# Patient Record
Sex: Male | Born: 1950 | Race: White | Hispanic: No | Marital: Married | Smoking: Never smoker
Health system: Southern US, Community
[De-identification: ages and names within clinical notes are randomized; demographics above are authoritative.]

## PROBLEM LIST (undated history)

## (undated) DIAGNOSIS — Z87442 Personal history of urinary calculi: Secondary | ICD-10-CM

## (undated) DIAGNOSIS — K219 Gastro-esophageal reflux disease without esophagitis: Secondary | ICD-10-CM

## (undated) DIAGNOSIS — B958 Unspecified staphylococcus as the cause of diseases classified elsewhere: Secondary | ICD-10-CM

## (undated) DIAGNOSIS — F32A Depression, unspecified: Secondary | ICD-10-CM

## (undated) DIAGNOSIS — C189 Malignant neoplasm of colon, unspecified: Secondary | ICD-10-CM

## (undated) DIAGNOSIS — T4145XA Adverse effect of unspecified anesthetic, initial encounter: Secondary | ICD-10-CM

## (undated) DIAGNOSIS — F329 Major depressive disorder, single episode, unspecified: Secondary | ICD-10-CM

## (undated) DIAGNOSIS — D649 Anemia, unspecified: Secondary | ICD-10-CM

## (undated) DIAGNOSIS — T8859XA Other complications of anesthesia, initial encounter: Secondary | ICD-10-CM

## (undated) DIAGNOSIS — N400 Enlarged prostate without lower urinary tract symptoms: Secondary | ICD-10-CM

## (undated) DIAGNOSIS — G4730 Sleep apnea, unspecified: Secondary | ICD-10-CM

## (undated) DIAGNOSIS — M1990 Unspecified osteoarthritis, unspecified site: Secondary | ICD-10-CM

## (undated) HISTORY — PX: CARDIAC CATHETERIZATION: SHX172

## (undated) HISTORY — DX: Unspecified staphylococcus as the cause of diseases classified elsewhere: B95.8

## (undated) HISTORY — PX: HERNIA REPAIR: SHX51

## (undated) HISTORY — PX: APPENDECTOMY: SHX54

## (undated) HISTORY — DX: Major depressive disorder, single episode, unspecified: F32.9

## (undated) HISTORY — PX: SKIN LESION EXCISION: SHX2412

## (undated) HISTORY — DX: Benign prostatic hyperplasia without lower urinary tract symptoms: N40.0

## (undated) HISTORY — DX: Depression, unspecified: F32.A

## (undated) HISTORY — PX: KIDNEY STONE SURGERY: SHX686

## (undated) HISTORY — DX: Sleep apnea, unspecified: G47.30

## (undated) HISTORY — DX: Malignant neoplasm of colon, unspecified: C18.9

---

## 1974-12-20 HISTORY — PX: PILONIDAL CYST / SINUS EXCISION: SUR543

## 2004-02-13 ENCOUNTER — Inpatient Hospital Stay (HOSPITAL_BASED_OUTPATIENT_CLINIC_OR_DEPARTMENT_OTHER): Admission: RE | Admit: 2004-02-13 | Discharge: 2004-02-13 | Payer: Self-pay | Admitting: Cardiology

## 2005-09-15 ENCOUNTER — Ambulatory Visit (HOSPITAL_COMMUNITY): Admission: RE | Admit: 2005-09-15 | Discharge: 2005-09-15 | Payer: Self-pay | Admitting: Unknown Physician Specialty

## 2005-12-20 HISTORY — PX: COLECTOMY: SHX59

## 2006-04-18 ENCOUNTER — Encounter (INDEPENDENT_AMBULATORY_CARE_PROVIDER_SITE_OTHER): Payer: Self-pay | Admitting: *Deleted

## 2006-04-18 ENCOUNTER — Ambulatory Visit: Payer: Self-pay | Admitting: Internal Medicine

## 2006-04-18 ENCOUNTER — Ambulatory Visit (HOSPITAL_COMMUNITY): Admission: RE | Admit: 2006-04-18 | Discharge: 2006-04-18 | Payer: Self-pay | Admitting: Internal Medicine

## 2006-04-19 ENCOUNTER — Encounter (INDEPENDENT_AMBULATORY_CARE_PROVIDER_SITE_OTHER): Payer: Self-pay | Admitting: *Deleted

## 2006-04-19 ENCOUNTER — Inpatient Hospital Stay (HOSPITAL_COMMUNITY): Admission: RE | Admit: 2006-04-19 | Discharge: 2006-04-24 | Payer: Self-pay | Admitting: General Surgery

## 2006-05-13 ENCOUNTER — Encounter (HOSPITAL_COMMUNITY): Admission: RE | Admit: 2006-05-13 | Discharge: 2006-06-12 | Payer: Self-pay | Admitting: Oncology

## 2006-05-13 ENCOUNTER — Ambulatory Visit (HOSPITAL_COMMUNITY): Payer: Self-pay | Admitting: Oncology

## 2006-05-13 ENCOUNTER — Encounter: Admission: RE | Admit: 2006-05-13 | Discharge: 2006-05-13 | Payer: Self-pay | Admitting: Oncology

## 2006-08-09 ENCOUNTER — Ambulatory Visit (HOSPITAL_COMMUNITY): Payer: Self-pay | Admitting: Oncology

## 2006-08-09 ENCOUNTER — Encounter: Admission: RE | Admit: 2006-08-09 | Discharge: 2006-08-09 | Payer: Self-pay | Admitting: Oncology

## 2006-08-09 ENCOUNTER — Encounter (HOSPITAL_COMMUNITY): Admission: RE | Admit: 2006-08-09 | Discharge: 2006-09-08 | Payer: Self-pay | Admitting: Oncology

## 2006-10-03 ENCOUNTER — Encounter: Admission: RE | Admit: 2006-10-03 | Discharge: 2006-10-03 | Payer: Self-pay | Admitting: Oncology

## 2006-10-03 ENCOUNTER — Encounter (HOSPITAL_COMMUNITY): Admission: RE | Admit: 2006-10-03 | Discharge: 2006-11-02 | Payer: Self-pay | Admitting: Oncology

## 2006-10-03 ENCOUNTER — Ambulatory Visit (HOSPITAL_COMMUNITY): Payer: Self-pay | Admitting: Oncology

## 2006-11-08 ENCOUNTER — Encounter (HOSPITAL_COMMUNITY): Admission: RE | Admit: 2006-11-08 | Discharge: 2006-12-08 | Payer: Self-pay | Admitting: Oncology

## 2006-11-25 ENCOUNTER — Ambulatory Visit (HOSPITAL_COMMUNITY): Payer: Self-pay | Admitting: Oncology

## 2006-12-20 DIAGNOSIS — B958 Unspecified staphylococcus as the cause of diseases classified elsewhere: Secondary | ICD-10-CM

## 2006-12-20 HISTORY — DX: Unspecified staphylococcus as the cause of diseases classified elsewhere: B95.8

## 2007-01-25 ENCOUNTER — Ambulatory Visit (HOSPITAL_COMMUNITY): Payer: Self-pay | Admitting: Oncology

## 2007-01-25 ENCOUNTER — Encounter (HOSPITAL_COMMUNITY): Admission: RE | Admit: 2007-01-25 | Discharge: 2007-02-24 | Payer: Self-pay | Admitting: Oncology

## 2007-05-03 ENCOUNTER — Encounter (INDEPENDENT_AMBULATORY_CARE_PROVIDER_SITE_OTHER): Payer: Self-pay | Admitting: Specialist

## 2007-05-03 ENCOUNTER — Ambulatory Visit: Payer: Self-pay | Admitting: Internal Medicine

## 2007-05-03 ENCOUNTER — Ambulatory Visit (HOSPITAL_COMMUNITY): Admission: RE | Admit: 2007-05-03 | Discharge: 2007-05-03 | Payer: Self-pay | Admitting: Internal Medicine

## 2007-05-18 ENCOUNTER — Encounter (HOSPITAL_COMMUNITY): Admission: RE | Admit: 2007-05-18 | Discharge: 2007-06-17 | Payer: Self-pay | Admitting: Oncology

## 2007-05-18 ENCOUNTER — Ambulatory Visit (HOSPITAL_COMMUNITY): Payer: Self-pay | Admitting: Oncology

## 2007-05-30 ENCOUNTER — Ambulatory Visit: Payer: Self-pay | Admitting: Internal Medicine

## 2007-08-23 ENCOUNTER — Ambulatory Visit (HOSPITAL_COMMUNITY): Payer: Self-pay | Admitting: Oncology

## 2007-08-23 ENCOUNTER — Encounter (HOSPITAL_COMMUNITY): Admission: RE | Admit: 2007-08-23 | Discharge: 2007-09-19 | Payer: Self-pay | Admitting: Oncology

## 2007-09-22 ENCOUNTER — Ambulatory Visit: Payer: Self-pay | Admitting: Gastroenterology

## 2007-10-04 ENCOUNTER — Ambulatory Visit (HOSPITAL_COMMUNITY): Admission: RE | Admit: 2007-10-04 | Discharge: 2007-10-04 | Payer: Self-pay | Admitting: Internal Medicine

## 2007-10-05 ENCOUNTER — Ambulatory Visit (HOSPITAL_COMMUNITY): Admission: RE | Admit: 2007-10-05 | Discharge: 2007-10-05 | Payer: Self-pay | Admitting: Internal Medicine

## 2007-10-10 ENCOUNTER — Ambulatory Visit (HOSPITAL_COMMUNITY): Admission: RE | Admit: 2007-10-10 | Discharge: 2007-10-10 | Payer: Self-pay | Admitting: Internal Medicine

## 2007-11-20 ENCOUNTER — Encounter (HOSPITAL_COMMUNITY): Admission: RE | Admit: 2007-11-20 | Discharge: 2007-12-20 | Payer: Self-pay | Admitting: Oncology

## 2007-11-20 ENCOUNTER — Ambulatory Visit (HOSPITAL_COMMUNITY): Payer: Self-pay | Admitting: Oncology

## 2007-12-07 ENCOUNTER — Ambulatory Visit: Payer: Self-pay | Admitting: Internal Medicine

## 2008-02-12 ENCOUNTER — Ambulatory Visit (HOSPITAL_COMMUNITY): Payer: Self-pay | Admitting: Oncology

## 2008-02-12 ENCOUNTER — Encounter (HOSPITAL_COMMUNITY): Admission: RE | Admit: 2008-02-12 | Discharge: 2008-03-13 | Payer: Self-pay | Admitting: Oncology

## 2008-04-29 ENCOUNTER — Ambulatory Visit (HOSPITAL_COMMUNITY): Payer: Self-pay | Admitting: Oncology

## 2008-04-29 ENCOUNTER — Encounter (HOSPITAL_COMMUNITY): Admission: RE | Admit: 2008-04-29 | Discharge: 2008-05-29 | Payer: Self-pay | Admitting: Oncology

## 2008-06-04 ENCOUNTER — Ambulatory Visit: Payer: Self-pay | Admitting: Internal Medicine

## 2008-07-24 ENCOUNTER — Ambulatory Visit: Payer: Self-pay | Admitting: Internal Medicine

## 2008-07-29 ENCOUNTER — Ambulatory Visit (HOSPITAL_COMMUNITY): Payer: Self-pay | Admitting: Oncology

## 2008-07-29 ENCOUNTER — Encounter (HOSPITAL_COMMUNITY): Admission: RE | Admit: 2008-07-29 | Discharge: 2008-08-28 | Payer: Self-pay | Admitting: Oncology

## 2008-11-20 ENCOUNTER — Encounter (HOSPITAL_COMMUNITY): Admission: RE | Admit: 2008-11-20 | Discharge: 2008-12-18 | Payer: Self-pay | Admitting: Oncology

## 2008-11-20 ENCOUNTER — Ambulatory Visit (HOSPITAL_COMMUNITY): Payer: Self-pay | Admitting: Oncology

## 2009-01-06 ENCOUNTER — Ambulatory Visit: Payer: Self-pay | Admitting: Internal Medicine

## 2009-03-27 ENCOUNTER — Encounter (HOSPITAL_COMMUNITY): Admission: RE | Admit: 2009-03-27 | Discharge: 2009-04-27 | Payer: Self-pay | Admitting: Oncology

## 2009-03-27 ENCOUNTER — Ambulatory Visit (HOSPITAL_COMMUNITY): Payer: Self-pay | Admitting: Oncology

## 2009-06-26 ENCOUNTER — Ambulatory Visit (HOSPITAL_COMMUNITY): Payer: Self-pay | Admitting: Oncology

## 2009-06-26 ENCOUNTER — Encounter (HOSPITAL_COMMUNITY): Admission: RE | Admit: 2009-06-26 | Discharge: 2009-07-26 | Payer: Self-pay | Admitting: Oncology

## 2009-08-14 DIAGNOSIS — E538 Deficiency of other specified B group vitamins: Secondary | ICD-10-CM

## 2009-08-14 DIAGNOSIS — E669 Obesity, unspecified: Secondary | ICD-10-CM

## 2009-08-14 DIAGNOSIS — C189 Malignant neoplasm of colon, unspecified: Secondary | ICD-10-CM

## 2009-08-15 ENCOUNTER — Ambulatory Visit: Payer: Self-pay | Admitting: Internal Medicine

## 2009-09-26 ENCOUNTER — Encounter (HOSPITAL_COMMUNITY): Admission: RE | Admit: 2009-09-26 | Discharge: 2009-10-26 | Payer: Self-pay | Admitting: Oncology

## 2009-09-26 ENCOUNTER — Ambulatory Visit (HOSPITAL_COMMUNITY): Payer: Self-pay | Admitting: Oncology

## 2009-11-19 ENCOUNTER — Ambulatory Visit (HOSPITAL_COMMUNITY): Payer: Self-pay | Admitting: Oncology

## 2009-11-19 ENCOUNTER — Encounter (HOSPITAL_COMMUNITY): Admission: RE | Admit: 2009-11-19 | Discharge: 2009-12-17 | Payer: Self-pay | Admitting: Oncology

## 2009-12-05 ENCOUNTER — Encounter: Payer: Self-pay | Admitting: Internal Medicine

## 2009-12-29 ENCOUNTER — Encounter (HOSPITAL_COMMUNITY): Admission: RE | Admit: 2009-12-29 | Discharge: 2010-01-28 | Payer: Self-pay | Admitting: Oncology

## 2010-01-01 ENCOUNTER — Encounter: Payer: Self-pay | Admitting: Internal Medicine

## 2010-01-07 ENCOUNTER — Ambulatory Visit (HOSPITAL_COMMUNITY): Payer: Self-pay | Admitting: Psychology

## 2010-04-17 ENCOUNTER — Ambulatory Visit: Payer: Self-pay | Admitting: Internal Medicine

## 2010-04-20 DIAGNOSIS — Z8719 Personal history of other diseases of the digestive system: Secondary | ICD-10-CM

## 2010-04-21 ENCOUNTER — Encounter: Payer: Self-pay | Admitting: Internal Medicine

## 2010-04-23 ENCOUNTER — Encounter: Payer: Self-pay | Admitting: Internal Medicine

## 2010-05-11 ENCOUNTER — Ambulatory Visit: Payer: Self-pay | Admitting: Internal Medicine

## 2010-05-11 ENCOUNTER — Ambulatory Visit (HOSPITAL_COMMUNITY): Admission: RE | Admit: 2010-05-11 | Discharge: 2010-05-11 | Payer: Self-pay | Admitting: Internal Medicine

## 2010-05-11 HISTORY — PX: COLONOSCOPY: SHX174

## 2010-05-13 ENCOUNTER — Encounter: Payer: Self-pay | Admitting: Internal Medicine

## 2010-06-16 ENCOUNTER — Telehealth (INDEPENDENT_AMBULATORY_CARE_PROVIDER_SITE_OTHER): Payer: Self-pay

## 2010-08-18 ENCOUNTER — Ambulatory Visit (HOSPITAL_COMMUNITY): Payer: Self-pay | Admitting: Oncology

## 2010-08-18 ENCOUNTER — Encounter (HOSPITAL_COMMUNITY): Admission: RE | Admit: 2010-08-18 | Discharge: 2010-09-17 | Payer: Self-pay | Admitting: Oncology

## 2010-11-18 ENCOUNTER — Ambulatory Visit (HOSPITAL_COMMUNITY): Payer: Self-pay | Admitting: Oncology

## 2010-11-18 ENCOUNTER — Encounter (HOSPITAL_COMMUNITY)
Admission: RE | Admit: 2010-11-18 | Discharge: 2010-12-18 | Payer: Self-pay | Source: Home / Self Care | Attending: Oncology | Admitting: Oncology

## 2010-12-03 ENCOUNTER — Encounter: Payer: Self-pay | Admitting: Internal Medicine

## 2011-01-19 NOTE — Letter (Signed)
Summary: Internal Other /TCS order/instructions  Internal Other /TCS order/instructions   Imported By: Cloria Spring LPN 16/09/9603 54:09:81  _____________________________________________________________________  External Attachment:    Type:   Image     Comment:   External Document

## 2011-01-19 NOTE — Letter (Signed)
Summary: [facility]   Imported By: Diana Eves 01/01/2010 10:15:55  _____________________________________________________________________  External Attachment:    Type:   Image     Comment:   External Document

## 2011-01-19 NOTE — Progress Notes (Signed)
----   Converted from flag ---- ---- 06/16/2010 1:55 PM, [name] wrote: Pt called stating that he had a CEA done at the time of his TCS in May and he never recieved the results- He would like to be called with those- he can be reached at [phone] ------------------------------  Appended Document:  see cea labs addendum

## 2011-01-19 NOTE — Miscellaneous (Signed)
Summary: Orders Update  Clinical Lists Changes  Orders: Added new Test order of T-CEA [phone]) - Signed

## 2011-01-19 NOTE — Assessment & Plan Note (Signed)
Summary: ov to set up colonoscopy in apr per RMR/ams   Visit Type:  Follow-up Visit Primary Care Provider:  [name]  Chief Complaint:  Time for TCS.  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 Last TCS 2008.  He is due for surveillance exam this time. He has done well; he's had a little bllod per re tum on occasion from time to time attributable to hemorrhoids. He is having worsening of his reflux symptoms lately having some nocturnal regurgitation; no dysphagia or dysphagia no nausea or vomiting. There's been no significant tobacco or alcohol exposure along the way. He's never had  his upper GI tract evaluated. He is not on any acid suppression therapy at this time; he has gained a good 10 pounds in the past year or so and he is obese at 224 pounds.  I have discussed the importance for three-year followup colonoscopy at this time.   Current Medications (verified): 1)  Xanax 0.5 Mg Tabs (Alprazolam) .... Once Daily 2)  Ambien 10 Mg Tabs (Zolpidem Tartrate) .... As Needed 3)  Cymbalta 30 Mg Cpep (Duloxetine Hcl) .... Take 1 Tablet By Mouth Once A Day  Allergies (verified): 1)  ! * Dilaudid  Past History:  Family History: Last updated: 09/10/2009 Father:deceased- lung cancer ,dm Mother: living-dm Siblings: half brothers  Social History: Last updated: September 10, 2009 Marital Status: Married Children: 1 Occupation: school system Patient has never smoked.  Alcohol Use - no  Past Medical History: Hypertension Sleep Apnea Anxiety Depression  Past Surgical History: UMBILICAL HERNIORRHAPHY Pylenylo cyst    1970's  Vital Signs:  Patient profile:   60 year old male Height:      69 inches Weight:      224.50 pounds BMI:     33.27 Temp:     97.9 degrees F oral Pulse rate:   72 / minute BP sitting:   130 / 80  (left arm) Cuff size:   large  Vitals Entered By: Cloria Spring LPN (April 17, 2010 3:46  PM)  Physical Exam  General:  pleasant gentleman in no acute distress Eyes:  no scleral icterus. Conjunctiva are pink Lungs:  clear to auscultation Heart:  regular rate rhythm without murmur gallop rub Abdomen:  obese positive bowel sounds soft nontender without appreciable mass or organomegaly Rectal:  deferred until time of colonoscopy  Impression & Recommendations: Impression: Pleasant [age]-year-old gentleman with a history stage II colon cancer status post right hemicolectomy 2007. Negative followup exam in 2008. Now with some intermittent low volume hematochezia which is likely from a benign anorectal source. At any rate, he is due for a colonoscopy at this time.  As a separate issue, he some reflux symptoms recently in the setting of obesity. No alarm features.  Recommendations: Surveillance colonoscopy in the near future risks, benefits, limitations, alternatives and imponderables have been reviewed prescriptions were answered he is agreeable.  GERD we'll give him a three-week supply of Dexalant 60 mg orally daily to be taken before breakfast. I provided literature on gastroesophageal reflux disease. We'll see how he responds to this regimen prior to making further recommendations.  At the patient's request, he requests his next CEA be drawn at the hospital the day of colonoscopy. I will see that this gets done.  Appended Document: Orders Update    Clinical Lists Changes  Problems: Added new problem of GASTROESOPHAGEAL REFLUX DISEASE, HX OF (ICD-V12.79) Orders: Added new Service order of Est.  Patient Level IV (96295) - Signed

## 2011-01-19 NOTE — Letter (Signed)
Summary: Patient Notice, Colon Biopsy Results  [facility]  [address]   [place], [place] [address]   Phone: [phone]  Fax: [phone]       May 13, 2010   [name] [address] [place], [place]  [address] Oct 23, 1951    Dear [name],  I am pleased to inform you that the biopsies taken during your recent colonoscopy did not show any evidence of cancer upon pathologic examination.  Additional information/recommendations:  No further action is needed at this time.  Please follow-up with your primary care physician for your other healthcare needs.  You should have a repeat colonoscopy examination  in 5 years.  Please call us if you are having persistent problems or have questions about your condition that have not been fully answered at this time.  Sincerely,    [name] MD, FACP [facility] Ph: [phone]    Fax: [phone]   Appended Document: Patient Notice, Colon Biopsy Results mailed letter to pt  Appended Document: Patient Notice, Colon Biopsy Results reminder in computer

## 2011-01-21 NOTE — Letter (Signed)
Summary: [facility]  [facility]   Imported By: Rexene Alberts 12/03/2010 08:50:07  _____________________________________________________________________  External Attachment:    Type:   Image     Comment:   External Document

## 2011-02-16 ENCOUNTER — Other Ambulatory Visit (HOSPITAL_COMMUNITY): Payer: BC Managed Care – PPO

## 2011-02-16 ENCOUNTER — Encounter (HOSPITAL_COMMUNITY): Payer: BC Managed Care – PPO | Attending: Oncology

## 2011-02-16 DIAGNOSIS — C189 Malignant neoplasm of colon, unspecified: Secondary | ICD-10-CM

## 2011-02-16 DIAGNOSIS — Z85038 Personal history of other malignant neoplasm of large intestine: Secondary | ICD-10-CM | POA: Insufficient documentation

## 2011-03-02 LAB — COMPREHENSIVE METABOLIC PANEL
ALT: 55 U/L — ABNORMAL HIGH (ref 0–53)
AST: 37 U/L (ref 0–37)
BUN: 10 mg/dL (ref 6–23)
CO2: 27 mEq/L (ref 19–32)
Calcium: 9.5 mg/dL (ref 8.4–10.5)
Chloride: 106 mEq/L (ref 96–112)
GFR calc Af Amer: >60 mL/min (ref >60)
Glucose, Bld: 87 mg/dL (ref 70–99)
Potassium: 3.8 mEq/L (ref 3.5–5.1)
Sodium: 140 mEq/L (ref 135–145)

## 2011-03-02 LAB — CBC
Hemoglobin: 15.5 g/dL (ref 13.0–17.0)
Platelets: 211 10*3/uL (ref 150–400)

## 2011-03-02 LAB — DIFFERENTIAL
Basophils Absolute: 0 10*3/uL (ref 0.0–0.1)
Basophils Relative: 0 % (ref 0–1)
Eosinophils Absolute: 0.3 10*3/uL (ref 0.0–0.7)
Eosinophils Relative: 3 % (ref 0–5)
Lymphocytes Relative: 20 % (ref 12–46)
Lymphs Abs: 1.7 10*3/uL (ref 0.7–4.0)
Monocytes Absolute: 0.8 10*3/uL (ref 0.1–1.0)

## 2011-03-05 LAB — CEA: CEA: 1.2 ng/mL (ref 0.0–5.0)

## 2011-03-23 LAB — COMPREHENSIVE METABOLIC PANEL
ALT: 50 U/L (ref 0–53)
Alkaline Phosphatase: 96 U/L (ref 39–117)
BUN: 12 mg/dL (ref 6–23)
CO2: 28 mEq/L (ref 19–32)
Chloride: 106 mEq/L (ref 96–112)
Glucose, Bld: 127 mg/dL — ABNORMAL HIGH (ref 70–99)
Potassium: 3.9 mEq/L (ref 3.5–5.1)
Total Bilirubin: 0.6 mg/dL (ref 0.3–1.2)

## 2011-03-23 LAB — INTRINSIC FACTOR ANTIBODIES: Intrinsic Factor: NEGATIVE

## 2011-03-23 LAB — FERRITIN: Ferritin: 132 ng/mL (ref 22–322)

## 2011-03-23 LAB — CBC
MCHC: 34.8 g/dL (ref 30.0–36.0)
Platelets: 183 10*3/uL (ref 150–400)
RBC: 4.99 MIL/uL (ref 4.22–5.81)
WBC: 6.9 10*3/uL (ref 4.0–10.5)

## 2011-03-23 LAB — VITAMIN B12: Vitamin B-12: 286 pg/mL (ref 211–911)

## 2011-05-04 NOTE — Assessment & Plan Note (Signed)
[name], [name]                 CHART#:  [id number]   DATE:  12/07/2007                       DOB:  [birth year]   Followup stage 2 carcinoma of the colon, status post right hemicolectomy  in 2007, essentially negative colonoscopy in May of this year; however,  he did have a small anastomotic ulcer.  He had some mild changes of  colitis which had been noted on prior exams.  He is overall doing well.  He saw Dr. Mariel Sleet recently.  His CEA was noted to be up at 1.5 but  still well within normal limits.  He is quite anxious about the  followup, which is pending.  He had stage 2 disease.  We did try to  image his small bowel further with a Gibbon's capsule passing the agile  or dummy capsule first.  On serial plain films, it appeared to have hung  up, and we elected not to do the capsule.  We did a CT enterography,  which demonstrated no evidence of enteritis or other abnormality.  He  has 1 to 2 bowel movements daily, occasionally has some abdominal  cramps, certainly not passing any blood per rectum.  He has gained 10  pounds since October.   CURRENT MEDICATIONS:  See updated list.  He takes milk thistle to  cleanse his liver and NK  killer to boost his immune system.   ALLERGIES:  No known drug allergies.   PHYSICAL EXAMINATION:  GENERAL:  On exam today, he looks well.  VITAL SIGNS:  Weight 214.  Height 5'9.  Temperature 98.2, blood  pressure 110/78, pulse 64.  SKIN:  Warm and dry.  CHEST:  Lungs are clear to auscultation.  CARDIAC:  Regular rate and rhythm without murmur, gallop or rub.  ABDOMEN:  Nondistended.  Positive bowel sounds.  Soft, nontender.  No  appreciable mass or organomegaly.  EXTREMITIES:  No edema.   ASSESSMENT:  Stage 2 ascending colon cancer, status post right  hemicolectomy.  Followup colonoscopy reassuring as far as recurrent  neoplasm is concerned.  This gentleman may have early subclinical  inflammatory bowel disease, but I am certainly not ready to  give him a  diagnosis of such at this time.  We will keep close tabs on his bowel  function.  He was reassured about the relative increase in his CEA.  I  encouraged him to follow up with Dr.  Mariel Sleet and get some exercise, hold the line on weight.  Unless  something comes up, plan to see [name] back in 6 months.  He will  need a repeat colonoscopy in May of 2011.       [name], M.D.  Electronically Signed     RMR/MEDQ  D:  12/07/2007  T:  12/07/2007  Job:  [id number]   cc:   Keene Breath. Mariel Sleet, MD

## 2011-05-04 NOTE — Assessment & Plan Note (Signed)
[name], [name]                 CHART#:  [id number]   DATE:  05/30/2007                       DOB:  19-Jul-1951   FOLLOWUP:  History of stage II cecal carcinoma status post resection 1  year ago.  On May 03, 2007, he underwent colonoscopy with biopsy and was  found to have a prior right hemicolectomy and a 3 mm ulceration at the  anastomosis and other areas of erosion friability.  Biopsies were taken.  There was no evidence of recurrent neoplasm, although there was  a  dimunitive rectal polyp, which was cold biopsied/removed.  This turned  out to be hyperplastic; he had some mild reactive changes on the other  biopsies.  He has had a tendency towards loose stools over a period of  years, but says since he had his cecum removed he is having really no  more than 1 to 3 bowel movements daily and more recently 1 bowel  movement daily. It starts out somewhat firm and is soft to somewhat  loose by the end of the bowel movement.  He certainly is not having any  rectal bleeding, tenesmus, no abdominal pain.  He does have some back  pain from time to time.  He recently saw Dr. Mariel Sleet, he tells me his  CEA was 0.5 and he is not due for followup with him for several months.  He is not getting any adjuvant therapy at this point in time.  There is  no family history of inflammatory bowel disease.  He is not taking any  nonsteroidal agents.   CURRENT MEDICATIONS:  See updated list.   ALLERGIES:  No known drug allergies.   EXAM:  Today he looks good, weight 206, height 5 foot 9, temp 98.4, BP  140/90, pulse 64.  SKIN:  Warm and dry.  There is no jaundice.  ABDOMEN:  Well healed surgical scar.  Positive bowel sounds, soft.  Has  minimal right upper quadrant tenderness to palpation.  No appreciable  mass or hepatosplenomegaly.   ASSESSMENT:  History stage 2 colon cancer, status post resection.  No  evidence of recurrent neoplasia on recent colonoscopy.  He did have  changes consistent with  colitis.  This was seen on the colonoscopy 1  year ago at the time of cancer diagnosis and Dr. Cleotis Nipper made mention  of these findings on a prior colonoscopy a few years back.  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  I discussed with Mr.  [name] the possibility at some point of doing a Verda Cumins small bowel  capsule study just to look for ileitis, but at this point he is  essentially asymptomatic and I have recommended nothing more  than a fiber supplement with Benefiber 1 tablespoon daily. We will plan  to see him back in 6 months and go from there.  If things change in the  interim he is to let me know.       [name], M.D.  Electronically Signed     RMR/MEDQ  D:  05/30/2007  T:  05/31/2007  Job:  [id number]   cc:   [name], MD  [name]

## 2011-05-04 NOTE — Assessment & Plan Note (Signed)
[name], COSTABILE                 CHART#:  [id number]   DATE:  01/06/2009                       DOB:  October 21, 1951   CHIEF COMPLAINT:  Followup of colon cancer.   SUBJECTIVE:  The patient is here for a followup visit.  He has a history  of stage II carcinoma of the colon, status post right hemicolectomy in  2007.  His last colonoscopy was in May 2008, was normal except for small  anastomotic ulcer and some mild changes of colitis, which had also been  seen on prior exams.  There has been a concern for possibility of  subclinical inflammatory bowel disease, although there has not been  enough information to confirm diagnosis.  The patient continues to do  very well.  He states he has about 2 stools a day, and initially stools  are formed but then loose towards the end.  He only sees bright red  blood per rectum if he has a problem with his hemorrhoid coming out.  He  denies any abdominal pain except for occasional cramping associated with  bowel movements.  His weight is up 6 pounds.  He admittedly has not been  very good with his diet.  He denies any nausea, vomiting, or heartburn.  He had a CT of the abdomen and pelvis in December 2009, which was  negative for any metastatic or recurrent cancer.  His last CEA level in  August 2009 was 0.9.   CURRENT MEDICATIONS:  See updated list.   ALLERGIES:  Dilaudid.   PHYSICAL EXAMINATION:  VITAL SIGNS:  Weight 227, up 6 pounds; temp 98.2,  blood pressure 122/80, and pulse 72.  GENERAL:  A pleasant well-nourished, well-developed Caucasian gentleman  in no acute distress.  SKIN:  Warm and dry.  No jaundice.  HEENT:  Sclerae nonicteric.  Oropharyngeal mucosa moist and pink.  CHEST:  Lungs are clear to auscultation.  CARDIAC:  Regular rate and rhythm.  No murmurs.  ABDOMEN:  Positive bowel sounds.  Abdomen is soft, nontender, and  nondistended.  No organomegaly or masses.  No rebound or guarding.  LOWER EXTREMITIES:  No edema.   IMPRESSION:   Stage II ascending colon cancer, status post right  hemicolectomy in 2007.  There has been a questionable subclinical  inflammatory bowel disease history, although not enough information to  confirm this diagnosis.  Clinically, he appears to be doing well.  He  does have continued occasional intermittent rectal bleeding felt to be  due to his hemorrhoids.  The patient has expressed his worry of having  recurrent cancer as well as questionable inflammatory bowel disease.   PLAN:  I will discuss further with Dr. Jena Gauss.  At this point in time, we  have recommended followup colonoscopy in May 2011.  However, we will  schedule for sooner if recommended by Dr. Jena Gauss.  Further  recommendations to follow.   Addendum:  0/25/10  Per Dr. Jena Gauss, recommend TCS Jan 2011.  OV with Dr.  Jena Gauss in 6 months.       Tana Coast, P.A.  Electronically Signed     [name], M.D.  Electronically Signed    LL/MEDQ  D:  01/06/2009  T:  01/06/2009  Job:  [id number]   cc:   Keene Breath. Mariel Sleet, MD

## 2011-05-04 NOTE — Assessment & Plan Note (Signed)
NAMELINTON, STOLP                 CHART#:  [id number]   DATE:  06/04/2008                       DOB:  1951/09/19   CHIEF COMPLAINT:  Followup of colon cancer.   SUBJECTIVE:  The patient is a 60 year old gentleman with history of  stage II carcinoma of the colon, status post right hemicolectomy in  2007.  He had a negative colonoscopy in May 2008, except for a small  anastomotic ulcer.  He had some mild changes of colitis, which have been  noted on prior exams.  Overall, he has been doing well.  He states that  CEA had noted to be as high as 1.5, had dropped down to around 0.8, and  now back up to 1.2.  Overall, he feels well.  His bowel movements are  regular.  He has had occasional bright red blood per rectum, which he  feels is related to his hemorrhoids.  He could feel his hemorrhoids come  out at times.  He denies any abdominal pain.  He had some irritation  around the anorectal opening and thought he had a second opening there.  He went to see his PCP, and on exam, he was told that everything looked  fine except for maybe a small tear.  Denies any heartburn, nausea, or  vomiting.  No weight loss.   CURRENT MEDICATIONS:  See admitted list.   ALLERGIES:  No known drug allergies.   PHYSICAL EXAMINATION:  VITALS:  Weight 221 which is up 7 pounds,  temperature 98.8, blood pressure 138/98, and pulse 64.  GENERAL:  Pleasant, well-nourished, well-developed Caucasian male in no  acute distress.  SKIN:  Warm and dry.  No jaundice.  HEENT:  Sclerae nonicteric.  CHEST:  Lungs are clear to auscultation.  CARDIAC:  Regular rate and rhythm.  Normal S1 and S2.  No murmurs, rubs,  or gallops.  ABDOMEN:  Positive bowel sounds.  Soft, nontender, and nondistended.  No  organomegaly or masses.  No rebound or guarding.  RECTAL:  No abnormalities externally.  He has a well-healed scar from  prior pilonidal cyst surgery.  Skin tag noted at 6 o'clock.  No masses  in the rectal vault.  Secretions  are heme negative.  Exam nontender.  LOWER EXTREMITIES:  No edema.   IMPRESSION:  The patient is a 61 year old gentleman with history of  stage II ascending colon cancer, status post right hemicolectomy.  Followup colonoscopy later was unremarkable for recurrent neoplasm.  There has been a questionable subclinical inflammatory bowel disease  history, although there has not been enough confirmation to give him  that diagnosis.  He has done very well from a GI standpoint in the last  6 months.  He does have some intermittent rectal bleeding possibly due  to benign anorectal source.  He has a small skin tag noted on rectal  exam today and possibly had a prior fissure.  No evidence of fistula.   PLAN:  1. Stool hemoccult x3 with no evidence of obvious GI bleeding.  2. We will discuss further with Dr. Jena Gauss.  3. Office visit in 6 months.       Tana Coast, P.A.  Electronically Signed     [name], M.D.  Electronically Signed    LL/MEDQ  D:  06/04/2008  T:  06/05/2008  Job:  [id number]   cc:   Keene Breath Mariel Sleet, MD

## 2011-05-04 NOTE — Assessment & Plan Note (Signed)
[name], [name]                 CHART#:  [id number]   DATE:  09/22/2007                       DOB:  02-29-1951   CHIEF COMPLAINT:  Abdominal pain, diarrhea.   SUBJECTIVE:  The patient is here for further evaluation of right lower  quadrant abdominal pain and diarrhea. He has a history of stage 2  adenocarcinoma of the ascending colon.  He underwent right hemicolectomy  May 2007.  His last colonoscopy was May 2008, at which time he had  dimunitive rectal polyps, subtle granularity of the rectum and sigmoid  colon.  The right colon was pale with submucosal petechiae.  There was  friability and erosions and 1 ulcer at the surgical anastomosis.  The  small bowel could not be intubated at the site of anastomosis secondary  to a sharp turn.  Dr. Jena Gauss felt the patient has underlying IBS.   He states he has had increased pain primarily in the right abdomen.  He  describes crampy-type pain sometimes when he has bowel movements, other  times he does not have any bowel movements with it.  Symptoms have been  worse since August of 2008.  He notes the symptoms have been worse since  he has been back in school.  He denies any melena or rectal bleeding.  His weight has been stable.  His appetite is good.  He had a CT of the  chest, abdomen and pelvis 07/11/07, which apparently was done for  elevated LFTs and right-sided chest and abdominal pain. He had a 3-mm,  nonobstructing, right intrarenal calculus and tiny left renal cyst.  He  had lower lumbar spine facet DJD.  The liver and gallbladder appear to  be normal.   CURRENT MEDICATIONS:  1. Vitamin C. Daily.  2. Multivitamin daily.  3. Sleeping pill at bedtime.  4. Fiber supplement q.a.m.   ALLERGIES:  No known drug allergies.   PHYSICAL EXAMINATION:  VITAL SIGNS: Weight 204, height 5 feet 9 inches,  temp 98.1, blood pressure 120/70, pulse 72.  GENERAL: Pleasant, well-nourished, well-developed Caucasian male in no  acute distress.  SKIN: Warm and dry, no jaundice.  ABDOMEN: Soft, positive bowel sounds. He has a well-healed surgical  scar. He has mild tenderness throughout the epigastrium and right mid to  lower abdomen.  No appreciable mass, hepatosplenomegaly or hernias.  EXTREMITIES: No edema.   IMPRESSION:  History of stage 2 colon cancer, status post resection.  He  has complaints of abdominal cramping and a couple of loose stools daily.  He has right-sided abdominal pain.  He has had changes consistent with  colitis on several biopsies in the past.  He may have underlying  inflammatory bowel disease.  We discussed small bowel capsule endoscopy  today.  I will address this further with Dr. Jena Gauss given that he was  unable to intubate the anastomosis.  We need to consider this prior to  administering small bowel capsule.   After the patient left, his CT report from [facility] arrived and  apparently he had an indication of elevated LFTs for that study.  I do  not have any recent labs in his chart.  Will try to obtain a copy of  those labs.  His symptoms did not sound biliary in etiology.  He does  have some mild elevation of LFTs  dating back to 1997, in our chart.   PLAN:  1. Retrieve recent labs from Dr. Jeannette How office.  2. Vicodin 5/500 1 orally q. 6 hours as needed for abdominal pain, #20      with 0 refills.  3. Will discuss further with Dr. Jena Gauss regarding possible small bowel      Givens capsule endoscopy.   Further recommendations to follow.  The patient is aware that he should  contact a physician if he has worsening abdominal pain, worsening  diarrhea or blood in the stools, fever or chills.       Tana Coast, P.A.  Electronically Signed     Kassie Mends, M.D.  Electronically Signed    LL/MEDQ  D:  09/22/2007  T:  09/22/2007  Job:  [id number]   cc:   [name]

## 2011-05-07 NOTE — Discharge Summary (Signed)
[name], [name]                ACCOUNT NO.:  [id number]   MEDICAL RECORD NO.:  [id number]          PATIENT TYPE:  INP   LOCATION:  A339                          FACILITY:  [facility]   PHYSICIAN:  [name], M.D.  DATE OF BIRTH:  07/19/1951   DATE OF ADMISSION:  04/19/2006  DATE OF DISCHARGE:  05/06/2007LH                                 DISCHARGE SUMMARY   HOSPITAL COURSE:  Summary. The patient is a 60 year old white male who was  found on colonoscopy by [name], M.D. to have an ascending colon  carcinoma.  He underwent preoperative CT scan which was negative for any  liver metastasis.  He underwent a right hemicolectomy on 04/19/2006.  Tolerated the procedure well.  Postoperative course was remarkable for one  episode of near apnea secondary to Dilaudid.  He was switched to Demerol and  this seemed to do well for him.  Does have a history of sleep apnea.  His  diet was advanced without difficulty once his bowel function returned.  Final pathology revealed a T3, N0, M0 intermediate grade adenocarcinoma of  the colon.  Preoperative CEA level was 1.2.  19 lymph nodes were noted to be  negative for cancer.   The patient is being discharged home on 04/24/2006 in good improving  condition.   DISCHARGE INSTRUCTIONS:  The patient is to follow up with Dr. Franky Macho  on 04/26/2006.   DISCHARGE MEDICATIONS:  1.  Darvocet N 100 1-2 tablets p.o. q.4 h p.r.n. pain.  2.  He is to resume the Xanax as previously prescribed.   PRINCIPAL DIAGNOSES:  1.  T3, N0, M0 adenocarcinoma of the ascending colon.  2.  History of sleep apnea.   PRINCIPAL PROCEDURE:  Right hemicolectomy on 04/19/2006.      [name], M.D.  Electronically Signed     MAJ/MEDQ  D:  04/24/2006  T:  04/25/2006  Job:  [id number]   cc:   [name]  Fax: [phone]   [name], M.D.  [address]  [place]  [place] [address]

## 2011-05-07 NOTE — H&P (Signed)
NAMESMARAN, GAUS                ACCOUNT NO.:  [id number]   MEDICAL RECORD NO.:  [id number]          PATIENT TYPE:  AMB   LOCATION:                                FACILITY:  [facility]   PHYSICIAN:  [name], M.D.  DATE OF BIRTH:  1951/07/22   DATE OF ADMISSION:  DATE OF DISCHARGE:  LH                                HISTORY & PHYSICAL   AGE:  60 years old   CHIEF COMPLAINT:  Colon neoplasm, behavior unspecified.   HISTORY OF PRESENT ILLNESS:  The patient is a 60 year old white male who  today underwent a colonoscopy by Dr. Jena Gauss of gastroenterology for anemia  and was found to have an apple core lesion in the right colon in the cecal  region.  He apparently had a colonoscopy several years ago which was  reportedly unremarkable.  He had this repeat colonoscopy due to anemia.  The  patient denies any nausea, vomiting, diarrhea, or constipation.  Past  medical history is unremarkable.   PAST SURGICAL HISTORY:  Umbilical herniorrhaphy in the past.   CURRENT MEDICATIONS:  None.   ALLERGIES:  No known drug allergies.   REVIEW OF SYSTEMS:  Noncontributory.   PHYSICAL EXAMINATION:  GENERAL:  The patient is a well-developed, well-  nourished white male in no acute distress.  LUNGS:  Clear to auscultation with equal breath sounds bilaterally.  HEART:  Reveals a regular rate and rhythm without S3, S4, or murmurs.  ABDOMEN:  Soft, nontender, nondistended.  No hepatosplenomegaly or masses  noted.  No rigidity is noted.   Labs, chest x-ray, CT scan of the abdomen, and 12-lead EKG are all pending.   IMPRESSION:  Right colon neoplasm, unspecified.   PLAN:  The patient is scheduled for a partial colectomy with terminal ileum  removal on Apr 19, 2006.  The risks and benefits of the procedure including  bleeding, infection, cardiopulmonary difficulties, and the possibility of a  blood transfusion were fully explained to the patient, who gave informed  consent.      [name],  M.D.  Electronically Signed     MAJ/MEDQ  D:  04/18/2006  T:  04/18/2006  Job:  [id number]   cc:   [name]  Fax: [phone]   [name], M.D.  [address]  [place]  [place] [address]

## 2011-05-07 NOTE — Cardiovascular Report (Signed)
NAME:  [name], [name]                          ACCOUNT NO.:  [id number]   MEDICAL RECORD NO.:  [id number]                   PATIENT TYPE:  OIB   LOCATION:  6501                                 FACILITY:  [facility]   PHYSICIAN:  Salvadore Farber, M.D.             DATE OF BIRTH:  April 02, 1951   DATE OF PROCEDURE:  02/13/2004  DATE OF DISCHARGE:                              CARDIAC CATHETERIZATION   PROCEDURE:  Left and right heart catheterization, left ventriculography,  coronary angiography.   INDICATION:  Mr. Knick is a 60 year old gentleman without prior history of  cardiovascular disease.  He has had a numb sensation in his chest prompting  Dr. Dimas Aguas to obtain an exercise Cardiolite.  The patient had excellent  exercise tolerance and no diagnostic EKG changes.  Cardiolite images  reportedly demonstrated moderate size inferior defect with partial  reversibility and an apical defect with partial reversibility.  Question was  also raised of enlargement of the right ventricle.  He was therefore  scheduled for right and left heart catheterization with coronary angiography  to exclude myocardial ischemia and ASD.   PROCEDURAL TECHNIQUE:  Informed consent was obtained.  Under 1% lidocaine  local anesthesia, a 7 French sheath was placed in the right vein and a 4  French sheath in the right femoral artery using the modified Seldinger  technique.  Right heart catheterization was performed using a balloon-tip  catheter.  Cardiac output was calculated using the Fick method.  Left heart  catheterization and ventriculography were then performed by power injection.  Finally, coronary angiography was performed using JL-4 and JR-4 catheters.   COMPLICATIONS:  None.   FINDINGS:  1. Hemodynamics:  RA 5/5/3, RV 23/0/6, PA 21/5/12, PCW 8/9/6, LV 109/1/7,     aorta 103/66/83.  2. No aortic stenosis or mitral stenosis.  3. Saturations:  SVC 68, IVC 72, PA 73, aorta 95.  4. Cardiac output/index (Fick):   Cardiac output 5.4/index 2.6.  5. Ventriculography:  EF 65% with moderate inferior hypokinesis.  No mitral     regurgitation.   CORONARY ANGIOGRAPHY:  1. Left main:  Angiographically normal.  2. LAD:  The LAD is a large vessel which wraps the apex of the heart to     supply the distal third of the inferior wall.  It gives rise to two     diagonal branches.  It is angiographically normal.  3. Circumflex:  Large vessel giving rise to three obtuse marginals.  It is     angiographically normal.  4. RCA:  Relatively small though dominant vessel.  It is angiographically     normal.   IMPRESSION/RECOMMENDATIONS:  1. Angiographically normal coronary arteries.  2. Normal overall left ventricular systolic function.  However, there is a     mild inferior wall motion abnormality.  Cannot exclude a prior myocardial     infarction.  However, I think this unlikely in the absence  of any     angiographically evident coronary artery disease.  3. Normal right and left heart pressures.  4. No evidence of atrial septal defect.   The patient was provided with reassurance.                                               Salvadore Farber, M.D.    WED/MEDQ  D:  02/13/2004  T:  02/14/2004  Job:  [id number]   cc:   [name]  [address]  [name]  [place] [address]  Fax: [phone]

## 2011-05-07 NOTE — Op Note (Signed)
NAMESTEPHANE, [name]                ACCOUNT NO.:  [id number]   MEDICAL RECORD NO.:  [id number]          PATIENT TYPE:  AMB   LOCATION:  DAY                           FACILITY:  [facility]   PHYSICIAN:  [name], M.D. DATE OF BIRTH:  [birth year]   DATE OF PROCEDURE:  05/03/2007  DATE OF DISCHARGE:                               OPERATIVE REPORT   PROCEDURE:  Colonoscopy with biopsies.   INDICATIONS FOR PROCEDURE:  The patient is a 60 year old gentleman who  was found to have a large cecal carcinoma one year ago at colonoscopy by  me status post resection by Dr. Lovell [name].  Found have stage-II disease.  Has seen Dr. Mariel Sleet.  He did not undergo any adjuvant chemotherapy.   It was also noted he had some nonspecific changes consistent with  colitis on prior colonoscopy (apparently he also was noted to have  changes consistent with colitis on incomplete colonoscopy by Dr. Elmyra Ricks on a prior colonoscopy).  Currently he is doing well.  He has  not had any rectal bleeding.  He has not had really any abdominal pain.  He is eating well.  He has 1 to 3 formed bowel movements daily.  Colonoscopy is now being done as a surveillance maneuver.  This approach  has been discussed with the patient at length.  Potential risks,  benefits and alternatives have been reviewed and questions answered.  Please see documentation in the medical record.   PROCEDURE NOTE:  O2 saturation, blood pressure and pulse oximetry were  monitored throughout the entire procedure.   CONSCIOUS SEDATION:  Versed 4 mg IV and Demerol 75 mg IV in divided  doses.   INSTRUMENT:  Pentax video chip system.   FINDINGS:  Digital rectal exam revealed no abnormalities.   ENDOSCOPIC FINDINGS:  The prep was adequate.  The colonic mucosa was  surveyed from the rectosigmoid junction through the left, transverse and  right colon to the area of anastomosis with small bowel.  In the area of  the right colon there was a pale  appearance to the mucosa with  submucosal petechiae.  In the area directly around the surgical  anastomosis there was a 3-mm ulcer and multiple other areas of erosion  and friability.  The mucosa was markedly friable in  this area.  The  anastomosis appeared to be patent but I could not intubate it very for  because of an acute turn the small bowel took.  The small bowel mucosa  that was seen appeared normal.  From this level, the scope was slowly  withdrawn.  All previously mentioned mucosal surfaces were again seen.  Down in the sigmoid colon there was also some granularity, but there  were no erosions, ulcerations or evidence of recurrent neoplasm or polyp  in the colon.  The scope was pulled down to the rectum where there were  some granular changes of the mucosa.  On retroflexion there appeared to  be 2- to 3-mm diminutive polyps just inside the anal verge.   The ulcer at the anastomosis and the adjacent abnormal-appearing mucosa  were biopsied for histologic study.  Also the sigmoid mucosa was  biopsied separately.  Finally the diminutive polyps in the rectum were  cold biopsied/removed.  The patient tolerated the procedure well, was  reactive after endoscopy.   IMPRESSION:  1. Diminutive rectal polyps, status post cold biopsy removal.  2. Subtle granularity of the rectum of uncertain significance,      otherwise normal rectum.  3. Some granularity of the sigmoid colon.  The mucosa in the right      colon was pale with submucosal petechiae.  There was friability,      erosions and one ulcer at the surgical anastomosis with small      bowel,  biopsied.  The intervening colonic mucosa between the right      colon and sigmoid appeared normal.   I suspect this gentleman has underlying inflammatory bowel disease.  Indeterminate colitis versus Crohn's disease remains in the differential  at this time.   RECOMMENDATIONS:  1. Will follow up on pathology.  2. Further recommendations  to follow.      [name], M.D.  Electronically Signed     RMR/MEDQ  D:  05/03/2007  T:  05/03/2007  Job:  [id number]   cc:   [name]  Fax: [phone]   [name], M.D.  Fax: [phone]   [name], M.D.  Fax: [phone]   [name], MD  Fax: [phone]

## 2011-05-11 ENCOUNTER — Other Ambulatory Visit (HOSPITAL_COMMUNITY): Payer: Self-pay | Admitting: Oncology

## 2011-05-11 ENCOUNTER — Encounter (HOSPITAL_COMMUNITY): Payer: BC Managed Care – PPO | Attending: Oncology

## 2011-05-11 DIAGNOSIS — C189 Malignant neoplasm of colon, unspecified: Secondary | ICD-10-CM

## 2011-05-11 DIAGNOSIS — Z85038 Personal history of other malignant neoplasm of large intestine: Secondary | ICD-10-CM | POA: Insufficient documentation

## 2011-08-03 ENCOUNTER — Encounter (HOSPITAL_COMMUNITY): Payer: BC Managed Care – PPO | Attending: Oncology

## 2011-08-03 ENCOUNTER — Telehealth (HOSPITAL_COMMUNITY): Payer: Self-pay | Admitting: *Deleted

## 2011-08-03 DIAGNOSIS — C189 Malignant neoplasm of colon, unspecified: Secondary | ICD-10-CM | POA: Insufficient documentation

## 2011-08-03 NOTE — Telephone Encounter (Signed)
Needs Rx for b12 injections sent to [facility] in [place]

## 2011-08-03 NOTE — Progress Notes (Signed)
[name] presented for labwork. Labs per MD order drawn via Peripheral Line 25 gauge needle inserted in rt arm   Procedure without incident.  Needle removed intact. Patient tolerated procedure well.

## 2011-08-04 ENCOUNTER — Encounter (HOSPITAL_COMMUNITY): Payer: Self-pay | Admitting: Oncology

## 2011-08-04 LAB — CEA: CEA: 1.4 ng/mL (ref 0.0–5.0)

## 2011-08-04 NOTE — Telephone Encounter (Signed)
Refill times 12 mo

## 2011-09-10 LAB — CEA: CEA: <0.5

## 2011-09-17 LAB — CEA: CEA: 0.9

## 2011-09-24 LAB — BASIC METABOLIC PANEL
BUN: 20 mg/dL (ref 6–23)
Calcium: 9.1 mg/dL (ref 8.4–10.5)
GFR calc non Af Amer: >60 mL/min (ref >60)
Potassium: 3.9 mEq/L (ref 3.5–5.1)
Sodium: 138 mEq/L (ref 135–145)

## 2011-09-27 LAB — CBC
HCT: 43.3
Hemoglobin: 15
Platelets: 195
RDW: 13.5
WBC: 5.1

## 2011-09-27 LAB — FERRITIN: Ferritin: 60 (ref 22–322)

## 2011-10-01 LAB — CEA: CEA: 0.6

## 2011-10-01 LAB — FERRITIN: Ferritin: 35 (ref 22–322)

## 2011-11-16 ENCOUNTER — Encounter (HOSPITAL_COMMUNITY): Payer: BC Managed Care – PPO | Attending: Oncology

## 2011-11-16 DIAGNOSIS — E538 Deficiency of other specified B group vitamins: Secondary | ICD-10-CM | POA: Insufficient documentation

## 2011-11-16 DIAGNOSIS — Z85038 Personal history of other malignant neoplasm of large intestine: Secondary | ICD-10-CM | POA: Insufficient documentation

## 2011-11-16 DIAGNOSIS — Z09 Encounter for follow-up examination after completed treatment for conditions other than malignant neoplasm: Secondary | ICD-10-CM | POA: Insufficient documentation

## 2011-11-16 DIAGNOSIS — C189 Malignant neoplasm of colon, unspecified: Secondary | ICD-10-CM

## 2011-11-16 DIAGNOSIS — E669 Obesity, unspecified: Secondary | ICD-10-CM | POA: Insufficient documentation

## 2011-11-16 DIAGNOSIS — L408 Other psoriasis: Secondary | ICD-10-CM | POA: Insufficient documentation

## 2011-11-16 DIAGNOSIS — G4730 Sleep apnea, unspecified: Secondary | ICD-10-CM | POA: Insufficient documentation

## 2011-11-16 DIAGNOSIS — E119 Type 2 diabetes mellitus without complications: Secondary | ICD-10-CM | POA: Insufficient documentation

## 2011-11-16 DIAGNOSIS — F319 Bipolar disorder, unspecified: Secondary | ICD-10-CM | POA: Insufficient documentation

## 2011-11-16 LAB — DIFFERENTIAL
Basophils Absolute: 0 10*3/uL (ref 0.0–0.1)
Basophils Relative: 0 % (ref 0–1)
Eosinophils Absolute: 0.2 10*3/uL (ref 0.0–0.7)
Monocytes Relative: 8 % (ref 3–12)
Neutro Abs: 4.4 10*3/uL (ref 1.7–7.7)
Neutrophils Relative %: 65 % (ref 43–77)

## 2011-11-16 LAB — COMPREHENSIVE METABOLIC PANEL
ALT: 75 U/L — ABNORMAL HIGH (ref 0–53)
AST: 47 U/L — ABNORMAL HIGH (ref 0–37)
Albumin: 3.8 g/dL (ref 3.5–5.2)
Alkaline Phosphatase: 108 U/L (ref 39–117)
Calcium: 9.6 mg/dL (ref 8.4–10.5)
GFR calc Af Amer: 89 mL/min — ABNORMAL LOW (ref >90)
Glucose, Bld: 181 mg/dL — ABNORMAL HIGH (ref 70–99)
Potassium: 3.6 mEq/L (ref 3.5–5.1)
Sodium: 139 mEq/L (ref 135–145)
Total Protein: 7.4 g/dL (ref 6.0–8.3)

## 2011-11-16 LAB — CBC
Hemoglobin: 13.6 g/dL (ref 13.0–17.0)
MCH: 29.8 pg (ref 26.0–34.0)
MCHC: 33.4 g/dL (ref 30.0–36.0)
Platelets: 203 10*3/uL (ref 150–400)
RDW: 13.3 % (ref 11.5–15.5)

## 2011-11-16 NOTE — Progress Notes (Signed)
[name] presented for labwork. Labs per MD order drawn via Peripheral Line 23 gauge needle inserted in left hand  Good blood return present. Procedure without incident.  Needle removed intact. Patient tolerated procedure well.

## 2011-11-17 ENCOUNTER — Encounter (HOSPITAL_COMMUNITY): Payer: Self-pay | Admitting: Oncology

## 2011-11-17 ENCOUNTER — Encounter (HOSPITAL_BASED_OUTPATIENT_CLINIC_OR_DEPARTMENT_OTHER): Payer: BC Managed Care – PPO | Admitting: Oncology

## 2011-11-17 ENCOUNTER — Telehealth (HOSPITAL_COMMUNITY): Payer: Self-pay

## 2011-11-17 VITALS — BP 167/93 | HR 78 | Temp 94.5°F | Ht 68.5 in | Wt 248.0 lb

## 2011-11-17 DIAGNOSIS — F3130 Bipolar disorder, current episode depressed, mild or moderate severity, unspecified: Secondary | ICD-10-CM

## 2011-11-17 DIAGNOSIS — E538 Deficiency of other specified B group vitamins: Secondary | ICD-10-CM

## 2011-11-17 DIAGNOSIS — E669 Obesity, unspecified: Secondary | ICD-10-CM

## 2011-11-17 DIAGNOSIS — C189 Malignant neoplasm of colon, unspecified: Secondary | ICD-10-CM

## 2011-11-17 DIAGNOSIS — C182 Malignant neoplasm of ascending colon: Secondary | ICD-10-CM

## 2011-11-17 LAB — CEA: CEA: 1.1 ng/mL (ref 0.0–5.0)

## 2011-11-17 NOTE — Patient Instructions (Signed)
[name]  [id number] 09/02/1951 [facility]  Discharge Instructions  RECOMMENDATIONS MADE BY THE CONSULTANT AND ANY TEST RESULTS WILL BE SENT TO YOUR REFERRING DOCTOR.   EXAM FINDINGS BY MD TODAY AND SIGNS AND SYMPTOMS TO REPORT TO CLINIC OR PRIMARY MD: You are doing well, labs were good.  Need to have CT scans after Christmas.  MEDICATIONS PRESCRIBED: none   INSTRUCTIONS GIVEN AND DISCUSSED: Other : Report any changes in bowel habits, blood in stool, shortness of breath, etc.  SPECIAL INSTRUCTIONS/FOLLOW-UP: Lab work Needed in 6 months and in 12 months, Xray Studies Needed after Christmas and Return to Clinic in 1 year after labs   I acknowledge that I have been informed and understand all the instructions given to me and received a copy. I do not have any more questions at this time, but understand that I may call the Specialty Clinic at [facility] at [phone] during business hours should I have any further questions or need assistance in obtaining follow-up care.    __________________________________________  _____________  __________ Signature of Patient or Authorized Representative            Date                   Time    __________________________________________ Nurse's Signature

## 2011-11-17 NOTE — Progress Notes (Signed)
This office note has been dictated.

## 2011-11-17 NOTE — Progress Notes (Signed)
CC:   Robert Flavin, MD [name], MD FACP [name], M.D.  DIAGNOSES: 1. 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  He had a potential to participate in a     protocol, declined it, and he, of course, probably made the right     decision and remains disease free. 2. Obesity weighing 248 pounds now, up 20 pounds more in a year. 3. Depression but on therapy for his bipolar disorder. 4. B12 deficiency, but with negative parietal and intrinsic factor     antibodies, but he is on B12 and needs to stay on that. 5. New onset diabetes mellitus. 6. Colonoscopy in May 2011.  He will be redone in 2016. 7. Sleep apnea syndrome.  HISTORY:  [name] unfortunately is still, I think, mildly-to-moderately depressed because of social issues.  His son, of course, is in a wheelchair, cannot communicate very well except through the computer, and he has, of course, a tough time with this.  His work is still tough at times in school.  REVIEW OF SYSTEMS:  Oncologic is negative.  PHYSICAL EXAMINATION:  General:  His exam otherwise except for the weight is not different.  Vital Signs:  Blood pressure a little high today at 167/93, pulse 80 and regular, respirations 18 to 22. Lungs: His lungs are clear though.  Heart:  Shows a very soft grade 1/6 systolic murmur, as I mentioned, very soft without an S3 gallop. Abdomen:  His liver is not palpable.  Spleen is not palpable.  Bowel sounds are diminished but present.  Skin/Extremities:  He has no peripheral edema.  He does have changes on his skin of psoriasis in multiple places.  He just finished seeing the PA at Dr. Reginia Forts office for a checkup.  He has multiple lesions in his arms, forearms, upper arms, knees above and below the knees, that is.  He has a lesion on the right shoulder which is a little hyperkeratotic.  So I have  asked him to continue seeing the dermatologist.  I do not see any lesions myself that are strictly cancerous.  PLAN:  We will see him back in 6 months for a CEA only.  We will get his last CT of the abdomen and pelvis this year for followup and then will hold on those things.  We will see him in 12 months with a CBC, C-Met, and CEA at that time.  We will do the CEAs every 6 months for 2 years, starting now.  After that, we will probably just see him once a year for a couple years and then release him if all is well.  I hope that he can lose some weight at some point.  He snacks quite a bit during the day.    ______________________________ [name], MD ESN/MEDQ  D:  11/17/2011  T:  11/17/2011  Job:  [id number]

## 2011-11-17 NOTE — Telephone Encounter (Signed)
Error, MD has talked with patient.

## 2011-12-16 ENCOUNTER — Ambulatory Visit (HOSPITAL_COMMUNITY): Payer: BC Managed Care – PPO

## 2011-12-16 ENCOUNTER — Ambulatory Visit (HOSPITAL_COMMUNITY)
Admission: RE | Admit: 2011-12-16 | Discharge: 2011-12-16 | Disposition: A | Discharge status: Home / Self Care | Payer: BC Managed Care – PPO | Source: Ambulatory Visit | Attending: Oncology | Admitting: Oncology

## 2011-12-16 DIAGNOSIS — R160 Hepatomegaly, not elsewhere classified: Secondary | ICD-10-CM | POA: Insufficient documentation

## 2011-12-16 DIAGNOSIS — R918 Other nonspecific abnormal finding of lung field: Secondary | ICD-10-CM | POA: Insufficient documentation

## 2011-12-16 DIAGNOSIS — Z9049 Acquired absence of other specified parts of digestive tract: Secondary | ICD-10-CM | POA: Insufficient documentation

## 2011-12-16 DIAGNOSIS — K7689 Other specified diseases of liver: Secondary | ICD-10-CM | POA: Insufficient documentation

## 2011-12-16 DIAGNOSIS — C189 Malignant neoplasm of colon, unspecified: Secondary | ICD-10-CM

## 2011-12-16 DIAGNOSIS — E119 Type 2 diabetes mellitus without complications: Secondary | ICD-10-CM | POA: Insufficient documentation

## 2011-12-16 DIAGNOSIS — Z85038 Personal history of other malignant neoplasm of large intestine: Secondary | ICD-10-CM | POA: Insufficient documentation

## 2011-12-16 MED ORDER — IOHEXOL 300 MG/ML  SOLN
100.0000 mL | Freq: Once | INTRAMUSCULAR | Status: AC | PRN
  Administered 2011-12-16: 100 mL via INTRAVENOUS

## 2011-12-28 NOTE — H&P (Signed)
NTS SOAP Note  Vital Signs:  Vitals as of: 12/28/2011: Systolic 150: Diastolic 84: Heart Rate 85: Temp 96.4F: Height 72ft 9in: Weight 244Lbs 0 Ounces: Pain Level 7: BMI 36  BMI : 36.03 kg/m2  Subjective: This 83 Years 4 Months old Male presents for of  HEMORRHOIDS: ,Has had problems with hemorrhoids for years, but recently they have become swollen and painful.  Creams have not been helpful.  Review of Symptoms:  Constitutional:unremarkable Head:unremarkable Eyes:unremarkable Nose/Mouth/Throat:unremarkable Cardiovascular:unremarkable Respiratory:unremarkable Gastrointestinheartburn Genitourinary:urinary hesitancy Musculoskeletal:unremarkable boils Hematolgic/Lymphatic:unremarkable Allergic/Immunologic:unremarkable   Past Medical History:Reviewed   Past Medical History  Surgical History: linidal cyst, partial colectomy kidney stones Medical Problems:  Cancer, Diabetes Psychiatric History:  Anxiety, Depression Allergies: dilaudid Medications: lithium, lamotrigine, metformin, amytriptyline   Social History:Reviewed   Social History  Preferred Language: English ([place]) Race:  White Ethnicity: Not Hispanic / Latino Age: 67 Years 8 Months Marital Status:  M Alcohol:  No Recreational drug(s):  No   Smoking Status: Never smoker reviewed on 12/28/2011  Family History:Reviewed   Family History  Is there a family history ZO:[id number]    Objective Information: General:Well appearing, well nourished in no distress. Head:Atraumatic; no masses; no abnormalities Heart:RRR, no murmur or gallop.  Normal S1, S2.  No S3, S4.  Lungs:CTA bilaterally, no wheezes, rhonchi, rales.  Breathing unlabored. Abdomen:Soft, NT/ND, no HSM, no masses. Large protruding hemorrhoids circumferentially, with thrombosis noted on left side, exam limited secondary to pain.  Assessment:Thrombosed hemorrhoids  Diagnosis  &amp; Procedure: DiagnosisCode: 455.4, ProcedureCode: 09811,    Plan:  Scheduled for hemorrhoidectomy on 12/31/11.  He realizes that I will not be able to completely cure him of hemorrhoidal disease.   Patient Education:Alternative treatments to surgery were discussed with patient (and family).Risks and benefits  of procedure were fully explained to the patient (and family) who gave informed consent. Patient/family questions were addressed.  Follow-up:Pending Surgery

## 2011-12-29 ENCOUNTER — Encounter (HOSPITAL_COMMUNITY)
Admission: RE | Admit: 2011-12-29 | Discharge: 2011-12-29 | Disposition: A | Discharge status: Home / Self Care | Payer: BC Managed Care – PPO | Source: Ambulatory Visit | Attending: General Surgery | Admitting: General Surgery

## 2011-12-29 ENCOUNTER — Other Ambulatory Visit: Payer: Self-pay

## 2011-12-29 ENCOUNTER — Encounter (HOSPITAL_COMMUNITY): Payer: Self-pay | Admitting: Pharmacy Technician

## 2011-12-29 ENCOUNTER — Encounter (HOSPITAL_COMMUNITY): Payer: Self-pay

## 2011-12-29 LAB — CBC
HCT: 41.8 % (ref 39.0–52.0)
MCHC: 35.6 g/dL (ref 30.0–36.0)
Platelets: 198 10*3/uL (ref 150–400)
RDW: 13 % (ref 11.5–15.5)

## 2011-12-29 LAB — SURGICAL PCR SCREEN
MRSA, PCR: NEGATIVE
Staphylococcus aureus: POSITIVE — AB

## 2011-12-29 LAB — DIFFERENTIAL
Basophils Absolute: 0 10*3/uL (ref 0.0–0.1)
Basophils Relative: 0 % (ref 0–1)
Monocytes Absolute: 0.8 10*3/uL (ref 0.1–1.0)
Neutro Abs: 4.2 10*3/uL (ref 1.7–7.7)

## 2011-12-29 NOTE — Patient Instructions (Addendum)
20 [name]  12/29/2011   Your procedure is scheduled on:  12/31/2011  [facility] to [facility] at  [place]  AM.  Call this number if you have problems the morning of surgery: [phone]   Remember:   Do not eat food:After Midnight.  May have clear liquids:until Midnight .  Clear liquids include soda, tea, black coffee, apple or grape juice, broth.  Take these medicines the morning of surgery with A SIP OF WATER: Xanax, Lamictal, and Lithobid.   Do not wear jewelry, make-up or nail polish.  Do not wear lotions, powders, or perfumes. You may wear deodorant.  Do not shave 48 hours prior to surgery.  Do not bring valuables to the hospital.  Contacts, dentures or bridgework may not be worn into surgery.  Leave suitcase in the car. After surgery it may be brought to your room.  For patients admitted to the hospital, checkout time is 11:00 AM the day of discharge.   Patients discharged the day of surgery will not be allowed to drive home.  Name and phone number of your driver: family  Special Instructions: CHG Shower Use Special Wash: 1/2 bottle night before surgery and 1/2 bottle morning of surgery.   Please read over the following fact sheets that you were given: Pain Booklet, MRSA Information, Surgical Site Infection Prevention, Anesthesia Post-op Instructions and Care and Recovery After Surgery    Hemorrhoidectomy Hemorrhoidectomy is surgery to remove hemorrhoids. Hemorrhoids are veins that have become swollen in the rectum. The rectum is the area from the bottom end of the intestines to the opening where bowel movements leave the body. Hemorrhoids can be uncomfortable. They can cause itching, bleeding and pain if a blood clot forms in them (thrombose). If hemorrhoids are small, surgery may not be needed. But if they cover a larger area, surgery is usually suggested.  LET YOUR CAREGIVER KNOW ABOUT:   Any allergies.   All medications you are taking, including:   Herbs, eyedrops,  over-the-counter medications and creams.   Blood thinners (anticoagulants), aspirin or other drugs that could affect blood clotting.   Use of steroids (by mouth or as creams).   Previous problems with anesthetics, including local anesthetics.   Possibility of pregnancy, if this applies.   Any history of blood clots.   Any history of bleeding or other blood problems.   Previous surgery.   Smoking history.   Other health problems.  RISKS AND COMPLICATIONS All surgery carries some risk. However, hemorrhoid surgery usually goes smoothly. Possible complications could include:  Urinary retention.   Bleeding.   Infection.   A painful incision.   A reaction to the anesthesia (this is not common).  BEFORE THE PROCEDURE   Stop using aspirin and non-steroidal anti-inflammatory drugs (NSAIDs) for pain relief. This includes prescription drugs and over-the-counter drugs such as ibuprofen and naproxen. Also stop taking vitamin E. If possible, do this two weeks before your surgery.   If you take blood-thinners, ask your healthcare provider when you should stop taking them.   You will probably have blood and urine tests done several days before your surgery.   Do not eat or drink for about 8 hours before the surgery.   Arrive at least an hour before the surgery, or whenever your surgeon recommends. This will give you time to check in and fill out any needed paperwork.   Hemorrhoidectomy is often an outpatient procedure. This means you will be able to go home the same day. Sometimes,  though, people stay overnight in the hospital after the procedure. Ask your surgeon what to expect. Either way, make arrangements in advance for someone to drive you home.  PROCEDURE   The preparation:   You will change into a hospital gown.   You will be given an IV. A needle will be inserted in your arm. Medication can flow directly into your body through this needle.   You might be given an enema to  clear your rectum.   Once in the operating room, you will probably lie on your side or be repositioned later to lying on your stomach.   You will be given anesthesia (medication) so you will not feel anything during the surgery. The surgery often is done with local anesthesia (the area near the hemorrhoids will be numb and you will be drowsy but awake). Sometimes, general anesthesia is used (you will be asleep during the procedure).   The procedure:   There are a few different procedures for hemorrhoids. Be sure to ask you surgeon about the procedure, the risks and benefits.   Be sure to ask about what you need to do to take care of the wound, if there is one.  AFTER THE PROCEDURE  You will stay in a recovery area until the anesthesia has worn off. Your blood pressure and pulse will be checked every so often.   You may feel a lot of pain in the area of the rectum.   Take all pain medication prescribed by your surgeon. Ask before taking any over-the-counter pain medicines.   Sometimes sitting in a warm bath can help relieve your pain.   To make sure you have bowel movements without straining:   You will probably need to take stool softeners (usually a pill) for a few days.   You should drink 8 to 10 glasses of water each day.   Your activity will be restricted for awhile. Ask your caregiver for a list of what you should and should not do while you recover.  Document Released: 10/03/2009 Document Revised: 08/18/2011 Document Reviewed: 10/03/2009 [facility] Patient Information 2012 [place], [place].  PATIENT INSTRUCTIONS POST-ANESTHESIA  IMMEDIATELY FOLLOWING SURGERY:  Do not drive or operate machinery for the first twenty four hours after surgery.  Do not make any important decisions for twenty four hours after surgery or while taking narcotic pain medications or sedatives.  If you develop intractable nausea and vomiting or a severe headache please notify your doctor  immediately.  FOLLOW-UP:  Please make an appointment with your surgeon as instructed. You do not need to follow up with anesthesia unless specifically instructed to do so.  WOUND CARE INSTRUCTIONS (if applicable):  Keep a dry clean dressing on the anesthesia/puncture wound site if there is drainage.  Once the wound has quit draining you may leave it open to air.  Generally you should leave the bandage intact for twenty four hours unless there is drainage.  If the epidural site drains for more than 36-48 hours please call the anesthesia department.  QUESTIONS?:  Please feel free to call your physician or the hospital operator if you have any questions, and they will be happy to assist you.     [facility] Anesthesia Department [address] [place] [phone]

## 2011-12-31 ENCOUNTER — Ambulatory Visit (HOSPITAL_COMMUNITY)
Admission: RE | Admit: 2011-12-31 | Discharge: 2011-12-31 | Disposition: A | Discharge status: Home / Self Care | Payer: BC Managed Care – PPO | Source: Ambulatory Visit | Attending: General Surgery | Admitting: General Surgery

## 2011-12-31 ENCOUNTER — Ambulatory Visit (HOSPITAL_COMMUNITY): Payer: BC Managed Care – PPO | Admitting: Anesthesiology

## 2011-12-31 ENCOUNTER — Encounter (HOSPITAL_COMMUNITY)
Admission: RE | Disposition: A | Discharge status: Home / Self Care | Payer: Self-pay | Source: Ambulatory Visit | Attending: General Surgery

## 2011-12-31 ENCOUNTER — Encounter (HOSPITAL_COMMUNITY): Payer: Self-pay | Admitting: *Deleted

## 2011-12-31 ENCOUNTER — Encounter (HOSPITAL_COMMUNITY): Payer: Self-pay | Admitting: Anesthesiology

## 2011-12-31 ENCOUNTER — Other Ambulatory Visit: Payer: Self-pay | Admitting: General Surgery

## 2011-12-31 DIAGNOSIS — K645 Perianal venous thrombosis: Secondary | ICD-10-CM | POA: Insufficient documentation

## 2011-12-31 DIAGNOSIS — Z01812 Encounter for preprocedural laboratory examination: Secondary | ICD-10-CM | POA: Insufficient documentation

## 2011-12-31 DIAGNOSIS — Z01810 Encounter for preprocedural cardiovascular examination: Secondary | ICD-10-CM | POA: Insufficient documentation

## 2011-12-31 DIAGNOSIS — E119 Type 2 diabetes mellitus without complications: Secondary | ICD-10-CM | POA: Insufficient documentation

## 2011-12-31 HISTORY — PX: HEMORRHOID SURGERY: SHX153

## 2011-12-31 HISTORY — DX: Other complications of anesthesia, initial encounter: T88.59XA

## 2011-12-31 HISTORY — DX: Adverse effect of unspecified anesthetic, initial encounter: T41.45XA

## 2011-12-31 LAB — POCT I-STAT 4, (NA,K, GLUC, HGB,HCT)
Glucose, Bld: 97 mg/dL (ref 70–99)
HCT: 41 % (ref 39.0–52.0)
Hemoglobin: 13.9 g/dL (ref 13.0–17.0)

## 2011-12-31 SURGERY — HEMORRHOIDECTOMY
Anesthesia: Spinal | Site: Anus | Wound class: Clean Contaminated

## 2011-12-31 MED ORDER — GLYCOPYRROLATE 0.2 MG/ML IJ SOLN
INTRAMUSCULAR | Status: AC
  Administered 2011-12-31: 0.2 mg via INTRAVENOUS
  Filled 2011-12-31: qty 1

## 2011-12-31 MED ORDER — EPHEDRINE SULFATE 50 MG/ML IJ SOLN
INTRAMUSCULAR | Status: AC
  Filled 2011-12-31: qty 1

## 2011-12-31 MED ORDER — SODIUM CHLORIDE 0.9 % IV SOLN
1.0000 g | INTRAVENOUS | Status: DC | PRN
  Administered 2011-12-31: 1 g via INTRAVENOUS

## 2011-12-31 MED ORDER — BUPIVACAINE IN DEXTROSE 0.75-8.25 % IT SOLN
INTRATHECAL | Status: AC
  Filled 2011-12-31: qty 2

## 2011-12-31 MED ORDER — ENOXAPARIN SODIUM 40 MG/0.4ML ~~LOC~~ SOLN
SUBCUTANEOUS | Status: AC
  Filled 2011-12-31: qty 0.4

## 2011-12-31 MED ORDER — KETOROLAC TROMETHAMINE 30 MG/ML IJ SOLN
INTRAMUSCULAR | Status: AC
  Administered 2011-12-31: 30 mg via INTRAVENOUS
  Filled 2011-12-31: qty 1

## 2011-12-31 MED ORDER — ONDANSETRON HCL 4 MG/2ML IJ SOLN: 4.0000 mg | Freq: Once | INTRAMUSCULAR | Status: DC | PRN

## 2011-12-31 MED ORDER — LIDOCAINE VISCOUS 2 % MT SOLN
OROMUCOSAL | Status: DC | PRN
  Administered 2011-12-31: 20 mL via OROMUCOSAL

## 2011-12-31 MED ORDER — FENTANYL CITRATE 0.05 MG/ML IJ SOLN
INTRAMUSCULAR | Status: DC | PRN
  Administered 2011-12-31: 25 ug via INTRAVENOUS

## 2011-12-31 MED ORDER — PROPOFOL 10 MG/ML IV EMUL
INTRAVENOUS | Status: AC
  Filled 2011-12-31: qty 20

## 2011-12-31 MED ORDER — KETOROLAC TROMETHAMINE 30 MG/ML IJ SOLN
30.0000 mg | Freq: Once | INTRAMUSCULAR | Status: AC
  Administered 2011-12-31: 30 mg via INTRAVENOUS

## 2011-12-31 MED ORDER — BUPIVACAINE HCL (PF) 0.5 % IJ SOLN
INTRAMUSCULAR | Status: AC
  Filled 2011-12-31: qty 30

## 2011-12-31 MED ORDER — ENOXAPARIN SODIUM 40 MG/0.4ML ~~LOC~~ SOLN
40.0000 mg | Freq: Once | SUBCUTANEOUS | Status: AC
  Administered 2011-12-31: 40 mg via SUBCUTANEOUS

## 2011-12-31 MED ORDER — MIDAZOLAM HCL 2 MG/2ML IJ SOLN
INTRAMUSCULAR | Status: AC
  Filled 2011-12-31: qty 2

## 2011-12-31 MED ORDER — ONDANSETRON HCL 4 MG/2ML IJ SOLN
INTRAMUSCULAR | Status: AC
  Administered 2011-12-31: 4 mg via INTRAVENOUS
  Filled 2011-12-31: qty 2

## 2011-12-31 MED ORDER — BUPIVACAINE HCL (PF) 0.5 % IJ SOLN
INTRAMUSCULAR | Status: DC | PRN
  Administered 2011-12-31: 9 mL

## 2011-12-31 MED ORDER — SODIUM CHLORIDE 0.9 % IV SOLN
INTRAVENOUS | Status: AC
  Filled 2011-12-31: qty 1

## 2011-12-31 MED ORDER — SODIUM CHLORIDE 0.9 % IV SOLN: 1.0000 g | INTRAVENOUS | Status: DC

## 2011-12-31 MED ORDER — ONDANSETRON HCL 4 MG/2ML IJ SOLN
4.0000 mg | Freq: Once | INTRAMUSCULAR | Status: AC
  Administered 2011-12-31: 4 mg via INTRAVENOUS

## 2011-12-31 MED ORDER — FENTANYL CITRATE 0.05 MG/ML IJ SOLN: 25.0000 ug | INTRAMUSCULAR | Status: DC | PRN

## 2011-12-31 MED ORDER — SODIUM CHLORIDE 0.9 % IR SOLN
Status: DC | PRN
  Administered 2011-12-31: 1000 mL

## 2011-12-31 MED ORDER — OXYCODONE-ACETAMINOPHEN 7.5-325 MG PO TABS: 1.0000 | ORAL_TABLET | ORAL | Status: DC | PRN

## 2011-12-31 MED ORDER — LIDOCAINE HCL (PF) 1 % IJ SOLN
INTRAMUSCULAR | Status: AC
  Filled 2011-12-31: qty 2

## 2011-12-31 MED ORDER — GLYCOPYRROLATE 0.2 MG/ML IJ SOLN
0.2000 mg | Freq: Once | INTRAMUSCULAR | Status: AC | PRN
  Administered 2011-12-31: 0.2 mg via INTRAVENOUS

## 2011-12-31 MED ORDER — MIDAZOLAM HCL 5 MG/5ML IJ SOLN
INTRAMUSCULAR | Status: DC | PRN
  Administered 2011-12-31: 2 mg via INTRAVENOUS

## 2011-12-31 MED ORDER — MIDAZOLAM HCL 2 MG/2ML IJ SOLN
1.0000 mg | INTRAMUSCULAR | Status: DC | PRN
  Administered 2011-12-31: 2 mg via INTRAVENOUS

## 2011-12-31 MED ORDER — LACTATED RINGERS IV SOLN
INTRAVENOUS | Status: DC
  Administered 2011-12-31: 09:00:00 via INTRAVENOUS

## 2011-12-31 MED ORDER — MIDAZOLAM HCL 2 MG/2ML IJ SOLN
INTRAMUSCULAR | Status: AC
  Administered 2011-12-31: 2 mg via INTRAVENOUS
  Filled 2011-12-31: qty 2

## 2011-12-31 MED ORDER — BUPIVACAINE IN DEXTROSE 0.75-8.25 % IT SOLN
INTRATHECAL | Status: DC | PRN
  Administered 2011-12-31: 10 mg via INTRATHECAL

## 2011-12-31 MED ORDER — LIDOCAINE VISCOUS 2 % MT SOLN
OROMUCOSAL | Status: AC
  Filled 2011-12-31: qty 15

## 2011-12-31 MED ORDER — PROPOFOL 10 MG/ML IV EMUL
INTRAVENOUS | Status: DC | PRN
  Administered 2011-12-31: 25 ug/kg/min via INTRAVENOUS

## 2011-12-31 MED ORDER — FENTANYL CITRATE 0.05 MG/ML IJ SOLN
INTRAMUSCULAR | Status: AC
  Filled 2011-12-31: qty 2

## 2011-12-31 MED ORDER — ACETAMINOPHEN 325 MG PO TABS: 325.0000 mg | ORAL_TABLET | ORAL | Status: DC | PRN

## 2011-12-31 SURGICAL SUPPLY — 27 items
BAG HAMPER (MISCELLANEOUS) ×2 IMPLANT
CLOTH BEACON ORANGE TIMEOUT ST (SAFETY) ×2 IMPLANT
COVER LIGHT HANDLE STERIS (MISCELLANEOUS) ×4 IMPLANT
DECANTER SPIKE VIAL GLASS SM (MISCELLANEOUS) ×2 IMPLANT
DRAPE PROXIMA HALF (DRAPES) ×2 IMPLANT
ELECT REM PT RETURN 9FT ADLT (ELECTROSURGICAL) ×2
ELECTRODE REM PT RTRN 9FT ADLT (ELECTROSURGICAL) ×1 IMPLANT
FORMALIN 10 PREFIL 120ML (MISCELLANEOUS) ×2 IMPLANT
GLOVE BIO SURGEON STRL SZ7.5 (GLOVE) ×2 IMPLANT
GLOVE ECLIPSE 6.5 STRL STRAW (GLOVE) ×2 IMPLANT
GLOVE EXAM NITRILE MD LF STRL (GLOVE) ×2 IMPLANT
GLOVE INDICATOR 7.0 STRL GRN (GLOVE) ×2 IMPLANT
GOWN STRL REIN XL XLG (GOWN DISPOSABLE) ×6 IMPLANT
HEMOSTAT SURGICEL 4X8 (HEMOSTASIS) ×2 IMPLANT
KIT ROOM TURNOVER AP CYSTO (KITS) ×2 IMPLANT
LIGASURE IMPACT 36 18CM CVD LR (INSTRUMENTS) ×2 IMPLANT
MANIFOLD NEPTUNE II (INSTRUMENTS) ×2 IMPLANT
NEEDLE HYPO 25X1 1.5 SAFETY (NEEDLE) ×2 IMPLANT
NS IRRIG 1000ML POUR BTL (IV SOLUTION) ×2 IMPLANT
PACK PERI GYN (CUSTOM PROCEDURE TRAY) ×2 IMPLANT
PAD ARMBOARD 7.5X6 YLW CONV (MISCELLANEOUS) ×2 IMPLANT
SET BASIN LINEN APH (SET/KITS/TRAYS/PACK) ×2 IMPLANT
SHEARS HARMONIC 9CM CVD (BLADE) ×2 IMPLANT
SPONGE GAUZE 4X4 12PLY (GAUZE/BANDAGES/DRESSINGS) ×2 IMPLANT
SUT SILK 0 FSL (SUTURE) ×2 IMPLANT
SUT VIC AB 2-0 CT2 27 (SUTURE) ×2 IMPLANT
SYR CONTROL 10ML LL (SYRINGE) ×2 IMPLANT

## 2011-12-31 NOTE — Anesthesia Procedure Notes (Signed)
Spinal  Patient location during procedure: OR Start time: 12/31/2011 9:17 AM Staffing CRNA/Resident: Paysen Goza J Preanesthetic Checklist Completed: patient identified, site marked, surgical consent, pre-op evaluation, timeout performed, IV checked, risks and benefits discussed and monitors and equipment checked Spinal Block Patient position: sitting Prep: Betadine Patient monitoring: heart rate, cardiac monitor, continuous pulse ox and blood pressure Approach: midline Location: L2-3 Injection technique: single-shot Needle Needle type: Spinocan  Needle gauge: 22 G Assessment Sensory level: T12 Additional Notes #[id number]       2013-11

## 2011-12-31 NOTE — Anesthesia Postprocedure Evaluation (Signed)
  Anesthesia Post-op Note  Patient: [name]  Procedure(s) Performed:  HEMORRHOIDECTOMY  Patient Location: PACU  Anesthesia Type: Spinal  Level of Consciousness: awake, alert , oriented and patient cooperative  Airway and Oxygen Therapy: Patient Spontanous Breathing  Post-op Pain: none  Post-op Assessment: Post-op Vital signs reviewed, Patient's Cardiovascular Status Stable, Respiratory Function Stable, Patent Airway and No signs of Nausea or vomiting  Post-op Vital Signs: Reviewed and stable  Complications: No apparent anesthesia complications

## 2011-12-31 NOTE — Transfer of Care (Signed)
Immediate Anesthesia Transfer of Care Note  Patient: [name]  Procedure(s) Performed:  HEMORRHOIDECTOMY  Patient Location: PACU  Anesthesia Type: Spinal  Level of Consciousness: awake, alert , oriented and patient cooperative  Airway & Oxygen Therapy: Patient Spontanous Breathing and Patient connected to face mask oxygen  Post-op Assessment: Report given to PACU RN and Post -op Vital signs reviewed and stable  Post vital signs: Reviewed and stable Filed Vitals:   12/31/11 0905  BP: 125/83  Pulse:   Temp:   Resp: 21    Complications: No apparent anesthesia complications

## 2011-12-31 NOTE — Interval H&P Note (Signed)
History and Physical Interval Note:  12/31/2011 8:22 AM  [name]  has presented today for surgery, with the diagnosis of Hemorrhoids  The various methods of treatment have been discussed with the patient and family. After consideration of risks, benefits and other options for treatment, the patient has consented to  Procedure(s): HEMORRHOIDECTOMY as a surgical intervention .  The patients' history has been reviewed, patient examined, no change in status, stable for surgery.  I have reviewed the patients' chart and labs.  Questions were answered to the patient's satisfaction.     Franky Macho A

## 2011-12-31 NOTE — Op Note (Signed)
Patient:  [name]  DOB:  02/24/1951  MRN:  [id number]   Preop Diagnosis:  Thrombosed hemorrhoids  Postop Diagnosis:  Same  Procedure:  Extensive hemorrhoidectomy  Surgeon:  Franky Macho, M.D.  Anes:  Spinal  Indications:  Patient is a 61 year old white male who presents with extensive thrombosed hemorrhoids. He is still with hemorrhoids for years, but they've not become painful and prolapsed. Risks and benefits of the procedure were fully explained the patient gave her consent.  Procedure note:  Patient was placed in the lithotomy position after spinal anesthesia was administered. The perineum was prepped and draped using usual sterile technique with Betadine. Surgical site confirmation was performed.  On examination, the patient had extensive thrombosed internal hemorrhoids from the 6:00 to 12:00 position. In addition, external hemorrhoid tissue was also present. These were all removed in segments using the LigaSure. Once they were all removed, the mucosal edges were reapproximated using a 2-0 Vicryl suture. A small non-expanding hematoma was noted just proximal to this region. This was inspected for several minutes and was not expanding in nature. I suspect that this was previously edematous tissue from the thrombosis. Care was taken to avoid the external ring. 0.5% Sensorcaine was instilled the surrounding peritoneum. Surgicel and Viscous Xylocaine packing was then placed into the rectum.  All tape and needle counts were correct the end of the procedure. The patient was transferred to PACU in stable condition.  Complications:  None  EBL:  Minimal  Specimen:  Internal and external hemorrhoids

## 2011-12-31 NOTE — Anesthesia Preprocedure Evaluation (Addendum)
Anesthesia Evaluation  Patient identified by MRN, date of birth, ID band Patient awake    Reviewed: Allergy & Precautions, H&P , NPO status , Patient's Chart, lab work & pertinent test results  Airway Mallampati: III TM Distance: >3 FB Neck ROM: Full    Dental No notable dental hx.    Pulmonary shortness of breath, sleep apnea and Continuous Positive Airway Pressure Ventilation ,    Pulmonary exam normal       Cardiovascular neg cardio ROS Regular Normal    Neuro/Psych PSYCHIATRIC DISORDERS Depression    GI/Hepatic negative GI ROS, Neg liver ROS,   Endo/Other  Diabetes mellitus-, Well Controlled, Type 2, Oral Hypoglycemic Agents  Renal/GU negative Renal ROS  Genitourinary negative   Musculoskeletal negative musculoskeletal ROS (+)   Abdominal (+) obese,  Abdomen: soft.    Peds  Hematology negative hematology ROS (+)   Anesthesia Other Findings   Reproductive/Obstetrics                           Anesthesia Physical Anesthesia Plan  ASA: II  Anesthesia Plan: Spinal   Post-op Pain Management:    Induction: Intravenous  Airway Management Planned: Simple Face Mask  Additional Equipment:   Intra-op Plan:   Post-operative Plan:   Informed Consent: I have reviewed the patients History and Physical, chart, labs and discussed the procedure including the risks, benefits and alternatives for the proposed anesthesia with the patient or authorized representative who has indicated his/her understanding and acceptance.     Plan Discussed with: CRNA  Anesthesia Plan Comments:         Anesthesia Quick Evaluation

## 2012-01-03 ENCOUNTER — Encounter (HOSPITAL_COMMUNITY): Payer: Self-pay | Admitting: General Surgery

## 2012-01-06 DIAGNOSIS — L409 Psoriasis, unspecified: Secondary | ICD-10-CM | POA: Insufficient documentation

## 2012-01-26 ENCOUNTER — Telehealth (HOSPITAL_COMMUNITY): Payer: Self-pay | Admitting: *Deleted

## 2012-01-26 NOTE — Telephone Encounter (Signed)
[name] wants to know about his CT scan done 12/16/2011.  Marland Kitchen

## 2012-01-27 ENCOUNTER — Telehealth (HOSPITAL_COMMUNITY): Payer: Self-pay | Admitting: *Deleted

## 2012-01-27 NOTE — Telephone Encounter (Signed)
Message copied by Adelene Amas on Thu Jan 27, 2012  1:50 PM ------      Message from: Mariel Sleet, ERIC S      Created: Wed Jan 26, 2012  5:25 PM       I thought someone had called about this.  Please apologize for [place].      No evidence of cancer, just fatty liver that we knew about.  Does he still see Rourk?      Also what is his smoking history-can look up in his chart or ask him.  Let me know.

## 2012-01-27 NOTE — Telephone Encounter (Signed)
Unable to reach pt at school, will try again later.

## 2012-01-27 NOTE — Telephone Encounter (Signed)
Pt states he is not seeing Dr. Jena Gauss any longer and he has never smoked.Marland KitchenMarland Kitchen

## 2012-03-29 ENCOUNTER — Other Ambulatory Visit (HOSPITAL_COMMUNITY): Payer: Self-pay | Admitting: Family Medicine

## 2012-03-29 DIAGNOSIS — M545 Low back pain: Secondary | ICD-10-CM

## 2012-03-29 DIAGNOSIS — M25552 Pain in left hip: Secondary | ICD-10-CM

## 2012-03-30 ENCOUNTER — Ambulatory Visit (HOSPITAL_COMMUNITY)
Admission: RE | Admit: 2012-03-30 | Discharge: 2012-03-30 | Disposition: A | Discharge status: Home / Self Care | Payer: BC Managed Care – PPO | Source: Ambulatory Visit | Attending: Family Medicine | Admitting: Family Medicine

## 2012-03-30 DIAGNOSIS — M48061 Spinal stenosis, lumbar region without neurogenic claudication: Secondary | ICD-10-CM | POA: Insufficient documentation

## 2012-03-30 DIAGNOSIS — M545 Low back pain, unspecified: Secondary | ICD-10-CM | POA: Insufficient documentation

## 2012-03-30 DIAGNOSIS — M5380 Other specified dorsopathies, site unspecified: Secondary | ICD-10-CM | POA: Insufficient documentation

## 2012-03-30 DIAGNOSIS — M25552 Pain in left hip: Secondary | ICD-10-CM

## 2012-03-30 DIAGNOSIS — M25559 Pain in unspecified hip: Secondary | ICD-10-CM | POA: Insufficient documentation

## 2012-03-30 DIAGNOSIS — M5126 Other intervertebral disc displacement, lumbar region: Secondary | ICD-10-CM | POA: Insufficient documentation

## 2012-04-20 ENCOUNTER — Telehealth: Payer: Self-pay

## 2012-04-20 ENCOUNTER — Ambulatory Visit (HOSPITAL_COMMUNITY)
Admission: RE | Admit: 2012-04-20 | Discharge: 2012-04-20 | Disposition: A | Discharge status: Home / Self Care | Payer: BC Managed Care – PPO | Source: Ambulatory Visit | Attending: Family Medicine | Admitting: Family Medicine

## 2012-04-20 DIAGNOSIS — IMO0001 Reserved for inherently not codable concepts without codable children: Secondary | ICD-10-CM | POA: Insufficient documentation

## 2012-04-20 DIAGNOSIS — S76019A Strain of muscle, fascia and tendon of unspecified hip, initial encounter: Secondary | ICD-10-CM | POA: Insufficient documentation

## 2012-04-20 DIAGNOSIS — M25559 Pain in unspecified hip: Secondary | ICD-10-CM | POA: Insufficient documentation

## 2012-04-20 DIAGNOSIS — M25569 Pain in unspecified knee: Secondary | ICD-10-CM | POA: Insufficient documentation

## 2012-04-20 DIAGNOSIS — R269 Unspecified abnormalities of gait and mobility: Secondary | ICD-10-CM | POA: Insufficient documentation

## 2012-04-20 DIAGNOSIS — M7918 Myalgia, other site: Secondary | ICD-10-CM | POA: Insufficient documentation

## 2012-04-20 DIAGNOSIS — M6281 Muscle weakness (generalized): Secondary | ICD-10-CM | POA: Insufficient documentation

## 2012-04-20 NOTE — Telephone Encounter (Signed)
Yes. Patient needs an appointment with the extender for further evaluation.

## 2012-04-20 NOTE — Telephone Encounter (Signed)
Susan please schedule pt appt.  

## 2012-04-20 NOTE — Evaluation (Signed)
Physical Therapy Evaluation  Patient Details  Name: [name] MRN: [id number] Date of Birth: Apr 25, 1951  Today's Date: 04/20/2012 Time: 3086-5784 PT Time Calculation (min): 43 min Charges: 1 eval Visit#: 1  of 8   Re-eval: 05/20/12 Assessment Diagnosis: Adductor Strain Next MD Visit: Dr. Dimas Aguas - unscheduled Prior Therapy: None  Past Medical History:  Past Medical History  Diagnosis Date  . Colon cancer   . BPH (benign prostatic hyperplasia)   . Staph infection 2008    left side of face  . Depression   . Glaucoma 2012  . Sleep apnea     wears CiPaP at night  . Shortness of breath     with exertion  . Diabetes mellitus   . Complication of anesthesia     vagal response after umbilcal hernia at [facility]   Past Surgical History:  Past Surgical History  Procedure Date  . Kidney stone surgery   . Umbilical hernia repair   . Colectomy   . Skin lesion excision     left shoulder/pre cancerous  . Polonodial cyst surgery 1976  . Appendectomy   . Cardiac catheterization   . Hernia repair     umbilical  . Hemorrhoid surgery 12/31/2011    Procedure: HEMORRHOIDECTOMY;  Surgeon: [name];  Location: [facility];  Service: General;  Laterality: N/A;    Subjective Symptoms/Limitations Symptoms: Pt reports about 5 weeks ago (when he was playing golf) he started to notice he had pain in the groin area when leaning over to place a pin in the ground and noticed pain (on the 4th).  He has a lot in the morning and when he has to increase his walking. Nature: Dull/intermittent sharp when sitting, putting on shoes/socls.  Alleviating: Laying down; Aggrevating: putting on socks and shoes, getting into and out of the car.  How long can you sit comfortably?: less than 20 minutes pain in groin region.  Hurts going from stand to sit.  How long can you stand comfortably?: 45 minutes  How long can you walk comfortably?: 1 mile (increased pain when walking up steps) Pain  Assessment Currently in Pain?: Yes Pain Score:   4 (Range: 4-8/10. 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   Leisure: Hobbies-yes (Comment)  Cognition/Observation Observation/Other Assessments Observations: Diastasis Recti.  Comes in with slip on shoes secondary to inablity to put on laced shoes   Sensation/Coordination/Flexibility/Functional Tests Functional Tests Functional Tests: LEFS: 20/80  Assessment LLE Strength LLE Overall Strength Comments: Sartorius: 3/5 weak and painful Left Hip Flexion: 4/5 (painful) Left Hip Extension: 3/5 Left Hip ABduction: 3+/5 (weak and painful) Left Hip ADduction: 3+/5 Left Knee Flexion: 5/5 Left Knee Extension: 5/5 Palpation Palpation: Signficant pain and tenderness with fascial restrictions to L sartorious muscle belly and pain to insertion of sartorius.  Pain and tenderness throughout L hip/groin region.    Mobility/Balance  Static Standing Balance Single Leg Stance - Right Leg: 7  (impaired ankle strategy) Single Leg Stance - Left Leg: 5  (impaired ankle strategy) Tandem Stance - Right Leg: 8  (impaired ankle strategy) Tandem Stance - Left Leg: 7  (impaired ankle strategy) Rhomberg - Eyes Opened: 10  Rhomberg -  Eyes Closed: 10    Exercise/Treatments Stretches Active Hamstring Stretch: Limitations Active Hamstring Stretch Limitations: 10 pulsing w/30 sec hold after w/toes point out Seated Other Seated Knee Exercises: Adductor stretch 30 sec Sidelying Clams: 2x10 sec holds after NMR  Physical Therapy Assessment and Plan PT Assessment and Plan Clinical Impression Statement: Pt is  a 61 year old male referred to PT for adductor strain.  After further examination I feel he has a sartorius strain and significant hip/core weakness from diastisis recti.  Pt will benefit from skilled therapeutic intervention in order to improve on the following deficits: Decreased strength;Decreased range of motion;Decreased balance;Pain;Increased fascial restricitons;Decreased coordination (impaired percieved functional ability) Rehab Potential: Good PT Frequency: Min 2X/week PT Duration: 4 weeks PT Treatment/Interventions: Stair training;Functional mobility training;Gait training;Therapeutic activities;Therapeutic exercise;Balance training;Other (comment);Neuromuscular re-education;Patient/family education (MFR and modalities for pain control) PT Plan: MFR to reduce spasms to L sartorius first, continue with     Goals Home Exercise Program Pt will Perform Home Exercise Program: Independently PT Short Term Goals Time to Complete Short Term Goals: 2 weeks PT Short Term Goal 1: Pt will report pain less than 5/10 when donning LE clothing and getting into and out of the car. PT Short Term Goal 2: Pt will improve L hip sterength by 1/2 muscle grade. PT Long Term Goals Time to Complete Long Term Goals: 4 weeks PT Long Term Goal 1: Pt will improve his LEFS to 30/80 for improved percieved functional ability.  PT Long Term Goal 2: Pt will report pain less than 3/10 w/ADL's that require him to transfer his son. Long Term Goal 3: Pt will present with minimal fascial restrcitons to sartorious muscle.   Problem List Patient Active Problem List  Diagnoses  . COLON CANCER  . VITAMIN B12 DEFICIENCY  . OBESITY  . GASTROESOPHAGEAL REFLUX DISEASE, HX OF  . Musculoskeletal pain  . Strain of hip adductor muscle  . Hip pain    PT - End of Session Activity Tolerance: Patient tolerated treatment well  Margarita Croke 04/20/2012, 1:33 PM  Physician Documentation Your signature is required to indicate  approval of the treatment plan as stated above.  Please sign and either send electronically or make a copy of this report for your files and return this physician signed original.   Please mark one 1.__approve of plan  2. ___approve of plan with the following conditions.   ______________________________                                                          _____________________ Physician Signature                                                                                                             Date

## 2012-04-20 NOTE — Telephone Encounter (Signed)
Pt came by office and left a letter for Dr. Jena Gauss, the pt stated in the letter that he has been having blood in his stool that looks like "lumps of clots" he said they are nickel-size. Pt wants to know if he needs appt here.  Pt had hemorrhoid surgery by Dr. Lovell Sheehan in January. Pt's last tcs was 05/11/2010.   Pt had already left the office before I was given the letter. I have tried to call pt at the numbers he left on the letter. LM on cell number, LM with wife at home number to get further information.

## 2012-04-24 ENCOUNTER — Encounter: Payer: Self-pay | Admitting: Internal Medicine

## 2012-04-25 ENCOUNTER — Ambulatory Visit (INDEPENDENT_AMBULATORY_CARE_PROVIDER_SITE_OTHER): Payer: BC Managed Care – PPO | Admitting: Gastroenterology

## 2012-04-25 ENCOUNTER — Ambulatory Visit (HOSPITAL_COMMUNITY)
Admission: RE | Admit: 2012-04-25 | Discharge: 2012-04-25 | Disposition: A | Discharge status: Home / Self Care | Payer: BC Managed Care – PPO | Source: Ambulatory Visit

## 2012-04-25 ENCOUNTER — Encounter: Payer: Self-pay | Admitting: Gastroenterology

## 2012-04-25 ENCOUNTER — Encounter (HOSPITAL_COMMUNITY): Payer: Self-pay | Admitting: Dietician

## 2012-04-25 VITALS — BP 135/85 | HR 85 | Temp 97.0°F | Ht 69.0 in | Wt 235.0 lb

## 2012-04-25 DIAGNOSIS — K625 Hemorrhage of anus and rectum: Secondary | ICD-10-CM

## 2012-04-25 DIAGNOSIS — C189 Malignant neoplasm of colon, unspecified: Secondary | ICD-10-CM

## 2012-04-25 DIAGNOSIS — R195 Other fecal abnormalities: Secondary | ICD-10-CM | POA: Insufficient documentation

## 2012-04-25 MED ORDER — PEG-KCL-NACL-NASULF-NA ASC-C 100 G PO SOLR: 1.0000 | Freq: Once | ORAL | Status: DC

## 2012-04-25 NOTE — Progress Notes (Signed)
[facility] Diabetes Class Completion  Date:Apr 25, 2012  Time: 10:00 AM  Pt attended [facility]'s Diabetes Class on Apr 25, 2012.   Patient was educated on the following topics: carbohydrate metabolism in relation to diabetes, sources of carbohydrate, carbohydrate counting, meal planning strategies, food label reading, and portion control.   Averil Digman A. Kayan, RD, LDN Date:Apr 25, 2012 Time: 10:00 AM 

## 2012-04-25 NOTE — Assessment & Plan Note (Signed)
Chronic intermittent rectal bleeding, change in bowels, prior history of right hemicolectomy in 2007 for stage II colon cancer. Prior to this hemorrhoid surgery, bleeding was different. Describes bright red blood per rectum mostly on the toilet tissue or dripping into the toilet. Now he complains of darker blood mixed in the stools and harder stools than usual. With h/o anastomotic friability two years ago, this could be cause. Recommend diagnostic colonoscopy to further evaluate.  I have discussed the risks, alternatives, benefits with regards to but not limited to the risk of reaction to medication, bleeding, infection, perforation and the patient is agreeable to proceed. Written consent to be obtained.

## 2012-04-25 NOTE — Progress Notes (Signed)
Primary Care Physician:  HOWARD, KEVIN, MD, MD  Primary Gastroenterologist:  Michael Rourk, MD   Chief Complaint  Patient presents with  . Rectal Bleeding    HPI:  [name] is a 61 y.o. male here for further evaluation of change in bowel habits and rectal bleeding. Patient has a history of stage II colon cancer status post right hemicolectomy in 2007. Last colonoscopy May 2011. At that time he had some angry appearing anastomotic mucosa with erosions and using. Biopsy was benign.  Patient complains of chronic intermittent rectal bleeding. In January he had hemorrhoidectomy by Dr. Mark Jenkins. Before hemorrhoid surgery, brbpr dripping into toilet. After surgery, some constipation related to Lithium. Taking suppositories bid to help to go. Had not had formed stools for years until recently. Blood mixed into stool, dark red. No melena. Sometimes just blood on tissue. Chalky or yellowish stools. First stool solid and then very mushy. No abdominal pain in the last six months. Prior to this some intermittent sharp LLQ pain. Occasional heartburn. No dysphagia. Denies weight loss.  Current Outpatient Prescriptions  Medication Sig Dispense Refill  . amitriptyline (ELAVIL) 50 MG tablet Take 250 mg by mouth at bedtime.       . CYANOCOBALAMIN IJ Inject 1,000 mcg as directed every 30 (thirty) days.        . lamoTRIgine (LAMICTAL) 100 MG tablet Take 150 mg by mouth daily.       . metFORMIN (GLUCOPHAGE) 500 MG tablet Take 500 mg by mouth daily with breakfast.       . timolol (TIMOPTIC) 0.5 % ophthalmic solution Place 1 drop into both eyes daily.       . lithium carbonate (LITHOBID) 300 MG CR tablet Take 300 mg by mouth 2 (two) times daily. 2 in am and 3 at night      . oxyCODONE-acetaminophen (PERCOCET) 7.5-325 MG per tablet Take 1-2 tablets by mouth every 4 (four) hours as needed for pain.  50 tablet  0    Allergies as of 04/25/2012 - Review Complete 04/25/2012  Allergen Reaction Noted  .  Hydromorphone hcl Other (See Comments) 08/14/2009    Past Medical History  Diagnosis Date  . Colon cancer     stage II, diagnosed 2007  . BPH (benign prostatic hyperplasia)   . Staph infection 2008    left side of face  . Depression   . Glaucoma 2012  . Sleep apnea     wears CiPaP at night  . Shortness of breath     with exertion  . Diabetes mellitus   . Complication of anesthesia     vagal response after umbilcal hernia at [facility]    Past Surgical History  Procedure Date  . Kidney stone surgery   . Umbilical hernia repair   . Colectomy 2007    right hemicolectomy  . Skin lesion excision     left shoulder/pre cancerous  . Pilonidal cyst / sinus excision 1976  . Appendectomy   . Cardiac catheterization   . Hernia repair     umbilical  . Hemorrhoid surgery 12/31/2011    Procedure: HEMORRHOIDECTOMY;  Surgeon: Mark A Jenkins;  Location: [facility];  Service: General;  Laterality: N/A;  . Colonoscopy 05/11/10    normal rectum,normal residual colon with some angry appearring anastomtic mucosa with some erosions and oozing, bx unremarkable    Family History  Problem Relation Age of Onset  . Anesthesia problems Neg Hx   . Hypotension Neg Hx   .   Malignant hyperthermia Neg Hx   . Pseudochol deficiency Neg Hx   . Lung cancer Father   . Diabetes Mother     History   Social History  . Marital Status: Married    Spouse Name: N/A    Number of Children: 1  . Years of Education: N/A   Occupational History  . school system    Social History Main Topics  . Smoking status: Never Smoker   . Smokeless tobacco: Not on file  . Alcohol Use: No  . Drug Use: No  . Sexually Active:    Other Topics Concern  . Not on file   Social History Narrative  . No narrative on file      ROS:  General: Negative for anorexia, weight loss, fever, chills, fatigue, weakness. Eyes: Negative for vision changes.  ENT: Negative for hoarseness, difficulty swallowing , nasal congestion. CV:  Negative for chest pain, angina, palpitations, dyspnea on exertion, peripheral edema.  Respiratory: Negative for dyspnea at rest, dyspnea on exertion, cough, sputum, wheezing.  GI: See history of present illness. GU:  Negative for dysuria, hematuria, urinary incontinence, urinary frequency, nocturnal urination.  MS: Negative for joint pain, low back pain.  Derm: Negative for rash or itching.  Neuro: Negative for weakness, abnormal sensation, seizure, frequent headaches, memory loss, confusion.  Psych: Negative for anxiety, depression, suicidal ideation, hallucinations.  Endo: Negative for unusual weight change.  Heme: Negative for bruising or bleeding. Allergy: Negative for rash or hives.    Physical Examination:  BP 135/85  Pulse 85  Temp(Src) 97 F (36.1 C) (Temporal)  Ht 5' 9" (1.753 m)  Wt 235 lb (106.595 kg)  BMI 34.70 kg/m2   General: Well-nourished, well-developed in no acute distress.  Head: Normocephalic, atraumatic.   Eyes: Conjunctiva pink, no icterus. Mouth: Oropharyngeal mucosa moist and pink , no lesions erythema or exudate. Neck: Supple without thyromegaly, masses, or lymphadenopathy.  Lungs: Clear to auscultation bilaterally.  Heart: Regular rate and rhythm, no murmurs rubs or gallops.  Abdomen: Bowel sounds are normal, nontender, nondistended, no hepatosplenomegaly or masses, no abdominal bruits or    hernia , no rebound or guarding.   Rectal: deferred at time of colonoscopy Extremities: No lower extremity edema. No clubbing or deformities.  Neuro: Alert and oriented x 4 , grossly normal neurologically.  Skin: Warm and dry, no rash or jaundice.   Psych: Alert and cooperative, normal mood and affect.  Labs: Lab Results  Component Value Date   WBC 6.7 12/29/2011   HGB 13.9 12/31/2011   HCT 41.0 12/31/2011   MCV 87.3 12/29/2011   PLT 198 12/29/2011   Lab Results  Component Value Date   CEA 1.1 11/16/2011       

## 2012-04-25 NOTE — Progress Notes (Signed)
Faxed to PCP

## 2012-04-25 NOTE — Progress Notes (Signed)
Physical Therapy Treatment Patient Details  Name: [name] MRN: [id number] Date of Birth: 05/07/1951  Today's Date: 04/25/2012 Time: 4098-1191 PT Time Calculation (min): 40 min  Visit#: 2  of 8   Re-eval: 05/20/12  Charge: therex 15 min Manual 25 min   Subjective: Symptoms/Limitations Symptoms: Pt reported no pain into dept, pt stated pain increases in mornings to 5-7/10. Pain Assessment Currently in Pain?: No/denies  Objective:  Exercise/Treatments Stretches Active Hamstring Stretch: 3 reps;30 seconds;Limitations Active Hamstring Stretch Limitations: with rope Seated Other Seated Knee Exercises: Adductor stretch 3x 30 sec Sidelying Clams: NMR glut med 5x 10 sec holds with tactile cueing to deactivate quad   Manual Therapy Manual Therapy: Myofascial release Myofascial Release: to adductor and sartorius x 25 min  Physical Therapy Assessment and Plan PT Assessment and Plan Clinical Impression Statement: Began treatment based on plan per PT, pt able to demostrate appropriate form with stretches with noted tight hip musculature.  Able to reduce spasms L sartorious, pt stated pain free at end of session with improved gait mechanics.   PT Plan: Begin seated heel/toe roll in/outs.    Goals    Problem List Patient Active Problem List  Diagnoses  . COLON CANCER  . VITAMIN B12 DEFICIENCY  . OBESITY  . GASTROESOPHAGEAL REFLUX DISEASE, HX OF  . Musculoskeletal pain  . Strain of hip adductor muscle  . Hip pain  . Change in stool  . Rectal bleeding    PT - End of Session Activity Tolerance: Patient tolerated treatment well General Behavior During Session: [facility] for tasks performed Cognition: [facility] for tasks performed  GP No functional reporting required  Juel Burrow, PTA 04/25/2012, 3:24 PM

## 2012-04-27 ENCOUNTER — Ambulatory Visit (HOSPITAL_COMMUNITY)
Admission: RE | Admit: 2012-04-27 | Discharge: 2012-04-27 | Disposition: A | Discharge status: Home / Self Care | Payer: BC Managed Care – PPO | Source: Ambulatory Visit | Attending: Family Medicine | Admitting: Family Medicine

## 2012-04-27 NOTE — Progress Notes (Signed)
Physical Therapy Treatment Patient Details  Name: [name] MRN: [id number] Date of Birth: [birth year]  Today's Date: 04/27/2012 Time: 4098-1191 PT Time Calculation (min): 45 min  Visit#: 3  of 8   Re-eval: 05/20/12  Charge: manual 22 min therex 23 min  Subjective: Symptoms/Limitations Symptoms: Pt stated relief following last session was pain free for hours, noticed pain increase when getting into vehicle.  Pain scale 4/10. Pain Assessment Currently in Pain?: Yes Pain Score:   4 Pain Location: Leg Pain Orientation: Left;Upper;Mid  Objective:  Exercise/Treatments Stretches Active Hamstring Stretch: 3 reps;30 seconds;Limitations Active Hamstring Stretch Limitations: with rope Standing Other Standing Knee Exercises: D1/D2 pattern L LE 5x 5" Seated Other Seated Knee Exercises: Adductor stretch 3x 30 sec Other Seated Knee Exercises: heel/toe roll in/out 10x 10" Sidelying Clams: NMR glut med 10x 10 sec holds with tactile cueing to deactivate quad  Physical Therapy Assessment and Plan PT Assessment and Plan Clinical Impression Statement: MFR with good release noted adductor, pt with reduced pain following therex and manual with improved gait mechanics.   PT Plan: Assess pain relief next session, continue progressing current POC.    Goals    Problem List Patient Active Problem List  Diagnoses  . COLON CANCER  . VITAMIN B12 DEFICIENCY  . OBESITY  . GASTROESOPHAGEAL REFLUX DISEASE, HX OF  . Musculoskeletal pain  . Strain of hip adductor muscle  . Hip pain  . Change in stool  . Rectal bleeding    PT - End of Session Activity Tolerance: Patient tolerated treatment well General Behavior During Session: [facility] for tasks performed Cognition: [facility] for tasks performed  GP No functional reporting required  Juel Burrow, PTA 04/27/2012, 5:49 PM

## 2012-05-02 ENCOUNTER — Ambulatory Visit (HOSPITAL_COMMUNITY)
Admission: RE | Admit: 2012-05-02 | Discharge: 2012-05-02 | Disposition: A | Discharge status: Home / Self Care | Payer: BC Managed Care – PPO | Source: Ambulatory Visit | Attending: Family Medicine | Admitting: Family Medicine

## 2012-05-02 NOTE — Progress Notes (Signed)
Physical Therapy Treatment Patient Details  Name: [name] MRN: [id number] Date of Birth: 11-01-1951  Today's Date: 05/02/2012 Time: 7829-5621 PT Time Calculation (min): 48 min  Visit#: 4  of 8   Re-eval: 05/20/12  Charge: gait 8 min Manual 23 min therex 17 min  Subjective: Symptoms/Limitations Symptoms: Pt stated pain increases at movements with knee flexion, can increase 7/10. Pain Assessment Currently in Pain?: Yes Pain Score:   7 Pain Location: Leg Pain Orientation: Left;Upper;Mid  Objective:   Exercise/Treatments Stretches Active Hamstring Stretch: 3 reps;30 seconds;Limitations Active Hamstring Stretch Limitations: with rope Standing Gait Training: Gait training for appropriate heel to toe, knee flexion s 8 min Seated Other Seated Knee Exercises: Adductor stretch 3x 30 sec Other Seated Knee Exercises: heel/toe roll in/out 10x 10"   Manual Therapy Manual Therapy: Myofascial release Myofascial Release: to adductor and sartorius x 23 min  Physical Therapy Assessment and Plan PT Assessment and Plan Clinical Impression Statement: Gait training complete at beginning of session to address the circumduction gait due to increased pain with knee flexion, pt explained impairments that can result from improper gait mechanics.  Good results noted following manual MFR to adductors/sartorius, able to release adductor tightness.  Pt with improved gait mechanics and stated pain resolved at end of session. PT Plan: Assess pain relief following this session, Continue with manual to adductors/sartorius, stretches and begin 4 way hip strengthening therex next session.    Goals    Problem List Patient Active Problem List  Diagnoses  . COLON CANCER  . VITAMIN B12 DEFICIENCY  . OBESITY  . GASTROESOPHAGEAL REFLUX DISEASE, HX OF  . Musculoskeletal pain  . Strain of hip adductor muscle  . Hip pain  . Change in stool  . Rectal bleeding    PT - End of Session Activity  Tolerance: Patient tolerated treatment well General Behavior During Session: [facility] for tasks performed Cognition: [facility] for tasks performed  GP No functional reporting required  Juel Burrow 05/02/2012, 5:32 PM

## 2012-05-04 ENCOUNTER — Ambulatory Visit (HOSPITAL_COMMUNITY): Payer: BC Managed Care – PPO | Admitting: Physical Therapy

## 2012-05-05 ENCOUNTER — Ambulatory Visit (HOSPITAL_COMMUNITY)
Admission: RE | Admit: 2012-05-05 | Discharge: 2012-05-05 | Disposition: A | Discharge status: Home / Self Care | Payer: BC Managed Care – PPO | Source: Ambulatory Visit

## 2012-05-05 NOTE — Progress Notes (Signed)
Physical Therapy Treatment Patient Details  Name: [name] MRN: [id number] Date of Birth: 12-24-1950  Today's Date: 05/05/2012 Time: 4098-1191 PT Time Calculation (min): 42 min Charges: 10' TE, 25' Manual Visit#: 5  of 8   Re-eval: 05/20/12   Subjective: Symptoms/Limitations Symptoms: Pt reports that his leg hurts even when he lays on his back.  Pain Assessment Currently in Pain?: Yes Pain Score:   5 (w/certain movmements) Pain Location: Leg Pain Orientation: Left;Upper;Mid   Exercise/Treatments Stretches Active Hamstring Stretch: 30 seconds;Limitations;5 reps Active Hamstring Stretch Limitations: with rope   Seated Other Seated Knee Exercises: Adductor stretch 5x 30 sec  Manual Therapy Manual Therapy: Myofascial release Myofascial Release: to adductor and sartorius w/pt in semi-reclined position with knee flexion and hip abduction (adductor stretch position) x25'  (pain after 1/10)  Physical Therapy Assessment and Plan PT Assessment and Plan Clinical Impression Statement: Treatment today was completed by trained MFR COTA (acting as technician).  He has significant reduction in pain at the end of treatment today.     PT Plan: Assess pain relief following this session, Continue with manual to adductors/sartorius, stretches and begin 4 way hip strengthening therex next session.    Goals    Problem List Patient Active Problem List  Diagnoses  . COLON CANCER  . VITAMIN B12 DEFICIENCY  . OBESITY  . GASTROESOPHAGEAL REFLUX DISEASE, HX OF  . Musculoskeletal pain  . Strain of hip adductor muscle  . Hip pain  . Change in stool  . Rectal bleeding       GP No functional reporting required  [name] 05/05/2012, 3:16 PM

## 2012-05-09 ENCOUNTER — Ambulatory Visit (HOSPITAL_COMMUNITY)
Admission: RE | Admit: 2012-05-09 | Discharge: 2012-05-09 | Disposition: A | Discharge status: Home / Self Care | Payer: BC Managed Care – PPO | Source: Ambulatory Visit | Attending: Family Medicine | Admitting: Family Medicine

## 2012-05-09 NOTE — Progress Notes (Signed)
Physical Therapy Treatment Patient Details  Name: [name] MRN: [id number] Date of Birth: 1951/09/25  Today's Date: 05/09/2012 Time: 1478-2956 PT Time Calculation (min): 39 min Visit#: 6  of 8   Re-eval: 05/19/12 Charges:  Manual 25', therex 12'    Subjective: Symptoms/Limitations Symptoms: Pt. states his pain is 7/10 today.  States it felt better after last visit but then the pain came back and was increased all down his L LE. Pain Assessment Currently in Pain?: Yes Pain Score:   7 Pain Location: Leg Pain Orientation: Left;Upper;Medial   Exercise/Treatments Stretches Active Hamstring Stretch: 30 seconds;Limitations;5 reps Active Hamstring Stretch Limitations: with rope Passive Hamstring Stretch: 3 reps;30 seconds Seated Other Seated Knee Exercises: Adductor stretch 5x 30 sec   Manual performed by Seth Bake, PTA Manual Therapy: Myofascial release Myofascial Release: to adductor and sartorius w/pt in semi-reclined position with knee flexion and hip abduction (adductor stretch position)   Manual performed by Seth Bake, PTA  Physical Therapy Assessment and Plan PT Assessment and Plan Clinical Impression Statement: Added piriformis stretch with good stretch obtained/relief noted.  Palpable tightness length of adductor muscle with more tension felt at origin.   Good release obtained with stretches performed after MFR.   Strengthening exercises were not added today due to increase in pain..  Pt. reported being painfree at end of session today. PT Plan: Continue to progress while reducing pain.  Begin 4 way hip strengthening when pain decreased.      Problem List Patient Active Problem List  Diagnoses  . COLON CANCER  . VITAMIN B12 DEFICIENCY  . OBESITY  . GASTROESOPHAGEAL REFLUX DISEASE, HX OF  . Musculoskeletal pain  . Strain of hip adductor muscle  . Hip pain  . Change in stool  . Rectal bleeding    PT - End of Session Activity Tolerance: Patient  tolerated treatment well General Behavior During Session: [facility] for tasks performed Cognition: [facility] for tasks performed   [name], PTA 05/09/2012, 4:44 PM

## 2012-05-11 ENCOUNTER — Ambulatory Visit (HOSPITAL_COMMUNITY)
Admission: RE | Admit: 2012-05-11 | Discharge: 2012-05-11 | Disposition: A | Discharge status: Home / Self Care | Payer: BC Managed Care – PPO | Source: Ambulatory Visit | Attending: Family Medicine | Admitting: Family Medicine

## 2012-05-11 NOTE — Progress Notes (Signed)
Physical Therapy Re-evaluation/Treatment  Patient Details  Name: [name] MRN: [id number] Date of Birth: [birth year]  Today's Date: 05/11/2012 Time: 1478-2956 PT Time Calculation (min): 38 min  Visit#: 7  of 8   Re-eval: 05/19/12 Assessment Diagnosis: Adductor Strain Next MD Visit: Dr. Dimas Aguas - end of June for physical Prior Therapy: None Charge: manual 15 min MMT 1 unit therex 15 min   Subjective Symptoms/Limitations Symptoms: Pt stated pain free at entrance unless he crosses legs (stated doesnt do that much).  Pt has colonoscopy scheduled for next session, feels ready to be done today. Pain Assessment Currently in Pain?: No/denies  Objective:   Sensation/Coordination/Flexibility/Functional Tests Functional Tests Functional Tests: LEFS 75/80 (LEFS was 20/50)  Assessment LLE Strength LLE Overall Strength Comments: Sartorius: 5/5 weak and painful (Sartorius was 3/5 weal and painful) Left Hip Flexion: 5/5 (was 4/5 painful) Left Hip Extension: 5/5 (was 3/5) Left Hip ABduction: 5/5 (was 3+/5) Left Hip ADduction: 5/5 (was 3+/5) Left Knee Flexion: 5/5 (was 5/5) Left Knee Extension: 5/5 (was 5/5 )  Exercise/Treatments Stretches Active Hamstring Stretch: 3 reps;30 seconds Active Hamstring Stretch Limitations: with rope Seated Other Seated Knee Exercises: Adductor stretch 3x 30 sec  Physical Therapy Assessment and Plan PT Assessment and Plan Clinical Impression Statement: Reassessment complete per pt.'s request.  Mr. Oriordan has had 7 treatments over 4 weeks with the following findings: Pt has met 2/2 STGs and 3/3 LTGs.  Pt reported pain scale 1/10 while donning/doffing clothes, getting in and out of vehicle and with ADL while transferring son.  LE strength has increased to all WNL.  Sartorious mm presentes with minimal fascial restrictions.  Pt.has increased his perceived LE functtional  scale to 75/80 for improved QOL (was 20/50). PT Plan: D/C to HEP per all goals met.     Goals Home Exercise Program Pt will Perform Home Exercise Program: Independently PT Goal: Perform Home Exercise Program - Progress: Met PT Short Term Goals Time to Complete Short Term Goals: 2 weeks PT Short Term Goal 1: Pt will report pain less than 5/10 when donning LE clothing and getting into and out of the car. PT Short Term Goal 1 - Progress: Met (donning/doffing clothing pain scale 1/10 today) PT Short Term Goal 2: Pt will improve L hip sterength by 1/2 muscle grade. PT Short Term Goal 2 - Progress: Met PT Long Term Goals Time to Complete Long Term Goals: 4 weeks PT Long Term Goal 1: Pt will improve his LEFS to 30/80 for improved percieved functional ability.  PT Long Term Goal 1 - Progress: Met (75/80 was 20/80) PT Long Term Goal 2: Pt will report pain less than 3/10 w/ADL's that require him to transfer his son. PT Long Term Goal 2 - Progress: Met Long Term Goal 3: Pt will present with minimal fascial restrcitons to sartorious muscle.  Long Term Goal 3 Progress: Met  Problem List Patient Active Problem List  Diagnoses  . COLON CANCER  . VITAMIN B12 DEFICIENCY  . OBESITY  . GASTROESOPHAGEAL REFLUX DISEASE, HX OF  . Musculoskeletal pain  . Strain of hip adductor muscle  . Hip pain  . Change in stool  . Rectal bleeding    PT - End of Session Activity Tolerance: Patient tolerated treatment well General Behavior During Session: [facility] for tasks performed Cognition: [facility] for tasks performed PT Plan of Care PT Home Exercise Plan: pt given printout for h/s and adductor stretches.  GP Juel Burrow, PTA 05/11/2012, 6:26 PM

## 2012-05-12 ENCOUNTER — Encounter (HOSPITAL_COMMUNITY): Payer: Self-pay | Admitting: Pharmacist

## 2012-05-16 ENCOUNTER — Encounter (HOSPITAL_COMMUNITY)
Admission: RE | Disposition: A | Discharge status: Home / Self Care | Payer: Self-pay | Source: Ambulatory Visit | Attending: Internal Medicine

## 2012-05-16 ENCOUNTER — Ambulatory Visit (HOSPITAL_COMMUNITY): Payer: BC Managed Care – PPO

## 2012-05-16 ENCOUNTER — Encounter (HOSPITAL_COMMUNITY): Payer: Self-pay | Admitting: *Deleted

## 2012-05-16 ENCOUNTER — Ambulatory Visit (HOSPITAL_COMMUNITY)
Admission: RE | Admit: 2012-05-16 | Discharge: 2012-05-16 | Disposition: A | Discharge status: Home / Self Care | Payer: BC Managed Care – PPO | Source: Ambulatory Visit | Attending: Internal Medicine | Admitting: Internal Medicine

## 2012-05-16 DIAGNOSIS — E119 Type 2 diabetes mellitus without complications: Secondary | ICD-10-CM | POA: Insufficient documentation

## 2012-05-16 DIAGNOSIS — Z01812 Encounter for preprocedural laboratory examination: Secondary | ICD-10-CM | POA: Insufficient documentation

## 2012-05-16 DIAGNOSIS — K921 Melena: Secondary | ICD-10-CM | POA: Insufficient documentation

## 2012-05-16 DIAGNOSIS — Z85038 Personal history of other malignant neoplasm of large intestine: Secondary | ICD-10-CM | POA: Insufficient documentation

## 2012-05-16 DIAGNOSIS — K633 Ulcer of intestine: Secondary | ICD-10-CM

## 2012-05-16 DIAGNOSIS — G4733 Obstructive sleep apnea (adult) (pediatric): Secondary | ICD-10-CM | POA: Insufficient documentation

## 2012-05-16 DIAGNOSIS — C189 Malignant neoplasm of colon, unspecified: Secondary | ICD-10-CM

## 2012-05-16 DIAGNOSIS — K5730 Diverticulosis of large intestine without perforation or abscess without bleeding: Secondary | ICD-10-CM | POA: Insufficient documentation

## 2012-05-16 DIAGNOSIS — K625 Hemorrhage of anus and rectum: Secondary | ICD-10-CM

## 2012-05-16 DIAGNOSIS — Z9049 Acquired absence of other specified parts of digestive tract: Secondary | ICD-10-CM | POA: Insufficient documentation

## 2012-05-16 DIAGNOSIS — R195 Other fecal abnormalities: Secondary | ICD-10-CM

## 2012-05-16 HISTORY — PX: COLONOSCOPY: SHX5424

## 2012-05-16 LAB — CBC
MCHC: 35.5 g/dL (ref 30.0–36.0)
Platelets: 162 10*3/uL (ref 150–400)
RDW: 13.8 % (ref 11.5–15.5)
WBC: 5.9 10*3/uL (ref 4.0–10.5)

## 2012-05-16 LAB — GLUCOSE, CAPILLARY: Glucose-Capillary: 90 mg/dL (ref 70–99)

## 2012-05-16 SURGERY — COLONOSCOPY
Anesthesia: Moderate Sedation

## 2012-05-16 MED ORDER — SODIUM CHLORIDE 0.45 % IV SOLN
Freq: Once | INTRAVENOUS | Status: AC
  Administered 2012-05-16: 07:00:00 via INTRAVENOUS

## 2012-05-16 MED ORDER — MEPERIDINE HCL 100 MG/ML IJ SOLN
INTRAMUSCULAR | Status: DC | PRN
  Administered 2012-05-16: 50 mg via INTRAVENOUS
  Administered 2012-05-16: 25 mg via INTRAVENOUS

## 2012-05-16 MED ORDER — MEPERIDINE HCL 100 MG/ML IJ SOLN
INTRAMUSCULAR | Status: AC
  Filled 2012-05-16: qty 1

## 2012-05-16 MED ORDER — MIDAZOLAM HCL 5 MG/5ML IJ SOLN
INTRAMUSCULAR | Status: AC
  Filled 2012-05-16: qty 10

## 2012-05-16 MED ORDER — MIDAZOLAM HCL 5 MG/5ML IJ SOLN
INTRAMUSCULAR | Status: DC | PRN
  Administered 2012-05-16 (×2): 2 mg via INTRAVENOUS

## 2012-05-16 NOTE — Op Note (Signed)
[facility] [address] [place], [place]  [address]  COLONOSCOPY PROCEDURE REPORT  PATIENT:  Robert, [name]  MR#:  [id number] BIRTHDATE:  04/29/1951, 61 yrs. old  GENDER:  male ENDOSCOPIST:  [name], MD FACP [facility] REF. BY:  [name], M.D. PROCEDURE DATE:  05/16/2012 PROCEDURE:  Colonoscopy with biopsy  INDICATIONS:  Hematochezia; status post right hemicolectomy for colon cancer a number of years ago. Hemorrhoidectomy the first of this year.      Patient denies NSAID use.  INFORMED CONSENT:  The risks, benefits, alternatives and imponderables including but not limited to bleeding, perforation as well as the possibility of a missed lesion have been reviewed. The potential for biopsy, lesion removal, etc. have also been discussed.  Questions have been answered.  All parties agreeable. Please see the history and physical in the medical record for more information.  MEDICATIONS:  Versed 4 mg IV and Demerol 75 mg IV in divided doses.  DESCRIPTION OF PROCEDURE:  After a digital rectal exam was performed, the EC-[id number] ([id number]) colonoscope was advanced from the anus through the rectum and colon to the area of the cecum, ileocecal valve and appendiceal orifice.  The cecum was deeply intubated.  These structures were well-seen and photographed for the record.  From the level of the cecum and ileocecal valve, the scope was slowly and cautiously withdrawn.  The mucosal surfaces were carefully surveyed utilizing scope tip deflection to facilitate fold flattening as needed.  The scope was pulled down into the rectum where a thorough examination including retroflexion was performed. <<PROCEDUREIMAGES>>  FINDINGS: Normal preparation. Normal rectum. Sigmoid diverticula; anastomosis identified. Friability in multiple 3-4 mm anastomotic ulcers          present.  Distal 10 cm of neoterminal ileum appeared normal.   "Pitted"  proximal colonic mucosa. Little patchy   erythema in the more proximal colon as well. No evidence of recurrent polyp or neoplasm.  THERAPEUTIC / DIAGNOSTIC MANEUVERS PERFORMED:  Anastomotic biopsies performed. Biopsies of the proximal colonic mucosa also taken.  COMPLICATIONS:  None  CECAL WITHDRAWAL TIME:  (Withdrawal from anastomosis-14 minutes).  IMPRESSION:   Normal rectum. Sigmoid diverticulosis. Anastomotic ulcers - biopsied. Abnormal colonic because of doubtful clinical significance-status post biopsy.  RECOMMENDATIONS:    CBC today. Daily fiber-Benefiber 2 tablespoons. Followup on pathology.  ______________________________ [name], MD [name]  CC:  [name], M.D.  n. eSIGNED:   [name] at 05/16/2012 08:20 AM  [name], [phone]

## 2012-05-16 NOTE — H&P (View-Only) (Signed)
Primary Care Physician:  [name], MD, MD  Primary Gastroenterologist:  Roetta Sessions, MD   Chief Complaint  Patient presents with  . Rectal Bleeding    HPI:  [name] is a 61 y.o. male here for further evaluation of change in bowel habits and rectal bleeding. Patient has a history of stage II colon cancer status post right hemicolectomy in 2007. Last colonoscopy May 2011. At that time he had some angry appearing anastomotic mucosa with erosions and using. Biopsy was benign.  Patient complains of chronic intermittent rectal bleeding. In January he had hemorrhoidectomy by Dr. Franky Macho. Before hemorrhoid surgery, brbpr dripping into toilet. After surgery, some constipation related to Lithium. Taking suppositories bid to help to go. Had not had formed stools for years until recently. Blood mixed into stool, dark red. No melena. Sometimes just blood on tissue. Chalky or yellowish stools. First stool solid and then very mushy. No abdominal pain in the last six months. Prior to this some intermittent sharp LLQ pain. Occasional heartburn. No dysphagia. Denies weight loss.  Current Outpatient Prescriptions  Medication Sig Dispense Refill  . amitriptyline (ELAVIL) 50 MG tablet Take 250 mg by mouth at bedtime.       . CYANOCOBALAMIN IJ Inject 1,000 mcg as directed every 30 (thirty) days.        Marland Kitchen lamoTRIgine (LAMICTAL) 100 MG tablet Take 150 mg by mouth daily.       . metFORMIN (GLUCOPHAGE) 500 MG tablet Take 500 mg by mouth daily with breakfast.       . timolol (TIMOPTIC) 0.5 % ophthalmic solution Place 1 drop into both eyes daily.       Marland Kitchen lithium carbonate (LITHOBID) 300 MG CR tablet Take 300 mg by mouth 2 (two) times daily. 2 in am and 3 at night      . oxyCODONE-acetaminophen (PERCOCET) 7.5-325 MG per tablet Take 1-2 tablets by mouth every 4 (four) hours as needed for pain.  50 tablet  0    Allergies as of 04/25/2012 - Review Complete 04/25/2012  Allergen Reaction Noted  .  Hydromorphone hcl Other (See Comments) 08/14/2009    Past Medical History  Diagnosis Date  . Colon cancer     stage II, diagnosed 2007  . BPH (benign prostatic hyperplasia)   . Staph infection 2008    left side of face  . Depression   . Glaucoma 2012  . Sleep apnea     wears CiPaP at night  . Shortness of breath     with exertion  . Diabetes mellitus   . Complication of anesthesia     vagal response after umbilcal hernia at [facility]    Past Surgical History  Procedure Date  . Kidney stone surgery   . Umbilical hernia repair   . Colectomy 2007    right hemicolectomy  . Skin lesion excision     left shoulder/pre cancerous  . Pilonidal cyst / sinus excision 1976  . Appendectomy   . Cardiac catheterization   . Hernia repair     umbilical  . Hemorrhoid surgery 12/31/2011    Procedure: HEMORRHOIDECTOMY;  Surgeon: [name];  Location: [facility];  Service: General;  Laterality: N/A;  . Colonoscopy 05/11/10    normal rectum,normal residual colon with some angry appearring anastomtic mucosa with some erosions and oozing, bx unremarkable    Family History  Problem Relation Age of Onset  . Anesthesia problems Neg Hx   . Hypotension Neg Hx   .  Malignant hyperthermia Neg Hx   . Pseudochol deficiency Neg Hx   . Lung cancer Father   . Diabetes Mother     History   Social History  . Marital Status: Married    Spouse Name: N/A    Number of Children: 1  . Years of Education: N/A   Occupational History  . school system    Social History Main Topics  . Smoking status: Never Smoker   . Smokeless tobacco: Not on file  . Alcohol Use: No  . Drug Use: No  . Sexually Active:    Other Topics Concern  . Not on file   Social History Narrative  . No narrative on file      ROS:  General: Negative for anorexia, weight loss, fever, chills, fatigue, weakness. Eyes: Negative for vision changes.  ENT: Negative for hoarseness, difficulty swallowing , nasal congestion. CV:  Negative for chest pain, angina, palpitations, dyspnea on exertion, peripheral edema.  Respiratory: Negative for dyspnea at rest, dyspnea on exertion, cough, sputum, wheezing.  GI: See history of present illness. GU:  Negative for dysuria, hematuria, urinary incontinence, urinary frequency, nocturnal urination.  MS: Negative for joint pain, low back pain.  Derm: Negative for rash or itching.  Neuro: Negative for weakness, abnormal sensation, seizure, frequent headaches, memory loss, confusion.  Psych: Negative for anxiety, depression, suicidal ideation, hallucinations.  Endo: Negative for unusual weight change.  Heme: Negative for bruising or bleeding. Allergy: Negative for rash or hives.    Physical Examination:  BP 135/85  Pulse 85  Temp(Src) 97 F (36.1 C) (Temporal)  Ht 5\' 9"  (1.753 m)  Wt 235 lb (106.595 kg)  BMI 34.70 kg/m2   General: Well-nourished, well-developed in no acute distress.  Head: Normocephalic, atraumatic.   Eyes: Conjunctiva pink, no icterus. Mouth: Oropharyngeal mucosa moist and pink , no lesions erythema or exudate. Neck: Supple without thyromegaly, masses, or lymphadenopathy.  Lungs: Clear to auscultation bilaterally.  Heart: Regular rate and rhythm, no murmurs rubs or gallops.  Abdomen: Bowel sounds are normal, nontender, nondistended, no hepatosplenomegaly or masses, no abdominal bruits or    hernia , no rebound or guarding.   Rectal: deferred at time of colonoscopy Extremities: No lower extremity edema. No clubbing or deformities.  Neuro: Alert and oriented x 4 , grossly normal neurologically.  Skin: Warm and dry, no rash or jaundice.   Psych: Alert and cooperative, normal mood and affect.  Labs: Lab Results  Component Value Date   WBC 6.7 12/29/2011   HGB 13.9 12/31/2011   HCT 41.0 12/31/2011   MCV 87.3 12/29/2011   PLT 198 12/29/2011   Lab Results  Component Value Date   CEA 1.1 11/16/2011

## 2012-05-16 NOTE — Discharge Instructions (Addendum)
Colonoscopy Discharge Instructions  Read the instructions outlined below and refer to this sheet in the next few weeks. These discharge instructions provide you with general information on caring for yourself after you leave the hospital. Your doctor may also give you specific instructions. While your treatment has been planned according to the most current medical practices available, unavoidable complications occasionally occur. If you have any problems or questions after discharge, call Dr. Jena Gauss at [phone]. ACTIVITY  You may resume your regular activity, but move at a slower pace for the next 24 hours.   Take frequent rest periods for the next 24 hours.   Walking will help get rid of the air and reduce the bloated feeling in your belly (abdomen).   No driving for 24 hours (because of the medicine (anesthesia) used during the test).    Do not sign any important legal documents or operate any machinery for 24 hours (because of the anesthesia used during the test).  NUTRITION  Drink plenty of fluids.   You may resume your normal diet as instructed by your doctor.   Begin with a light meal and progress to your normal diet. Heavy or fried foods are harder to digest and may make you feel sick to your stomach (nauseated).   Avoid alcoholic beverages for 24 hours or as instructed.  MEDICATIONS  You may resume your normal medications unless your doctor tells you otherwise.  WHAT YOU CAN EXPECT TODAY  Some feelings of bloating in the abdomen.   Passage of more gas than usual.   Spotting of blood in your stool or on the toilet paper.  IF YOU HAD POLYPS REMOVED DURING THE COLONOSCOPY:  No aspirin products for 7 days or as instructed.   No alcohol for 7 days or as instructed.   Eat a soft diet for the next 24 hours.  FINDING OUT THE RESULTS OF YOUR TEST Not all test results are available during your visit. If your test results are not back during the visit, make an appointment  with your caregiver to find out the results. Do not assume everything is normal if you have not heard from your caregiver or the medical facility. It is important for you to follow up on all of your test results.  SEEK IMMEDIATE MEDICAL ATTENTION IF:  You have more than a spotting of blood in your stool.   Your belly is swollen (abdominal distention).   You are nauseated or vomiting.   You have a temperature over 101.   You have abdominal pain or discomfort that is severe or gets worse throughout the day.   Repeat colonoscopy in 5 years.  Diverticulosis information.  Daily fiber-2 tablespoons of Benefiber each day.  CBC today  Further recommendations to follow pending review of pathology report.  Diverticulosis Diverticulosis is a common condition that develops when small pouches (diverticula) form in the wall of the colon. The risk of diverticulosis increases with age. It happens more often in people who eat a low-fiber diet. Most individuals with diverticulosis have no symptoms. Those individuals with symptoms usually experience abdominal pain, constipation, or loose stools (diarrhea). HOME CARE INSTRUCTIONS   Increase the amount of fiber in your diet as directed by your caregiver or dietician. This may reduce symptoms of diverticulosis.   Your caregiver may recommend taking a dietary fiber supplement.   Drink at least 6 to 8 glasses of water each day to prevent constipation.   Try not to strain when you have a bowel  movement.   Your caregiver may recommend avoiding nuts and seeds to prevent complications, although this is still an uncertain benefit.   Only take over-the-counter or prescription medicines for pain, discomfort, or fever as directed by your caregiver.  FOODS WITH HIGH FIBER CONTENT INCLUDE:  Fruits. Apple, peach, pear, tangerine, raisins, prunes.   Vegetables. Brussels sprouts, asparagus, broccoli, cabbage, carrot, cauliflower, romaine lettuce, spinach,  summer squash, tomato, winter squash, zucchini.   Starchy Vegetables. Baked beans, kidney beans, lima beans, split peas, lentils, potatoes (with skin).   Grains. Whole wheat bread, brown rice, bran flake cereal, plain oatmeal, white rice, shredded wheat, bran muffins.  SEEK IMMEDIATE MEDICAL CARE IF:   You develop increasing pain or severe bloating.   You have an oral temperature above 102 F (38.9 C), not controlled by medicine.   You develop vomiting or bowel movements that are bloody or black.  Document Released: 09/02/2004 Document Revised: 11/25/2011 Document Reviewed: 05/06/2010 [facility] Patient Information 2012 [place], [place].

## 2012-05-16 NOTE — Interval H&P Note (Signed)
History and Physical Interval Note:  05/16/2012 7:34 AM  [name]  has presented today for surgery, with the diagnosis of rectal bleeding, change in bowel habits  The various methods of treatment have been discussed with the patient and family. After consideration of risks, benefits and other options for treatment, the patient has consented to  Procedure(s) (LRB): COLONOSCOPY (N/A) as a surgical intervention .  The patients' history has been reviewed, patient examined, no change in status, stable for surgery.  I have reviewed the patients' chart and labs.  Questions were answered to the patient's satisfaction.     Eula Listen

## 2012-05-18 ENCOUNTER — Encounter (HOSPITAL_COMMUNITY): Payer: Self-pay | Admitting: Internal Medicine

## 2012-05-18 ENCOUNTER — Ambulatory Visit (HOSPITAL_COMMUNITY): Payer: BC Managed Care – PPO

## 2012-05-22 ENCOUNTER — Encounter: Payer: Self-pay | Admitting: Internal Medicine

## 2012-05-23 NOTE — Progress Notes (Signed)
Please see letter to pt dated today. Please also let pt know cbc from 5/28 completely normal and cc pcp

## 2012-05-23 NOTE — Progress Notes (Signed)
Pt is aware. cc'd lab to pcp.

## 2012-05-29 ENCOUNTER — Encounter (HOSPITAL_COMMUNITY): Payer: BC Managed Care – PPO | Attending: Oncology

## 2012-05-29 DIAGNOSIS — C189 Malignant neoplasm of colon, unspecified: Secondary | ICD-10-CM | POA: Insufficient documentation

## 2012-05-29 DIAGNOSIS — C182 Malignant neoplasm of ascending colon: Secondary | ICD-10-CM

## 2012-05-29 NOTE — Progress Notes (Signed)
[name] presented for labwork. Labs per MD order drawn via Peripheral Line 23 gauge needle inserted in left hand  Good blood return present. Procedure without incident.  Needle removed intact. Patient tolerated procedure well.   

## 2012-08-01 ENCOUNTER — Telehealth (HOSPITAL_COMMUNITY): Payer: Self-pay

## 2012-08-03 ENCOUNTER — Other Ambulatory Visit (HOSPITAL_COMMUNITY): Payer: Self-pay | Admitting: Oncology

## 2012-08-03 DIAGNOSIS — C189 Malignant neoplasm of colon, unspecified: Secondary | ICD-10-CM

## 2012-08-03 DIAGNOSIS — E538 Deficiency of other specified B group vitamins: Secondary | ICD-10-CM

## 2012-08-03 MED ORDER — CYANOCOBALAMIN 1000 MCG/ML IJ SOLN: 1000.0000 ug | INTRAMUSCULAR | Status: DC

## 2012-11-13 ENCOUNTER — Encounter (HOSPITAL_COMMUNITY): Payer: BC Managed Care – PPO | Attending: Oncology

## 2012-11-13 DIAGNOSIS — C189 Malignant neoplasm of colon, unspecified: Secondary | ICD-10-CM

## 2012-11-13 DIAGNOSIS — Z85038 Personal history of other malignant neoplasm of large intestine: Secondary | ICD-10-CM | POA: Insufficient documentation

## 2012-11-13 DIAGNOSIS — C182 Malignant neoplasm of ascending colon: Secondary | ICD-10-CM

## 2012-11-13 DIAGNOSIS — Z09 Encounter for follow-up examination after completed treatment for conditions other than malignant neoplasm: Secondary | ICD-10-CM | POA: Insufficient documentation

## 2012-11-13 LAB — COMPREHENSIVE METABOLIC PANEL
ALT: 75 U/L — ABNORMAL HIGH (ref 0–53)
AST: 48 U/L — ABNORMAL HIGH (ref 0–37)
Albumin: 3.9 g/dL (ref 3.5–5.2)
CO2: 27 mEq/L (ref 19–32)
Calcium: 9.4 mg/dL (ref 8.4–10.5)
Chloride: 100 mEq/L (ref 96–112)
Creatinine, Ser: 0.95 mg/dL (ref 0.50–1.35)
GFR calc non Af Amer: 89 mL/min — ABNORMAL LOW (ref >90)
Sodium: 138 mEq/L (ref 135–145)
Total Bilirubin: 0.3 mg/dL (ref 0.3–1.2)

## 2012-11-13 LAB — CBC
MCV: 86.5 fL (ref 78.0–100.0)
Platelets: 201 10*3/uL (ref 150–400)
RBC: 4.83 MIL/uL (ref 4.22–5.81)
RDW: 13.5 % (ref 11.5–15.5)
WBC: 5.3 10*3/uL (ref 4.0–10.5)

## 2012-11-13 NOTE — Progress Notes (Signed)
[name] presented for labwork. Labs per MD order drawn via Peripheral Line 23 gauge needle inserted in right AC  Good blood return present. Procedure without incident.  Needle removed intact. Patient tolerated procedure well.

## 2012-11-15 ENCOUNTER — Encounter (HOSPITAL_COMMUNITY): Payer: Self-pay | Admitting: Oncology

## 2012-11-15 ENCOUNTER — Ambulatory Visit (HOSPITAL_COMMUNITY): Payer: BC Managed Care – PPO | Admitting: Oncology

## 2012-11-15 ENCOUNTER — Encounter (HOSPITAL_BASED_OUTPATIENT_CLINIC_OR_DEPARTMENT_OTHER): Payer: BC Managed Care – PPO | Admitting: Oncology

## 2012-11-15 VITALS — BP 139/77 | HR 70 | Temp 97.4°F | Resp 16 | Wt 233.4 lb

## 2012-11-15 DIAGNOSIS — C189 Malignant neoplasm of colon, unspecified: Secondary | ICD-10-CM

## 2012-11-15 DIAGNOSIS — Z85038 Personal history of other malignant neoplasm of large intestine: Secondary | ICD-10-CM

## 2012-11-15 NOTE — Patient Instructions (Addendum)
[facility]  Discharge Instructions  RECOMMENDATIONS MADE BY THE CONSULTANT AND ANY TEST RESULTS WILL BE SENT TO YOUR REFERRING DOCTOR.   EXAM FINDINGS BY MD TODAY AND SIGNS AND SYMPTOMS TO REPORT TO CLINIC OR PRIMARY MD: exam and discussion by MD.  Bonita Quin are doing well.  2 of your liver enzymes are elevated - same as before.  Will do CEA in months and 12 months.  MEDICATIONS PRESCRIBED: none   INSTRUCTIONS GIVEN AND DISCUSSED: Other :  Report changes in bowel habits, blood in your bowel movement or other problems.  SPECIAL INSTRUCTIONS/FOLLOW-UP: Lab work Needed in 6 months and 1 year and Return to Clinic in 1 year after labs to see MD.  If all is well we will release you from the clinic.   I acknowledge that I have been informed and understand all the instructions given to me and received a copy. I do not have any more questions at this time, but understand that I may call the Specialty Clinic at [facility] at [phone] during business hours should I have any further questions or need assistance in obtaining follow-up care.    __________________________________________  _____________  __________ Signature of Patient or Authorized Representative            Date                   Time    __________________________________________ Nurse's Signature

## 2012-11-15 NOTE — Progress Notes (Signed)
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 19 negative nodes and he had surgery on 04/19/2006. He was offered a protocol but declined. He has not had any evidence recurrence. He had CAT scan in December 2012 still without evidence for recurrence. I will not do those studies again unless those laboratory studies of his change. He will have a CEA in 6 months and 12 months and when I see him in 12 months he will be out 7-1/2 years. If all is well our release him from this clinic at that time. His review of systems from an oncology standpoint is negative.  His vital signs are stable. Unfortunately his weight remains too high for his height. I can't feel his liver edge 1 cm to 2 cm below the right costal margin on deep inspiration. I cannot feel his spleen. He has no lymphadenopathy. He has skin changes consistent with psoriasis. He has no obvious malignant skin changes. Lungs are clear. Heart shows a regular rhythm and rate without murmur rub or gallop. He has no leg edema no arm edema. He has no lymphadenopathy in any location.  I will see him back in a year but he will come in 6 months for CEA level

## 2012-12-02 ENCOUNTER — Encounter (HOSPITAL_COMMUNITY): Payer: Self-pay | Admitting: Emergency Medicine

## 2012-12-02 ENCOUNTER — Emergency Department (HOSPITAL_COMMUNITY): Payer: BC Managed Care – PPO

## 2012-12-02 ENCOUNTER — Emergency Department (HOSPITAL_COMMUNITY)
Admission: EM | Admit: 2012-12-02 | Discharge: 2012-12-02 | Disposition: A | Discharge status: Home / Self Care | Payer: BC Managed Care – PPO | Attending: Emergency Medicine | Admitting: Emergency Medicine

## 2012-12-02 ENCOUNTER — Other Ambulatory Visit: Payer: Self-pay

## 2012-12-02 DIAGNOSIS — Z79899 Other long term (current) drug therapy: Secondary | ICD-10-CM | POA: Insufficient documentation

## 2012-12-02 DIAGNOSIS — F329 Major depressive disorder, single episode, unspecified: Secondary | ICD-10-CM | POA: Insufficient documentation

## 2012-12-02 DIAGNOSIS — Z9889 Other specified postprocedural states: Secondary | ICD-10-CM | POA: Insufficient documentation

## 2012-12-02 DIAGNOSIS — E119 Type 2 diabetes mellitus without complications: Secondary | ICD-10-CM | POA: Insufficient documentation

## 2012-12-02 DIAGNOSIS — F3289 Other specified depressive episodes: Secondary | ICD-10-CM | POA: Insufficient documentation

## 2012-12-02 DIAGNOSIS — J159 Unspecified bacterial pneumonia: Secondary | ICD-10-CM | POA: Insufficient documentation

## 2012-12-02 DIAGNOSIS — H409 Unspecified glaucoma: Secondary | ICD-10-CM | POA: Insufficient documentation

## 2012-12-02 DIAGNOSIS — Z85038 Personal history of other malignant neoplasm of large intestine: Secondary | ICD-10-CM | POA: Insufficient documentation

## 2012-12-02 DIAGNOSIS — R059 Cough, unspecified: Secondary | ICD-10-CM | POA: Insufficient documentation

## 2012-12-02 DIAGNOSIS — J189 Pneumonia, unspecified organism: Secondary | ICD-10-CM

## 2012-12-02 DIAGNOSIS — R05 Cough: Secondary | ICD-10-CM | POA: Insufficient documentation

## 2012-12-02 DIAGNOSIS — Z8619 Personal history of other infectious and parasitic diseases: Secondary | ICD-10-CM | POA: Insufficient documentation

## 2012-12-02 DIAGNOSIS — Z87448 Personal history of other diseases of urinary system: Secondary | ICD-10-CM | POA: Insufficient documentation

## 2012-12-02 LAB — CBC WITH DIFFERENTIAL/PLATELET
Eosinophils Absolute: 0.2 10*3/uL (ref 0.0–0.7)
Eosinophils Relative: 3 % (ref 0–5)
HCT: 40.5 % (ref 39.0–52.0)
Lymphocytes Relative: 32 % (ref 12–46)
Lymphs Abs: 1.9 10*3/uL (ref 0.7–4.0)
MCH: 30.9 pg (ref 26.0–34.0)
MCV: 85.8 fL (ref 78.0–100.0)
Monocytes Absolute: 0.7 10*3/uL (ref 0.1–1.0)
RBC: 4.72 MIL/uL (ref 4.22–5.81)
WBC: 5.9 10*3/uL (ref 4.0–10.5)

## 2012-12-02 LAB — BASIC METABOLIC PANEL
BUN: 16 mg/dL (ref 6–23)
CO2: 26 mEq/L (ref 19–32)
Calcium: 9.1 mg/dL (ref 8.4–10.5)
Creatinine, Ser: 0.88 mg/dL (ref 0.50–1.35)
GFR calc non Af Amer: >90 mL/min (ref >90)
Glucose, Bld: 115 mg/dL — ABNORMAL HIGH (ref 70–99)

## 2012-12-02 MED ORDER — ASPIRIN 81 MG PO CHEW
324.0000 mg | CHEWABLE_TABLET | Freq: Once | ORAL | Status: AC
  Administered 2012-12-02: 324 mg via ORAL
  Filled 2012-12-02: qty 4

## 2012-12-02 MED ORDER — AZITHROMYCIN 250 MG PO TABS
500.0000 mg | ORAL_TABLET | Freq: Once | ORAL | Status: AC
  Administered 2012-12-02: 500 mg via ORAL
  Filled 2012-12-02: qty 2

## 2012-12-02 MED ORDER — AZITHROMYCIN 250 MG PO TABS: ORAL_TABLET | ORAL | Status: DC

## 2012-12-02 MED ORDER — DEXTROSE 5 % IV SOLN
1.0000 g | Freq: Once | INTRAVENOUS | Status: AC
  Administered 2012-12-02: 1 g via INTRAVENOUS
  Filled 2012-12-02: qty 10

## 2012-12-02 MED ORDER — CEFTRIAXONE SODIUM 1 G IJ SOLR: 1.0000 g | Freq: Once | INTRAMUSCULAR | Status: DC

## 2012-12-02 NOTE — ED Provider Notes (Signed)
History     CSN: [id number]  Arrival date & time 12/02/12  0903   First MD Initiated Contact with Patient 12/02/12 [phone]      Chief Complaint  Patient presents with  . Chest Pain    (Consider location/radiation/quality/duration/timing/severity/associated sxs/prior treatment) HPI Comments: States he has 4-5 episodes of mid-sternal pain in the past 5 days.  Each episode 2-3 seconds and no-radiating.  Difficult for pt to qualifying/describing his pain.  Reports having "arteries of a baby" per his cardiac cath 7 yrs ago by [place] [facility].  Has PCP in [place].  Patient is a 61 y.o. male presenting with chest pain. The history is provided by the patient. No language interpreter was used.  Chest Pain Duration of episode(s) is 3 seconds. Chest pain occurs intermittently. The severity of the pain is moderate. The quality of the pain is described as brief. The pain does not radiate. Primary symptoms include cough. Pertinent negatives for primary symptoms include no fever, no syncope, no shortness of breath, no wheezing, no palpitations, no abdominal pain, no nausea, no vomiting, no dizziness and no altered mental status.  Pertinent negatives for associated symptoms include no diaphoresis, no lower extremity edema, no near-syncope and no weakness. He tried nothing for the symptoms. Risk factors include obesity and male gender.     Past Medical History  Diagnosis Date  . Colon cancer     stage II, diagnosed 2007  . BPH (benign prostatic hyperplasia)   . Staph infection 2008    left side of face  . Depression   . Glaucoma(365) 2012  . Sleep apnea     wears CiPaP at night  . Diabetes mellitus   . Complication of anesthesia     vagal response after umbilcal hernia at [facility]    Past Surgical History  Procedure Date  . Kidney stone surgery   . Colectomy 2007    right hemicolectomy  . Skin lesion excision     left shoulder/pre cancerous  . Pilonidal cyst / sinus excision 1976  .  Appendectomy   . Cardiac catheterization   . Hernia repair     umbilical  . Hemorrhoid surgery 12/31/2011    Procedure: HEMORRHOIDECTOMY;  Surgeon: [name];  Location: [facility] ORS;  Service: General;  Laterality: N/A;  . Colonoscopy 05/11/10    normal rectum,normal residual colon with some angry appearring anastomtic mucosa with some erosions and oozing, bx unremarkable  . Colonoscopy 05/16/2012    Procedure: COLONOSCOPY;  Surgeon: Corbin Ade, MD;  Location: [facility];  Service: Endoscopy;  Laterality: N/A;  7:30    Family History  Problem Relation Age of Onset  . Anesthesia problems Neg Hx   . Hypotension Neg Hx   . Malignant hyperthermia Neg Hx   . Pseudochol deficiency Neg Hx   . Lung cancer Father   . Diabetes Mother     History  Substance Use Topics  . Smoking status: Never Smoker   . Smokeless tobacco: Never Used  . Alcohol Use: No      Review of Systems  Constitutional: Negative for fever and diaphoresis.  Respiratory: Positive for cough. Negative for shortness of breath and wheezing.   Cardiovascular: Positive for chest pain. Negative for palpitations, syncope and near-syncope.  Gastrointestinal: Negative for nausea, vomiting and abdominal pain.  Neurological: Negative for dizziness and weakness.  Psychiatric/Behavioral: Negative for altered mental status.  All other systems reviewed and are negative.    Allergies  Hydromorphone hcl  Home  Medications   Current Outpatient Rx  Name  Route  Sig  Dispense  Refill  . ALPRAZOLAM 0.5 MG PO TABS   Oral   Take 0.5 mg by mouth daily as needed. For anxiety or sleep         . AMITRIPTYLINE HCL 50 MG PO TABS   Oral   Take 150 mg by mouth at bedtime.          Marland Kitchen CLONIDINE HCL 0.1 MG PO TABS   Oral   Take 0.2 mg by mouth Daily.          . CYANOCOBALAMIN 1000 MCG/ML IJ SOLN   Subcutaneous   Inject 1 mL (1,000 mcg total) into the skin every 30 (thirty) days.   12 mL   0   . METFORMIN HCL 500 MG PO  TABS   Oral   Take 500 mg by mouth daily with breakfast.          . ADULT MULTIVITAMIN W/MINERALS CH   Oral   Take 1 tablet by mouth daily.         Marland Kitchen TIMOLOL MALEATE 0.5 % OP SOLN   Both Eyes   Place 1 drop into both eyes daily.          . AZITHROMYCIN 250 MG PO TABS      One tab po QD (initial dose given in the ED)   4 tablet   0     BP 143/89  Pulse 69  Temp 97.5 F (36.4 C) (Oral)  Resp 16  SpO2 96%  Physical Exam  Nursing note and vitals reviewed. Constitutional: He is oriented to person, place, and time. Vital signs are normal. He appears well-developed and well-nourished. He is cooperative.  Non-toxic appearance. He does not have a sickly appearance. He does not appear ill. No distress.  HENT:  Head: Normocephalic and atraumatic.  Eyes: EOM are normal.  Neck: Normal range of motion.  Cardiovascular: Normal rate, regular rhythm, normal heart sounds, intact distal pulses and normal pulses.  Exam reveals no decreased pulses.   Pulses:      Carotid pulses are 2+ on the right side.      Radial pulses are 2+ on the right side.  Pulmonary/Chest: Effort normal and breath sounds normal. No respiratory distress. He has no wheezes. He has no rales. He exhibits no tenderness.    Abdominal: Soft. He exhibits no distension. There is no tenderness.  Musculoskeletal: Normal range of motion.  Neurological: He is alert and oriented to person, place, and time.  Skin: Skin is warm and dry.  Psychiatric: He has a normal mood and affect. Judgment normal.    ED Course  Procedures (including critical care time)  Labs Reviewed  BASIC METABOLIC PANEL - Abnormal; Notable for the following:    Glucose, Bld 115 (*)     All other components within normal limits  CBC WITH DIFFERENTIAL  TROPONIN I   Dg Chest Portable 1 View  12/02/2012  *RADIOLOGY REPORT*  Clinical Data: Chest pain  PORTABLE CHEST - 1 VIEW  Comparison: 04/18/2006  Findings: Mild focal/patchy right upper lobe  opacity, possibly infectious.  No pleural effusion or pneumothorax.  The heart is normal in size.  IMPRESSION: Focal/patchy right upper lobe opacity, possibly infectious.  Follow-up chest radiographs are suggested to document clearance.   Original Report Authenticated By: Charline Bills, M.D.     Date: 12/02/2012  Rate: 79  Rhythm: normal sinus rhythm  QRS Axis: normal  Intervals: normal  ST/T Wave abnormalities: normal  Conduction Disutrbances:none  Narrative Interpretation:   Old EKG Reviewed: unchanged from 12-29-11    1. Community acquired pneumonia       MDM  Pt is stable and has no SOB.  V/s and O2 sats are normal. rx-zithromax 250 mg QD x 4 days F/u with PCP        Evalina Field, PA 12/02/12 1237

## 2012-12-02 NOTE — ED Provider Notes (Signed)
Medical screening examination/treatment/procedure(s) were performed by non-physician practitioner and as supervising physician I was immediately available for consultation/collaboration. Devoria Albe, MD, Armando Gang   Ward Givens, MD 12/02/12 [phone]

## 2012-12-02 NOTE — ED Notes (Signed)
Pt c/o nonprod cough x 2-3 weeks. C/o dull/ache to center of chest intermittant x 3 days. Denies n/v/d/dizziness/sweating. Pt color wnl. Mm moist. Nondiaphoretic. nad at this time. Chest ache now is 3

## 2012-12-06 ENCOUNTER — Emergency Department (HOSPITAL_COMMUNITY)
Admission: EM | Admit: 2012-12-06 | Discharge: 2012-12-06 | Disposition: A | Discharge status: Home / Self Care | Payer: BC Managed Care – PPO | Attending: Emergency Medicine | Admitting: Emergency Medicine

## 2012-12-06 ENCOUNTER — Encounter: Payer: Self-pay | Admitting: Physician Assistant

## 2012-12-06 ENCOUNTER — Encounter (HOSPITAL_COMMUNITY): Payer: Self-pay | Admitting: Emergency Medicine

## 2012-12-06 ENCOUNTER — Emergency Department (HOSPITAL_COMMUNITY): Payer: BC Managed Care – PPO

## 2012-12-06 DIAGNOSIS — R079 Chest pain, unspecified: Secondary | ICD-10-CM

## 2012-12-06 DIAGNOSIS — N138 Other obstructive and reflux uropathy: Secondary | ICD-10-CM | POA: Insufficient documentation

## 2012-12-06 DIAGNOSIS — Z9089 Acquired absence of other organs: Secondary | ICD-10-CM | POA: Insufficient documentation

## 2012-12-06 DIAGNOSIS — Z9889 Other specified postprocedural states: Secondary | ICD-10-CM | POA: Insufficient documentation

## 2012-12-06 DIAGNOSIS — Z79899 Other long term (current) drug therapy: Secondary | ICD-10-CM | POA: Insufficient documentation

## 2012-12-06 DIAGNOSIS — G4730 Sleep apnea, unspecified: Secondary | ICD-10-CM | POA: Insufficient documentation

## 2012-12-06 DIAGNOSIS — N401 Enlarged prostate with lower urinary tract symptoms: Secondary | ICD-10-CM | POA: Insufficient documentation

## 2012-12-06 DIAGNOSIS — Z85038 Personal history of other malignant neoplasm of large intestine: Secondary | ICD-10-CM | POA: Insufficient documentation

## 2012-12-06 DIAGNOSIS — E119 Type 2 diabetes mellitus without complications: Secondary | ICD-10-CM | POA: Insufficient documentation

## 2012-12-06 DIAGNOSIS — Z8669 Personal history of other diseases of the nervous system and sense organs: Secondary | ICD-10-CM | POA: Insufficient documentation

## 2012-12-06 DIAGNOSIS — Z8619 Personal history of other infectious and parasitic diseases: Secondary | ICD-10-CM | POA: Insufficient documentation

## 2012-12-06 DIAGNOSIS — F3289 Other specified depressive episodes: Secondary | ICD-10-CM | POA: Insufficient documentation

## 2012-12-06 DIAGNOSIS — F329 Major depressive disorder, single episode, unspecified: Secondary | ICD-10-CM | POA: Insufficient documentation

## 2012-12-06 DIAGNOSIS — R0789 Other chest pain: Secondary | ICD-10-CM | POA: Insufficient documentation

## 2012-12-06 LAB — CBC
HCT: 40.6 % (ref 39.0–52.0)
MCH: 31.2 pg (ref 26.0–34.0)
MCV: 86.2 fL (ref 78.0–100.0)
Platelets: 164 10*3/uL (ref 150–400)
RDW: 13.2 % (ref 11.5–15.5)
WBC: 6.1 10*3/uL (ref 4.0–10.5)

## 2012-12-06 LAB — TROPONIN I: Troponin I: <0.3 ng/mL (ref <0.30)

## 2012-12-06 LAB — BASIC METABOLIC PANEL
BUN: 18 mg/dL (ref 6–23)
Calcium: 9.3 mg/dL (ref 8.4–10.5)
Chloride: 101 mEq/L (ref 96–112)
Creatinine, Ser: 0.92 mg/dL (ref 0.50–1.35)
GFR calc Af Amer: >90 mL/min (ref >90)

## 2012-12-06 MED ORDER — ASPIRIN 81 MG PO CHEW
324.0000 mg | CHEWABLE_TABLET | Freq: Once | ORAL | Status: AC
  Administered 2012-12-06: 324 mg via ORAL
  Filled 2012-12-06: qty 4

## 2012-12-06 MED ORDER — NITROGLYCERIN 0.4 MG SL SUBL
0.4000 mg | SUBLINGUAL_TABLET | SUBLINGUAL | Status: DC | PRN
  Administered 2012-12-06: 0.4 mg via SUBLINGUAL
  Filled 2012-12-06: qty 25

## 2012-12-06 MED ORDER — MORPHINE SULFATE 4 MG/ML IJ SOLN
2.0000 mg | Freq: Once | INTRAMUSCULAR | Status: AC
  Administered 2012-12-06: 2 mg via INTRAVENOUS
  Filled 2012-12-06: qty 1

## 2012-12-06 MED ORDER — SODIUM CHLORIDE 0.9 % IV SOLN
Freq: Once | INTRAVENOUS | Status: AC
  Administered 2012-12-06: 06:00:00 via INTRAVENOUS

## 2012-12-06 MED ORDER — ONDANSETRON HCL 4 MG/2ML IJ SOLN
4.0000 mg | Freq: Once | INTRAMUSCULAR | Status: AC
  Administered 2012-12-06: 4 mg via INTRAVENOUS
  Filled 2012-12-06: qty 2

## 2012-12-06 MED ORDER — HYDROCODONE-ACETAMINOPHEN 5-325 MG PO TABS: 1.0000 | ORAL_TABLET | ORAL | Status: AC | PRN

## 2012-12-06 NOTE — ED Notes (Signed)
Patient resting comfortably in bed with eyes closed.  Patient rates pain as 5/10.

## 2012-12-06 NOTE — ED Notes (Signed)
Patient c/o chest pain that started at midnight.  States was seen here on Saturday for same pain.  Patient states has stress test scheduled for Thursday at [place].

## 2012-12-06 NOTE — ED Provider Notes (Signed)
History     CSN: [id number]  Arrival date & time 12/06/12  0411   First MD Initiated Contact with Patient 12/06/12 [phone]      Chief Complaint  Patient presents with  . Chest Pain    (Consider location/radiation/quality/duration/timing/severity/associated sxs/prior treatment) HPI [name] is a 61 y.o. male  With a h/o DM, sleep apnea, brought in by ambulance, who presents to the Emergency Department complaining of  Chest pain that has been present for several days. Pain is sharp occurs intermittently and extends from shoulder to shoulder across the anterior chest.  He was seen in the ER 12/02/12 for the same. Labs, EKG, and cardiac marker were normal. His chest xray showed what might have been a beginning CAP. He was begum on antibiotic therapy which he has taken.He followed up with his PCP who has scheduled him for a stress test on Thursday. Tonight he develop worsening chest pain that has remained constant. He has taken no medicines. He denies diaphoresis, fever, chills, cough, shortness of breath.  PCP Dr. Dimas Aguas   Past Medical History  Diagnosis Date  . Colon cancer     stage II, diagnosed 2007  . BPH (benign prostatic hyperplasia)   . Staph infection 2008    left side of face  . Depression   . Glaucoma(365) 2012  . Sleep apnea     wears CiPaP at night  . Diabetes mellitus   . Complication of anesthesia     vagal response after umbilcal hernia at [facility]    Past Surgical History  Procedure Date  . Kidney stone surgery   . Colectomy 2007    right hemicolectomy  . Skin lesion excision     left shoulder/pre cancerous  . Pilonidal cyst / sinus excision 1976  . Appendectomy   . Cardiac catheterization   . Hernia repair     umbilical  . Hemorrhoid surgery 12/31/2011    Procedure: HEMORRHOIDECTOMY;  Surgeon: [name];  Location: [facility];  Service: General;  Laterality: N/A;  . Colonoscopy 05/11/10    normal rectum,normal residual colon with some angry  appearring anastomtic mucosa with some erosions and oozing, bx unremarkable  . Colonoscopy 05/16/2012    Procedure: COLONOSCOPY;  Surgeon: Corbin Ade, MD;  Location: [facility];  Service: Endoscopy;  Laterality: N/A;  7:30    Family History  Problem Relation Age of Onset  . Anesthesia problems Neg Hx   . Hypotension Neg Hx   . Malignant hyperthermia Neg Hx   . Pseudochol deficiency Neg Hx   . Lung cancer Father   . Diabetes Mother     History  Substance Use Topics  . Smoking status: Never Smoker   . Smokeless tobacco: Never Used  . Alcohol Use: No      Review of Systems  Constitutional: Negative for fever.       10 Systems reviewed and are negative for acute change except as noted in the HPI.  HENT: Negative for congestion.   Eyes: Negative for discharge and redness.  Respiratory: Negative for cough and shortness of breath.   Cardiovascular: Positive for chest pain.  Gastrointestinal: Negative for vomiting and abdominal pain.  Musculoskeletal: Negative for back pain.  Skin: Negative for rash.  Neurological: Negative for syncope, numbness and headaches.  Psychiatric/Behavioral:       No behavior change.    Allergies  Hydromorphone hcl  Home Medications   Current Outpatient Rx  Name  Route  Sig  Dispense  Refill  . ALPRAZOLAM 0.5 MG PO TABS   Oral   Take 0.5 mg by mouth daily as needed. For anxiety or sleep         . AMITRIPTYLINE HCL 50 MG PO TABS   Oral   Take 150 mg by mouth at bedtime.          . AZITHROMYCIN 250 MG PO TABS      One tab po QD (initial dose given in the ED)   4 tablet   0   . CLONIDINE HCL 0.1 MG PO TABS   Oral   Take 0.2 mg by mouth Daily.          . CYANOCOBALAMIN 1000 MCG/ML IJ SOLN   Subcutaneous   Inject 1 mL (1,000 mcg total) into the skin every 30 (thirty) days.   12 mL   0   . METFORMIN HCL 500 MG PO TABS   Oral   Take 500 mg by mouth daily with breakfast.          . ADULT MULTIVITAMIN W/MINERALS CH    Oral   Take 1 tablet by mouth daily.         [place] Kitchen TIMOLOL MALEATE 0.5 % OP SOLN   Both Eyes   Place 1 drop into both eyes daily.            BP 123/79  Pulse 67  Temp 97.4 F (36.3 C) (Oral)  Resp 18  Ht 5\' 9"  (1.753 m)  Wt 240 lb (108.863 kg)  BMI 35.44 kg/m2  SpO2 98%  Physical Exam  Nursing note and vitals reviewed. Constitutional: He appears well-developed and well-nourished.       Awake, alert, nontoxic appearance.  HENT:  Head: Atraumatic.  Eyes: Right eye exhibits no discharge. Left eye exhibits no discharge.  Neck: Neck supple.  Cardiovascular: Normal heart sounds and intact distal pulses.   Pulmonary/Chest: Effort normal. No respiratory distress. He has no wheezes. He has no rales. He exhibits no tenderness.  Abdominal: Soft. There is no tenderness. There is no rebound.  Musculoskeletal: He exhibits no tenderness.       Baseline ROM, no obvious new focal weakness.  Neurological:       Mental status and motor strength appears baseline for patient and situation.  Skin: No rash noted.  Psychiatric: He has a normal mood and affect.    ED Course  Procedures (including critical care time) Results for orders placed during the hospital encounter of 12/06/12  CBC      Component Value Range   WBC 6.1  4.0 - 10.5 K/uL   RBC 4.71  4.22 - 5.81 MIL/uL   Hemoglobin 14.7  13.0 - 17.0 g/dL   HCT 09.8  11.9 - 14.7 %   MCV 86.2  78.0 - 100.0 fL   MCH 31.2  26.0 - 34.0 pg   MCHC 36.2 (*) 30.0 - 36.0 g/dL   RDW 82.9  56.2 - 13.0 %   Platelets 164  150 - 400 K/uL  BASIC METABOLIC PANEL      Component Value Range   Sodium 136  135 - 145 mEq/L   Potassium 4.4  3.5 - 5.1 mEq/L   Chloride 101  96 - 112 mEq/L   CO2 29  19 - 32 mEq/L   Glucose, Bld 120 (*) 70 - 99 mg/dL   BUN 18  6 - 23 mg/dL   Creatinine, Ser 8.65  0.50 - 1.35 mg/dL  Calcium 9.3  8.4 - 10.5 mg/dL   GFR calc non Af Amer 90 (*) >90 mL/min   GFR calc Af Amer >90  >90 mL/min  TROPONIN I      Component  Value Range   Troponin I <0.30  <0.30 ng/mL    Dg Chest Port 1 View  12/06/2012  *RADIOLOGY REPORT*  Clinical Data: Chest pain  PORTABLE CHEST - 1 VIEW  Comparison: 12/02/2012  Findings: Right suprahilar opacity is less pronounced.  No new areas of consolidation.  Cardiomediastinal contours within normal range.  No pleural effusion or pneumothorax.  No acute osseous finding.  IMPRESSION: Decreased right suprahilar opacity.  No new areas of consolidation.   Original Report Authenticated By: Jearld Lesch, M.D.      Date: 12/06/2012  0417  Rate: 67  Rhythm: normal sinus rhythm  QRS Axis: normal  Intervals: normal  ST/T Wave abnormalities: normal  Conduction Disutrbances:none  Narrative Interpretation: cannot r/o anterior infarct, age undetermined  Old EKG Reviewed: unchanged  0520 SL NTG did little to relieve chest pain. Morphine worked better. Pain now 3/10.  MDM  Patient with recurrent chest pain. He is scheduled for a stress test in two days. This is his second visit to the ER with chest pain. He was diagnosed with a CAP at the last visit and is completing antibiotic. Today's labs, EKG, troponin  and chest xray are normal. Reviewed results with the patient. Pt stable in ED with no significant deterioration in condition.The patient appears reasonably screened and/or stabilized for discharge and I doubt any other medical condition or other [facility] requiring further screening, evaluation, or treatment in the ED at this time prior to discharge.  MDM Reviewed: nursing note, vitals and previous chart Reviewed previous: labs, ECG and x-ray Interpretation: labs, ECG and x-ray  CRITICAL CARE Performed by: Annamarie Dawley. Total critical care time: 30 Critical care time was exclusive of separately billable procedures and treating other patients. Critical care was necessary to treat or prevent imminent or life-threatening deterioration. Critical care was time spent personally by me on the  following activities: development of treatment plan with patient and/or surrogate as well as nursing, discussions with consultants, evaluation of patient's response to treatment, examination of patient, obtaining history from patient or surrogate, ordering and performing treatments and interventions, ordering and review of laboratory studies, ordering and review of radiographic studies, pulse oximetry and re-evaluation of patient's condition. care         Nicoletta Dress. Colon Branch, MD 12/06/12 [phone]

## 2012-12-07 ENCOUNTER — Encounter: Payer: Self-pay | Admitting: Physician Assistant

## 2012-12-07 DIAGNOSIS — R079 Chest pain, unspecified: Secondary | ICD-10-CM

## 2012-12-08 ENCOUNTER — Other Ambulatory Visit: Payer: Self-pay | Admitting: *Deleted

## 2012-12-08 DIAGNOSIS — R072 Precordial pain: Secondary | ICD-10-CM

## 2012-12-14 ENCOUNTER — Other Ambulatory Visit: Payer: Self-pay

## 2012-12-14 ENCOUNTER — Other Ambulatory Visit (INDEPENDENT_AMBULATORY_CARE_PROVIDER_SITE_OTHER): Payer: BC Managed Care – PPO

## 2012-12-14 ENCOUNTER — Other Ambulatory Visit: Payer: BC Managed Care – PPO

## 2012-12-14 DIAGNOSIS — R072 Precordial pain: Secondary | ICD-10-CM

## 2012-12-18 ENCOUNTER — Telehealth: Payer: Self-pay | Admitting: Cardiology

## 2012-12-18 NOTE — Telephone Encounter (Signed)
Message copied by Burnice Logan on Mon Dec 18, 2012  8:26 AM ------      Message from: Rande Brunt      Created: Fri Dec 15, 2012 11:56 AM       EF 65-70%. Review at f/u OV

## 2012-12-18 NOTE — Telephone Encounter (Signed)
Pt informed of results.

## 2012-12-21 ENCOUNTER — Ambulatory Visit (INDEPENDENT_AMBULATORY_CARE_PROVIDER_SITE_OTHER): Payer: BC Managed Care – PPO | Admitting: Physician Assistant

## 2012-12-21 ENCOUNTER — Encounter: Payer: Self-pay | Admitting: Physician Assistant

## 2012-12-21 VITALS — BP 124/72 | HR 74 | Ht 69.0 in | Wt 244.0 lb

## 2012-12-21 DIAGNOSIS — E119 Type 2 diabetes mellitus without complications: Secondary | ICD-10-CM

## 2012-12-21 DIAGNOSIS — R079 Chest pain, unspecified: Secondary | ICD-10-CM | POA: Insufficient documentation

## 2012-12-21 NOTE — Progress Notes (Signed)
Primary Cardiologist: Mady Gemma, MD (new)    HPI: Post hospital followup from [facility], following recent evaluation for atypical chest pain. Cardiac markers normal. He was referred for a GXT Cardiolite, which was NL; EF 62%. We also arranged for an outpatient echocardiogram, for further evaluation of a systolic murmur. This yielded no significant valvular disease, again with normal LVF (65-70%), no focal WMAs, and grade 1 diastolic dysfunction.  Patient presented with prior history of a normal cardiac catheterization 2005, preceded by a false positive GXT Cardiolite.  Patient has not had any recurrent CP, since his recent hospitalization.  Allergies  Allergen Reactions  . Hydromorphone Hcl Other (See Comments)    When pt had cancer surgery he was given Dilaudid and pt has sleep apnea and didn't have his CiPaP machine and therefore it sedated him too much.    Current Outpatient Prescriptions  Medication Sig Dispense Refill  . ALPRAZolam (XANAX) 0.5 MG tablet Take 0.5 mg by mouth daily as needed. For anxiety or sleep      . amitriptyline (ELAVIL) 50 MG tablet Take 150 mg by mouth at bedtime.       . cloNIDine (CATAPRES) 0.2 MG tablet Take 0.2 mg by mouth daily.      . cyanocobalamin (,VITAMIN B-12,) 1000 MCG/ML injection Inject 1 mL (1,000 mcg total) into the skin every 30 (thirty) days.  12 mL  0  . metFORMIN (GLUCOPHAGE) 500 MG tablet Take 500 mg by mouth daily with breakfast.       . Multiple Vitamin (MULITIVITAMIN WITH MINERALS) TABS Take 1 tablet by mouth daily.      . timolol (TIMOPTIC) 0.5 % ophthalmic solution Place 1 drop into both eyes daily.         Past Medical History  Diagnosis Date  . Colon cancer     stage II, diagnosed 2007  . BPH (benign prostatic hyperplasia)   . Staph infection 2008    left side of face  . Depression   . Glaucoma(365) 2012  . Sleep apnea     wears CiPaP at night  . Diabetes mellitus   . Complication of anesthesia     vagal response after  umbilcal hernia at [facility]    Past Surgical History  Procedure Date  . Kidney stone surgery   . Colectomy 2007    right hemicolectomy  . Skin lesion excision     left shoulder/pre cancerous  . Pilonidal cyst / sinus excision 1976  . Appendectomy   . Cardiac catheterization   . Hernia repair     umbilical  . Hemorrhoid surgery 12/31/2011    Procedure: HEMORRHOIDECTOMY;  Surgeon: [name];  Location: [facility];  Service: General;  Laterality: N/A;  . Colonoscopy 05/11/10    normal rectum,normal residual colon with some angry appearring anastomtic mucosa with some erosions and oozing, bx unremarkable  . Colonoscopy 05/16/2012    Procedure: COLONOSCOPY;  Surgeon: Corbin Ade, MD;  Location: [facility];  Service: Endoscopy;  Laterality: N/A;  7:30    History   Social History  . Marital Status: Married    Spouse Name: N/A    Number of Children: 1  . Years of Education: N/A   Occupational History  . school system    Social History Main Topics  . Smoking status: Never Smoker   . Smokeless tobacco: Never Used  . Alcohol Use: No  . Drug Use: No  . Sexually Active: Not on file   Other Topics Concern  .  Not on file   Social History Narrative  . No narrative on file    Family History  Problem Relation Age of Onset  . Anesthesia problems Neg Hx   . Hypotension Neg Hx   . Malignant hyperthermia Neg Hx   . Pseudochol deficiency Neg Hx   . Lung cancer Father   . Diabetes Mother     ROS: no nausea, vomiting; no fever, chills; no melena, hematochezia; no claudication  PHYSICAL EXAM: BP 124/72  Pulse 74  Ht 5\' 9"  (1.753 m)  Wt 244 lb (110.678 kg)  BMI 36.03 kg/m2 GENERAL: 62 year-old male, obese; NAD HEENT: NCAT, PERRLA, EOMI; sclera clear; no xanthelasma NECK: palpable bilateral carotid pulses, no bruits; no JVD; no TM LUNGS: CTA bilaterally CARDIAC: RRR (S1, S2); no significant murmurs; no rubs or gallops ABDOMEN: Protuberant EXTREMETIES: no significant  peripheral edema SKIN: warm/dry; no obvious rash/lesions MUSCULOSKELETAL: no joint deformity NEURO: no focal deficit; NL affect   EKG: reviewed and available in Electronic Records   ASSESSMENT & PLAN:  Chest pain No further workup indicated, status post recent presentation with atypical CP and normal cardiac markers. In house evaluation consisted of an adequate GXT Cardiolite, which yielded normal perfusion. A followup outpatient echocardiogram, for assessment of systolic murmur, indicated normal LVF and no significant valvular disease. Patient has not had any recurrent symptoms of CP, and we will have him return to this clinic on an as-needed basis.  Type 2 diabetes mellitus Followed by primary M.D. Discussed recommendation to add a statin to his medication regimen, given that DM is recognized as CAD equivalent. Of note, he did have a recent LDL of 65. Patient to discuss this with Dr. Dimas Aguas, in followup.    Gene Daviana Haymaker, PAC

## 2012-12-21 NOTE — Assessment & Plan Note (Signed)
No further workup indicated, status post recent presentation with atypical CP and normal cardiac markers. In house evaluation consisted of an adequate GXT Cardiolite, which yielded normal perfusion. A followup outpatient echocardiogram, for assessment of systolic murmur, indicated normal LVF and no significant valvular disease. Patient has not had any recurrent symptoms of CP, and we will have him return to this clinic on an as-needed basis.

## 2012-12-21 NOTE — Patient Instructions (Addendum)
Continue all current medications. Follow up as needed  

## 2012-12-21 NOTE — Assessment & Plan Note (Signed)
Followed by primary M.D. Discussed recommendation to add a statin to his medication regimen, given that DM is recognized as CAD equivalent. Of note, he did have a recent LDL of 65. Patient to discuss this with Dr. Dimas Aguas, in followup.

## 2013-02-03 ENCOUNTER — Other Ambulatory Visit: Payer: Self-pay

## 2013-03-19 ENCOUNTER — Telehealth (HOSPITAL_COMMUNITY): Payer: Self-pay

## 2013-03-19 ENCOUNTER — Other Ambulatory Visit (HOSPITAL_COMMUNITY): Payer: Self-pay | Admitting: Oncology

## 2013-03-19 DIAGNOSIS — E538 Deficiency of other specified B group vitamins: Secondary | ICD-10-CM

## 2013-03-19 MED ORDER — CYANOCOBALAMIN 1000 MCG/ML IJ SOLN: 1000.0000 ug | INTRAMUSCULAR | Status: DC

## 2013-03-19 NOTE — Telephone Encounter (Signed)
Call from patient asking if 9 months supply of B12 injections be called/sent to [facility] in [place].  Is having difficulty getting B12 from current pharmacy.

## 2013-03-19 NOTE — Telephone Encounter (Signed)
Escribed to [facility].  9 month supply

## 2013-03-19 NOTE — Telephone Encounter (Signed)
Message left on patient's voicemail that B12 was escribed to [facility] pharmacy.

## 2013-05-15 ENCOUNTER — Other Ambulatory Visit (HOSPITAL_COMMUNITY): Payer: BC Managed Care – PPO

## 2013-08-29 ENCOUNTER — Other Ambulatory Visit (HOSPITAL_COMMUNITY): Payer: Self-pay | Admitting: Oncology

## 2013-08-29 DIAGNOSIS — E538 Deficiency of other specified B group vitamins: Secondary | ICD-10-CM

## 2013-08-29 MED ORDER — CYANOCOBALAMIN 1000 MCG/ML IJ SOLN: 1000.0000 ug | INTRAMUSCULAR | Status: DC

## 2013-10-25 ENCOUNTER — Other Ambulatory Visit: Payer: Self-pay

## 2013-11-05 ENCOUNTER — Other Ambulatory Visit (HOSPITAL_COMMUNITY): Payer: BC Managed Care – PPO

## 2013-11-05 ENCOUNTER — Encounter (HOSPITAL_COMMUNITY): Payer: BC Managed Care – PPO | Attending: Hematology and Oncology

## 2013-11-05 DIAGNOSIS — C182 Malignant neoplasm of ascending colon: Secondary | ICD-10-CM

## 2013-11-05 DIAGNOSIS — Z85038 Personal history of other malignant neoplasm of large intestine: Secondary | ICD-10-CM | POA: Insufficient documentation

## 2013-11-05 DIAGNOSIS — Z09 Encounter for follow-up examination after completed treatment for conditions other than malignant neoplasm: Secondary | ICD-10-CM | POA: Insufficient documentation

## 2013-11-05 DIAGNOSIS — C189 Malignant neoplasm of colon, unspecified: Secondary | ICD-10-CM

## 2013-11-05 LAB — COMPREHENSIVE METABOLIC PANEL
AST: 24 U/L (ref 0–37)
Albumin: 3.7 g/dL (ref 3.5–5.2)
BUN: 17 mg/dL (ref 6–23)
Creatinine, Ser: 0.92 mg/dL (ref 0.50–1.35)
Total Protein: 7 g/dL (ref 6.0–8.3)

## 2013-11-05 LAB — CBC WITH DIFFERENTIAL/PLATELET
Basophils Absolute: 0 10*3/uL (ref 0.0–0.1)
Basophils Relative: 0 % (ref 0–1)
Eosinophils Absolute: 0.2 10*3/uL (ref 0.0–0.7)
HCT: 40.9 % (ref 39.0–52.0)
Hemoglobin: 14.6 g/dL (ref 13.0–17.0)
MCH: 30.6 pg (ref 26.0–34.0)
MCHC: 35.7 g/dL (ref 30.0–36.0)
Monocytes Absolute: 0.6 10*3/uL (ref 0.1–1.0)
Monocytes Relative: 12 % (ref 3–12)
Neutrophils Relative %: 51 % (ref 43–77)
RDW: 13.4 % (ref 11.5–15.5)

## 2013-11-05 NOTE — Progress Notes (Signed)
Labs drawn today for cbc/diff,cmp,cea,ferr 

## 2013-11-06 LAB — CEA: CEA: 1.2 ng/mL (ref 0.0–5.0)

## 2013-11-14 ENCOUNTER — Encounter (HOSPITAL_BASED_OUTPATIENT_CLINIC_OR_DEPARTMENT_OTHER): Payer: BC Managed Care – PPO

## 2013-11-14 ENCOUNTER — Encounter (HOSPITAL_COMMUNITY): Payer: Self-pay

## 2013-11-14 VITALS — BP 138/81 | HR 68 | Temp 97.5°F | Resp 16 | Wt 223.2 lb

## 2013-11-14 DIAGNOSIS — C189 Malignant neoplasm of colon, unspecified: Secondary | ICD-10-CM

## 2013-11-14 DIAGNOSIS — Z85038 Personal history of other malignant neoplasm of large intestine: Secondary | ICD-10-CM

## 2013-11-14 DIAGNOSIS — E538 Deficiency of other specified B group vitamins: Secondary | ICD-10-CM

## 2013-11-14 MED ORDER — CYANOCOBALAMIN 1000 MCG/ML IJ SOLN: 1000.0000 ug | INTRAMUSCULAR | Status: AC

## 2013-11-14 MED ORDER — CYANOCOBALAMIN 1000 MCG/ML IJ SOLN: 1000.0000 ug | Freq: Once | INTRAMUSCULAR | Status: DC

## 2013-11-14 NOTE — Patient Instructions (Signed)
[facility] Discharge Instructions  RECOMMENDATIONS MADE BY THE CONSULTANT AND ANY TEST RESULTS WILL BE SENT TO YOUR REFERRING PHYSICIAN.  EXAM FINDINGS BY THE PHYSICIAN TODAY AND SIGNS OR SYMPTOMS TO REPORT TO CLINIC OR PRIMARY PHYSICIAN: Exam and findings as discussed by Dr. Zigmund Daniel.  You are doing well, your blood work is stable.  Report changes in bowel habits, blood in your bowel movement or other problems.  MEDICATIONS PRESCRIBED:  Refill for B12 injections  INSTRUCTIONS/FOLLOW-UP: Labs and MD in 1 year.  Thank you for choosing [facility] to provide your oncology and hematology care.  To afford each patient quality time with our providers, please arrive at least 15 minutes before your scheduled appointment time.  With your help, our goal is to use those 15 minutes to complete the necessary work-up to ensure our physicians have the information they need to help with your evaluation and healthcare recommendations.    Effective January 1st, 2014, we ask that you re-schedule your appointment with our physicians should you arrive 10 or more minutes late for your appointment.  We strive to give you quality time with our providers, and arriving late affects you and other patients whose appointments are after yours.    Again, thank you for choosing [facility].  Our hope is that these requests will decrease the amount of time that you wait before being seen by our physicians.       _____________________________________________________________  Should you have questions after your visit to [facility], please contact our office at [phone] between the hours of 8:30 a.m. and 5:00 p.m.  Voicemails left after 4:30 p.m. will not be returned until the following business day.  For prescription refill requests, have your pharmacy contact our office with your prescription refill request.

## 2013-11-14 NOTE — Progress Notes (Signed)
[facility]  OFFICE PROGRESS NOTE  [name], MD [address] [place] [address]  DIAGNOSIS: Colon cancer - Plan: Ferritin, CBC with Differential, Comprehensive metabolic panel, CEA, Ferritin, CANCELED: CBC with Differential, CANCELED: Comprehensive metabolic panel, CANCELED: CEA  Chief Complaint  Patient presents with  . Colon Cancer    04/2006 diagnosis    CURRENT THERAPY: watchful expectation.  INTERVAL HISTORY: [name] 62 y.o. male returns for followup of stage II adenocarcinoma of the colon, status post right hemicolectomy in May of 2007, no postop treatment for T3 N0 disease, stage II. Microsatellite stability is unknown. He continues to do well with regular bowel movements and no episodes of melena or hematochezia. Last colonoscopy was 2 years ago with polyps discovered. Followup has been arranged with his gastroenterologist. He denies any fever, night sweats, abdominal pain, nausea, vomiting, diarrhea, constipation, lower 70 swelling or redness, joint pain, cough, wheezing, nasal drip, sore throat, skin rash, headache, or seizures.   MEDICAL HISTORY: Past Medical History  Diagnosis Date  . Colon cancer     stage II, diagnosed 2007  . BPH (benign prostatic hyperplasia)   . Staph infection 2008    left side of face  . Depression   . Glaucoma(365) 2012  . Sleep apnea     wears CiPaP at night  . Diabetes mellitus   . Complication of anesthesia     vagal response after umbilcal hernia at [facility]    INTERIM HISTORY: has COLON CANCER; VITAMIN B12 DEFICIENCY; OBESITY; GASTROESOPHAGEAL REFLUX DISEASE, HX OF; Musculoskeletal pain; Strain of hip adductor muscle; Hip pain; Change in stool; Rectal bleeding; Chest pain; and Type 2 diabetes mellitus on his problem list.   Stage II (T3, N0) adenocarcinoma of the colon s/p R hemicolectomy the past;, a 8.5 cm in size with negative nodes, negative margins. 19 negative nodes and he had  surgery on 04/19/2006. He was offered a protocol but declined. He has not had any evidence recurrence  ALLERGIES:  is allergic to hydromorphone hcl.  MEDICATIONS: has a current medication list which includes the following prescription(s): alprazolam, amitriptyline, clonidine, cyanocobalamin, metformin, multivitamin with minerals, and timolol.  SURGICAL HISTORY:  Past Surgical History  Procedure Laterality Date  . Kidney stone surgery    . Colectomy  2007    right hemicolectomy  . Skin lesion excision      left shoulder/pre cancerous  . Pilonidal cyst / sinus excision  1976  . Appendectomy    . Cardiac catheterization    . Hernia repair      umbilical  . Hemorrhoid surgery  12/31/2011    Procedure: HEMORRHOIDECTOMY;  Surgeon: [name];  Location: [facility];  Service: General;  Laterality: N/A;  . Colonoscopy  05/11/10    normal rectum,normal residual colon with some angry appearring anastomtic mucosa with some erosions and oozing, bx unremarkable  . Colonoscopy  05/16/2012    Procedure: COLONOSCOPY;  Surgeon: Corbin Ade, MD;  Location: [facility];  Service: Endoscopy;  Laterality: N/A;  7:30    FAMILY HISTORY: family history includes Diabetes in his mother; Lung cancer in his father. There is no history of Anesthesia problems, Hypotension, Malignant hyperthermia, or Pseudochol deficiency.  SOCIAL HISTORY:  reports that he has never smoked. He has never used smokeless tobacco. He reports that he does not drink alcohol or use illicit drugs.  REVIEW OF SYSTEMS:  Other than that discussed above is  noncontributory.  PHYSICAL EXAMINATION: ECOG PERFORMANCE STATUS: 0 - Asymptomatic  There were no vitals taken for this visit.  GENERAL:alert, no distress and comfortable SKIN: skin color, texture, turgor are normal, no rashes or significant lesions EYES: PERLA; Conjunctiva are pink and non-injected, sclera clear OROPHARYNX:no exudate, no erythema on lips, buccal mucosa, or  tongue. NECK: supple, thyroid normal size, non-tender, without nodularity. No masses CHEST: normal AP diameter with no breast masses. LYMPH:  no palpable lymphadenopathy in the cervical, axillary or inguinal LUNGS: clear to auscultation and percussion with normal breathing effort HEART: regular rate & rhythm and no murmurs. ABDOMEN:abdomen soft, non-tender and normal bowel sounds MUSCULOSKELETAL:no cyanosis of digits and no clubbing. Range of motion normal.  NEURO: alert & oriented x 3 with fluent speech, no focal motor/sensory deficits   LABORATORY DATA: Infusion on 11/05/2013  Component Date Value Range Status  . WBC 11/05/2013 5.5  4.0 - 10.5 K/uL Final  . RBC 11/05/2013 4.77  4.22 - 5.81 MIL/uL Final  . Hemoglobin 11/05/2013 14.6  13.0 - 17.0 g/dL Final  . HCT 16/09/9603 40.9  39.0 - 52.0 % Final  . MCV 11/05/2013 85.7  78.0 - 100.0 fL Final  . MCH 11/05/2013 30.6  26.0 - 34.0 pg Final  . MCHC 11/05/2013 35.7  30.0 - 36.0 g/dL Final  . RDW 54/08/8118 13.4  11.5 - 15.5 % Final  . Platelets 11/05/2013 154  150 - 400 K/uL Final  . Neutrophils Relative % 11/05/2013 51  43 - 77 % Final  . Neutro Abs 11/05/2013 2.8  1.7 - 7.7 K/uL Final  . Lymphocytes Relative 11/05/2013 34  12 - 46 % Final  . Lymphs Abs 11/05/2013 1.9  0.7 - 4.0 K/uL Final  . Monocytes Relative 11/05/2013 12  3 - 12 % Final  . Monocytes Absolute 11/05/2013 0.6  0.1 - 1.0 K/uL Final  . Eosinophils Relative 11/05/2013 4  0 - 5 % Final  . Eosinophils Absolute 11/05/2013 0.2  0.0 - 0.7 K/uL Final  . Basophils Relative 11/05/2013 0  0 - 1 % Final  . Basophils Absolute 11/05/2013 0.0  0.0 - 0.1 K/uL Final  . Sodium 11/05/2013 138  135 - 145 mEq/L Final  . Potassium 11/05/2013 4.3  3.5 - 5.1 mEq/L Final  . Chloride 11/05/2013 102  96 - 112 mEq/L Final  . CO2 11/05/2013 29  19 - 32 mEq/L Final  . Glucose, Bld 11/05/2013 102* 70 - 99 mg/dL Final  . BUN 14/78/2956 17  6 - 23 mg/dL Final  . Creatinine, Ser 11/05/2013 0.92   0.50 - 1.35 mg/dL Final  . Calcium 21/30/8657 9.6  8.4 - 10.5 mg/dL Final  . Total Protein 11/05/2013 7.0  6.0 - 8.3 g/dL Final  . Albumin 84/69/6295 3.7  3.5 - 5.2 g/dL Final  . AST 28/41/3244 24  0 - 37 U/L Final  . ALT 11/05/2013 26  0 - 53 U/L Final  . Alkaline Phosphatase 11/05/2013 101  39 - 117 U/L Final  . Total Bilirubin 11/05/2013 0.3  0.3 - 1.2 mg/dL Final  . GFR calc non Af Amer 11/05/2013 89* >90 mL/min Final  . GFR calc Af Amer 11/05/2013 >90  >90 mL/min Final   Comment: (NOTE)                          The eGFR has been calculated using the CKD EPI equation.  This calculation has not been validated in all clinical situations.                          eGFR's persistently <90 mL/min signify possible Chronic Kidney                          Disease.  . CEA 11/05/2013 1.2  0.0 - 5.0 ng/mL Final   Performed at [facility]  . Ferritin 11/05/2013 94  22 - 322 ng/mL Final   Performed at [facility]    PATHOLOGY:no new pathology.  Urinalysis No results found for this basename: colorurine, appearanceur, labspec, phurine, glucoseu, hgbur, bilirubinur, ketonesur, proteinur, urobilinogen, nitrite, leukocytesur    RADIOGRAPHIC STUDIES: No results found.  ASSESSMENT:  #1. Stage II carcinoma the descending colon, status post right hemicolectomy in May of 2007, no postop therapy, no evidence of disease. #2. Diabetes mellitus, resolved after weight loss on no medication at this time. #3. Glaucoma, on treatment. #4. Pernicious anemia, receiving B12 monthly   PLAN:  #1. Watchful expectation to be continued with yearly followup and lab tests. #2. Vitamin B12 1000 mcg intramuscularly monthly. #3. Patient was told to call should any new symptoms occur that are troublesome and persistent. #4. Followup in one year with lab tests.   All questions were answered. The patient knows to call the clinic with any problems, questions or concerns. We  can certainly see the patient much sooner if necessary.   I spent 25 minutes counseling the patient face to face. The total time spent in the appointment was 30 minutes.    Maurilio Lovely, MD 11/14/2013 8:52 AM

## 2014-08-20 ENCOUNTER — Ambulatory Visit (INDEPENDENT_AMBULATORY_CARE_PROVIDER_SITE_OTHER): Payer: BC Managed Care – PPO | Admitting: Internal Medicine

## 2014-08-20 ENCOUNTER — Encounter: Payer: Self-pay | Admitting: Internal Medicine

## 2014-08-20 VITALS — BP 139/78 | HR 69 | Temp 98.0°F | Ht 68.5 in | Wt 228.8 lb

## 2014-08-20 DIAGNOSIS — R194 Change in bowel habit: Secondary | ICD-10-CM

## 2014-08-20 DIAGNOSIS — R198 Other specified symptoms and signs involving the digestive system and abdomen: Secondary | ICD-10-CM

## 2014-08-20 DIAGNOSIS — Z85038 Personal history of other malignant neoplasm of large intestine: Secondary | ICD-10-CM

## 2014-08-20 NOTE — Patient Instructions (Signed)
Take Align or other probiotic daily  Take an Imodium tablet before supper as needed  Call if bowel function doesn't improve  Office visit in 1 year

## 2014-08-20 NOTE — Progress Notes (Signed)
Primary Care Physician:  Rory Percy, MD Primary Gastroenterologist:  Dr. Gala Romney  Pre-Procedure History & Physical: HPI:  [name] is a 63 y.o. male here for valuation change in bowel habits. The patient still from one formed bowel movement a day to one to 2 semi-formed stools typically in the evening after supper. No more than 3 stools daily. No bleeding. No associated abdominal pain. Has been on a variety of diets recently trying to lose weight. No antibiotic exposure. His antidepressant regimen has been altered recently as well. History of stage II colon cancer with resection in 2007. Last colonoscopy 2 years ago demonstrated a small anastomotic ulcer felt to be ischemic in nature. No evidence of recurrent colon cancer.  Some stress with a new job recently.  Has not been on Glucophage in some time.  No symptoms consistent with lactose intolerance.  Past Medical History  Diagnosis Date  . Colon cancer     stage II, diagnosed 2007  . BPH (benign prostatic hyperplasia)   . Staph infection 2008    left side of face  . Depression   . Glaucoma 2012  . Sleep apnea     wears CiPaP at night  . Diabetes mellitus   . Complication of anesthesia     vagal response after umbilcal hernia at [facility]    Past Surgical History  Procedure Laterality Date  . Kidney stone surgery    . Colectomy  2007    right hemicolectomy  . Skin lesion excision      left shoulder/pre cancerous  . Pilonidal cyst / sinus excision  1976  . Appendectomy    . Cardiac catheterization    . Hernia repair      umbilical  . Hemorrhoid surgery  12/31/2011    Procedure: HEMORRHOIDECTOMY;  Surgeon: Jamesetta So;  Location: [facility];  Service: General;  Laterality: N/A;  . Colonoscopy  05/11/10    normal rectum,normal residual colon with some angry appearring anastomtic mucosa with some erosions and oozing, bx unremarkable  . Colonoscopy  05/16/2012    Dr. Gala Romney- normal rectum, sigmoid diverticulosis,  anastomotic ulcers. bx= acute ulcer and benign colonic mucosa with intramucosal lympoid aggregates    Prior to Admission medications   Medication Sig Start Date End Date Taking? Authorizing Provider  ALPRAZolam Duanne Moron) 0.5 MG tablet Take 0.5 mg by mouth daily as needed. For anxiety or sleep 10/24/12  Yes Historical Provider, MD  amitriptyline (ELAVIL) 50 MG tablet Take 50 mg by mouth at bedtime.  11/08/11  Yes Historical Provider, MD  cyanocobalamin (,VITAMIN B-12,) 1000 MCG/ML injection Inject 1 mL (1,000 mcg total) into the skin every 30 (thirty) days. 11/14/13 11/14/14 Yes Farrel Gobble, MD  lamoTRIgine (LAMICTAL) 200 MG tablet Take 200 mg by mouth daily.  07/24/14  Yes Historical Provider, MD  Multiple Vitamin (MULITIVITAMIN WITH MINERALS) TABS Take 1 tablet by mouth daily.   Yes Historical Provider, MD  tamsulosin (FLOMAX) 0.4 MG CAPS capsule Take 0.4 mg by mouth at bedtime.   Yes Historical Provider, MD  timolol (TIMOPTIC) 0.5 % ophthalmic solution Place 1 drop into both eyes daily.  08/26/11  Yes Historical Provider, MD    Allergies as of 08/20/2014 - Review Complete 08/20/2014  Allergen Reaction Noted  . Hydromorphone hcl Other (See Comments) 08/14/2009    Family History  Problem Relation Age of Onset  . Anesthesia problems Neg Hx   . Hypotension Neg Hx   . Malignant hyperthermia Neg Hx   .  Pseudochol deficiency Neg Hx   . Lung cancer Father   . Diabetes Mother     History   Social History  . Marital Status: Married    Spouse Name: N/A    Number of Children: 1  . Years of Education: N/A   Occupational History  . school system    Social History Main Topics  . Smoking status: Never Smoker   . Smokeless tobacco: Never Used  . Alcohol Use: No  . Drug Use: No  . Sexual Activity: Not on file   Other Topics Concern  . Not on file   Social History Narrative  . No narrative on file    Review of Systems: See HPI, otherwise negative ROS  Physical Exam: BP 139/78   Pulse 69  Temp(Src) 98 F (36.7 C) (Oral)  Ht 5' 8.5" (1.74 m)  Wt 228 lb 12.8 oz (103.783 kg)  BMI 34.28 kg/m2 General:   Alert,  Well-developed, well-nourished, pleasant and cooperative in NAD Skin:  Intact without significant lesions or rashes. Eyes:  Sclera clear, no icterus.   Conjunctiva pink. Ears:  Normal auditory acuity. Nose:  No deformity, discharge,  or lesions. Mouth:  No deformity or lesions. Neck:  Supple; no masses or thyromegaly. No significant cervical adenopathy. Lungs:  Clear throughout to auscultation.   No wheezes, crackles, or rhonchi. No acute distress. Heart:  Regular rate and rhythm; no murmurs, clicks, rubs,  or gallops. Abdomen: Obese. Non-distended, normal bowel sounds.  Soft and nontender without appreciable mass or hepatosplenomegaly.  Pulses:  Normal pulses noted. Extremities:  Without clubbing or edema.  Impression:  63 year old gentleman with history of stage II colon cancer  -  resected 2007 without recurrence; notes some change in consistency of bowel movements over the past several months. Has no more than 1-2 semi-formed stools in the afternoon daily. Has no more than 2 BM's daily; Symptoms are nonspecific.   I really do not think he has true diarrhea.  Recommendations:Take Align or other probiotic daily  Take an Imodium tablet before supper as needed  Call if bowel function doesn't improve  Office visit in 1 year; Plan for surveillance colonoscopy no syndrome in 2018     Notice: This dictation was prepared with Dragon dictation along with smaller phrase technology. Any transcriptional errors that result from this process are unintentional and may not be corrected upon review.

## 2014-11-12 ENCOUNTER — Encounter (HOSPITAL_COMMUNITY): Payer: BC Managed Care – PPO | Attending: Hematology and Oncology

## 2014-11-12 DIAGNOSIS — C189 Malignant neoplasm of colon, unspecified: Secondary | ICD-10-CM | POA: Insufficient documentation

## 2014-11-12 DIAGNOSIS — Z85038 Personal history of other malignant neoplasm of large intestine: Secondary | ICD-10-CM

## 2014-11-12 LAB — COMPREHENSIVE METABOLIC PANEL
ALBUMIN: 3.9 g/dL (ref 3.5–5.2)
ALK PHOS: 113 U/L (ref 39–117)
ALT: 60 U/L — AB (ref 0–53)
AST: 41 U/L — AB (ref 0–37)
Anion gap: 12 (ref 5–15)
BILIRUBIN TOTAL: 0.5 mg/dL (ref 0.3–1.2)
BUN: 20 mg/dL (ref 6–23)
CO2: 27 mEq/L (ref 19–32)
Calcium: 9.2 mg/dL (ref 8.4–10.5)
Chloride: 99 mEq/L (ref 96–112)
Creatinine, Ser: 0.97 mg/dL (ref 0.50–1.35)
GFR calc Af Amer: >90 mL/min (ref >90)
GFR calc non Af Amer: 87 mL/min — ABNORMAL LOW (ref >90)
Glucose, Bld: 97 mg/dL (ref 70–99)
POTASSIUM: 4.2 meq/L (ref 3.7–5.3)
SODIUM: 138 meq/L (ref 137–147)
TOTAL PROTEIN: 7.2 g/dL (ref 6.0–8.3)

## 2014-11-12 LAB — CBC WITH DIFFERENTIAL/PLATELET
BASOS PCT: 0 % (ref 0–1)
Basophils Absolute: 0 10*3/uL (ref 0.0–0.1)
EOS ABS: 0.1 10*3/uL (ref 0.0–0.7)
Eosinophils Relative: 3 % (ref 0–5)
HCT: 42.3 % (ref 39.0–52.0)
HEMOGLOBIN: 15 g/dL (ref 13.0–17.0)
Lymphocytes Relative: 32 % (ref 12–46)
Lymphs Abs: 1.7 10*3/uL (ref 0.7–4.0)
MCH: 30.5 pg (ref 26.0–34.0)
MCHC: 35.5 g/dL (ref 30.0–36.0)
MCV: 86.2 fL (ref 78.0–100.0)
MONOS PCT: 9 % (ref 3–12)
Monocytes Absolute: 0.5 10*3/uL (ref 0.1–1.0)
NEUTROS ABS: 2.9 10*3/uL (ref 1.7–7.7)
NEUTROS PCT: 56 % (ref 43–77)
Platelets: ADEQUATE 10*3/uL (ref 150–400)
RBC: 4.91 MIL/uL (ref 4.22–5.81)
RDW: 13.5 % (ref 11.5–15.5)
WBC: 5.2 10*3/uL (ref 4.0–10.5)

## 2014-11-12 LAB — FERRITIN: Ferritin: 143 ng/mL (ref 22–322)

## 2014-11-12 NOTE — Progress Notes (Signed)
LABS FOR CEA,FERR,CBCD,CMP

## 2014-11-13 ENCOUNTER — Encounter (HOSPITAL_BASED_OUTPATIENT_CLINIC_OR_DEPARTMENT_OTHER): Payer: BC Managed Care – PPO

## 2014-11-13 ENCOUNTER — Encounter (HOSPITAL_COMMUNITY): Payer: Self-pay

## 2014-11-13 VITALS — BP 142/70 | HR 67 | Temp 97.8°F | Resp 18 | Wt 241.0 lb

## 2014-11-13 DIAGNOSIS — C189 Malignant neoplasm of colon, unspecified: Secondary | ICD-10-CM

## 2014-11-13 DIAGNOSIS — D510 Vitamin B12 deficiency anemia due to intrinsic factor deficiency: Secondary | ICD-10-CM

## 2014-11-13 DIAGNOSIS — Z85038 Personal history of other malignant neoplasm of large intestine: Secondary | ICD-10-CM

## 2014-11-13 LAB — CEA: CEA: 0.9 ng/mL (ref 0.0–5.0)

## 2014-11-13 NOTE — Patient Instructions (Signed)
[facility] Discharge Instructions  RECOMMENDATIONS MADE BY THE CONSULTANT AND ANY TEST RESULTS WILL BE SENT TO YOUR REFERRING PHYSICIAN.  EXAM FINDINGS BY THE PHYSICIAN TODAY AND SIGNS OR SYMPTOMS TO REPORT TO CLINIC OR PRIMARY PHYSICIAN: Exam and findings as discussed by Dr. Barnet Glasgow.  Your labs are stable.  Report unexplained weight loss, changes in bowel habits or other concerns.    INSTRUCTIONS/FOLLOW-UP: Follow-up in 1 year with labs and office visit.  Thank you for choosing [facility] to provide your oncology and hematology care.  To afford each patient quality time with our providers, please arrive at least 15 minutes before your scheduled appointment time.  With your help, our goal is to use those 15 minutes to complete the necessary work-up to ensure our physicians have the information they need to help with your evaluation and healthcare recommendations.    Effective January 1st, 2014, we ask that you re-schedule your appointment with our physicians should you arrive 10 or more minutes late for your appointment.  We strive to give you quality time with our providers, and arriving late affects you and other patients whose appointments are after yours.    Again, thank you for choosing [facility].  Our hope is that these requests will decrease the amount of time that you wait before being seen by our physicians.       _____________________________________________________________  Should you have questions after your visit to [facility], please contact our office at [phone] between the hours of 8:30 a.m. and 4:30 p.m.  Voicemails left after 4:30 p.m. will not be returned until the following business day.  For prescription refill requests, have your pharmacy contact our office with your prescription refill request.    _______________________________________________________________  We hope that we have given you very  good care.  You may receive a patient satisfaction survey in the mail, please complete it and return it as soon as possible.  We value your feedback!  _______________________________________________________________  Have you asked about our STAR program?  STAR stands for Survivorship Training and Rehabilitation, and this is a nationally recognized cancer care program that focuses on survivorship and rehabilitation.  Cancer and cancer treatments may cause problems, such as, pain, making you feel tired and keeping you from doing the things that you need or want to do. Cancer rehabilitation can help. Our goal is to reduce these troubling effects and help you have the best quality of life possible.  You may receive a survey from a nurse that asks questions about your current state of health.  Based on the survey results, all eligible patients will be referred to the [facility] program for an evaluation so we can better serve you!  A frequently asked questions sheet is available upon request.

## 2014-11-13 NOTE — Progress Notes (Signed)
[place]  OFFICE PROGRESS NOTE  Rory Percy, MD [facility] [id number]  DIAGNOSIS: Colon cancer - Plan: CBC with Differential, Comprehensive metabolic panel, CEA, Ferritin  Chief Complaint  Patient presents with  . Colon Cancer  . Follow-up    CURRENT THERAPY: Watchful expectation. Monthly vitamin B-12 for pernicious anemia.  INTERVAL HISTORY: LAJARVIS [name] 63 y.o. male returns for followup of stage II adenocarcinoma of the colon, status post right hemicolectomy in May of 2007, no postop treatment for T3 N0 disease, stage II. Microsatellite stability is unknown. He continues to do well with regular bowel movements and no episodes of hematochezia or melena. No abdominal pain, fever, night sweats, back pain, bone pain, joint discomfort, lower extremity swelling or redness, PND, orthopnea, palpitations, peripheral paresthesias, skin rash, headache, or seizures.  MEDICAL HISTORY: Past Medical History  Diagnosis Date  . Colon cancer     stage II, diagnosed 2007  . BPH (benign prostatic hyperplasia)   . Staph infection 2008    left side of face  . Depression   . Glaucoma 2012  . Sleep apnea     wears CiPaP at night  . Diabetes mellitus   . Complication of anesthesia     vagal response after umbilcal hernia at [facility]    INTERIM HISTORY: has COLON CANCER; VITAMIN B12 DEFICIENCY; OBESITY; GASTROESOPHAGEAL REFLUX DISEASE, HX OF; Musculoskeletal pain; Strain of hip adductor muscle; Hip pain; Change in stool; Rectal bleeding; Chest pain; and Type 2 diabetes mellitus on his problem list.    ALLERGIES:  is allergic to hydromorphone hcl.  MEDICATIONS: has a current medication list which includes the following prescription(s): alprazolam, amitriptyline, cyanocobalamin, lamotrigine, multivitamin with minerals, tamsulosin, and timolol.  SURGICAL HISTORY:  Past Surgical History  Procedure Laterality Date  . Kidney stone surgery     . Colectomy  2007    right hemicolectomy  . Skin lesion excision      left shoulder/pre cancerous  . Pilonidal cyst / sinus excision  1976  . Appendectomy    . Cardiac catheterization    . Hernia repair      umbilical  . Hemorrhoid surgery  12/31/2011    Procedure: HEMORRHOIDECTOMY;  Surgeon: Jamesetta So;  Location: [facility];  Service: General;  Laterality: N/A;  . Colonoscopy  05/11/10    normal rectum,normal residual colon with some angry appearring anastomtic mucosa with some erosions and oozing, bx unremarkable  . Colonoscopy  05/16/2012    Dr. Gala Romney- normal rectum, sigmoid diverticulosis, anastomotic ulcers. bx= acute ulcer and benign colonic mucosa with intramucosal lympoid aggregates    FAMILY HISTORY: family history includes Diabetes in his mother; Lung cancer in his father. There is no history of Anesthesia problems, Hypotension, Malignant hyperthermia, or Pseudochol deficiency.  SOCIAL HISTORY:  reports that he has never smoked. He has never used smokeless tobacco. He reports that he does not drink alcohol or use illicit drugs.  REVIEW OF SYSTEMS:  Other than that discussed above is noncontributory.  PHYSICAL EXAMINATION: ECOG PERFORMANCE STATUS: 0 - Asymptomatic  Blood pressure 142/70, pulse 67, temperature 97.8 F (36.6 C), temperature source Oral, resp. rate 18, weight 241 lb (109.317 kg), SpO2 99 %.  GENERAL:alert, no distress and comfortable SKIN: skin color, texture, turgor are normal, no rashes or significant lesions EYES: PERLA; Conjunctiva are pink and non-injected, sclera clear SINUSES: No redness or tenderness over maxillary or ethmoid sinuses OROPHARYNX:no exudate, no  erythema on lips, buccal mucosa, or tongue. NECK: supple, thyroid normal size, non-tender, without nodularity. No masses CHEST: Normal AP diameter with no breast masses. LYMPH:  no palpable lymphadenopathy in the cervical, axillary or inguinal LUNGS: clear to auscultation and percussion with  normal breathing effort HEART: regular rate & rhythm and no murmurs. ABDOMEN:abdomen soft, non-tender and normal bowel sounds MUSCULOSKELETAL:no cyanosis of digits and no clubbing. Range of motion normal.  NEURO: alert & oriented x 3 with fluent speech, no focal motor/sensory deficits   LABORATORY DATA: Lab on 11/12/2014  Component Date Value Ref Range Status  . WBC 11/12/2014 5.2  4.0 - 10.5 K/uL Final  . RBC 11/12/2014 4.91  4.22 - 5.81 MIL/uL Final  . Hemoglobin 11/12/2014 15.0  13.0 - 17.0 g/dL Final  . HCT 11/12/2014 42.3  39.0 - 52.0 % Final  . MCV 11/12/2014 86.2  78.0 - 100.0 fL Final  . MCH 11/12/2014 30.5  26.0 - 34.0 pg Final  . MCHC 11/12/2014 35.5  30.0 - 36.0 g/dL Final  . RDW 11/12/2014 13.5  11.5 - 15.5 % Final  . Platelets 11/12/2014 PLATELET CLUMPS NOTED ON SMEAR, COUNT APPEARS ADEQUATE  150 - 400 K/uL Final  . Neutrophils Relative % 11/12/2014 56  43 - 77 % Final  . Neutro Abs 11/12/2014 2.9  1.7 - 7.7 K/uL Final  . Lymphocytes Relative 11/12/2014 32  12 - 46 % Final  . Lymphs Abs 11/12/2014 1.7  0.7 - 4.0 K/uL Final  . Monocytes Relative 11/12/2014 9  3 - 12 % Final  . Monocytes Absolute 11/12/2014 0.5  0.1 - 1.0 K/uL Final  . Eosinophils Relative 11/12/2014 3  0 - 5 % Final  . Eosinophils Absolute 11/12/2014 0.1  0.0 - 0.7 K/uL Final  . Basophils Relative 11/12/2014 0  0 - 1 % Final  . Basophils Absolute 11/12/2014 0.0  0.0 - 0.1 K/uL Final  . Sodium 11/12/2014 138  137 - 147 mEq/L Final  . Potassium 11/12/2014 4.2  3.7 - 5.3 mEq/L Final  . Chloride 11/12/2014 99  96 - 112 mEq/L Final  . CO2 11/12/2014 27  19 - 32 mEq/L Final  . Glucose, Bld 11/12/2014 97  70 - 99 mg/dL Final  . BUN 11/12/2014 20  6 - 23 mg/dL Final  . Creatinine, Ser 11/12/2014 0.97  0.50 - 1.35 mg/dL Final  . Calcium 11/12/2014 9.2  8.4 - 10.5 mg/dL Final  . Total Protein 11/12/2014 7.2  6.0 - 8.3 g/dL Final  . Albumin 11/12/2014 3.9  3.5 - 5.2 g/dL Final  . AST 11/12/2014 41* 0 - 37 U/L  Final  . ALT 11/12/2014 60* 0 - 53 U/L Final  . Alkaline Phosphatase 11/12/2014 113  39 - 117 U/L Final  . Total Bilirubin 11/12/2014 0.5  0.3 - 1.2 mg/dL Final  . GFR calc non Af Amer 11/12/2014 87* >90 mL/min Final  . GFR calc Af Amer 11/12/2014 >90  >90 mL/min Final   Comment: (NOTE) The eGFR has been calculated using the CKD EPI equation. This calculation has not been validated in all clinical situations. eGFR's persistently <90 mL/min signify possible Chronic Kidney Disease.   . Anion gap 11/12/2014 12  5 - 15 Final  . CEA 11/12/2014 0.9  0.0 - 5.0 ng/mL Final   Performed at [facility]  . Ferritin 11/12/2014 143  22 - 322 ng/mL Final   Performed at [facility]: No new pathology.  Urinalysis  No results found for: COLORURINE, APPEARANCEUR, LABSPEC, PHURINE, GLUCOSEU, HGBUR, BILIRUBINUR, KETONESUR, PROTEINUR, UROBILINOGEN, NITRITE, LEUKOCYTESUR  RADIOGRAPHIC STUDIES: No results found.  ASSESSMENT:  #1. Stage II carcinoma the descending colon, status post right hemicolectomy in May of 2007, no postop therapy, no evidence of disease. #2. Diabetes mellitus, resolved after weight loss on no medication at this time. #3. Glaucoma, on treatment. #4. Pernicious anemia, receiving B12 monthly   PLAN:  #1. Watchful expectation to be continued with yearly followup and lab tests. #2. Vitamin B12 1000 mcg intramuscularly monthly. #3. Patient was told to call should any new symptoms occur that are troublesome and persistent. #4. Followup in one year with lab tests   All questions were answered. The patient knows to call the clinic with any problems, questions or concerns. We can certainly see the patient much sooner if necessary.   I spent 25 minutes counseling the patient face to face. The total time spent in the appointment was 30 minutes.    Doroteo Bradford, MD 11/13/2014 9:55 AM  DISCLAIMER:  This note was dictated with voice recognition  software.  Similar sounding words can inadvertently be transcribed inaccurately and may not be corrected upon review.

## 2014-12-14 ENCOUNTER — Emergency Department (HOSPITAL_COMMUNITY): Payer: BC Managed Care – PPO

## 2014-12-14 ENCOUNTER — Emergency Department (HOSPITAL_COMMUNITY)
Admission: EM | Admit: 2014-12-14 | Discharge: 2014-12-14 | Disposition: A | Discharge status: Home / Self Care | Payer: BC Managed Care – PPO | Attending: Emergency Medicine | Admitting: Emergency Medicine

## 2014-12-14 ENCOUNTER — Encounter (HOSPITAL_COMMUNITY): Payer: Self-pay | Admitting: Emergency Medicine

## 2014-12-14 DIAGNOSIS — E119 Type 2 diabetes mellitus without complications: Secondary | ICD-10-CM | POA: Insufficient documentation

## 2014-12-14 DIAGNOSIS — Z85038 Personal history of other malignant neoplasm of large intestine: Secondary | ICD-10-CM | POA: Diagnosis not present

## 2014-12-14 DIAGNOSIS — Z9981 Dependence on supplemental oxygen: Secondary | ICD-10-CM | POA: Diagnosis not present

## 2014-12-14 DIAGNOSIS — H409 Unspecified glaucoma: Secondary | ICD-10-CM | POA: Insufficient documentation

## 2014-12-14 DIAGNOSIS — Z9989 Dependence on other enabling machines and devices: Secondary | ICD-10-CM | POA: Insufficient documentation

## 2014-12-14 DIAGNOSIS — Z79899 Other long term (current) drug therapy: Secondary | ICD-10-CM | POA: Diagnosis not present

## 2014-12-14 DIAGNOSIS — G4730 Sleep apnea, unspecified: Secondary | ICD-10-CM | POA: Diagnosis not present

## 2014-12-14 DIAGNOSIS — Z792 Long term (current) use of antibiotics: Secondary | ICD-10-CM | POA: Diagnosis not present

## 2014-12-14 DIAGNOSIS — R109 Unspecified abdominal pain: Secondary | ICD-10-CM | POA: Diagnosis present

## 2014-12-14 DIAGNOSIS — Z8619 Personal history of other infectious and parasitic diseases: Secondary | ICD-10-CM | POA: Insufficient documentation

## 2014-12-14 DIAGNOSIS — N400 Enlarged prostate without lower urinary tract symptoms: Secondary | ICD-10-CM | POA: Insufficient documentation

## 2014-12-14 DIAGNOSIS — R52 Pain, unspecified: Secondary | ICD-10-CM

## 2014-12-14 DIAGNOSIS — Z791 Long term (current) use of non-steroidal anti-inflammatories (NSAID): Secondary | ICD-10-CM | POA: Insufficient documentation

## 2014-12-14 DIAGNOSIS — Z9089 Acquired absence of other organs: Secondary | ICD-10-CM | POA: Insufficient documentation

## 2014-12-14 LAB — CBC
HCT: 43.9 % (ref 39.0–52.0)
Hemoglobin: 14.8 g/dL (ref 13.0–17.0)
MCH: 30 pg (ref 26.0–34.0)
MCHC: 33.7 g/dL (ref 30.0–36.0)
MCV: 89 fL (ref 78.0–100.0)
PLATELETS: 205 10*3/uL (ref 150–400)
RBC: 4.93 MIL/uL (ref 4.22–5.81)
RDW: 13.5 % (ref 11.5–15.5)
WBC: 9.1 10*3/uL (ref 4.0–10.5)

## 2014-12-14 LAB — HEPATIC FUNCTION PANEL
ALK PHOS: 110 U/L (ref 39–117)
ALT: 39 U/L (ref 0–53)
AST: 26 U/L (ref 0–37)
Albumin: 4 g/dL (ref 3.5–5.2)
BILIRUBIN INDIRECT: 0.2 mg/dL — AB (ref 0.3–0.9)
Bilirubin, Direct: 0.1 mg/dL (ref 0.0–0.3)
TOTAL PROTEIN: 7.6 g/dL (ref 6.0–8.3)
Total Bilirubin: 0.3 mg/dL (ref 0.3–1.2)

## 2014-12-14 LAB — URINALYSIS, ROUTINE W REFLEX MICROSCOPIC
Bilirubin Urine: NEGATIVE
Glucose, UA: NEGATIVE mg/dL
Ketones, ur: NEGATIVE mg/dL
LEUKOCYTES UA: NEGATIVE
NITRITE: NEGATIVE
Protein, ur: NEGATIVE mg/dL
Urobilinogen, UA: 0.2 mg/dL (ref 0.0–1.0)
pH: 6 (ref 5.0–8.0)

## 2014-12-14 LAB — BASIC METABOLIC PANEL
Anion gap: 6 (ref 5–15)
BUN: 15 mg/dL (ref 6–23)
CO2: 28 mmol/L (ref 19–32)
Calcium: 9 mg/dL (ref 8.4–10.5)
Chloride: 104 mEq/L (ref 96–112)
Creatinine, Ser: 0.92 mg/dL (ref 0.50–1.35)
GFR calc non Af Amer: 89 mL/min — ABNORMAL LOW (ref >90)
Glucose, Bld: 97 mg/dL (ref 70–99)
Potassium: 4.2 mmol/L (ref 3.5–5.1)
SODIUM: 138 mmol/L (ref 135–145)

## 2014-12-14 LAB — URINE MICROSCOPIC-ADD ON

## 2014-12-14 LAB — D-DIMER, QUANTITATIVE (NOT AT ARMC): D DIMER QUANT: 0.39 ug{FEU}/mL (ref 0.00–0.48)

## 2014-12-14 MED ORDER — NAPROXEN 500 MG PO TABS: 500.0000 mg | ORAL_TABLET | Freq: Two times a day (BID) | ORAL | Status: DC

## 2014-12-14 MED ORDER — OXYCODONE-ACETAMINOPHEN 5-325 MG PO TABS
1.0000 | ORAL_TABLET | Freq: Once | ORAL | Status: AC
  Administered 2014-12-14: 1 via ORAL
  Filled 2014-12-14: qty 1

## 2014-12-14 MED ORDER — OXYCODONE-ACETAMINOPHEN 5-325 MG PO TABS: 1.0000 | ORAL_TABLET | Freq: Four times a day (QID) | ORAL | Status: DC | PRN

## 2014-12-14 MED ORDER — IBUPROFEN 800 MG PO TABS
800.0000 mg | ORAL_TABLET | Freq: Once | ORAL | Status: AC
  Administered 2014-12-14: 800 mg via ORAL
  Filled 2014-12-14: qty 1

## 2014-12-14 NOTE — Discharge Instructions (Signed)
Follow up with your md next week. °

## 2014-12-14 NOTE — ED Notes (Signed)
Pt reports left flank pain that radiates around to LUQ. Pt states it started about a week ago but continues to get worse with intermittent nausea.

## 2014-12-14 NOTE — ED Notes (Signed)
Have had pain for couple weeks to left lateral chest, radiating to back.  Pain comes and goes.  Have not taken any pain medication.  Pain has increased today.  Rates pain 7.

## 2014-12-14 NOTE — ED Provider Notes (Addendum)
CSN: [id number]     Arrival date & time 12/14/14  1256 History   First MD Initiated Contact with Patient 12/14/14 1622     Chief Complaint  Patient presents with  . Flank Pain     (Consider location/radiation/quality/duration/timing/severity/associated sxs/prior Treatment) Patient is a 63 y.o. male presenting with flank pain. The history is provided by the patient (the pt complains of left flank pain).  Flank Pain This is a new problem. The current episode started more than 2 days ago. The problem occurs constantly. The problem has not changed since onset.Pertinent negatives include no chest pain, no abdominal pain and no headaches. Exacerbated by: movement. Nothing relieves the symptoms.    Past Medical History  Diagnosis Date  . Colon cancer     stage II, diagnosed 2007  . BPH (benign prostatic hyperplasia)   . Staph infection 2008    left side of face  . Depression   . Glaucoma 2012  . Sleep apnea     wears CiPaP at night  . Diabetes mellitus   . Complication of anesthesia     vagal response after umbilcal hernia at [facility]   Past Surgical History  Procedure Laterality Date  . Kidney stone surgery    . Colectomy  2007    right hemicolectomy  . Skin lesion excision      left shoulder/pre cancerous  . Pilonidal cyst / sinus excision  1976  . Appendectomy    . Cardiac catheterization    . Hernia repair      umbilical  . Hemorrhoid surgery  12/31/2011    Procedure: HEMORRHOIDECTOMY;  Surgeon: Jamesetta So;  Location: [facility];  Service: General;  Laterality: N/A;  . Colonoscopy  05/11/10    normal rectum,normal residual colon with some angry appearring anastomtic mucosa with some erosions and oozing, bx unremarkable  . Colonoscopy  05/16/2012    Dr. Gala Romney- normal rectum, sigmoid diverticulosis, anastomotic ulcers. bx= acute ulcer and benign colonic mucosa with intramucosal lympoid aggregates   Family History  Problem Relation Age of Onset  . Anesthesia problems Neg Hx    . Hypotension Neg Hx   . Malignant hyperthermia Neg Hx   . Pseudochol deficiency Neg Hx   . Lung cancer Father   . Diabetes Mother    History  Substance Use Topics  . Smoking status: Never Smoker   . Smokeless tobacco: Never Used  . Alcohol Use: No    Review of Systems  Constitutional: Negative for appetite change and fatigue.  HENT: Negative for congestion, ear discharge and sinus pressure.   Eyes: Negative for discharge.  Respiratory: Negative for cough.   Cardiovascular: Negative for chest pain.  Gastrointestinal: Negative for abdominal pain and diarrhea.  Genitourinary: Positive for flank pain. Negative for frequency and hematuria.  Musculoskeletal: Negative for back pain.  Skin: Negative for rash.  Neurological: Negative for seizures and headaches.  Psychiatric/Behavioral: Negative for hallucinations.      Allergies  Hydromorphone hcl  Home Medications   Prior to Admission medications   Medication Sig Start Date End Date Taking? Authorizing Provider  ALPRAZolam Duanne Moron) 0.5 MG tablet Take 0.5 mg by mouth daily as needed. For anxiety or sleep 10/24/12  Yes Historical Provider, MD  amitriptyline (ELAVIL) 50 MG tablet Take 150 mg by mouth at bedtime.  11/08/11  Yes Historical Provider, MD  cyanocobalamin (,VITAMIN B-12,) 1000 MCG/ML injection 1,000 mcg every 30 (thirty) days. 11/26/14  Yes Historical Provider, MD  lamoTRIgine (LAMICTAL) 200 MG tablet  Take 200 mg by mouth daily.  07/24/14  Yes Historical Provider, MD  LUMIGAN 0.01 % SOLN Place 1 drop into both eyes at bedtime. 10/01/14  Yes Historical Provider, MD  Multiple Vitamin (MULITIVITAMIN WITH MINERALS) TABS Take 1 tablet by mouth daily.   Yes Historical Provider, MD  tamsulosin (FLOMAX) 0.4 MG CAPS capsule Take 0.4 mg by mouth at bedtime.   Yes Historical Provider, MD  timolol (TIMOPTIC) 0.5 % ophthalmic solution Place 1 drop into both eyes daily.  08/26/11  Yes Historical Provider, MD  amoxicillin-clavulanate  (AUGMENTIN) 875-125 MG per tablet Take 1 tablet by mouth 2 (two) times daily. for 10 days 11/30/14   Historical Provider, MD  GUAIFENESIN DAC 30-10-100 MG/5ML solution Take 10 mLs by mouth every 6 (six) hours as needed. For cough 12/03/14   Historical Provider, MD  naproxen (NAPROSYN) 500 MG tablet Take 1 tablet (500 mg total) by mouth 2 (two) times daily. 12/14/14   Maudry Diego, MD  oxyCODONE-acetaminophen (PERCOCET/ROXICET) 5-325 MG per tablet Take 1 tablet by mouth every 6 (six) hours as needed. 12/14/14   Maudry Diego, MD   BP 130/75 mmHg  Pulse 70  Temp(Src) 98.7 F (37.1 C) (Oral)  Resp 18  Ht 5\' 9"  (1.753 m)  Wt 235 lb (106.595 kg)  BMI 34.69 kg/m2  SpO2 96% Physical Exam  Constitutional: He is oriented to person, place, and time. He appears well-developed.  HENT:  Head: Normocephalic.  Eyes: Conjunctivae and EOM are normal. No scleral icterus.  Neck: Neck supple. No thyromegaly present.  Cardiovascular: Normal rate and regular rhythm.  Exam reveals no gallop and no friction rub.   No murmur heard. Pulmonary/Chest: No stridor. He has no wheezes. He has no rales. He exhibits no tenderness.  Abdominal: He exhibits no distension. There is no tenderness. There is no rebound.  Musculoskeletal: Normal range of motion. He exhibits no edema.  Moderate tender left flank  Lymphadenopathy:    He has no cervical adenopathy.  Neurological: He is oriented to person, place, and time. He exhibits normal muscle tone. Coordination normal.  Skin: No rash noted. No erythema.  Psychiatric: He has a normal mood and affect. His behavior is normal.    ED Course  Procedures (including critical care time) Labs Review Labs Reviewed  URINALYSIS, ROUTINE W REFLEX MICROSCOPIC - Abnormal; Notable for the following:    Specific Gravity, Urine >1.030 (*)    Hgb urine dipstick TRACE (*)    All other components within normal limits  BASIC METABOLIC PANEL - Abnormal; Notable for the following:     GFR calc non Af Amer 89 (*)    All other components within normal limits  HEPATIC FUNCTION PANEL - Abnormal; Notable for the following:    Indirect Bilirubin 0.2 (*)    All other components within normal limits  CBC  URINE MICROSCOPIC-ADD ON  D-DIMER, QUANTITATIVE    Imaging Review Dg Chest 2 View  12/14/2014   CLINICAL DATA:  LEFT anterior lower rib pain  EXAM: CHEST  2 VIEW  COMPARISON:  12/06/2012  FINDINGS: Normal heart size and pulmonary vascularity.  Atherosclerotic calcification of thoracic aorta.  Lungs clear.  No pleural effusion or pneumothorax.  Scattered endplate spur formation thoracic spine.  IMPRESSION: No acute abnormalities.   Electronically Signed   By: Lavonia Dana M.D.   On: 12/14/2014 17:08   Ct Renal Stone Study  12/14/2014   CLINICAL DATA:  Left flank pain for 1 week.  Radiates to  left groin.  EXAM: CT ABDOMEN AND PELVIS WITHOUT CONTRAST  TECHNIQUE: Multidetector CT imaging of the abdomen and pelvis was performed following the standard protocol without IV contrast.  COMPARISON:  12/16/2011  FINDINGS: Lung bases are clear. No effusions. Heart is normal size. Scattered coronary artery calcifications noted.  Diffuse fatty infiltration of the liver. Gallbladder, spleen, pancreas, adrenals have an unremarkable unenhanced appearance.  Small low-density lesions in the upper and lower poles of the left kidney, likely small cysts. No hydronephrosis. No renal or ureteral stones. Urinary bladder is unremarkable.  Postoperative changes noted from right hemicolectomy. Stomach and small bowel are decompressed and grossly unremarkable. No free fluid, free air or adenopathy.  No acute bony abnormality or focal bone lesion. Degenerative spurring throughout the thoracolumbar spine.  IMPRESSION: No renal or ureteral stones.  No hydronephrosis.  Fatty infiltration of the liver.  No acute findings in the abdomen or pelvis.   Electronically Signed   By: Rolm Baptise M.D.   On: 12/14/2014 20:17      EKG Interpretation None      MDM   Final diagnoses:  Pain  Flank pain    Neg studies,  Will tx for muscle pain    Maudry Diego, MD 12/14/14 2034  Maudry Diego, MD 12/14/14 2055

## 2014-12-31 ENCOUNTER — Other Ambulatory Visit (HOSPITAL_COMMUNITY): Payer: Self-pay | Admitting: Oncology

## 2014-12-31 DIAGNOSIS — M899 Disorder of bone, unspecified: Secondary | ICD-10-CM | POA: Insufficient documentation

## 2014-12-31 DIAGNOSIS — C182 Malignant neoplasm of ascending colon: Secondary | ICD-10-CM

## 2015-01-01 ENCOUNTER — Encounter (HOSPITAL_COMMUNITY)
Admission: RE | Admit: 2015-01-01 | Discharge: 2015-01-01 | Disposition: A | Discharge status: Home / Self Care | Payer: BC Managed Care – PPO | Source: Ambulatory Visit | Attending: Oncology | Admitting: Oncology

## 2015-01-01 DIAGNOSIS — M899 Disorder of bone, unspecified: Secondary | ICD-10-CM

## 2015-01-01 DIAGNOSIS — C182 Malignant neoplasm of ascending colon: Secondary | ICD-10-CM | POA: Diagnosis present

## 2015-01-01 LAB — GLUCOSE, CAPILLARY: Glucose-Capillary: 80 mg/dL (ref 70–99)

## 2015-01-01 MED ORDER — FLUDEOXYGLUCOSE F - 18 (FDG) INJECTION
12.5000 | Freq: Once | INTRAVENOUS | Status: AC | PRN
  Administered 2015-01-01: 12.5 via INTRAVENOUS

## 2015-01-09 ENCOUNTER — Ambulatory Visit (HOSPITAL_COMMUNITY): Payer: BC Managed Care – PPO | Admitting: Hematology & Oncology

## 2015-06-25 ENCOUNTER — Encounter: Payer: Self-pay | Admitting: Internal Medicine

## 2015-10-20 ENCOUNTER — Encounter: Payer: Self-pay | Admitting: Gastroenterology

## 2015-10-20 ENCOUNTER — Other Ambulatory Visit: Payer: Self-pay

## 2015-10-20 ENCOUNTER — Ambulatory Visit (INDEPENDENT_AMBULATORY_CARE_PROVIDER_SITE_OTHER): Payer: BC Managed Care – PPO | Admitting: Gastroenterology

## 2015-10-20 VITALS — BP 133/81 | HR 73 | Temp 97.9°F | Ht 69.0 in | Wt 251.0 lb

## 2015-10-20 DIAGNOSIS — R1310 Dysphagia, unspecified: Secondary | ICD-10-CM

## 2015-10-20 DIAGNOSIS — R1319 Other dysphagia: Secondary | ICD-10-CM

## 2015-10-20 DIAGNOSIS — K219 Gastro-esophageal reflux disease without esophagitis: Secondary | ICD-10-CM

## 2015-10-20 DIAGNOSIS — R7989 Other specified abnormal findings of blood chemistry: Secondary | ICD-10-CM | POA: Insufficient documentation

## 2015-10-20 DIAGNOSIS — K760 Fatty (change of) liver, not elsewhere classified: Secondary | ICD-10-CM | POA: Diagnosis not present

## 2015-10-20 DIAGNOSIS — R945 Abnormal results of liver function studies: Secondary | ICD-10-CM

## 2015-10-20 DIAGNOSIS — R1314 Dysphagia, pharyngoesophageal phase: Secondary | ICD-10-CM

## 2015-10-20 DIAGNOSIS — C187 Malignant neoplasm of sigmoid colon: Secondary | ICD-10-CM

## 2015-10-20 MED ORDER — OMEPRAZOLE 20 MG PO CPDR
20.0000 mg | DELAYED_RELEASE_CAPSULE | Freq: Every day | ORAL | Status: DC
Start: 2015-10-20 — End: 2016-09-09

## 2015-10-20 NOTE — Assessment & Plan Note (Signed)
Remote typical GERD symptoms had done well up until recently. Describes typical reflux symptoms, questionable LPR with chronic cough, esophageal dysphagia. Will initiate PPI therapy. EGD plus or minus esophageal dilation in the near future.  I have discussed the risks, alternatives, benefits with regards to but not limited to the risk of reaction to medication, bleeding, infection, perforation and the patient is agreeable to proceed. Written consent to be obtained.  Antireflux measures discussed and handout provided. Encouraged at least 10 pound weight loss over the next 2-3 months.

## 2015-10-20 NOTE — Assessment & Plan Note (Signed)
64 year old gentleman with history of fatty liver, bump in AST/ALT since last year. Has gained 15 pounds since that time. Likely source of mild transaminitis.   Instructions for fatty liver: Recommend 1-2# weight loss per week until ideal body weight through exercise & diet. Low fat/cholesterol diet.   Avoid sweets, sodas, fruit juices, sweetened beverages like tea, etc. Gradually increase exercise from 15 min daily up to 1 hr per day 5 days/week. Limit alcohol use.  Recheck numbers in 12/2014 with job and provide [place] with a copy. Await pending EGD. Based on labs, I doubt advanced liver disease but patient is at risk of progression. Further recommendations to follow.

## 2015-10-20 NOTE — Assessment & Plan Note (Signed)
Next colonoscopy due May 2018.

## 2015-10-20 NOTE — Patient Instructions (Signed)
Start omeprazole 20mg  daily before breakfast for acid reflux.   Upper endoscopy to be scheduled via phone.  Please try to lose 10 pounds over the next two months.  Have your liver blood work done every six months as long as they remain elevated.   Instructions for fatty liver: Recommend 1-2# weight loss per week until ideal body weight through exercise & diet. Low fat/cholesterol diet.   Avoid sweets, sodas, fruit juices, sweetened beverages like tea, etc. Gradually increase exercise from 15 min daily up to 1 hr per day 5 days/week. Limit alcohol use.    Gastroesophageal Reflux Disease, Adult Normally, food travels down the esophagus and stays in the stomach to be digested. However, when a person has gastroesophageal reflux disease (GERD), food and stomach acid move back up into the esophagus. When this happens, the esophagus becomes sore and inflamed. Over time, GERD can create small holes (ulcers) in the lining of the esophagus.  CAUSES This condition is caused by a problem with the muscle between the esophagus and the stomach (lower esophageal sphincter, or LES). Normally, the LES muscle closes after food passes through the esophagus to the stomach. When the LES is weakened or abnormal, it does not close properly, and that allows food and stomach acid to go back up into the esophagus. The LES can be weakened by certain dietary substances, medicines, and medical conditions, including:  Tobacco use.  Pregnancy.  Having a hiatal hernia.  Heavy alcohol use.  Certain foods and beverages, such as coffee, chocolate, onions, and peppermint. RISK FACTORS This condition is more likely to develop in:  People who have an increased body weight.  People who have connective tissue disorders.  People who use NSAID medicines. SYMPTOMS Symptoms of this condition include:  Heartburn.  Difficult or painful swallowing.  The feeling of having a lump in the throat.  Abitter taste in  the mouth.  Bad breath.  Having a large amount of saliva.  Having an upset or bloated stomach.  Belching.  Chest pain.  Shortness of breath or wheezing.  Ongoing (chronic) cough or a night-time cough.  Wearing away of tooth enamel.  Weight loss. Different conditions can cause chest pain. Make sure to see your health care provider if you experience chest pain. DIAGNOSIS Your health care provider will take a medical history and perform a physical exam. To determine if you have mild or severe GERD, your health care provider may also monitor how you respond to treatment. You may also have other tests, including:  An endoscopy toexamine your stomach and esophagus with a small camera.  A test thatmeasures the acidity level in your esophagus.  A test thatmeasures how much pressure is on your esophagus.  A barium swallow or modified barium swallow to show the shape, size, and functioning of your esophagus. TREATMENT The goal of treatment is to help relieve your symptoms and to prevent complications. Treatment for this condition may vary depending on how severe your symptoms are. Your health care provider may recommend:  Changes to your diet.  Medicine.  Surgery. HOME CARE INSTRUCTIONS Diet  Follow a diet as recommended by your health care provider. This may involve avoiding foods and drinks such as:  Coffee and tea (with or without caffeine).  Drinks that containalcohol.  Energy drinks and sports drinks.  Carbonated drinks or sodas.  Chocolate and cocoa.  Peppermint and mint flavorings.  Garlic and onions.  Horseradish.  Spicy and acidic foods, including peppers, chili powder, curry  powder, vinegar, hot sauces, and barbecue sauce.  Citrus fruit juices and citrus fruits, such as oranges, lemons, and limes.  Tomato-based foods, such as red sauce, chili, salsa, and pizza with red sauce.  Fried and fatty foods, such as donuts, french fries, potato chips, and  high-fat dressings.  High-fat meats, such as hot dogs and fatty cuts of red and white meats, such as rib eye steak, sausage, ham, and bacon.  High-fat dairy items, such as whole milk, butter, and cream cheese.  Eat small, frequent meals instead of large meals.  Avoid drinking large amounts of liquid with your meals.  Avoid eating meals during the 2-3 hours before bedtime.  Avoid lying down right after you eat.  Do not exercise right after you eat. General Instructions  Pay attention to any changes in your symptoms.  Take over-the-counter and prescription medicines only as told by your health care provider. Do not take aspirin, ibuprofen, or other NSAIDs unless your health care provider told you to do so.  Do not use any tobacco products, including cigarettes, chewing tobacco, and e-cigarettes. If you need help quitting, ask your health care provider.  Wear loose-fitting clothing. Do not wear anything tight around your waist that causes pressure on your abdomen.  Raise (elevate) the head of your bed 6 inches (15cm).  Try to reduce your stress, such as with yoga or meditation. If you need help reducing stress, ask your health care provider.  If you are overweight, reduce your weight to an amount that is healthy for you. Ask your health care provider for guidance about a safe weight loss goal.  Keep all follow-up visits as told by your health care provider. This is important. SEEK MEDICAL CARE IF:  You have new symptoms.  You have unexplained weight loss.  You have difficulty swallowing, or it hurts to swallow.  You have wheezing or a persistent cough.  Your symptoms do not improve with treatment.  You have a hoarse voice. SEEK IMMEDIATE MEDICAL CARE IF:  You have pain in your arms, neck, jaw, teeth, or back.  You feel sweaty, dizzy, or light-headed.  You have chest pain or shortness of breath.  You vomit and your vomit looks like blood or coffee grounds.  You  faint.  Your stool is bloody or black.  You cannot swallow, drink, or eat.   This information is not intended to replace advice given to you by your health care provider. Make sure you discuss any questions you have with your health care provider.   Document Released: 09/15/2005 Document Revised: 08/27/2015 Document Reviewed: 04/02/2015 Elsevier Interactive Patient Education 2016 Else[employer]   Fatty Liver Fatty liver, also called hepatic steatosis or steatohepatitis, is a condition in which too much fat has built up in your liver cells. The liver removes harmful substances from your bloodstream. It produces fluids your body needs. It also helps your body use and store energy from the food you eat. In many cases, fatty liver does not cause symptoms or problems. It is often diagnosed when tests are being done for other reasons. However, over time, fatty liver can cause inflammation that may lead to more serious liver problems, such as scarring of the liver (cirrhosis). CAUSES  Causes of fatty liver may include:   Drinking too much alcohol.  Poor nutrition.  Obesity.  Cushing syndrome.  Diabetes.  Hyperlipidemia.  Pregnancy.  Certain drugs.  Poisons.  Some viral infections. RISK FACTORS You may be more likely to develop fatty  liver if you:  Abuse alcohol.  Are pregnant.  Are overweight.  Have diabetes.  Have hepatitis.  Have a high triglyceride level.  SIGNS AND SYMPTOMS  Fatty liver often does not cause any symptoms. In cases where symptoms develop, they can include:  Fatigue.  Weakness.  Weight loss.  Confusion.   Abdominal pain.  Yellowing of your skin and the white parts of your eyes (jaundice).  Nausea and vomiting. DIAGNOSIS  Fatty liver may be diagnosed by:   Physical exam and medical history.  Blood tests.  Imaging tests, such as an ultrasound, CT scan, or MRI.  Liver biopsy. A small sample of liver tissue is removed using a  needle. The sample is then looked at under a microscope. TREATMENT  Fatty liver is often caused by other health conditions. Treatment for fatty liver may involve medicines and lifestyle changes to manage conditions such as:   Alcoholism.  High cholesterol.  Diabetes.  Being overweight or obese.  HOME CARE INSTRUCTIONS  Eat a healthy diet as directed by your health care provider.  Exercise regularly. This can help you lose weight and control your cholesterol and diabetes. Talk to your health care provider about an exercise plan and which activities are best for you.  Do not drink alcohol.   Take medicines only as directed by your health care provider. SEEK MEDICAL CARE IF: You have difficulty controlling your:  Blood sugar.  Cholesterol.  Alcohol consumption. SEEK IMMEDIATE MEDICAL CARE IF:  You have abdominal pain.  You have jaundice.  You have nausea and vomiting.   This information is not intended to replace advice given to you by your health care provider. Make sure you discuss any questions you have with your health care provider.   Document Released: 01/21/2006 Document Revised: 12/27/2014 Document Reviewed: 04/17/2014 Elsevier Interactive Patient Education [employer].

## 2015-10-20 NOTE — Progress Notes (Signed)
Primary Care Physician: Rory Percy, MD  Primary Gastroenterologist:  Garfield Cornea, MD   Chief Complaint  Patient presents with  . Follow-up    indigestion    HPI: [name] is a 64 y.o. male here for one year follow up visit. Last seen in September 2015 by Dr. Gala Romney for evaluation of change in bowel habits.. Patient has a history of stage II colon cancer with resection in 2007. Last colonoscopy May 2013 for hematochezia. Noted to have friability and multiple 3-4 mm anastomotic ulcers. Also with pitted proximal colonic mucosa status post biopsy. Anastomotic ulcers felt to be ischemic in nature. Pitted colonic mucosa benign. Next colonoscopy planned for May 2018.  Since we last saw him he was found to have left T8 vertebral body lesion being followed by Dr. Tressie Stalker. It was discovered on MRI of the back.PET scan in January 2016 showed low-level hypermetabolism of this lesion felt to be indeterminate. Records and care everywhere indicate that he was negative for multiple myeloma workup.follow-up MRI showed resolution of lesion it was felt he must been related to trauma. He is followed closely by Dr. Tressie Stalker.   Patient presents stating that his bowel movements doing okay. Denies any blood in the stool or melena. Since his last office visit he's had more notable reflux type symptoms. Complains of chronic cough. Wake up at night, regurgitation in mouth. Typical heartburn symptoms as well. Symptoms present several times per week. Has not tried any medication recently. Complains of solid food dysphagia. Remote EGD 20 years ago per patient. Denies abdominal pain or unintentional weight loss. In fact he's gained at least 10-15 pounds in the past one year.  Brought in a copy of his recent labs from PCP back in August. The below for details. Noted to have elevated AST/ALT. Known fatty liver on prior imaging studies. CT in 2012, he had hepatomegaly as well.   Current Outpatient  Prescriptions  Medication Sig Dispense Refill  . ALPRAZolam (XANAX) 0.5 MG tablet Take 0.5 mg by mouth daily as needed. For anxiety or sleep    . Amphetamine Sulfate (EVEKEO) 10 MG TABS Take 10 mg by mouth daily.    . brimonidine (ALPHAGAN) 0.2 % ophthalmic solution Place into both eyes 2 (two) times daily.    . dorzolamide (TRUSOPT) 2 % ophthalmic solution Place 1 drop into both eyes 2 (two) times daily.    Marland Kitchen lamoTRIgine (LAMICTAL) 200 MG tablet Take 200 mg by mouth daily.     Marland Kitchen LUMIGAN 0.01 % SOLN Place 1 drop into both eyes at bedtime.  0  . Multiple Vitamin (MULITIVITAMIN WITH MINERALS) TABS Take 1 tablet by mouth daily.    . nortriptyline (PAMELOR) 50 MG capsule Take 50 mg by mouth at bedtime. Takes 2 tablets at bedtme    . tamsulosin (FLOMAX) 0.4 MG CAPS capsule Take 0.4 mg by mouth at bedtime.     No current facility-administered medications for this visit.    Allergies as of 10/20/2015 - Review Complete 10/20/2015  Allergen Reaction Noted  . Hydromorphone hcl Other (See Comments) 08/14/2009   Past Medical History  Diagnosis Date  . Colon cancer ([place])     stage II, diagnosed 2007  . BPH (benign prostatic hyperplasia)   . Staph infection 2008    left side of face  . Depression   . Glaucoma 2012  . Sleep apnea     wears CiPaP at night  . Diabetes mellitus   . Complication  of anesthesia     vagal response after umbilcal hernia at [facility]   Past Surgical History  Procedure Laterality Date  . Kidney stone surgery    . Colectomy  2007    right hemicolectomy  . Skin lesion excision      left shoulder/pre cancerous  . Pilonidal cyst / sinus excision  1976  . Appendectomy    . Cardiac catheterization    . Hernia repair      umbilical  . Hemorrhoid surgery  12/31/2011    Procedure: HEMORRHOIDECTOMY;  Surgeon: Jamesetta So;  Location: [facility];  Service: General;  Laterality: N/A;  . Colonoscopy  05/11/10    normal rectum,normal residual colon with some angry appearring  anastomtic mucosa with some erosions and oozing, bx unremarkable  . Colonoscopy  05/16/2012    Dr. Gala Romney- normal rectum, sigmoid diverticulosis, anastomotic ulcers. bx= acute ulcer and benign colonic mucosa with intramucosal lympoid aggregates   Family History  Problem Relation Age of Onset  . Anesthesia problems Neg Hx   . Hypotension Neg Hx   . Malignant hyperthermia Neg Hx   . Pseudochol deficiency Neg Hx   . Lung cancer Father   . Diabetes Mother    Social History   Social History  . Marital Status: Married    Spouse Name: N/A  . Number of Children: 1  . Years of Education: N/A   Occupational History  . school system    Social History Main Topics  . Smoking status: Never Smoker   . Smokeless tobacco: Never Used     Comment: Never smoked  . Alcohol Use: No  . Drug Use: No  . Sexual Activity: Not Asked   Other Topics Concern  . None   Social History Narrative    ROS:  General: Negative for anorexia, weight loss, fever, chills, fatigue, weakness. ENT: Negative for hoarseness, nasal congestion. See hpi. CV: Negative for chest pain, angina, palpitations, dyspnea on exertion, peripheral edema.  Respiratory: Negative for dyspnea at rest, dyspnea on exertion, cough, sputum, wheezing.  GI: See history of present illness. GU:  Negative for dysuria, hematuria, urinary incontinence, urinary frequency, nocturnal urination.  Endo: Negative for unusual weight change.    Physical Examination:   BP 133/81 mmHg  Pulse 73  Temp(Src) 97.9 F (36.6 C) (Oral)  Ht '5\' 9"'  (1.753 m)  Wt 251 lb (113.853 kg)  BMI 37.05 kg/m2  General: Well-nourished, well-developed in no acute distress.  Eyes: No icterus. Mouth: Oropharyngeal mucosa moist and pink , no lesions erythema or exudate. Lungs: Clear to auscultation bilaterally.  Heart: Regular rate and rhythm, no murmurs rubs or gallops.  Abdomen: Bowel sounds are normal, nontender, nondistended, no hepatosplenomegaly or masses, no  abdominal bruits or hernia , no rebound or guarding.   Extremities: No lower extremity edema. No clubbing or deformities. Neuro: Alert and oriented x 4   Skin: Warm and dry, no jaundice.   Psych: Alert and cooperative, normal mood and affect.  Labs:  Labs from 08/01/2015 White blood cell count 5800, hemoglobin 14.3, hematocrit 42.1, MCV 88, platelets 179,000, BUN 11, creatinine 0.9, total bilirubin 0.3, alkaline phosphatase 114, AST 47, ALT 68, albumin 4.0   Imaging Studies: No results found.

## 2015-10-21 DIAGNOSIS — A048 Other specified bacterial intestinal infections: Secondary | ICD-10-CM

## 2015-10-21 HISTORY — DX: Other specified bacterial intestinal infections: A04.8

## 2015-10-28 ENCOUNTER — Other Ambulatory Visit: Payer: Self-pay

## 2015-10-28 ENCOUNTER — Encounter (HOSPITAL_COMMUNITY)
Admission: RE | Disposition: A | Discharge status: Home / Self Care | Payer: Self-pay | Source: Ambulatory Visit | Attending: Internal Medicine

## 2015-10-28 ENCOUNTER — Ambulatory Visit (HOSPITAL_COMMUNITY)
Admission: RE | Admit: 2015-10-28 | Discharge: 2015-10-28 | Disposition: A | Discharge status: Home / Self Care | Payer: BC Managed Care – PPO | Source: Ambulatory Visit | Attending: Internal Medicine | Admitting: Internal Medicine

## 2015-10-28 ENCOUNTER — Encounter (HOSPITAL_COMMUNITY): Payer: Self-pay

## 2015-10-28 DIAGNOSIS — Z79899 Other long term (current) drug therapy: Secondary | ICD-10-CM | POA: Diagnosis not present

## 2015-10-28 DIAGNOSIS — E119 Type 2 diabetes mellitus without complications: Secondary | ICD-10-CM | POA: Diagnosis not present

## 2015-10-28 DIAGNOSIS — R1310 Dysphagia, unspecified: Secondary | ICD-10-CM | POA: Insufficient documentation

## 2015-10-28 DIAGNOSIS — K2289 Other specified disease of esophagus: Secondary | ICD-10-CM | POA: Insufficient documentation

## 2015-10-28 DIAGNOSIS — K229 Disease of esophagus, unspecified: Secondary | ICD-10-CM

## 2015-10-28 DIAGNOSIS — K3189 Other diseases of stomach and duodenum: Secondary | ICD-10-CM | POA: Diagnosis not present

## 2015-10-28 DIAGNOSIS — Z85038 Personal history of other malignant neoplasm of large intestine: Secondary | ICD-10-CM | POA: Diagnosis not present

## 2015-10-28 DIAGNOSIS — G4730 Sleep apnea, unspecified: Secondary | ICD-10-CM | POA: Insufficient documentation

## 2015-10-28 DIAGNOSIS — K219 Gastro-esophageal reflux disease without esophagitis: Secondary | ICD-10-CM | POA: Diagnosis present

## 2015-10-28 DIAGNOSIS — B9681 Helicobacter pylori [H. pylori] as the cause of diseases classified elsewhere: Secondary | ICD-10-CM | POA: Insufficient documentation

## 2015-10-28 DIAGNOSIS — Z9049 Acquired absence of other specified parts of digestive tract: Secondary | ICD-10-CM | POA: Diagnosis not present

## 2015-10-28 DIAGNOSIS — K228 Other specified diseases of esophagus: Secondary | ICD-10-CM | POA: Insufficient documentation

## 2015-10-28 DIAGNOSIS — F329 Major depressive disorder, single episode, unspecified: Secondary | ICD-10-CM | POA: Insufficient documentation

## 2015-10-28 DIAGNOSIS — K2950 Unspecified chronic gastritis without bleeding: Secondary | ICD-10-CM | POA: Insufficient documentation

## 2015-10-28 DIAGNOSIS — K449 Diaphragmatic hernia without obstruction or gangrene: Secondary | ICD-10-CM | POA: Insufficient documentation

## 2015-10-28 HISTORY — PX: ESOPHAGOGASTRODUODENOSCOPY: SHX5428

## 2015-10-28 HISTORY — PX: ESOPHAGEAL DILATION: SHX303

## 2015-10-28 SURGERY — EGD (ESOPHAGOGASTRODUODENOSCOPY)
Anesthesia: Moderate Sedation

## 2015-10-28 MED ORDER — LIDOCAINE VISCOUS 2 % MT SOLN
OROMUCOSAL | Status: DC | PRN
  Administered 2015-10-28: 3 mL via OROMUCOSAL

## 2015-10-28 MED ORDER — ONDANSETRON HCL 4 MG/2ML IJ SOLN
INTRAMUSCULAR | Status: DC | PRN
  Administered 2015-10-28: 4 mg via INTRAVENOUS

## 2015-10-28 MED ORDER — MIDAZOLAM HCL 5 MG/5ML IJ SOLN
INTRAMUSCULAR | Status: AC
  Filled 2015-10-28: qty 10

## 2015-10-28 MED ORDER — STERILE WATER FOR IRRIGATION IR SOLN
Status: DC | PRN
  Administered 2015-10-28: 08:00:00

## 2015-10-28 MED ORDER — MEPERIDINE HCL 100 MG/ML IJ SOLN
INTRAMUSCULAR | Status: AC
  Filled 2015-10-28: qty 2

## 2015-10-28 MED ORDER — MIDAZOLAM HCL 5 MG/5ML IJ SOLN
INTRAMUSCULAR | Status: DC | PRN
  Administered 2015-10-28 (×2): 2 mg via INTRAVENOUS
  Administered 2015-10-28: 1 mg via INTRAVENOUS

## 2015-10-28 MED ORDER — LIDOCAINE VISCOUS 2 % MT SOLN
OROMUCOSAL | Status: AC
  Filled 2015-10-28: qty 15

## 2015-10-28 MED ORDER — SODIUM CHLORIDE 0.9 % IV SOLN: INTRAVENOUS | Status: DC

## 2015-10-28 MED ORDER — ONDANSETRON HCL 4 MG/2ML IJ SOLN
INTRAMUSCULAR | Status: AC
  Filled 2015-10-28: qty 2

## 2015-10-28 MED ORDER — MEPERIDINE HCL 100 MG/ML IJ SOLN
INTRAMUSCULAR | Status: DC | PRN
  Administered 2015-10-28: 25 mg via INTRAVENOUS
  Administered 2015-10-28: 50 mg via INTRAVENOUS
  Administered 2015-10-28: 25 mg via INTRAVENOUS

## 2015-10-28 NOTE — Op Note (Signed)
[facility] [address] [place], [address]   ENDOSCOPY PROCEDURE REPORT  PATIENT: [name], [name]  MR#: [id number] BIRTHDATE: 1951/08/27 , 73  yrs. old GENDER: male ENDOSCOPIST: R.  Garfield Cornea, MD FACP FACG REFERRED BY:  Rory Percy, M.D.  Everardo All, M.D. PROCEDURE DATE:  11-19-2015 PROCEDURE:  EGD w/ biopsy INDICATIONS:  GERD; esophageal dysphagia. MEDICATIONS: Versed 5 mg IV and Demerol 100 mg IV in divided doses. Zofran 4 mg IV.  Xylocaine gel orally. ASA CLASS:      Class II  CONSENT: The risks, benefits, limitations, alternatives and imponderables have been discussed.  The potential for biopsy, esophogeal dilation, etc. have also been reviewed.  Questions have been answered.  All parties agreeable.  Please see the history and physical in the medical record for more information.  DESCRIPTION OF PROCEDURE: After the risks benefits and alternatives of the procedure were thoroughly explained, informed consent was obtained.  The EG-[id number] ([id number]) endoscope was introduced through the mouth and advanced to the second portion of the duodenum , limited by Without limitations. The instrument was slowly withdrawn as the mucosa was fully examined. Estimated blood loss is zero unless otherwise noted in this procedure report.    Patient noted to have a 2-3 cm submucosal, hard mass with an 8-9 mm area of central ulceration in the proximal esophagus at about 22-23 cm from the incisors.  It was encroaching on the lumen, however , the esophageal lumen remain widely patent at this level.  Salmon colored epithelium also noted distal esophagus extending above the GE junction in 3 "tongues" about 1-2 cm.  This could be an Accentuated undulating Z line but appeared suspicious for Barrett's epithelium.  There was no esophagitis.  Tubular esophagus easily traversed with the scope.  Stomach empty.  2 cm hiatal hernia. Diffuse next scanning of the gastric mucosa.  No  ulcer infiltrating process seen in the gastric mucosa.  Patent pylorus. Normal-appearing first and second portion of the duodenum.  Biopsies the abnormal stomach, distal esophagus and proximal esophageal mass taken for histologic study.  Please note, the esophageal mass was hard and I had a tangential approach.  Only scant specimen obtained with the biopsy forceps ( even with one pass of the jumbo forceps).  Retroflexed views revealed as previously described.     The scope was then withdrawn from the patient and the procedure completed. No dilation performed COMPLICATIONS: There were no immediate complications. EBL 5 mL ENDOSCOPIC IMPRESSION: Ulcerated proximal esophageal mass as described?"status post biopsy. Abnormal distal esophagus biopsy -  status post biopsy.  Hiatal hernia. Abnormal gastric mucosa doubt clinical significance?"status post biopsy  RECOMMENDATIONS: Chop all meats. Swallowing precautions reviewed.   Continue omeprazole 20 mg daily. Follow-up on pathology. Proceed with a chest CT. Patient may ultimately need an endoscopic ultrasound as well.  REPEAT EXAM:  eSigned:  R. Garfield Cornea, MD Rosalita Chessman [facility] 11-19-15 8:31 AM    CC:  CPT CODES: ICD CODES:  The ICD and CPT codes recommended by this software are interpretations from the data that the clinical staff has captured with the software.  The verification of the translation of this report to the ICD and CPT codes and modifiers is the sole responsibility of the health care institution and practicing physician where this report was generated.  [employer] will not be held responsible for the validity of the ICD and CPT codes included on this report.  AMA assumes no liability for data contained or  not contained herein. CPT is a Designer, television/film set of the [employer].  PATIENT NAME:  [name], [name] MR#: [id number]

## 2015-10-28 NOTE — Interval H&P Note (Signed)
History and Physical Interval Note:  10/28/2015 7:39 AM  [name]  has presented today for surgery, with the diagnosis of GERd, dysphagia  The various methods of treatment have been discussed with the patient and family. After consideration of risks, benefits and other options for treatment, the patient has consented to  Procedure(s) with comments: ESOPHAGOGASTRODUODENOSCOPY (EGD) (N/A) - 0730 ESOPHAGEAL DILATION (N/A) as a surgical intervention .  The patient's history has been reviewed, patient examined, no change in status, stable for surgery.  I have reviewed the patient's chart and labs.  Questions were answered to the patient's satisfaction.     Robert Rourk  Reflux improved with omeprazole. Otherwise, no change. EGD with ED as appropriate/feasible.  The risks, benefits, limitations, alternatives and imponderables have been reviewed with the patient. Potential for esophageal dilation, biopsy, etc. have also been reviewed.  Questions have been answered. All parties agreeable.

## 2015-10-28 NOTE — H&P (View-Only) (Signed)
Primary Care Physician: Rory Percy, MD  Primary Gastroenterologist:  Garfield Cornea, MD   Chief Complaint  Patient presents with  . Follow-up    indigestion    HPI: [name] is a 64 y.o. male here for one year follow up visit. Last seen in September 2015 by Dr. Gala Romney for evaluation of change in bowel habits.. Patient has a history of stage II colon cancer with resection in 2007. Last colonoscopy May 2013 for hematochezia. Noted to have friability and multiple 3-4 mm anastomotic ulcers. Also with pitted proximal colonic mucosa status post biopsy. Anastomotic ulcers felt to be ischemic in nature. Pitted colonic mucosa benign. Next colonoscopy planned for May 2018.  Since we last saw him he was found to have left T8 vertebral body lesion being followed by Dr. Tressie Stalker. It was discovered on MRI of the back.PET scan in January 2016 showed low-level hypermetabolism of this lesion felt to be indeterminate. Records and care everywhere indicate that he was negative for multiple myeloma workup.follow-up MRI showed resolution of lesion it was felt he must been related to trauma. He is followed closely by Dr. Tressie Stalker.   Patient presents stating that his bowel movements doing okay. Denies any blood in the stool or melena. Since his last office visit he's had more notable reflux type symptoms. Complains of chronic cough. Wake up at night, regurgitation in mouth. Typical heartburn symptoms as well. Symptoms present several times per week. Has not tried any medication recently. Complains of solid food dysphagia. Remote EGD 20 years ago per patient. Denies abdominal pain or unintentional weight loss. In fact he's gained at least 10-15 pounds in the past one year.  Brought in a copy of his recent labs from PCP back in August. The below for details. Noted to have elevated AST/ALT. Known fatty liver on prior imaging studies. CT in 2012, he had hepatomegaly as well.   Current Outpatient  Prescriptions  Medication Sig Dispense Refill  . ALPRAZolam (XANAX) 0.5 MG tablet Take 0.5 mg by mouth daily as needed. For anxiety or sleep    . Amphetamine Sulfate (EVEKEO) 10 MG TABS Take 10 mg by mouth daily.    . brimonidine (ALPHAGAN) 0.2 % ophthalmic solution Place into both eyes 2 (two) times daily.    . dorzolamide (TRUSOPT) 2 % ophthalmic solution Place 1 drop into both eyes 2 (two) times daily.    Marland Kitchen lamoTRIgine (LAMICTAL) 200 MG tablet Take 200 mg by mouth daily.     Marland Kitchen LUMIGAN 0.01 % SOLN Place 1 drop into both eyes at bedtime.  0  . Multiple Vitamin (MULITIVITAMIN WITH MINERALS) TABS Take 1 tablet by mouth daily.    . nortriptyline (PAMELOR) 50 MG capsule Take 50 mg by mouth at bedtime. Takes 2 tablets at bedtme    . tamsulosin (FLOMAX) 0.4 MG CAPS capsule Take 0.4 mg by mouth at bedtime.     No current facility-administered medications for this visit.    Allergies as of 10/20/2015 - Review Complete 10/20/2015  Allergen Reaction Noted  . Hydromorphone hcl Other (See Comments) 08/14/2009   Past Medical History  Diagnosis Date  . Colon cancer ([place])     stage II, diagnosed 2007  . BPH (benign prostatic hyperplasia)   . Staph infection 2008    left side of face  . Depression   . Glaucoma 2012  . Sleep apnea     wears CiPaP at night  . Diabetes mellitus   . Complication  of anesthesia     vagal response after umbilcal hernia at [facility]   Past Surgical History  Procedure Laterality Date  . Kidney stone surgery    . Colectomy  2007    right hemicolectomy  . Skin lesion excision      left shoulder/pre cancerous  . Pilonidal cyst / sinus excision  1976  . Appendectomy    . Cardiac catheterization    . Hernia repair      umbilical  . Hemorrhoid surgery  12/31/2011    Procedure: HEMORRHOIDECTOMY;  Surgeon: Jamesetta So;  Location: [facility];  Service: General;  Laterality: N/A;  . Colonoscopy  05/11/10    normal rectum,normal residual colon with some angry appearring  anastomtic mucosa with some erosions and oozing, bx unremarkable  . Colonoscopy  05/16/2012    Dr. Gala Romney- normal rectum, sigmoid diverticulosis, anastomotic ulcers. bx= acute ulcer and benign colonic mucosa with intramucosal lympoid aggregates   Family History  Problem Relation Age of Onset  . Anesthesia problems Neg Hx   . Hypotension Neg Hx   . Malignant hyperthermia Neg Hx   . Pseudochol deficiency Neg Hx   . Lung cancer Father   . Diabetes Mother    Social History   Social History  . Marital Status: Married    Spouse Name: N/A  . Number of Children: 1  . Years of Education: N/A   Occupational History  . school system    Social History Main Topics  . Smoking status: Never Smoker   . Smokeless tobacco: Never Used     Comment: Never smoked  . Alcohol Use: No  . Drug Use: No  . Sexual Activity: Not Asked   Other Topics Concern  . None   Social History Narrative    ROS:  General: Negative for anorexia, weight loss, fever, chills, fatigue, weakness. ENT: Negative for hoarseness, nasal congestion. See hpi. CV: Negative for chest pain, angina, palpitations, dyspnea on exertion, peripheral edema.  Respiratory: Negative for dyspnea at rest, dyspnea on exertion, cough, sputum, wheezing.  GI: See history of present illness. GU:  Negative for dysuria, hematuria, urinary incontinence, urinary frequency, nocturnal urination.  Endo: Negative for unusual weight change.    Physical Examination:   BP 133/81 mmHg  Pulse 73  Temp(Src) 97.9 F (36.6 C) (Oral)  Ht '5\' 9"'  (1.753 m)  Wt 251 lb (113.853 kg)  BMI 37.05 kg/m2  General: Well-nourished, well-developed in no acute distress.  Eyes: No icterus. Mouth: Oropharyngeal mucosa moist and pink , no lesions erythema or exudate. Lungs: Clear to auscultation bilaterally.  Heart: Regular rate and rhythm, no murmurs rubs or gallops.  Abdomen: Bowel sounds are normal, nontender, nondistended, no hepatosplenomegaly or masses, no  abdominal bruits or hernia , no rebound or guarding.   Extremities: No lower extremity edema. No clubbing or deformities. Neuro: Alert and oriented x 4   Skin: Warm and dry, no jaundice.   Psych: Alert and cooperative, normal mood and affect.  Labs:  Labs from 08/01/2015 White blood cell count 5800, hemoglobin 14.3, hematocrit 42.1, MCV 88, platelets 179,000, BUN 11, creatinine 0.9, total bilirubin 0.3, alkaline phosphatase 114, AST 47, ALT 68, albumin 4.0   Imaging Studies: No results found.

## 2015-10-28 NOTE — Discharge Instructions (Signed)
EGD Discharge instructions Please read the instructions outlined below and refer to this sheet in the next few weeks. These discharge instructions provide you with general information on caring for yourself after you leave the hospital. Your doctor may also give you specific instructions. While your treatment has been planned according to the most current medical practices available, unavoidable complications occasionally occur. If you have any problems or questions after discharge, please call your doctor. ACTIVITY  You may resume your regular activity but move at a slower pace for the next 24 hours.   Take frequent rest periods for the next 24 hours.   Walking will help expel (get rid of) the air and reduce the bloated feeling in your abdomen.   No driving for 24 hours (because of the anesthesia (medicine) used during the test).   You may shower.   Do not sign any important legal documents or operate any machinery for 24 hours (because of the anesthesia used during the test).  NUTRITION  Drink plenty of fluids.   You may resume your normal diet.   Begin with a light meal and progress to your normal diet.   Avoid alcoholic beverages for 24 hours or as instructed by your caregiver.  MEDICATIONS  You may resume your normal medications unless your caregiver tells you otherwise.  WHAT YOU CAN EXPECT TODAY  You may experience abdominal discomfort such as a feeling of fullness or gas pains.  FOLLOW-UP  Your doctor will discuss the results of your test with you.  SEEK IMMEDIATE MEDICAL ATTENTION IF ANY OF THE FOLLOWING OCCUR:  Excessive nausea (feeling sick to your stomach) and/or vomiting.   Severe abdominal pain and distention (swelling).   Trouble swallowing.   Temperature over 101 F (37.8 C).   Rectal bleeding or vomiting of blood.    Chopped meats. Chew food thoroughly before swallowing.  Continue omeprazole 20 mg daily  Your stomach and esophagus appeared  abnormal. Biopsies were taken.  I am ordering a CT scan of her chest to further evaluate your esophagus.  Further recommendations to follow.

## 2015-10-31 ENCOUNTER — Other Ambulatory Visit: Payer: Self-pay

## 2015-10-31 ENCOUNTER — Telehealth: Payer: Self-pay | Admitting: Internal Medicine

## 2015-10-31 DIAGNOSIS — K2289 Other specified disease of esophagus: Secondary | ICD-10-CM

## 2015-10-31 DIAGNOSIS — K228 Other specified diseases of esophagus: Secondary | ICD-10-CM

## 2015-10-31 NOTE — Telephone Encounter (Signed)
Spoke to [employer]. Reference ID [id number].Marland Kitchen Chest CT request denied because they did not have a chest x-ray. He does not need a chest x-ray.  An appeal is an option.  However, on further consideration of patient's situation, he would benefit from going directly to endoscopic ultrasound.Let's go ahead and refer patient to see Dr. Ardis Hughs for EUS in [place].

## 2015-10-31 NOTE — Telephone Encounter (Signed)
Referral has been made to Dr.Jacob for EUS. Pt is aware.

## 2015-11-03 ENCOUNTER — Encounter (HOSPITAL_COMMUNITY): Payer: Self-pay | Admitting: Internal Medicine

## 2015-11-03 ENCOUNTER — Telehealth: Payer: Self-pay | Admitting: Internal Medicine

## 2015-11-03 NOTE — Telephone Encounter (Signed)
I called Dr.Jacob's office today and [name] is working on it.

## 2015-11-03 NOTE — Telephone Encounter (Signed)
Pattie with Dr.Jocobs office is working on it.

## 2015-11-03 NOTE — Telephone Encounter (Signed)
[phone] PLEASE CALL PATIENT, HE HAS QUESTION ABOUT HIS ENDO AND WHAT RMR TOLD HIM AFTER THE PROCEDURE.

## 2015-11-03 NOTE — Telephone Encounter (Signed)
I spoke with the pt and answered all of his questions. He was also wondering if we had heard anything from Dr.Jacobs yet about his appt with them?

## 2015-11-04 ENCOUNTER — Other Ambulatory Visit: Payer: Self-pay | Admitting: Internal Medicine

## 2015-11-04 ENCOUNTER — Telehealth: Payer: Self-pay | Admitting: Gastroenterology

## 2015-11-04 ENCOUNTER — Telehealth: Payer: Self-pay

## 2015-11-04 ENCOUNTER — Other Ambulatory Visit: Payer: Self-pay

## 2015-11-04 DIAGNOSIS — K228 Other specified diseases of esophagus: Secondary | ICD-10-CM

## 2015-11-04 DIAGNOSIS — K2289 Other specified disease of esophagus: Secondary | ICD-10-CM

## 2015-11-04 NOTE — Telephone Encounter (Signed)
Yes Dan. I initially was going in the direction of a chest CT but got push back from the insurance Co.  I will push back in their direction.

## 2015-11-04 NOTE — Telephone Encounter (Signed)
-----   Message from Daneil Dolin, MD sent at 11/04/2015  4:23 PM EST ----- I suppose so and then precertification for this chest CT ----- Message -----    From: Marlou Porch, CMA    Sent: 11/04/2015   3:29 PM      To: Daneil Dolin, MD  So do you want [place] to go ahead with Chest x-ray?  Barre Aydelott    ----- Message -----    From: Daneil Dolin, MD    Sent: 11/04/2015   8:28 AM      To: Westly Pam, Claudina Lick, LPN  Now, both myself and Dr. Ardis Hughs recommended chest CT.insurance company pushed back on this. We need to play the game. I need both of you to help get this done. Insurance company wanted a chest x-ray first.  I guess we can do that but he needs a CT scan. The esophageal lesion is concerning.

## 2015-11-04 NOTE — Telephone Encounter (Signed)
EUS scheduled, pt instructed and medications reviewed.  Patient instructions mailed to home.  Patient to call with any questions or concerns.  

## 2015-11-04 NOTE — Telephone Encounter (Signed)
Reviewed referral information for [name].  [name] get him in for EUS  (since next Thursday is holiday, will probably be Thursday Dec 1).  I think CT scan of chest will be helpful prior to that, can you coordinate?  thanks  Patty, He needs upper EUS, radial +/- linear, next available EUS Thursday for abnormal esophagus (proximal esophagus mass).  +MAC

## 2015-11-04 NOTE — Telephone Encounter (Signed)
Orders for chest x-ray are in and pt is aware to go have the xray done. He does not need an appointment.

## 2015-11-05 ENCOUNTER — Other Ambulatory Visit: Payer: Self-pay

## 2015-11-05 ENCOUNTER — Ambulatory Visit (HOSPITAL_COMMUNITY)
Admission: RE | Admit: 2015-11-05 | Discharge: 2015-11-05 | Disposition: A | Discharge status: Home / Self Care | Payer: BC Managed Care – PPO | Source: Ambulatory Visit | Attending: Internal Medicine | Admitting: Internal Medicine

## 2015-11-05 DIAGNOSIS — K229 Disease of esophagus, unspecified: Secondary | ICD-10-CM | POA: Diagnosis not present

## 2015-11-05 DIAGNOSIS — R229 Localized swelling, mass and lump, unspecified: Secondary | ICD-10-CM | POA: Insufficient documentation

## 2015-11-05 DIAGNOSIS — K228 Other specified diseases of esophagus: Secondary | ICD-10-CM

## 2015-11-05 DIAGNOSIS — Z85038 Personal history of other malignant neoplasm of large intestine: Secondary | ICD-10-CM | POA: Diagnosis not present

## 2015-11-05 DIAGNOSIS — K2289 Other specified disease of esophagus: Secondary | ICD-10-CM

## 2015-11-05 NOTE — Telephone Encounter (Signed)
Pt is set up for CT scan on 11/11/15 @ 1:30. He is aware of appointment. PA# [id number]

## 2015-11-05 NOTE — Telephone Encounter (Signed)
Pt called office and states he will go for chest xray today and that his EUS is set for 11/20/2015 with Dr. Ardis Hughs

## 2015-11-07 ENCOUNTER — Encounter (HOSPITAL_COMMUNITY): Payer: Self-pay | Admitting: *Deleted

## 2015-11-11 ENCOUNTER — Ambulatory Visit (HOSPITAL_COMMUNITY)
Admission: RE | Admit: 2015-11-11 | Discharge: 2015-11-11 | Disposition: A | Discharge status: Home / Self Care | Payer: BC Managed Care – PPO | Source: Ambulatory Visit | Attending: Internal Medicine | Admitting: Internal Medicine

## 2015-11-11 ENCOUNTER — Other Ambulatory Visit (HOSPITAL_COMMUNITY): Payer: BC Managed Care – PPO

## 2015-11-11 DIAGNOSIS — R1310 Dysphagia, unspecified: Secondary | ICD-10-CM | POA: Diagnosis present

## 2015-11-11 DIAGNOSIS — K229 Disease of esophagus, unspecified: Secondary | ICD-10-CM | POA: Insufficient documentation

## 2015-11-11 DIAGNOSIS — E119 Type 2 diabetes mellitus without complications: Secondary | ICD-10-CM | POA: Diagnosis not present

## 2015-11-11 DIAGNOSIS — K760 Fatty (change of) liver, not elsewhere classified: Secondary | ICD-10-CM | POA: Diagnosis not present

## 2015-11-11 DIAGNOSIS — K219 Gastro-esophageal reflux disease without esophagitis: Secondary | ICD-10-CM | POA: Insufficient documentation

## 2015-11-11 DIAGNOSIS — R911 Solitary pulmonary nodule: Secondary | ICD-10-CM | POA: Insufficient documentation

## 2015-11-11 DIAGNOSIS — Z85038 Personal history of other malignant neoplasm of large intestine: Secondary | ICD-10-CM | POA: Insufficient documentation

## 2015-11-11 DIAGNOSIS — K228 Other specified diseases of esophagus: Secondary | ICD-10-CM

## 2015-11-11 DIAGNOSIS — K2289 Other specified disease of esophagus: Secondary | ICD-10-CM

## 2015-11-11 LAB — POCT I-STAT CREATININE: Creatinine, Ser: 1 mg/dL (ref 0.61–1.24)

## 2015-11-11 MED ORDER — IOHEXOL 300 MG/ML  SOLN
75.0000 mL | Freq: Once | INTRAMUSCULAR | Status: AC | PRN
  Administered 2015-11-11: 75 mL via INTRAVENOUS

## 2015-11-11 MED ORDER — BARIUM SULFATE 2.1 % PO SUSP
ORAL | Status: DC
Start: 2015-11-11 — End: 2015-11-12
  Filled 2015-11-11: qty 1

## 2015-11-12 ENCOUNTER — Ambulatory Visit (HOSPITAL_COMMUNITY): Payer: BC Managed Care – PPO | Admitting: Hematology & Oncology

## 2015-11-16 NOTE — Progress Notes (Signed)
Called pt this evening and reviewed recent work-up to date including chest CT.  No obvious esophageal abnormality seen.  Reasoning behind proceeding with EUS reviewed.  Incidentally, Calcifications in L main coronary artery noted on CT.  Pt reports no prior stress test or cath (and no exertional symptoms). I recommend he have a cardiology eval by way of his PCP (?stress test vs other studies) as deemed appropriate.   I await EUS findings.

## 2015-11-20 ENCOUNTER — Ambulatory Visit (HOSPITAL_COMMUNITY)
Admission: RE | Admit: 2015-11-20 | Discharge: 2015-11-20 | Disposition: A | Discharge status: Home / Self Care | Payer: BC Managed Care – PPO | Source: Ambulatory Visit | Attending: Gastroenterology | Admitting: Gastroenterology

## 2015-11-20 ENCOUNTER — Encounter (HOSPITAL_COMMUNITY): Payer: Self-pay

## 2015-11-20 ENCOUNTER — Encounter (HOSPITAL_COMMUNITY)
Admission: RE | Disposition: A | Discharge status: Home / Self Care | Payer: Self-pay | Source: Ambulatory Visit | Attending: Gastroenterology

## 2015-11-20 ENCOUNTER — Ambulatory Visit (HOSPITAL_COMMUNITY): Payer: BC Managed Care – PPO | Admitting: Anesthesiology

## 2015-11-20 DIAGNOSIS — K219 Gastro-esophageal reflux disease without esophagitis: Secondary | ICD-10-CM | POA: Insufficient documentation

## 2015-11-20 DIAGNOSIS — Z85038 Personal history of other malignant neoplasm of large intestine: Secondary | ICD-10-CM | POA: Insufficient documentation

## 2015-11-20 DIAGNOSIS — K2289 Other specified disease of esophagus: Secondary | ICD-10-CM

## 2015-11-20 DIAGNOSIS — N400 Enlarged prostate without lower urinary tract symptoms: Secondary | ICD-10-CM | POA: Insufficient documentation

## 2015-11-20 DIAGNOSIS — G4730 Sleep apnea, unspecified: Secondary | ICD-10-CM | POA: Diagnosis not present

## 2015-11-20 DIAGNOSIS — Z9049 Acquired absence of other specified parts of digestive tract: Secondary | ICD-10-CM | POA: Diagnosis not present

## 2015-11-20 DIAGNOSIS — E119 Type 2 diabetes mellitus without complications: Secondary | ICD-10-CM | POA: Insufficient documentation

## 2015-11-20 DIAGNOSIS — K229 Disease of esophagus, unspecified: Secondary | ICD-10-CM

## 2015-11-20 DIAGNOSIS — K228 Other specified diseases of esophagus: Secondary | ICD-10-CM

## 2015-11-20 DIAGNOSIS — F329 Major depressive disorder, single episode, unspecified: Secondary | ICD-10-CM | POA: Insufficient documentation

## 2015-11-20 DIAGNOSIS — H409 Unspecified glaucoma: Secondary | ICD-10-CM | POA: Insufficient documentation

## 2015-11-20 HISTORY — PX: EUS: SHX5427

## 2015-11-20 LAB — GLUCOSE, CAPILLARY: Glucose-Capillary: 94 mg/dL (ref 65–99)

## 2015-11-20 SURGERY — UPPER ENDOSCOPIC ULTRASOUND (EUS) LINEAR
Anesthesia: Monitor Anesthesia Care

## 2015-11-20 MED ORDER — PROPOFOL 10 MG/ML IV BOLUS
INTRAVENOUS | Status: DC | PRN
  Administered 2015-11-20 (×3): 40 mg via INTRAVENOUS

## 2015-11-20 MED ORDER — PROPOFOL 10 MG/ML IV BOLUS
INTRAVENOUS | Status: AC
  Filled 2015-11-20: qty 60

## 2015-11-20 MED ORDER — LACTATED RINGERS IV SOLN
INTRAVENOUS | Status: DC
  Administered 2015-11-20: 1000 mL via INTRAVENOUS

## 2015-11-20 MED ORDER — PROPOFOL 500 MG/50ML IV EMUL
INTRAVENOUS | Status: DC | PRN
  Administered 2015-11-20: 100 ug/kg/min via INTRAVENOUS

## 2015-11-20 MED ORDER — SODIUM CHLORIDE 0.9 % IV SOLN: INTRAVENOUS | Status: DC

## 2015-11-20 NOTE — Anesthesia Postprocedure Evaluation (Signed)
Anesthesia Post Note  Patient: [name]  Procedure(s) Performed: Procedure(s) (LRB): UPPER ENDOSCOPIC ULTRASOUND (EUS) LINEAR (N/A)  Patient location during evaluation: PACU Anesthesia Type: MAC Level of consciousness: awake and alert Pain management: pain level controlled Vital Signs Assessment: post-procedure vital signs reviewed and stable Respiratory status: spontaneous breathing, nonlabored ventilation, respiratory function stable and patient connected to nasal cannula oxygen Cardiovascular status: stable and blood pressure returned to baseline Anesthetic complications: no    Last Vitals:  Filed Vitals:   11/20/15 0900 11/20/15 0907  BP: 133/72 134/65  Pulse: 71 69  Temp:    Resp: 17 20    Last Pain: There were no vitals filed for this visit.               [place]

## 2015-11-20 NOTE — H&P (Signed)
HPI: This is a man found to have an abnormal proximal esophagus (mass like per EGD), normal CT of chest  Chief complaint is abnormal esophagus per EGD   Past Medical History  Diagnosis Date  . Colon cancer ([place])     stage II, diagnosed 2007  . BPH (benign prostatic hyperplasia)   . Staph infection 2008    left side of face  . Depression   . Glaucoma 2012  . Sleep apnea     wears CiPaP at night  . Diabetes mellitus   . Complication of anesthesia     vagal response after umbilcal hernia at [facility]    Past Surgical History  Procedure Laterality Date  . Kidney stone surgery    . Colectomy  2007    right hemicolectomy  . Skin lesion excision      left shoulder/pre cancerous  . Pilonidal cyst / sinus excision  1976  . Appendectomy    . Cardiac catheterization    . Hernia repair      umbilical  . Hemorrhoid surgery  12/31/2011    Procedure: HEMORRHOIDECTOMY;  Surgeon: Jamesetta So;  Location: [facility];  Service: General;  Laterality: N/A;  . Colonoscopy  05/11/10    normal rectum,normal residual colon with some angry appearring anastomtic mucosa with some erosions and oozing, bx unremarkable  . Colonoscopy  05/16/2012    Dr. Gala Romney- normal rectum, sigmoid diverticulosis, anastomotic ulcers. bx= acute ulcer and benign colonic mucosa with intramucosal lympoid aggregates  . Esophagogastroduodenoscopy N/A 10/28/2015    Procedure: ESOPHAGOGASTRODUODENOSCOPY (EGD);  Surgeon: Daneil Dolin, MD;  Location: [facility];  Service: Endoscopy;  Laterality: N/A;  0730  . Esophageal dilation N/A 10/28/2015    Procedure: ESOPHAGEAL DILATION;  Surgeon: Daneil Dolin, MD;  Location: [facility];  Service: Endoscopy;  Laterality: N/A;    Current Facility-Administered Medications  Medication Dose Route Frequency Provider Last Rate Last Dose  . 0.9 %  sodium chloride infusion   Intravenous Continuous Milus Banister, MD      . lactated ringers infusion   Intravenous Continuous Milus Banister, MD 125 mL/hr at 11/20/15 0728 1,000 mL at 11/20/15 [id number]    Allergies as of 11/04/2015 - Review Complete 10/28/2015  Allergen Reaction Noted  . Hydromorphone hcl Other (See Comments) 08/14/2009    Family History  Problem Relation Age of Onset  . Anesthesia problems Neg Hx   . Hypotension Neg Hx   . Malignant hyperthermia Neg Hx   . Pseudochol deficiency Neg Hx   . Lung cancer Father   . Diabetes Mother     Social History   Social History  . Marital Status: Married    Spouse Name: N/A  . Number of Children: 1  . Years of Education: N/A   Occupational History  . school system    Social History Main Topics  . Smoking status: Never Smoker   . Smokeless tobacco: Never Used     Comment: Never smoked  . Alcohol Use: No  . Drug Use: No  . Sexual Activity: Not on file   Other Topics Concern  . Not on file   Social History Narrative     Physical Exam: BP 168/78 mmHg  Temp(Src) 98 F (36.7 C) (Oral)  Resp 23  Ht 5\' 9"  (1.753 m)  Wt 251 lb (113.853 kg)  BMI 37.05 kg/m2  SpO2 97% Constitutional: generally well-appearing Psychiatric: alert and oriented x3 Abdomen: soft, nontender, nondistended, no obvious ascites,  no peritoneal signs, normal bowel sounds   Assessment and plan: 64 y.o. male with abnormal esophagus per EGD  For upper EUS today   Owens Loffler, MD [facility] 11/20/2015, 7:58 AM

## 2015-11-20 NOTE — Op Note (Signed)
[place], [id number]   ENDOSCOPIC ULTRASOUND PROCEDURE REPORT  PATIENT: [name], [name]  MR#: [id number] BIRTHDATE: August 27, 1951  GENDER: male ENDOSCOPIST: Milus Banister, MD REFERRED BY:  Garfield Cornea, M.D. PROCEDURE DATE:  11/20/2015 PROCEDURE:   Upper EUS ASA CLASS:      Class II INDICATIONS:   1.  submucosal mass noted in proximal esophagus (Dr. Gala Romney EGD recently), Ct scan chest however showed normal esophagus; no dysphagia, no weight loss, he was having GERD but this seems improved with PPI. MEDICATIONS: Monitored anesthesia care  DESCRIPTION OF PROCEDURE:   After the risks benefits and alternatives of the procedure were  explained, informed consent was obtained. The patient was then placed in the left, lateral, decubitus postion and IV sedation was administered. Throughout the procedure, the patients blood pressure, pulse and oxygen saturations were monitored continuously.  Under direct visualization, the Pentax Radial EUS [id number]  endoscope was introduced through the mouth  and advanced to the stomach body . Water was used as necessary to provide an acoustic interface.  Upon completion of the imaging, water was removed and the patient was sent to the recovery room in satisfactory condition.  Endoscopic findings (with standard adult gastroscope and radial echoendoscope) 1. Normal esophagus 2. Normal stomach  EUS findings: 1. Echolayering of the esophagus (proximal, mid, distal) was normal. I did not detect a subepithelial lesion. 2. Limited views of liver, spleen, pancreas were all normal.  ENDOSCOPIC IMPRESSION: The proximal esophageal abnormality was not detected by standard EGD views or EUS views during the examination today.  CT chest recently was also normal.  Possibly the mass noted earlier this month was related to GERD, edema that has since resolved after he started PPI therapy.  Would observe  clinically  RECOMMENDATIONS: Follow clinically.  _______________________________ eSignedMilus Banister, MD 11/20/2015 8:49 AM

## 2015-11-20 NOTE — Transfer of Care (Signed)
Immediate Anesthesia Transfer of Care Note  Patient: [name]  Procedure(s) Performed: Procedure(s): UPPER ENDOSCOPIC ULTRASOUND (EUS) LINEAR (N/A)  Patient Location: PACU and Endoscopy Unit  Anesthesia Type:MAC  Level of Consciousness: awake, alert  and patient cooperative  Airway & Oxygen Therapy: Patient Spontanous Breathing and Patient connected to nasal cannula oxygen  Post-op Assessment: Report given to RN and Post -op Vital signs reviewed and stable  Post vital signs: Reviewed and stable  Last Vitals:  Filed Vitals:   11/20/15 0710 11/20/15 0713  BP:  168/78  Temp: 36.7 C   Resp:  23    Complications: No apparent anesthesia complications

## 2015-11-20 NOTE — Anesthesia Preprocedure Evaluation (Signed)
Anesthesia Evaluation  Patient identified by MRN, date of birth, ID band Patient awake    Reviewed: Allergy & Precautions, NPO status , Patient's Chart, lab work & pertinent test results  Airway Mallampati: II   Neck ROM: full    Dental   Pulmonary sleep apnea ,    breath sounds clear to auscultation       Cardiovascular  Rhythm:regular Rate:Normal     Neuro/Psych Depression  Neuromuscular disease    GI/Hepatic GERD  ,  Endo/Other  diabetes, Type 2  Renal/GU      Musculoskeletal   Abdominal   Peds  Hematology   Anesthesia Other Findings   Reproductive/Obstetrics                             Anesthesia Physical Anesthesia Plan  ASA: III  Anesthesia Plan: MAC   Post-op Pain Management:    Induction: Intravenous  Airway Management Planned: Nasal Cannula  Additional Equipment:   Intra-op Plan:   Post-operative Plan:   Informed Consent: I have reviewed the patients History and Physical, chart, labs and discussed the procedure including the risks, benefits and alternatives for the proposed anesthesia with the patient or authorized representative who has indicated his/her understanding and acceptance.     Plan Discussed with: CRNA, Anesthesiologist and Surgeon  Anesthesia Plan Comments:         Anesthesia Quick Evaluation

## 2015-11-20 NOTE — Discharge Instructions (Signed)

## 2015-11-21 ENCOUNTER — Encounter (HOSPITAL_COMMUNITY): Payer: Self-pay | Admitting: Gastroenterology

## 2015-11-24 ENCOUNTER — Encounter: Payer: Self-pay | Admitting: Internal Medicine

## 2015-12-01 DIAGNOSIS — R0602 Shortness of breath: Secondary | ICD-10-CM | POA: Insufficient documentation

## 2016-01-14 IMAGING — CT CT RENAL STONE PROTOCOL
2 of 4 series · 17 of 46 positions shown, 19 images · non-contrast
Comparison: 12/16/2011

CLINICAL DATA: Left flank pain for 1 week.  Radiates to left groin.

EXAM:
CT ABDOMEN AND PELVIS WITHOUT CONTRAST
TECHNIQUE: Multidetector CT imaging of the abdomen and pelvis was performed
following the standard protocol without IV contrast.

[Series 2: standard/full over (age)lbs 5.0 · axial · 0.91mm/px · z∈[+674,+1114]mm · 14 of 98 slices shown, 16 images]
[im 5/98  soft-tissue]
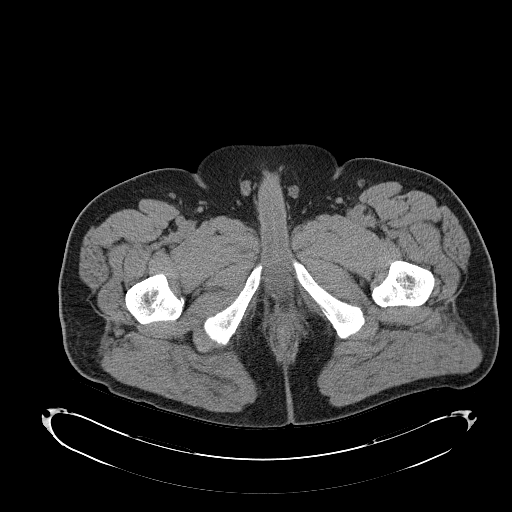
[im 5/98  bone]
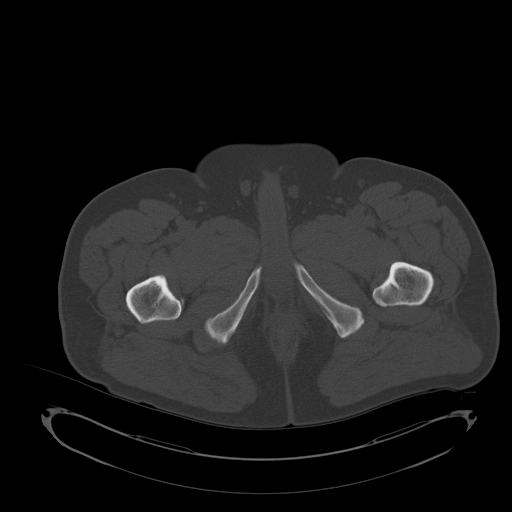
[im 13/98  soft-tissue]
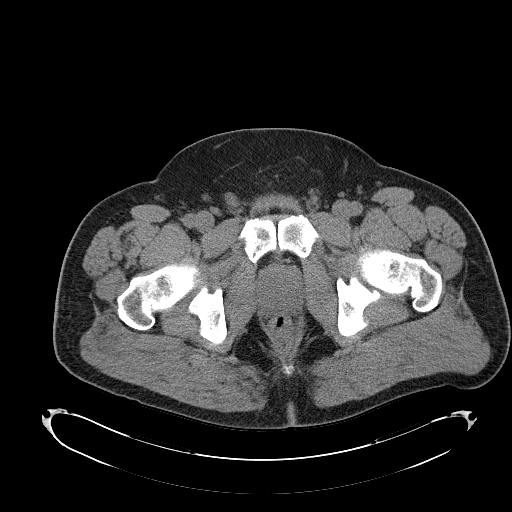
[im 21/98  soft-tissue]
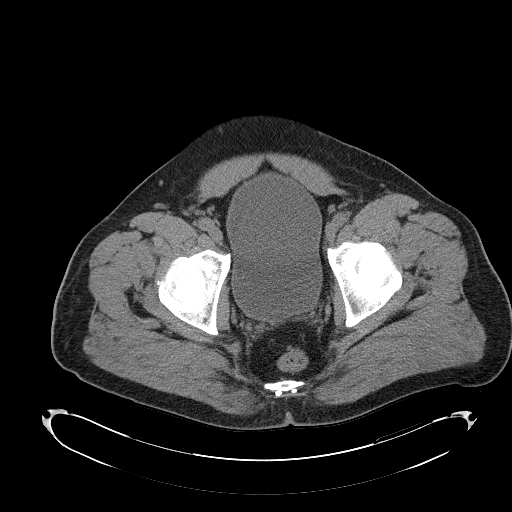
[im 25/98  soft-tissue]
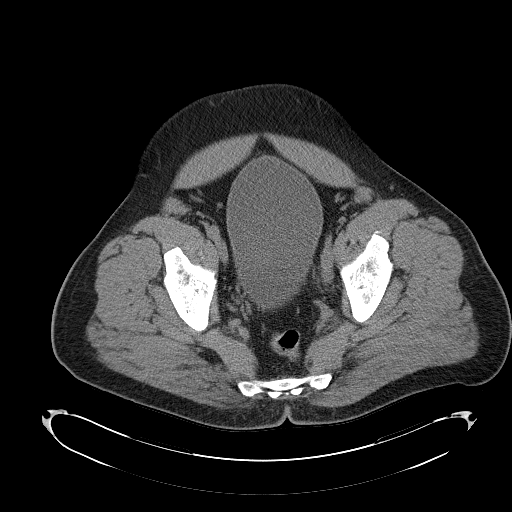
[im 33/98  soft-tissue]
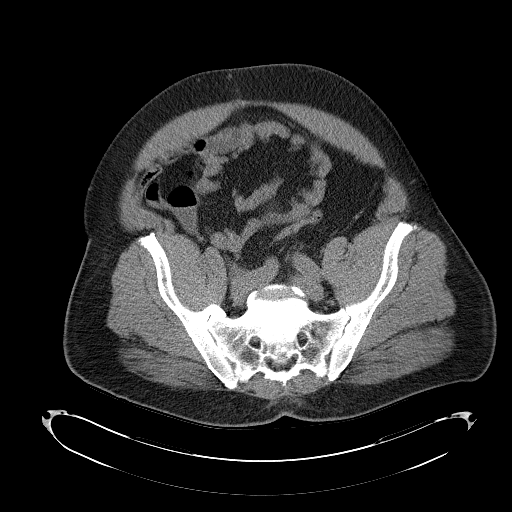
[im 41/98  soft-tissue]
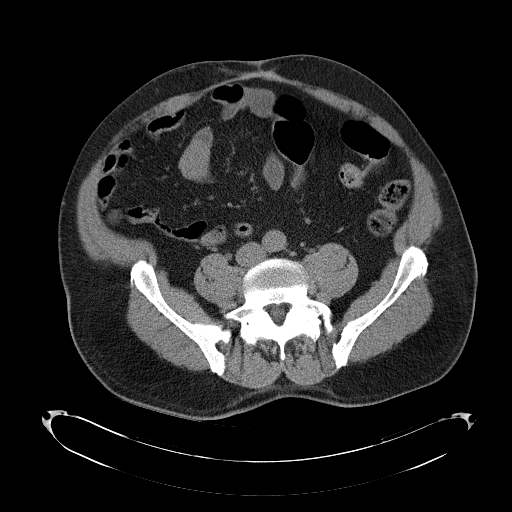
[im 45/98  soft-tissue]
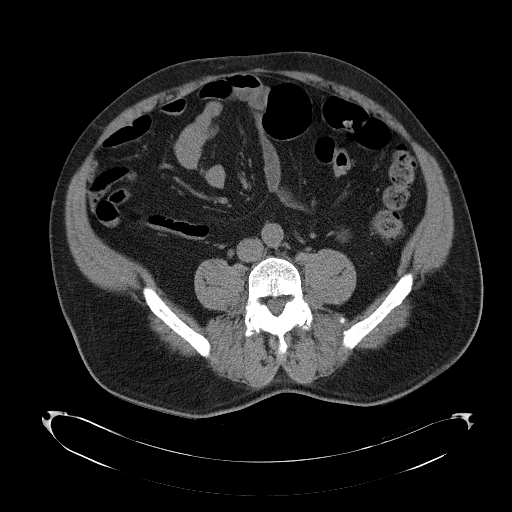
[im 53/98  soft-tissue]
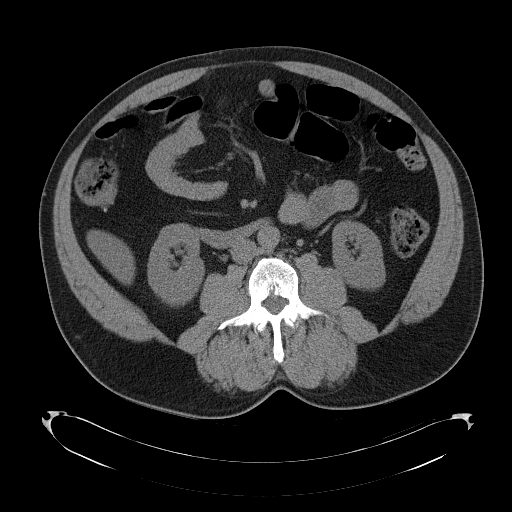
[im 57/98  soft-tissue]
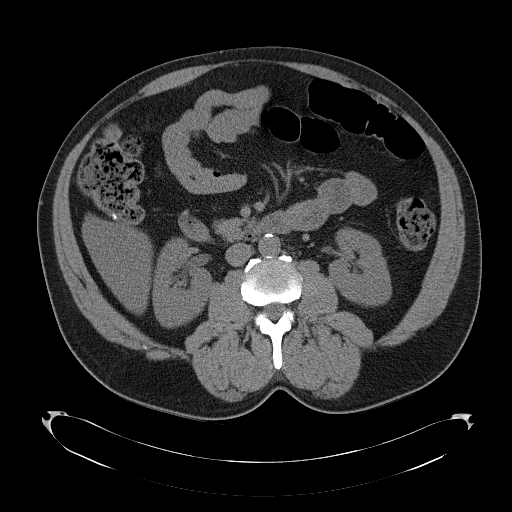
[im 57/98  bone]
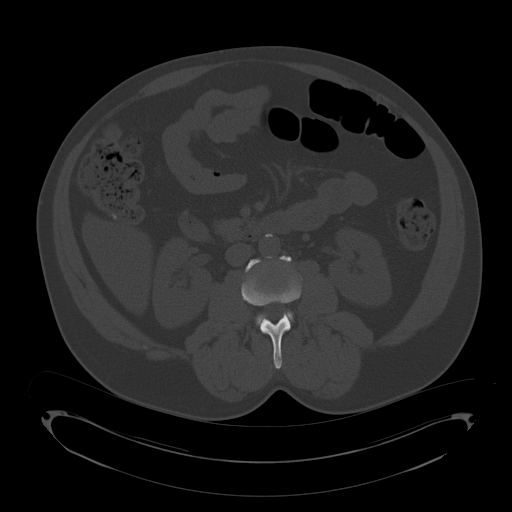
[im 65/98  soft-tissue]
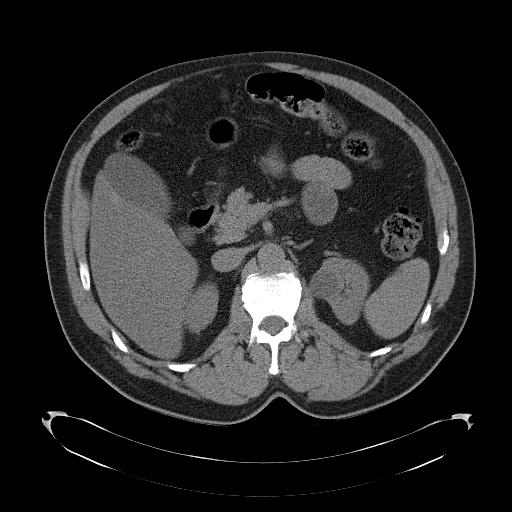
[im 73/98  soft-tissue]
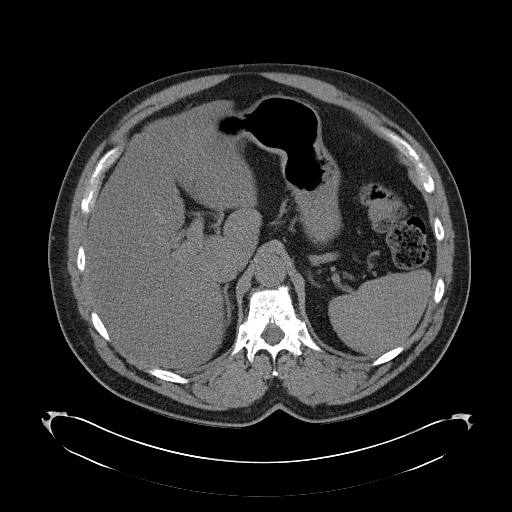
[im 77/98  soft-tissue]
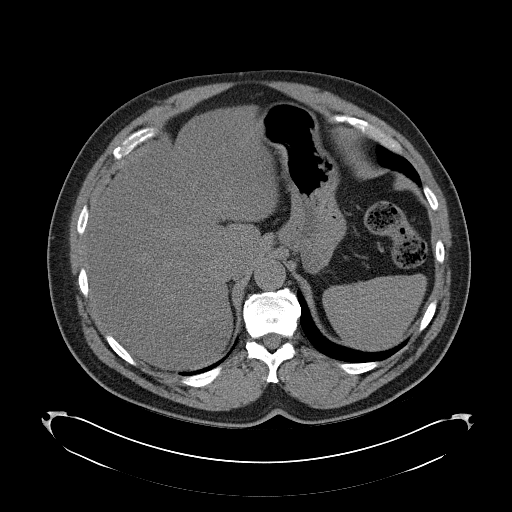
[im 85/98  soft-tissue]
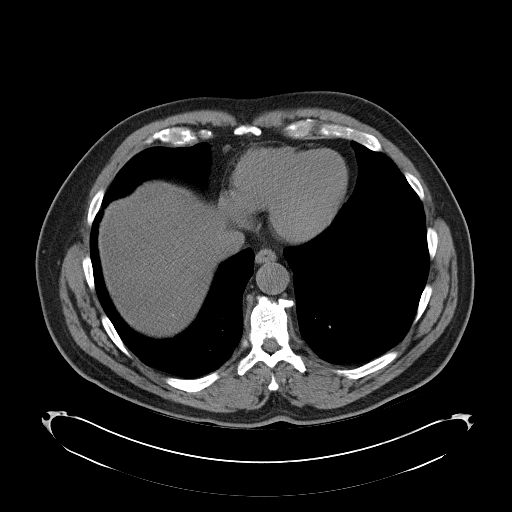
[im 93/98  soft-tissue]
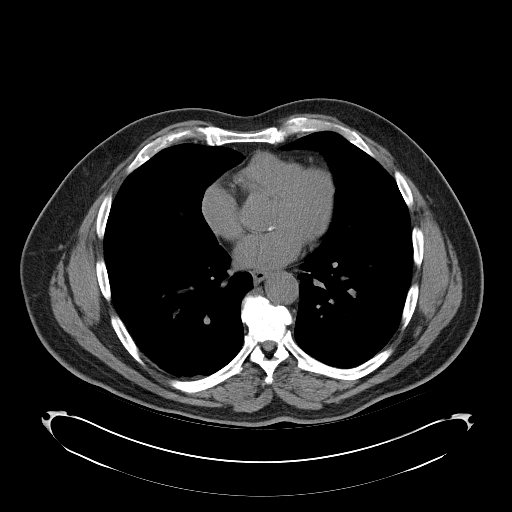

[Series 3: mpr coronal · coronal · 0.87mm/px · 3 of 123 slices shown]
[im 41/123  soft-tissue]
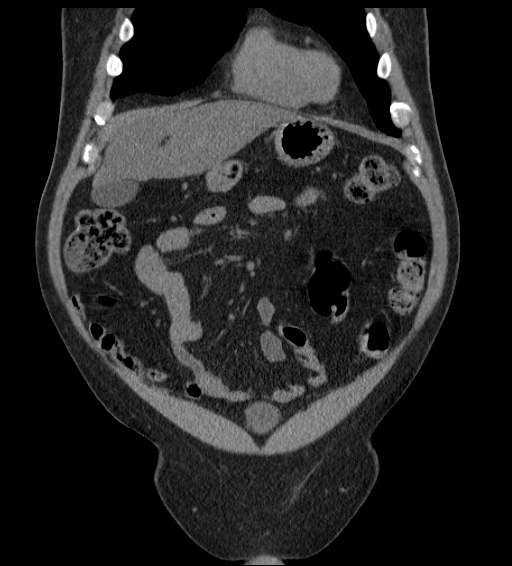
[im 55/123  soft-tissue]
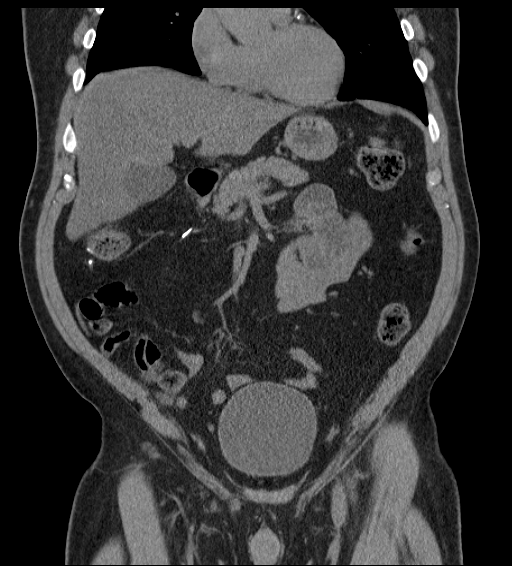
[im 68/123  soft-tissue]
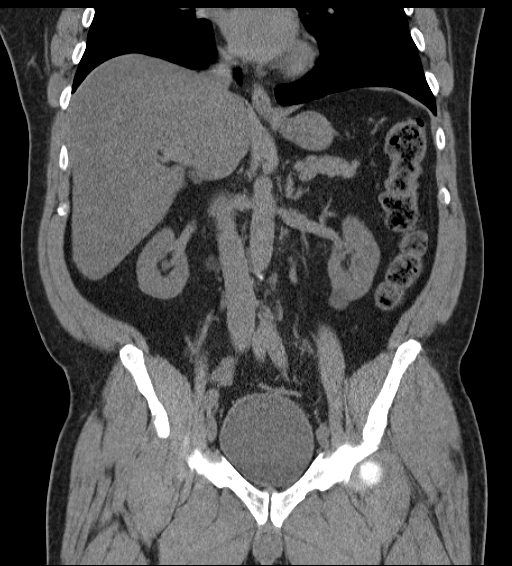

[17 of 46 positions shown; findings below may reference images not displayed]

FINDINGS: Lung bases are clear. No effusions. Heart is normal size. Scattered
coronary artery calcifications noted.

Diffuse fatty infiltration of the liver. Gallbladder, spleen,
pancreas, adrenals have an unremarkable unenhanced appearance.

Small low-density lesions in the upper and lower poles of the left
kidney, likely small cysts. No hydronephrosis. No renal or ureteral
stones. Urinary bladder is unremarkable.

Postoperative changes noted from right hemicolectomy. Stomach and
small bowel are decompressed and grossly unremarkable. No free
fluid, free air or adenopathy.

No acute bony abnormality or focal bone lesion. Degenerative
spurring throughout the thoracolumbar spine.
IMPRESSION: No renal or ureteral stones.  No hydronephrosis.

Fatty infiltration of the liver.

No acute findings in the abdomen or pelvis.

## 2016-02-23 ENCOUNTER — Ambulatory Visit (INDEPENDENT_AMBULATORY_CARE_PROVIDER_SITE_OTHER): Payer: BC Managed Care – PPO | Admitting: Gastroenterology

## 2016-02-23 ENCOUNTER — Encounter: Payer: Self-pay | Admitting: Gastroenterology

## 2016-02-23 VITALS — BP 145/84 | HR 79 | Temp 98.3°F | Ht 69.0 in | Wt 252.2 lb

## 2016-02-23 DIAGNOSIS — K2100 Gastro-esophageal reflux disease with esophagitis, without bleeding: Secondary | ICD-10-CM

## 2016-02-23 DIAGNOSIS — K760 Fatty (change of) liver, not elsewhere classified: Secondary | ICD-10-CM

## 2016-02-23 DIAGNOSIS — B9681 Helicobacter pylori [H. pylori] as the cause of diseases classified elsewhere: Secondary | ICD-10-CM

## 2016-02-23 DIAGNOSIS — K2970 Gastritis, unspecified, without bleeding: Secondary | ICD-10-CM

## 2016-02-23 NOTE — Assessment & Plan Note (Signed)
66 year old gentleman with interesting findings on initial EGD last November, concerning for esophageal mass. Subsequent EGD/EUS by Dr. Ardis Hughs unremarkable 3 weeks later. Chest CT unremarkable as well. Clinically he is doing well. No typical GERD symptoms. Dysphagia resolved. Maintains PPI therapy. H. pylori gastritis is noted. Need to check for eradication. Patient prefers H. pylori stool antigen. Hold PPI therapy for 2 weeks prior to testing.

## 2016-02-23 NOTE — Progress Notes (Signed)
Primary Care Physician: Rory Percy, MD  Primary Gastroenterologist:  Garfield Cornea, MD   Chief Complaint  Patient presents with  . Follow-up    HPI: [name] is a 65 y.o. male here for follow up. H/O stage II colon cancer with resection in 2007. Last colonoscopy May 2013 for hematochezia. Noted to have friability and multiple 3-4 mm anastomotic ulcers. Also with pitted proximal colonic mucosa status post biopsy. Anastomotic ulcers felt to be ischemic in nature. Pitted colonic mucosa benign. Next colonoscopy planned for May 2018. H/O left T8 vertebral body lesion being followed by Dr. Tressie Stalker. It was discovered on MRI of the back.PET scan in January 2016 showed low-level hypermetabolism of this lesion felt to be indeterminate. Records and care everywhere indicate that he was negative for multiple myeloma workup.follow-up MRI showed resolution of lesion it was felt he must been related to trauma. He is followed closely by Dr. Tressie Stalker. H/O abnormal lfts and fatty liver.   EGD in November 2016 for GERD and dysphagia by Dr. Gala Romney. Noted to have a 2-3 cm submucosal, hard mass within 8-9 mm area of central ulceration in the proximal esophagus at 20 01/21/2022 centimeters from the incisors. Salmon-colored epithelium also noted suspicious for Barrett's. No esophagitis. Gastric biopsy with chronic active gastritis with H pylori, subsequently treated, esophagogastric junction biopsy gastric-type mucosa with mixed inflammation and lymphoid follicles. Esophageal mass biopsy benign, it was felt that biopsy may not be sufficient due to the approach. He subsequently underwent an EUS with Dr. Owens Loffler on 11/20/2015. At this time the EGD findings were entirely normal. EUS findings of the esophagus normal. He had a CT of the chest which was normal. Questionable mass related to GERD, edema resolved after starting PPI therapy?.  Patient states that he was able to lose down to 230 pounds but  because of lack of discipline he has gained the weight back. Notes that he is under a lot of stress, taking care of 2 onset and a friend with dementia. His wife takes care of their adult child who has cerebral palsy.  His dysphagia has resolved. Heartburn seems to be well-controlled. He denies abdominal pain or bowel issues. Completed h.pylori treatment as directed.   Current Outpatient Prescriptions  Medication Sig Dispense Refill  . ALPRAZolam (XANAX) 0.5 MG tablet Take 0.25 mg by mouth at bedtime. For anxiety or sleep    . Amphetamine Sulfate (EVEKEO) 10 MG TABS Take 10 mg by mouth daily.    . brimonidine (ALPHAGAN) 0.2 % ophthalmic solution Place 1 drop into both eyes 2 (two) times daily.     . Cyanocobalamin (VITAMIN B-12 PO) Take 1 tablet by mouth daily.    . dorzolamide-timolol (COSOPT) 22.3-6.8 MG/ML ophthalmic solution Place 1 drop into both eyes 2 (two) times daily.    Marland Kitchen lamoTRIgine (LAMICTAL) 200 MG tablet Take 200 mg by mouth daily.     Marland Kitchen LUMIGAN 0.01 % SOLN Place 1 drop into both eyes at bedtime.  0  . metFORMIN (GLUCOPHAGE) 500 MG tablet Take by mouth 2 (two) times daily with a meal.    . Multiple Vitamin (MULITIVITAMIN WITH MINERALS) TABS Take 1 tablet by mouth daily.    . nortriptyline (PAMELOR) 50 MG capsule Take 100 mg by mouth at bedtime.     Marland Kitchen omeprazole (PRILOSEC) 20 MG capsule Take 1 capsule (20 mg total) by mouth daily. 90 capsule 3  . tamsulosin (FLOMAX) 0.4 MG CAPS capsule Take 0.4 mg by mouth  at bedtime.     No current facility-administered medications for this visit.    Allergies as of 02/23/2016  . (No Active Allergies)    ROS:  General: Negative for anorexia, weight loss, fever, chills, fatigue, weakness. ENT: Negative for hoarseness, difficulty swallowing , nasal congestion. CV: Negative for chest pain, angina, palpitations, dyspnea on exertion, peripheral edema.  Respiratory: Negative for dyspnea at rest, dyspnea on exertion, cough, sputum, wheezing.    GI: See history of present illness. GU:  Negative for dysuria, hematuria, urinary incontinence, urinary frequency, nocturnal urination.  Endo: Negative for unusual weight change.    Physical Examination:   BP 145/84 mmHg  Pulse 79  Temp(Src) 98.3 F (36.8 C) (Oral)  Ht '5\' 9"'  (1.753 m)  Wt 252 lb 3.2 oz (114.397 kg)  BMI 37.23 kg/m2  General: Well-nourished, well-developed in no acute distress.  Eyes: No icterus. Mouth: Oropharyngeal mucosa moist and pink , no lesions erythema or exudate. Lungs: Clear to auscultation bilaterally.  Heart: Regular rate and rhythm, no murmurs rubs or gallops.  Abdomen: Bowel sounds are normal, nontender, nondistended, no hepatosplenomegaly or masses, no abdominal bruits or hernia , no rebound or guarding.   Extremities: No lower extremity edema. No clubbing or deformities. Neuro: Alert and oriented x 4   Skin: Warm and dry, no jaundice.   Psych: Alert and cooperative, normal mood and affect.

## 2016-02-23 NOTE — Assessment & Plan Note (Signed)
Patient aware.  Instructions for fatty liver: Recommend 1-2# weight loss per week until ideal body weight through exercise & diet. Low fat/cholesterol diet.   Avoid sweets, sodas, fruit juices, sweetened beverages like tea, etc. Gradually increase exercise from 15 min daily up to 1 hr per day 5 days/week. Limit alcohol use.  We will request most recent labs for review.

## 2016-02-23 NOTE — Patient Instructions (Signed)
Please hold omeprazole for two weeks prior to collecting stool specimen for H.pylori testing. You also cannot be on antibiotics within two weeks of testing.

## 2016-02-24 NOTE — Progress Notes (Signed)
cc'ed to pcp °

## 2016-03-05 LAB — HELICOBACTER PYLORI  SPECIAL ANTIGEN: H. PYLORI Antigen: NOT DETECTED

## 2016-03-12 ENCOUNTER — Telehealth: Payer: Self-pay | Admitting: Internal Medicine

## 2016-03-12 NOTE — Progress Notes (Signed)
Quick Note:    Patient aware of findings  ______

## 2016-03-12 NOTE — Telephone Encounter (Signed)
Talked to the pt, informed him that his hpylori stool test was negative. He said he is doing well at this time.

## 2016-03-12 NOTE — Telephone Encounter (Signed)
[phone]  PLEASE CALL PATIENT REGARDING HIS STOOL SAMPLE TEST  IF IT IS READ

## 2016-03-12 NOTE — Telephone Encounter (Signed)
Thanks

## 2016-07-01 DIAGNOSIS — R1313 Dysphagia, pharyngeal phase: Secondary | ICD-10-CM | POA: Insufficient documentation

## 2016-07-01 DIAGNOSIS — K219 Gastro-esophageal reflux disease without esophagitis: Secondary | ICD-10-CM | POA: Insufficient documentation

## 2016-07-01 DIAGNOSIS — R490 Dysphonia: Secondary | ICD-10-CM | POA: Insufficient documentation

## 2016-07-30 ENCOUNTER — Ambulatory Visit (INDEPENDENT_AMBULATORY_CARE_PROVIDER_SITE_OTHER): Payer: BC Managed Care – PPO | Admitting: Urology

## 2016-07-30 DIAGNOSIS — Z87442 Personal history of urinary calculi: Secondary | ICD-10-CM | POA: Diagnosis not present

## 2016-07-30 DIAGNOSIS — R3911 Hesitancy of micturition: Secondary | ICD-10-CM | POA: Diagnosis not present

## 2016-07-30 DIAGNOSIS — R3912 Poor urinary stream: Secondary | ICD-10-CM | POA: Diagnosis not present

## 2016-07-30 DIAGNOSIS — N401 Enlarged prostate with lower urinary tract symptoms: Secondary | ICD-10-CM

## 2016-08-27 ENCOUNTER — Ambulatory Visit (INDEPENDENT_AMBULATORY_CARE_PROVIDER_SITE_OTHER): Payer: BC Managed Care – PPO | Admitting: Urology

## 2016-08-27 DIAGNOSIS — R3911 Hesitancy of micturition: Secondary | ICD-10-CM

## 2016-08-27 DIAGNOSIS — N401 Enlarged prostate with lower urinary tract symptoms: Secondary | ICD-10-CM

## 2016-08-30 ENCOUNTER — Other Ambulatory Visit: Payer: Self-pay | Admitting: Urology

## 2016-08-30 NOTE — Progress Notes (Signed)
Patient being scheduled for preop for surgery on 09/09/2016.  Needs orders signed off in EPIC.  Thank You!

## 2016-09-06 ENCOUNTER — Encounter (HOSPITAL_COMMUNITY): Payer: Self-pay | Admitting: *Deleted

## 2016-09-06 NOTE — Patient Instructions (Signed)
JERMAR FERRETT  09/06/2016   Your procedure is scheduled on: 09/09/2016    Report to [facility] Main  Entrance take [place]  elevators to 3rd floor to  [place] at    [place] AM.  Call this number if you have problems the morning of surgery [phone]   Remember: ONLY 1 PERSON MAY GO WITH YOU TO SHORT STAY TO GET  READY MORNING OF [place].  Do not eat food or drink liquids :After Midnight.             Eat a good healthy snack prior to bedtime.      Take these medicines the morning of surgery with A SIP OF WATER: Uroxatral, Alphagan eye drops, Cosopt eye drops, Wellbutrin, lamictal  DO NOT TAKE ANY DIABETIC MEDICATIONS DAY OF YOUR SURGERY                               You may not have any metal on your body including hair pins and              piercings  Do not wear jewelry, , lotions, powders or perfumes, deodorant                      Men may shave face and neck.   Do not bring valuables to the hospital. [place].  Contacts, dentures or bridgework may not be worn into surgery.  Leave suitcase in the car. After surgery it may be brought to your room.         Special Instructions: N/A              Please read over the following fact sheets you were given: _____________________________________________________________________             [facility] - Preparing for Surgery Before surgery, you can play an important role.  Because skin is not sterile, your skin needs to be as free of germs as possible.  You can reduce the number of germs on your skin by washing with CHG (chlorahexidine gluconate) soap before surgery.  CHG is an antiseptic cleaner which kills germs and bonds with the skin to continue killing germs even after washing. Please DO NOT use if you have an allergy to CHG or antibacterial soaps.  If your skin becomes reddened/irritated stop using the CHG and inform your nurse when you arrive  at Short Stay. Do not shave (including legs and underarms) for at least 48 hours prior to the first CHG shower.  You may shave your face/neck. Please follow these instructions carefully:  1.  Shower with CHG Soap the night before surgery and the  morning of Surgery.  2.  If you choose to wash your hair, wash your hair first as usual with your  normal  shampoo.  3.  After you shampoo, rinse your hair and body thoroughly to remove the  shampoo.                           4.  Use CHG as you would any other liquid soap.  You can apply chg directly  to the skin and wash  Gently with a scrungie or clean washcloth.  5.  Apply the CHG Soap to your body ONLY FROM THE NECK DOWN.   Do not use on face/ open                           Wound or open sores. Avoid contact with eyes, ears mouth and genitals (private parts).                       Wash face,  Genitals (private parts) with your normal soap.             6.  Wash thoroughly, paying special attention to the area where your surgery  will be performed.  7.  Thoroughly rinse your body with warm water from the neck down.  8.  DO NOT shower/wash with your normal soap after using and rinsing off  the CHG Soap.                9.  Pat yourself dry with a clean towel.            10.  Wear clean pajamas.            11.  Place clean sheets on your bed the night of your first shower and do not  sleep with pets. Day of Surgery : Do not apply any lotions/deodorants the morning of surgery.  Please wear clean clothes to the hospital/surgery center.  FAILURE TO FOLLOW THESE INSTRUCTIONS MAY RESULT IN THE CANCELLATION OF YOUR SURGERY PATIENT SIGNATURE_________________________________  NURSE SIGNATURE__________________________________  ________________________________________________________________________

## 2016-09-08 ENCOUNTER — Encounter (HOSPITAL_COMMUNITY)
Admission: RE | Admit: 2016-09-08 | Discharge: 2016-09-08 | Disposition: A | Discharge status: Home / Self Care | Payer: BC Managed Care – PPO | Source: Ambulatory Visit | Attending: Urology | Admitting: Urology

## 2016-09-08 ENCOUNTER — Encounter (HOSPITAL_COMMUNITY): Payer: Self-pay

## 2016-09-08 DIAGNOSIS — F329 Major depressive disorder, single episode, unspecified: Secondary | ICD-10-CM | POA: Diagnosis not present

## 2016-09-08 DIAGNOSIS — Z85038 Personal history of other malignant neoplasm of large intestine: Secondary | ICD-10-CM | POA: Diagnosis not present

## 2016-09-08 DIAGNOSIS — G4730 Sleep apnea, unspecified: Secondary | ICD-10-CM | POA: Diagnosis not present

## 2016-09-08 DIAGNOSIS — H409 Unspecified glaucoma: Secondary | ICD-10-CM | POA: Diagnosis not present

## 2016-09-08 DIAGNOSIS — R3911 Hesitancy of micturition: Secondary | ICD-10-CM | POA: Diagnosis not present

## 2016-09-08 DIAGNOSIS — Z79899 Other long term (current) drug therapy: Secondary | ICD-10-CM | POA: Diagnosis not present

## 2016-09-08 DIAGNOSIS — K219 Gastro-esophageal reflux disease without esophagitis: Secondary | ICD-10-CM | POA: Diagnosis not present

## 2016-09-08 DIAGNOSIS — E119 Type 2 diabetes mellitus without complications: Secondary | ICD-10-CM | POA: Diagnosis not present

## 2016-09-08 DIAGNOSIS — F064 Anxiety disorder due to known physiological condition: Secondary | ICD-10-CM | POA: Diagnosis not present

## 2016-09-08 DIAGNOSIS — Z9049 Acquired absence of other specified parts of digestive tract: Secondary | ICD-10-CM | POA: Diagnosis not present

## 2016-09-08 DIAGNOSIS — N138 Other obstructive and reflux uropathy: Secondary | ICD-10-CM | POA: Diagnosis not present

## 2016-09-08 DIAGNOSIS — N401 Enlarged prostate with lower urinary tract symptoms: Secondary | ICD-10-CM | POA: Diagnosis present

## 2016-09-08 HISTORY — DX: Unspecified osteoarthritis, unspecified site: M19.90

## 2016-09-08 HISTORY — DX: Gastro-esophageal reflux disease without esophagitis: K21.9

## 2016-09-08 HISTORY — DX: Anemia, unspecified: D64.9

## 2016-09-08 NOTE — Progress Notes (Signed)
EKG on chart from 12/01/2015 and Stress Test results on chart from 11/2015.

## 2016-09-08 NOTE — Progress Notes (Signed)
Spoke with Dr Lamarr Lulas ( anesthesia ) in regards to Naltrexone.  Patient was instructed per Dr Lamarr Lulas not to take am of surgery.   Called Dr Aram Candela office ( cardiology) and requested EKG from 11/2015 and final results of Stress Test from 11/2015.  They are to send.   Labs- 08/31/2016 on chart of CBC/DIFF, CMP and HGBA1C on chart .  LOV Dr Nadara Mustard- 09/07/2016 on chart  12/01/2015- LOV- Cardiology- Dr Hamilton Capri on chart  Stress Test- 12/04/2015 on chart  Living Will on front of chart

## 2016-09-08 NOTE — H&P (Signed)
CC: BPH  HPI: [name] is a 65 year-old male established patient who is here for follow up regarding further evaluation of BPH and lower urinary tract symptoms.    Diovanni returns in f/u for his voiding symptoms. He was placed on Uroxatrol at his last visit in place of tamsulosin BID. He thought he was better but still has some pain in the penile area and persistent voiding symptosm.   GU Hx: He was seen on 8/11 with a 2 months history of hesitancy with a reduced stream that can split. He feels like he is emptying. He has no frequency and nocturia x 1. He has pressure but no dysuria or pain. He has had trace blood but no gross hematuria. He may have had some LLQ pain that is intermittent that is slightly like a stone pain. He has had 3 prior stones and has had cystoscopic extraction with the first and passed the last 2. He has not had one since 1992. He has had a vasectomy but no other GU surgery. He has had no UTI's. He was given Bactrim and had a reaction with hives and then he was given Cipro with minimal improvement. He is on tamsulosin 2 nightly without improvement.      ALLERGIES: Bactrim - Hives, Itching, tightness in throat    MEDICATIONS: Omeprazole 40 mg capsule,delayed release capsule PO Daily  Uroxatral 10 mg tablet, extended release 24 hr 1 tablet PO Daily take daily with meals for bladder emptying.  Alprazolam 0.5 mg tablet 1 tablet PO Daily  Bupropion Hcl Sr 150 mg tablet, extended release 1 tablet PO Daily  Dorzolamide Hcl 2 % drops  Lamotrigine 200 mg tablet 1 tablet PO Daily  Latanoprost 1 PO Daily  Naltrexone Hcl 50 mg tablet 1 tablet PO Daily     GU PSH: None   NON-GU PSH: Hemorrhoidectomy Laparo Partial Colectomy Laparoscopy, Surgical; Repair Umbilical Hernia    GU PMH: BPH w/LUTS, He has hesitancy with a reduced stream on tamsulosin 2 daily. - 07/30/2016 Personal Hx urinary calculi, He has had some left lower quadrant discomfort. His last CT in 1/16 showed no  stones. - 07/30/2016 Urinary Hesitancy - 07/30/2016 Weak Urinary Stream - 07/30/2016      PMH Notes: colon cancer   NON-GU PMH: Anxiety disorder due to known physiological condition Depression Gastro-esophageal reflux disease without esophagitis Other specified glaucoma    FAMILY HISTORY: None   SOCIAL HISTORY: Marital Status: Married Current Smoking Status: Patient has never smoked.  Has never drank.  Does not use drugs. Drinks 3 caffeinated drinks per day.    REVIEW OF SYSTEMS:    GU Review Male:   Patient reports burning/ pain with urination, get up at night to urinate, stream starts and stops, trouble starting your stream, and have to strain to urinate . Patient denies frequent urination, hard to postpone urination, leakage of urine, erection problems, and penile pain.  Gastrointestinal (Upper):   Patient denies nausea, vomiting, and indigestion/ heartburn.  Gastrointestinal (Lower):   Patient denies diarrhea and constipation.  Constitutional:   Patient denies fever, night sweats, weight loss, and fatigue.  Skin:   Patient denies skin rash/ lesion and itching.  Eyes:   Patient denies blurred vision and double vision.  Ears/ Nose/ Throat:   Patient denies sore throat and sinus problems.  Hematologic/Lymphatic:   Patient denies swollen glands and easy bruising.  Cardiovascular:   Patient denies leg swelling and chest pains.  Respiratory:   Patient denies cough  and shortness of breath.  Endocrine:   Patient denies excessive thirst.  Musculoskeletal:   Patient reports back pain. Patient denies joint pain.  Neurological:   Patient denies headaches and dizziness.  Psychologic:   Patient reports anxiety. Patient denies depression.   VITAL SIGNS:      08/27/2016 09:15 AM  BP 115/69 mmHg  Heart Rate 50 /min  Temperature 97.5 F / 36 C   PAST DATA REVIEWED:  Source Of History:  Patient   PROCEDURES:         Flexible Cystoscopy - 52000  Risks, benefits, and some of the potential  complications of the procedure were discussed. 7ml of 2% lidocaine jelly was instilled intraurethrally.  Cipro 500mg  given for antibiotic prophylaxis.     Meatus:  Normal size. Normal location. Normal condition.  Urethra:  No strictures.  External Sphincter:  Normal.  Verumontanum:  Normal.  Prostate:  3-4cm with trilobar hyperplasia with obstructing lateral lobes. there is some inflammation of the mucosa in the prostatic urethral.   Bladder Neck:  Non-obstructing.  Ureteral Orifices:  Normal location. Normal size. Normal shape. Effluxed clear urine.  Bladder:  Mild trabeculation. No tumors. Normal mucosa. No stones.      He has BPH with BOO  The procedure was well tolerated and there were no complications.           Flow Rate - 51741  Flow Time: 0:13 min:sec  Voided Volume: 54 cc  Peak Flow Rate: 4 cc/sec  Time of Peak Flow: 0:04 min:sec  Average Flow Rate: 4 cc/sec  Total Void Time: 0:13 min:sec         Urinalysis - 81003 Dipstick Dipstick Cont'd  Specimen: Voided Bilirubin: Neg  Color: Yellow Ketones: Neg  Appearance: Clear Blood: Neg  Specific Gravity: >= 1.030 Protein: Neg  pH: 6.0 Urobilinogen: 0.2  Glucose: Neg Nitrites: Neg    Leukocyte Esterase: Neg    ASSESSMENT:      ICD-10 Details  1 GU:   BPH w/LUTS - N40.1   2   Urinary Hesitancy - R39.11    PLAN:           Schedule Return Visit: Next Available Appointment - Schedule Surgery          Document Letter(s):  Created for Patient: Clinical Summary         Notes:   He was unable to void after cystoscopy despite 266ml in his bladder and will return later today if he remains unable.   I discussed alternatives to the alfuzosin including the addition of finasteride, laser prostate ablation and TURP. I don't' think he is a good candidate for Urolift.   After reviewing the options he would like to proceed with a TURP. I have reviewed the risks of bleeding, infection, incontinence, stricturing, fluid  overload, injury to adjacent structures, ejaculatory and sexual dysfunction, thrombotic events and anesthetic complications. We will try to get him set up in the next couple of weeks.   CC: Dr. Rory Percy.

## 2016-09-09 ENCOUNTER — Encounter (HOSPITAL_COMMUNITY)
Admission: RE | Disposition: A | Discharge status: Home / Self Care | Payer: Self-pay | Source: Ambulatory Visit | Attending: Urology

## 2016-09-09 ENCOUNTER — Inpatient Hospital Stay (HOSPITAL_COMMUNITY): Payer: BC Managed Care – PPO | Admitting: Certified Registered Nurse Anesthetist

## 2016-09-09 ENCOUNTER — Observation Stay (HOSPITAL_COMMUNITY)
Admission: RE | Admit: 2016-09-09 | Discharge: 2016-09-10 | Disposition: A | Discharge status: Home / Self Care | Payer: BC Managed Care – PPO | Source: Ambulatory Visit | Attending: Urology | Admitting: Urology

## 2016-09-09 ENCOUNTER — Encounter (HOSPITAL_COMMUNITY): Payer: Self-pay | Admitting: *Deleted

## 2016-09-09 DIAGNOSIS — G4730 Sleep apnea, unspecified: Secondary | ICD-10-CM | POA: Insufficient documentation

## 2016-09-09 DIAGNOSIS — Z9049 Acquired absence of other specified parts of digestive tract: Secondary | ICD-10-CM | POA: Insufficient documentation

## 2016-09-09 DIAGNOSIS — N138 Other obstructive and reflux uropathy: Secondary | ICD-10-CM | POA: Insufficient documentation

## 2016-09-09 DIAGNOSIS — F329 Major depressive disorder, single episode, unspecified: Secondary | ICD-10-CM | POA: Insufficient documentation

## 2016-09-09 DIAGNOSIS — Z85038 Personal history of other malignant neoplasm of large intestine: Secondary | ICD-10-CM | POA: Insufficient documentation

## 2016-09-09 DIAGNOSIS — N401 Enlarged prostate with lower urinary tract symptoms: Principal | ICD-10-CM | POA: Insufficient documentation

## 2016-09-09 DIAGNOSIS — Z79899 Other long term (current) drug therapy: Secondary | ICD-10-CM | POA: Insufficient documentation

## 2016-09-09 DIAGNOSIS — H409 Unspecified glaucoma: Secondary | ICD-10-CM | POA: Insufficient documentation

## 2016-09-09 DIAGNOSIS — F064 Anxiety disorder due to known physiological condition: Secondary | ICD-10-CM | POA: Insufficient documentation

## 2016-09-09 DIAGNOSIS — K219 Gastro-esophageal reflux disease without esophagitis: Secondary | ICD-10-CM | POA: Insufficient documentation

## 2016-09-09 DIAGNOSIS — R3911 Hesitancy of micturition: Secondary | ICD-10-CM | POA: Insufficient documentation

## 2016-09-09 DIAGNOSIS — E119 Type 2 diabetes mellitus without complications: Secondary | ICD-10-CM | POA: Insufficient documentation

## 2016-09-09 HISTORY — PX: TRANSURETHRAL RESECTION OF PROSTATE: SHX73

## 2016-09-09 LAB — GLUCOSE, CAPILLARY: GLUCOSE-CAPILLARY: 95 mg/dL (ref 65–99)

## 2016-09-09 SURGERY — TURP (TRANSURETHRAL RESECTION OF PROSTATE)
Anesthesia: General

## 2016-09-09 MED ORDER — LIDOCAINE HCL (CARDIAC) 20 MG/ML IV SOLN
INTRAVENOUS | Status: DC | PRN
  Administered 2016-09-09: 80 mg via INTRAVENOUS

## 2016-09-09 MED ORDER — FENTANYL CITRATE (PF) 100 MCG/2ML IJ SOLN
INTRAMUSCULAR | Status: AC
  Filled 2016-09-09: qty 2

## 2016-09-09 MED ORDER — SODIUM CHLORIDE 0.9 % IR SOLN
Status: DC | PRN
  Administered 2016-09-09: 12000 mL via INTRAVESICAL

## 2016-09-09 MED ORDER — FLEET ENEMA 7-19 GM/118ML RE ENEM: 1.0000 | ENEMA | Freq: Once | RECTAL | Status: DC | PRN

## 2016-09-09 MED ORDER — NALTREXONE HCL 50 MG PO TABS
25.0000 mg | ORAL_TABLET | Freq: Two times a day (BID) | ORAL | Status: DC
  Administered 2016-09-09 – 2016-09-10 (×3): 25 mg via ORAL
  Filled 2016-09-09 (×3): qty 1

## 2016-09-09 MED ORDER — PROPOFOL 10 MG/ML IV BOLUS
INTRAVENOUS | Status: DC | PRN
  Administered 2016-09-09: 200 mg via INTRAVENOUS

## 2016-09-09 MED ORDER — DORZOLAMIDE HCL-TIMOLOL MAL 2-0.5 % OP SOLN
1.0000 [drp] | Freq: Two times a day (BID) | OPHTHALMIC | Status: DC
  Administered 2016-09-09 – 2016-09-10 (×2): 1 [drp] via OPHTHALMIC
  Filled 2016-09-09: qty 10

## 2016-09-09 MED ORDER — ONDANSETRON HCL 4 MG/2ML IJ SOLN
INTRAMUSCULAR | Status: AC
  Filled 2016-09-09: qty 2

## 2016-09-09 MED ORDER — PROPOFOL 10 MG/ML IV BOLUS
INTRAVENOUS | Status: AC
  Filled 2016-09-09: qty 20

## 2016-09-09 MED ORDER — POTASSIUM CHLORIDE IN NACL 20-0.45 MEQ/L-% IV SOLN
INTRAVENOUS | Status: DC
  Administered 2016-09-09 – 2016-09-10 (×3): via INTRAVENOUS
  Filled 2016-09-09 (×4): qty 1000

## 2016-09-09 MED ORDER — ONDANSETRON HCL 4 MG/2ML IJ SOLN
INTRAMUSCULAR | Status: DC | PRN
  Administered 2016-09-09: 4 mg via INTRAVENOUS

## 2016-09-09 MED ORDER — LATANOPROST 0.005 % OP SOLN: 1.0000 [drp] | Freq: Every day | OPHTHALMIC | Status: DC

## 2016-09-09 MED ORDER — FENTANYL CITRATE (PF) 100 MCG/2ML IJ SOLN
INTRAMUSCULAR | Status: DC | PRN
  Administered 2016-09-09 (×3): 25 ug via INTRAVENOUS
  Administered 2016-09-09: 75 ug via INTRAVENOUS
  Administered 2016-09-09: 25 ug via INTRAVENOUS

## 2016-09-09 MED ORDER — CEFAZOLIN SODIUM-DEXTROSE 2-4 GM/100ML-% IV SOLN
INTRAVENOUS | Status: AC
  Filled 2016-09-09: qty 100

## 2016-09-09 MED ORDER — PANTOPRAZOLE SODIUM 40 MG PO TBEC
80.0000 mg | DELAYED_RELEASE_TABLET | Freq: Two times a day (BID) | ORAL | Status: DC
  Administered 2016-09-09 – 2016-09-10 (×2): 80 mg via ORAL
  Filled 2016-09-09 (×2): qty 2

## 2016-09-09 MED ORDER — HYOSCYAMINE SULFATE 0.125 MG SL SUBL
0.1250 mg | SUBLINGUAL_TABLET | SUBLINGUAL | Status: DC | PRN
  Administered 2016-09-09: 0.125 mg via SUBLINGUAL
  Filled 2016-09-09 (×3): qty 1

## 2016-09-09 MED ORDER — LIDOCAINE 2% (20 MG/ML) 5 ML SYRINGE
INTRAMUSCULAR | Status: AC
  Filled 2016-09-09: qty 5

## 2016-09-09 MED ORDER — MIDAZOLAM HCL 5 MG/5ML IJ SOLN
INTRAMUSCULAR | Status: DC | PRN
Start: 2016-09-09 — End: 2016-09-09
  Administered 2016-09-09: 2 mg via INTRAVENOUS

## 2016-09-09 MED ORDER — LACTATED RINGERS IV SOLN
INTRAVENOUS | Status: DC
  Administered 2016-09-09 (×2): via INTRAVENOUS

## 2016-09-09 MED ORDER — DOCUSATE SODIUM 100 MG PO CAPS
100.0000 mg | ORAL_CAPSULE | Freq: Two times a day (BID) | ORAL | Status: DC
  Administered 2016-09-09 – 2016-09-10 (×2): 100 mg via ORAL
  Filled 2016-09-09 (×3): qty 1

## 2016-09-09 MED ORDER — BUPROPION HCL ER (XL) 150 MG PO TB24
450.0000 mg | ORAL_TABLET | Freq: Every day | ORAL | Status: DC
  Administered 2016-09-10: 450 mg via ORAL
  Filled 2016-09-09: qty 3

## 2016-09-09 MED ORDER — AMPHETAMINE SULFATE 10 MG PO TABS: 10.0000 mg | ORAL_TABLET | Freq: Every day | ORAL | Status: DC

## 2016-09-09 MED ORDER — ADULT MULTIVITAMIN W/MINERALS CH
1.0000 | ORAL_TABLET | Freq: Every day | ORAL | Status: DC
  Administered 2016-09-09 – 2016-09-10 (×2): 1 via ORAL
  Filled 2016-09-09 (×2): qty 1

## 2016-09-09 MED ORDER — DEXAMETHASONE SODIUM PHOSPHATE 10 MG/ML IJ SOLN
INTRAMUSCULAR | Status: AC
  Filled 2016-09-09: qty 1

## 2016-09-09 MED ORDER — CEFAZOLIN SODIUM-DEXTROSE 2-4 GM/100ML-% IV SOLN
2.0000 g | INTRAVENOUS | Status: AC
  Administered 2016-09-09: 2 g via INTRAVENOUS

## 2016-09-09 MED ORDER — PANTOPRAZOLE SODIUM 40 MG PO TBEC: 40.0000 mg | DELAYED_RELEASE_TABLET | Freq: Every day | ORAL | Status: DC

## 2016-09-09 MED ORDER — LATANOPROST 0.005 % OP SOLN
1.0000 [drp] | Freq: Every day | OPHTHALMIC | Status: DC
  Administered 2016-09-09: 1 [drp] via OPHTHALMIC
  Filled 2016-09-09: qty 2.5

## 2016-09-09 MED ORDER — BRIMONIDINE TARTRATE 0.2 % OP SOLN
1.0000 [drp] | Freq: Two times a day (BID) | OPHTHALMIC | Status: DC
Start: 2016-09-09 — End: 2016-09-10
  Administered 2016-09-09 – 2016-09-10 (×2): 1 [drp] via OPHTHALMIC
  Filled 2016-09-09: qty 5

## 2016-09-09 MED ORDER — MIDAZOLAM HCL 2 MG/2ML IJ SOLN
INTRAMUSCULAR | Status: AC
  Filled 2016-09-09: qty 2

## 2016-09-09 MED ORDER — MIDAZOLAM HCL 2 MG/2ML IJ SOLN: 0.5000 mg | Freq: Once | INTRAMUSCULAR | Status: DC | PRN

## 2016-09-09 MED ORDER — ONDANSETRON HCL 4 MG/2ML IJ SOLN
4.0000 mg | INTRAMUSCULAR | Status: DC | PRN
  Administered 2016-09-09 – 2016-09-10 (×2): 4 mg via INTRAVENOUS
  Filled 2016-09-09 (×2): qty 2

## 2016-09-09 MED ORDER — LAMOTRIGINE 100 MG PO TABS
200.0000 mg | ORAL_TABLET | Freq: Every day | ORAL | Status: DC
  Administered 2016-09-09 – 2016-09-10 (×2): 200 mg via ORAL
  Filled 2016-09-09 (×2): qty 2

## 2016-09-09 MED ORDER — FENTANYL CITRATE (PF) 100 MCG/2ML IJ SOLN
25.0000 ug | INTRAMUSCULAR | Status: DC | PRN
  Administered 2016-09-09 (×2): 50 ug via INTRAVENOUS

## 2016-09-09 MED ORDER — MAGNESIUM HYDROXIDE 400 MG/5ML PO SUSP
30.0000 mL | Freq: Every day | ORAL | Status: DC | PRN
  Filled 2016-09-09: qty 30

## 2016-09-09 MED ORDER — ACETAMINOPHEN 325 MG PO TABS: 650.0000 mg | ORAL_TABLET | ORAL | Status: DC | PRN

## 2016-09-09 MED ORDER — HYDROCODONE-ACETAMINOPHEN 5-325 MG PO TABS
1.0000 | ORAL_TABLET | ORAL | Status: DC | PRN
  Administered 2016-09-09 – 2016-09-10 (×3): 2 via ORAL
  Filled 2016-09-09 (×3): qty 2

## 2016-09-09 MED ORDER — ALPRAZOLAM 0.25 MG PO TABS
0.2500 mg | ORAL_TABLET | Freq: Every day | ORAL | Status: DC
  Administered 2016-09-09: 0.25 mg via ORAL
  Filled 2016-09-09: qty 1

## 2016-09-09 MED ORDER — SODIUM CHLORIDE 0.9 % IR SOLN
3000.0000 mL | Status: DC
  Administered 2016-09-09: 3000 mL

## 2016-09-09 MED ORDER — MEPERIDINE HCL 50 MG/ML IJ SOLN: 6.2500 mg | INTRAMUSCULAR | Status: DC | PRN

## 2016-09-09 MED ORDER — BISACODYL 10 MG RE SUPP
10.0000 mg | Freq: Every day | RECTAL | Status: DC | PRN
  Administered 2016-09-09: 10 mg via RECTAL
  Filled 2016-09-09: qty 1

## 2016-09-09 MED ORDER — DIPHENHYDRAMINE HCL 50 MG/ML IJ SOLN: 12.5000 mg | Freq: Four times a day (QID) | INTRAMUSCULAR | Status: DC | PRN

## 2016-09-09 MED ORDER — DIPHENHYDRAMINE HCL 12.5 MG/5ML PO ELIX: 12.5000 mg | ORAL_SOLUTION | Freq: Four times a day (QID) | ORAL | Status: DC | PRN

## 2016-09-09 MED ORDER — PROMETHAZINE HCL 25 MG/ML IJ SOLN: 6.2500 mg | INTRAMUSCULAR | Status: DC | PRN

## 2016-09-09 MED ORDER — FENTANYL CITRATE (PF) 100 MCG/2ML IJ SOLN
INTRAMUSCULAR | Status: AC
Start: 2016-09-09 — End: 2016-09-09
  Filled 2016-09-09: qty 2

## 2016-09-09 SURGICAL SUPPLY — 15 items
BAG URINE DRAINAGE (UROLOGICAL SUPPLIES) IMPLANT
BAG URO CATCHER STRL LF (MISCELLANEOUS) ×2 IMPLANT
CATH FOLEY 3WAY 30CC 22FR (CATHETERS) IMPLANT
ELECT REM PT RETURN 9FT ADLT (ELECTROSURGICAL) ×2
ELECTRODE REM PT RTRN 9FT ADLT (ELECTROSURGICAL) ×1 IMPLANT
GLOVE SURG SS PI 8.0 STRL IVOR (GLOVE) IMPLANT
GOWN STRL REUS W/TWL XL LVL3 (GOWN DISPOSABLE) ×2 IMPLANT
HOLDER FOLEY CATH W/STRAP (MISCELLANEOUS) IMPLANT
LOOP CUT BIPOLAR 24F LRG (ELECTROSURGICAL) IMPLANT
MANIFOLD NEPTUNE II (INSTRUMENTS) ×2 IMPLANT
PACK CYSTO (CUSTOM PROCEDURE TRAY) ×2 IMPLANT
SET ASPIRATION TUBING (TUBING) ×2 IMPLANT
SYR 30ML LL (SYRINGE) IMPLANT
SYRINGE IRR TOOMEY STRL 70CC (SYRINGE) IMPLANT
TUBING CONNECTING 10 (TUBING) ×2 IMPLANT

## 2016-09-09 NOTE — Op Note (Signed)
Preoperative diagnosis: 1. Bladder outlet obstruction secondary to BPH  Postoperative diagnosis:  1. Bladder outlet obstruction secondary to BPH  Procedure:  1. Cystoscopy 2. Transurethral Resection of the prostate  Surgeon: Irine Seal. M.D.  Anesthesia: General  Complications: None  EBL: 74ml  Specimens: 1. Prostate chips  Disposition of specimens: Pathology  Indication: [name] is a patient with bladder outlet obstruction secondary to benign prostatic hyperplasia. After reviewing the management options for treatment, he elected to proceed with the above surgical procedure(s). We have discussed the potential benefits and risks of the procedure, side effects of the proposed treatment, the likelihood of the patient achieving the goals of the procedure, and any potential problems that might occur during the procedure or recuperation. Informed consent has been obtained.  Description of procedure:  The patient was taken to the operating room and general anesthesia was induced.  The patient was placed in the dorsal lithotomy position, prepped and draped in the usual sterile fashion, and preoperative antibiotics were administered. A preoperative time-out was performed.   Cystourethroscopy was performed.  The patient's urethra was examined and was normal/ demonstrated bilobar prostatic hypertrophy/ bilobar prostatic hypertrophy with a small median lobe .   The bladder was then systematically examined in its entirety. There was mild trabeculation with no evidence of any bladder tumors, stones, or other mucosal pathology.  The ureteral orifices were identified and marked so as to be avoided during the procedure.  The prostate adenoma was then resected utilizing loop cautery resection with the bipolar cutting loop.  The prostate adenoma from the bladder neck back to the verumontanum was resected beginning at the six o'clock position and then extended to include the right and left  lobes of the prostate and anterior prostate. Care was taken not to resect distal to the verumontanum.  At the completion of the procedure the bladder was evacuated free of chips and hemostasis was insured.  Final inspection revealed intact ureteral orifices, a widely patent TUR channel and an intact external sphincter.   Hemostasis was then achieved with the cautery and the bladder was emptied and reinspected with no significant bleeding noted at the end of the procedure.    A 110fr 3 way catheter was then placed into the bladder and placed on continuous bladder irrigation.  The patient appeared to tolerate the procedure well and without complications.  The patient was able to be awakened and transferred to the recovery unit in satisfactory condition.

## 2016-09-09 NOTE — Interval H&P Note (Signed)
History and Physical Interval Note:  09/09/2016 7:19 AM  [name]  has presented today for surgery, with the diagnosis of BPH WITH BLADDER OUTLAGE OBSTRUCTION  The various methods of treatment have been discussed with the patient and family. After consideration of risks, benefits and other options for treatment, the patient has consented to  Procedure(s): TRANSURETHRAL RESECTION OF THE PROSTATE (TURP) (N/A) as a surgical intervention .  The patient's history has been reviewed, patient examined, no change in status, stable for surgery.  I have reviewed the patient's chart and labs.  Questions were answered to the patient's satisfaction.     Livi Mcgann J

## 2016-09-09 NOTE — Transfer of Care (Signed)
Immediate Anesthesia Transfer of Care Note  Patient: [name]  Procedure(s) Performed: Procedure(s): TRANSURETHRAL RESECTION OF THE PROSTATE (TURP) (N/A)  Patient Location: PACU  Anesthesia Type:General  Level of Consciousness:  sedated, patient cooperative and responds to stimulation  Airway & Oxygen Therapy:Patient Spontanous Breathing and Patient connected to face mask oxgen  Post-op Assessment:  Report given to PACU RN and Post -op Vital signs reviewed and stable  Post vital signs:  Reviewed and stable  Last Vitals:  Vitals:   09/09/16 0529  BP: 126/67  Pulse: (!) 51  Resp: 16  Temp: [id number] C    Complications: No apparent anesthesia complications

## 2016-09-09 NOTE — Care Management Note (Signed)
Case Management Note  Patient Details  Name: [name] MRN: [id number] Date of Birth: 1951-01-29  Subjective/Objective: 65 y/o m admitted w/BPH. From home.                   Action/Plan:d/c plan home.   Expected Discharge Date:                  Expected Discharge Plan:  Home/Self Care  In-House Referral:     Discharge planning Services  CM Consult  Post Acute Care Choice:    Choice offered to:     DME Arranged:    DME Agency:     HH Arranged:    HH Agency:     Status of Service:  In process, will continue to follow  If discussed at Long Length of Stay Meetings, dates discussed:    Additional Comments:  Dessa Phi, RN 09/09/2016, 3:02 PM

## 2016-09-09 NOTE — Anesthesia Preprocedure Evaluation (Addendum)
Anesthesia Evaluation  Patient identified by MRN, date of birth, ID band Patient awake    Reviewed: Allergy & Precautions, NPO status , Patient's Chart, lab work & pertinent test results  History of Anesthesia Complications Negative for: history of anesthetic complications  Airway Mallampati: II  TM Distance: >3 FB Neck ROM: Full    Dental  (+) Dental Advisory Given   Pulmonary sleep apnea and Continuous Positive Airway Pressure Ventilation ,    breath sounds clear to auscultation       Cardiovascular (-) anginanegative cardio ROS   Rhythm:Regular Rate:Normal  '14 Cardiolite, which was NL; EF 62%. '08 cath: normal coronaries   Neuro/Psych Depression    GI/Hepatic Neg liver ROS, GERD  Medicated and Controlled,  Endo/Other  diabetes, Oral Hypoglycemic Agents  Renal/GU negative Renal ROS     Musculoskeletal   Abdominal   Peds  Hematology negative hematology ROS (+)   Anesthesia Other Findings   Reproductive/Obstetrics                            Anesthesia Physical Anesthesia Plan  ASA: III  Anesthesia Plan: General   Post-op Pain Management:    Induction: Intravenous  Airway Management Planned: LMA  Additional Equipment:   Intra-op Plan:   Post-operative Plan:   Informed Consent: I have reviewed the patients History and Physical, chart, labs and discussed the procedure including the risks, benefits and alternatives for the proposed anesthesia with the patient or authorized representative who has indicated his/her understanding and acceptance.   Dental advisory given  Plan Discussed with: CRNA and Surgeon  Anesthesia Plan Comments: (Plan routine monitors, GA- LMA OK)        Anesthesia Quick Evaluation

## 2016-09-09 NOTE — Anesthesia Procedure Notes (Signed)
Procedure Name: LMA Insertion Date/Time: 09/09/2016 7:33 AM Performed by: West Pugh Pre-anesthesia Checklist: Patient identified, Emergency Drugs available, Suction available, Patient being monitored and Timeout performed Patient Re-evaluated:Patient Re-evaluated prior to inductionOxygen Delivery Method: Circle system utilized Preoxygenation: Pre-oxygenation with 100% oxygen Intubation Type: IV induction Ventilation: Mask ventilation without difficulty LMA: LMA inserted LMA Size: 5.0 Placement Confirmation: positive ETCO2,  CO2 detector and breath sounds checked- equal and bilateral Tube secured with: Tape Dental Injury: Teeth and Oropharynx as per pre-operative assessment

## 2016-09-09 NOTE — Anesthesia Postprocedure Evaluation (Signed)
Anesthesia Post Note  Patient: [name]  Procedure(s) Performed: Procedure(s) (LRB): TRANSURETHRAL RESECTION OF THE PROSTATE (TURP) (N/A)  Patient location during evaluation: PACU Anesthesia Type: General Level of consciousness: awake and alert, oriented and patient cooperative Pain management: pain level controlled Vital Signs Assessment: post-procedure vital signs reviewed and stable Respiratory status: spontaneous breathing, nonlabored ventilation, respiratory function stable and patient connected to nasal cannula oxygen Cardiovascular status: blood pressure returned to baseline and stable Postop Assessment: no signs of nausea or vomiting Anesthetic complications: no    Last Vitals:  Vitals:   09/09/16 0939 09/09/16 1507  BP: 117/66 (!) 139/98  Pulse: (!) 52 (!) 50  Resp: 15 16  Temp: 36.7 C 36.3 C    Last Pain:  Vitals:   09/09/16 1507  TempSrc: Oral  PainSc:                  Kiora Hallberg,E. Maryelizabeth Eberle

## 2016-09-09 NOTE — Progress Notes (Signed)
Pt has CPAP from home and wishes to utilize tonight. RT inspected machine for damaged cords and frayed wires and none were present.  Machine is working properly at this time.  Pt prefers self placement but will notify RT if any assistance is required throughout the night.  RT to monitor and assess as needed.

## 2016-09-09 NOTE — Progress Notes (Signed)
Patient ID: [name], male   DOB: November 30, 1951, 65 y.o.   MRN: [id number] No complaints postop.   Urine light pink.  BP (!) 139/98 (BP Location: Right Arm)   Pulse (!) 50   Temp 97.3 F (36.3 C) (Oral)   Resp 16   Ht 5\' 9"  (1.753 m)   Wt 87.1 kg (192 lb)   SpO2 100%   BMI 28.35 kg/m    Will d/c foley in am and plan for discharge when voiding.  No discharge scripts.

## 2016-09-09 NOTE — Discharge Instructions (Signed)
Transurethral Resection of the Prostate, Care After Refer to this sheet in the next few weeks. These instructions provide you with information on caring for yourself after your procedure. Your caregiver also may give you specific instructions. Your treatment has been planned according to current medical practices, but complications sometimes occur. Call your caregiver if you have any problems or questions after your procedure. HOME CARE INSTRUCTIONS  Recovery can take 4-6 weeks. Avoid alcohol, caffeinated drinks, and spicy foods for 2 weeks after your procedure. Drink enough fluids to keep your urine clear or pale yellow. Urinate as soon as you feel the urge to do so. Do not try to hold your urine for long periods of time. During recovery you may experience pain caused by bladder spasms, which result in a very intense urge to urinate. Take all medicines as directed by your caregiver, including medicines for pain. Try to limit the amount of pain medicines you take because it can cause constipation. If you do become constipated, do not strain to move your bowels. Straining can increase bleeding. Constipation can be minimized by increasing the amount fluids and fiber in your diet. Your caregiver also may prescribe a stool softener. Do not lift heavy objects (more than 5 lb [2.25 kg]) or perform exercises that cause you to strain for at least 1 month after your procedure. When sitting, you may want to sit in a soft chair or use a cushion. For the first 10 days after your procedure, avoid the following activities:  Running.  Strenuous work.  Long walks.  Riding in a car for extended periods.  Sex. SEEK MEDICAL CARE IF:  You have difficulty urinating.  You have blood in your urine that does not go away after you rest or increase your fluid intake.  You have swelling in your penis or scrotum. SEEK IMMEDIATE MEDICAL CARE IF:   You are suddenly unable to urinate.  You notice blood clots in your  urine.  You have chills.  You have a fever.  You have pain in your back or lower abdomen.  You have pain or swelling in your legs. MAKE SURE YOU:   Understand these instructions.  Will watch your condition.  Will get help right away if you are not doing well or get worse.  Please get and over the counter stool softener and use daily.   Colace or metamucil is fine.    This information is not intended to replace advice given to you by your health care provider. Make sure you discuss any questions you have with your health care provider.   Document Released: 12/06/2005 Document Revised: 12/27/2014 Document Reviewed: 01/14/2012 Elsevier Interactive Patient Education [employer].

## 2016-09-10 DIAGNOSIS — N401 Enlarged prostate with lower urinary tract symptoms: Secondary | ICD-10-CM | POA: Diagnosis not present

## 2016-09-10 MED ORDER — ALPRAZOLAM 0.5 MG PO TABS
0.5000 mg | ORAL_TABLET | Freq: Once | ORAL | Status: AC
Start: 2016-09-10 — End: 2016-09-10
  Administered 2016-09-10: 0.5 mg via ORAL
  Filled 2016-09-10: qty 1

## 2016-09-10 NOTE — Discharge Summary (Signed)
Patient ID: VIOLA MICHALCZYK MRN: [id number] DOB/AGE: Oct 28, 1951 65 y.o.  Admit date: 09/09/2016 Discharge date: 09/10/2016  Primary Care Physician:  Rory Percy, MD  Discharge Diagnoses:   Present on Admission: . BPH (benign prostatic hypertrophy) with urinary obstruction   Consults: None   Discharge Medications:   Medication List    TAKE these medications   ALPRAZolam 0.5 MG tablet Commonly known as:  XANAX Take 0.25 mg by mouth at bedtime. For anxiety or sleep   brimonidine 0.2 % ophthalmic solution Commonly known as:  ALPHAGAN Place 1 drop into both eyes 2 (two) times daily.   buPROPion 150 MG 24 hr tablet Commonly known as:  WELLBUTRIN XL Take 450 mg by mouth daily.   COSOPT 22.3-6.8 MG/ML ophthalmic solution Generic drug:  dorzolamide-timolol Place 1 drop into both eyes 2 (two) times daily.   lamoTRIgine 200 MG tablet Commonly known as:  LAMICTAL Take 200 mg by mouth at bedtime.   latanoprost 0.005 % ophthalmic solution Commonly known as:  XALATAN Place 1 drop into both eyes at bedtime.   multivitamin with minerals Tabs tablet Take 1 tablet by mouth daily.   naltrexone 50 MG tablet Commonly known as:  DEPADE Take 25 mg by mouth 2 (two) times daily.   omeprazole 40 MG capsule Commonly known as:  PRILOSEC Take 40 mg by mouth 2 (two) times daily.        Significant Diagnostic Studies:  No results found.  Brief H and P: For complete details please refer to admission H and P, but in brief the pt is admitted for TURP for obstructive voiding sx's secondary to [place] [facility] Course:  Active Problems:   BPH (benign prostatic hypertrophy) with urinary obstruction   Day of Discharge BP 118/75 (BP Location: Left Arm)   Pulse 60   Temp 99.5 F (37.5 C) (Oral)   Resp 18   Ht 5\' 9"  (1.753 m)   Wt 87.1 kg (192 lb)   SpO2 98%   BMI 28.35 kg/m   Results for orders placed or performed during the hospital encounter of 09/09/16 (from the past 24  hour(s))  Glucose, capillary     Status: None   Collection Time: 09/09/16  9:09 AM  Result Value Ref Range   Glucose-Capillary 95 65 - 99 mg/dL   Comment 1 Notify RN    Comment 2 Document in Chart     Physical Exam: General: Alert and awake oriented x3 not in any acute distress. HEENT: anicteric sclera, pupils reactive to light and accommodation CVS: S1-S2 clear no murmur rubs or gallops Chest: clear to auscultation bilaterally, no wheezing rales or rhonchi Abdomen: soft nontender, nondistended, normal bowel sounds, no organomegaly Extremities: no cyanosis, clubbing or edema noted bilaterally Neuro: Cranial nerves II-XII intact, no focal neurological deficits  Disposition:  Home  Diet:  No restrictions  Activity:  Discussed w/ pt     TESTS THAT NEED FOLLOW-UP  Path review  DISCHARGE FOLLOW-UP Follow-up Information    Malka So, MD .   Specialty:  Urology Why:  follow up as scheduled in 2 weeks.  Contact information: [address] [place] [address] [phone]           Time spent on Discharge:  10 mins  Signed: Jorja Loa 09/10/2016, 6:10 AM

## 2016-09-10 NOTE — Care Management Note (Signed)
Case Management Note  Patient Details  Name: [name] MRN: [id number] Date of Birth: 09-28-1951  Subjective/Objective:                    Action/Plan:d/c home no needs or orders.   Expected Discharge Date:                  Expected Discharge Plan:  Home/Self Care  In-House Referral:     Discharge planning Services  CM Consult  Post Acute Care Choice:    Choice offered to:     DME Arranged:    DME Agency:     HH Arranged:    [place] Agency:     Status of Service:  Completed, signed off  If discussed at [employer] of Stay Meetings, dates discussed:    Additional Comments:  Dessa Phi, RN 09/10/2016, 12:04 PM

## 2016-09-13 ENCOUNTER — Encounter (HOSPITAL_COMMUNITY): Payer: Self-pay | Admitting: Urology

## 2016-09-14 ENCOUNTER — Ambulatory Visit (INDEPENDENT_AMBULATORY_CARE_PROVIDER_SITE_OTHER): Payer: BC Managed Care – PPO | Admitting: Urology

## 2016-09-14 DIAGNOSIS — R3911 Hesitancy of micturition: Secondary | ICD-10-CM | POA: Diagnosis not present

## 2016-09-15 ENCOUNTER — Other Ambulatory Visit (HOSPITAL_COMMUNITY)
Admission: RE | Admit: 2016-09-15 | Discharge: 2016-09-15 | Disposition: A | Discharge status: Home / Self Care | Payer: BC Managed Care – PPO | Source: Ambulatory Visit | Attending: Urology | Admitting: Urology

## 2016-09-15 DIAGNOSIS — R300 Dysuria: Secondary | ICD-10-CM | POA: Diagnosis not present

## 2016-09-15 LAB — URINE MICROSCOPIC-ADD ON

## 2016-09-15 LAB — URINALYSIS, ROUTINE W REFLEX MICROSCOPIC
Bilirubin Urine: NEGATIVE
GLUCOSE, UA: NEGATIVE mg/dL
KETONES UR: NEGATIVE mg/dL
Leukocytes, UA: NEGATIVE
Nitrite: NEGATIVE
PROTEIN: 100 mg/dL — AB
Specific Gravity, Urine: 1.015 (ref 1.005–1.030)
pH: 5.5 (ref 5.0–8.0)

## 2016-09-17 LAB — URINE CULTURE: CULTURE: NO GROWTH

## 2016-10-01 ENCOUNTER — Other Ambulatory Visit (HOSPITAL_COMMUNITY)
Admission: RE | Admit: 2016-10-01 | Discharge: 2016-10-01 | Disposition: A | Discharge status: Home / Self Care | Payer: BC Managed Care – PPO | Source: Ambulatory Visit | Attending: Urology | Admitting: Urology

## 2016-10-01 ENCOUNTER — Ambulatory Visit (INDEPENDENT_AMBULATORY_CARE_PROVIDER_SITE_OTHER): Payer: Self-pay | Admitting: Urology

## 2016-10-01 DIAGNOSIS — N401 Enlarged prostate with lower urinary tract symptoms: Secondary | ICD-10-CM

## 2016-10-01 DIAGNOSIS — R350 Frequency of micturition: Secondary | ICD-10-CM | POA: Insufficient documentation

## 2016-10-01 LAB — URINE MICROSCOPIC-ADD ON
Bacteria, UA: NONE SEEN
Squamous Epithelial / LPF: NONE SEEN

## 2016-10-01 LAB — URINALYSIS, ROUTINE W REFLEX MICROSCOPIC
BILIRUBIN URINE: NEGATIVE
Glucose, UA: NEGATIVE mg/dL
KETONES UR: NEGATIVE mg/dL
NITRITE: NEGATIVE
PROTEIN: 100 mg/dL — AB
Specific Gravity, Urine: 1.02 (ref 1.005–1.030)
pH: 5.5 (ref 5.0–8.0)

## 2016-10-03 LAB — URINE CULTURE: Culture: NO GROWTH

## 2016-10-04 ENCOUNTER — Telehealth: Payer: Self-pay | Admitting: Internal Medicine

## 2016-10-04 NOTE — Telephone Encounter (Signed)
Pt was calling to see if he could have his colonoscopy before the end of the year since he has met his deductible. Please call [phone]

## 2016-10-06 NOTE — Telephone Encounter (Signed)
I spoke to pt and he said he has lost 75 lbs ( but was trying to lose some weight). He has had a change in BM's, usually more diarrhea and recently more constipated. When he has a BM it feels like someone is jabbing him in his rectum. He has had prostate problems recently and had a TURP.  But spoke to physician about this problem and he did not think it was related to the prostate.  He has been scheduled to see Neil Crouch, PA on 10/27/2016 at 1:30 pm.  He has [place].

## 2016-10-06 NOTE — Telephone Encounter (Signed)
LMOM for a return call. ( He is on Recall for [place] for 05/2017) Last one done by Dr. Gala Romney on 04/2012 and he recommended the next in 5 years.

## 2016-10-13 ENCOUNTER — Ambulatory Visit: Payer: BC Managed Care – PPO | Admitting: Nurse Practitioner

## 2016-10-27 ENCOUNTER — Ambulatory Visit (INDEPENDENT_AMBULATORY_CARE_PROVIDER_SITE_OTHER): Payer: BC Managed Care – PPO | Admitting: Gastroenterology

## 2016-10-27 ENCOUNTER — Encounter: Payer: Self-pay | Admitting: Gastroenterology

## 2016-10-27 ENCOUNTER — Other Ambulatory Visit: Payer: Self-pay

## 2016-10-27 VITALS — BP 142/83 | HR 59 | Temp 98.1°F | Ht 69.0 in | Wt 195.6 lb

## 2016-10-27 DIAGNOSIS — K6289 Other specified diseases of anus and rectum: Secondary | ICD-10-CM | POA: Diagnosis not present

## 2016-10-27 DIAGNOSIS — R195 Other fecal abnormalities: Secondary | ICD-10-CM

## 2016-10-27 DIAGNOSIS — C189 Malignant neoplasm of colon, unspecified: Secondary | ICD-10-CM | POA: Diagnosis not present

## 2016-10-27 DIAGNOSIS — Z85038 Personal history of other malignant neoplasm of large intestine: Secondary | ICD-10-CM

## 2016-10-27 DIAGNOSIS — K760 Fatty (change of) liver, not elsewhere classified: Secondary | ICD-10-CM

## 2016-10-27 MED ORDER — SOD PICOSULFATE-MAG OX-CIT ACD 10-3.5-12 MG-GM-GM PO PACK: 1.0000 | PACK | ORAL | 0 refills | Status: DC

## 2016-10-27 NOTE — Assessment & Plan Note (Signed)
65 year old gentleman with rectal pain, change in bowel habits in the setting of dietary modifications, fiber supplements, recent TURP. History of stage II colon cancer remotely. Patient is due for his surveillance colonoscopy within 6 months. Given recent changes in concerns, would go ahead and pursue colonoscopy at this time. Augment conscious sedation with Phenergan 25 mg IV 30 minutes before the procedure. I have discussed the risks, alternatives, benefits with regards to but not limited to the risk of reaction to medication, bleeding, infection, perforation and the patient is agreeable to proceed. Written consent to be obtained.

## 2016-10-27 NOTE — Patient Instructions (Signed)
Colonoscopy as scheduled

## 2016-10-27 NOTE — Progress Notes (Signed)
Primary Care Physician:  Rory Percy, MD  Primary Gastroenterologist:  Garfield Cornea, MD   Chief Complaint  Patient presents with  . Colonoscopy    hx colon cancer, discomfort in rectal area    HPI:  [name] is a 65 y.o. male here for follow-up, change in bowel movements. History of stage II colon cancer with resection in 2007. Asked colonoscopy May 2013 for hematochezia. Noted to have friability and multiple 3-4 mm anastomotic ulcers.Also with pitted proximal colonic mucosa status post biopsy. Anastomotic ulcers felt to be ischemic in nature. Pitted colonic mucosa benign. Next colonoscopy planned for May 2018. Patient also has a history of abnormal LFTs and fatty liver. EGD November 2016 with 2-3 cm; so, hard mass within 8-9 mm area of central ulceration in the proximal esophagus at 20-23 cm from the incisors.Salmon-colored epithelium also noted suspicious for Barrett's. No esophagitis. Gastric biopsy with chronic active gastritis with H pylori, subsequently treated, esophagogastric junction biopsy gastric-type mucosa with mixed inflammation and lymphoid follicles. Esophageal mass biopsy benign, it was felt that biopsy may not be sufficient due to the approach. He subsequently underwent an EUS with Dr. Owens Loffler on 11/20/2015. At this time the EGD findings were entirely normal. EUS findings of the esophagus normal. He had a CT of the chest which was normal. Questionable mass related to GERD, edema resolved after starting PPI therapy?.  08/2016 TURP. Uneventful recovery. For the past month or so he's been having pain in the rectum. Feels like being stuck or jabbed in rectum, doesn't matter what position in. New symptoms. Not related to bowel function. Saw his urologist, Dr. Jeffie Pollock he felt like this was unrelated to his TURP. Down 60 pounds intentional, on program by Dr. Bethann Goo, added naltrexone. Increased protein. Metamucil daily. BM large quantity of stool. Solid stool, used to be more loose.  Some straining. No brbpr. No melena. Denies abdominal pain. Has seen Dr. Redmond Baseman, ENT for hoarseness. Increased omeprazole to twice a day. Has follow-up later this month.  Current Outpatient Prescriptions  Medication Sig Dispense Refill  . ALPRAZolam (XANAX) 0.5 MG tablet Take 0.25 mg by mouth at bedtime. For anxiety or sleep    . brimonidine (ALPHAGAN) 0.2 % ophthalmic solution Place 1 drop into both eyes 2 (two) times daily.     Marland Kitchen buPROPion (WELLBUTRIN XL) 150 MG 24 hr tablet Take 450 mg by mouth daily.    . dorzolamide-timolol (COSOPT) 22.3-6.8 MG/ML ophthalmic solution Place 1 drop into both eyes 2 (two) times daily.    Marland Kitchen lamoTRIgine (LAMICTAL) 200 MG tablet Take 200 mg by mouth at bedtime.     Marland Kitchen latanoprost (XALATAN) 0.005 % ophthalmic solution Place 1 drop into both eyes at bedtime.    . Multiple Vitamin (MULITIVITAMIN WITH MINERALS) TABS Take 1 tablet by mouth daily.    . naltrexone (DEPADE) 50 MG tablet Take 25 mg by mouth 2 (two) times daily.    Marland Kitchen omeprazole (PRILOSEC) 40 MG capsule Take 40 mg by mouth 2 (two) times daily.     No current facility-administered medications for this visit.     Allergies as of 10/27/2016 - Review Complete 10/27/2016  Allergen Reaction Noted  . Bactrim [sulfamethoxazole-trimethoprim] Hives and Swelling 09/08/2016  . Dilaudid [hydromorphone hcl] Other (See Comments) 09/08/2016  . Morphine and related Nausea And Vomiting 09/08/2016    Past Medical History:  Diagnosis Date  . Anemia    hx of   . Arthritis    fingers   . BPH (  benign prostatic hyperplasia)   . Colon cancer ([place])    stage II, diagnosed 2007  . Complication of anesthesia    vagal response after umbilcal hernia at [facility]  . Depression   . Diabetes mellitus    no as of 09/08/16 due to weight loss   . GERD (gastroesophageal reflux disease)   . Glaucoma 2012  . Sleep apnea    wears CiPaP at night  . Staph infection 2008   left side of face    Past Surgical History:  Procedure  Laterality Date  . APPENDECTOMY    . CARDIAC CATHETERIZATION    . COLECTOMY  2007   right hemicolectomy  . COLONOSCOPY  05/11/10   normal rectum,normal residual colon with some angry appearring anastomtic mucosa with some erosions and oozing, bx unremarkable  . COLONOSCOPY  05/16/2012   Dr. Gala Romney- normal rectum, sigmoid diverticulosis, anastomotic ulcers. bx= acute ulcer and benign colonic mucosa with intramucosal lympoid aggregates  . ESOPHAGEAL DILATION N/A 10/28/2015   Procedure: ESOPHAGEAL DILATION;  Surgeon: Daneil Dolin, MD;  Location: [facility];  Service: Endoscopy;  Laterality: N/A;  . ESOPHAGOGASTRODUODENOSCOPY N/A 10/28/2015   RMR: ulcerated proximal esophageal mass ad described status post biopsy. Abnormal distal esophagus biopsy. status post biopsy. Hiatal hernia . abnormal gastric mucosa doubt clinical significance status post biopsy  . EUS N/A 11/20/2015   Procedure: UPPER ENDOSCOPIC ULTRASOUND (EUS) LINEAR;  Surgeon: Milus Banister, MD;  Location: [facility];  Service: Endoscopy;  Laterality: N/A;  . HEMORRHOID SURGERY  12/31/2011   Procedure: HEMORRHOIDECTOMY;  Surgeon: Jamesetta So;  Location: [facility];  Service: General;  Laterality: N/A;  . HERNIA REPAIR     umbilical  . KIDNEY STONE SURGERY    . PILONIDAL CYST / SINUS EXCISION  1976  . SKIN LESION EXCISION     left shoulder/pre cancerous  . TRANSURETHRAL RESECTION OF PROSTATE N/A 09/09/2016   Procedure: TRANSURETHRAL RESECTION OF THE PROSTATE (TURP);  Surgeon: Irine Seal, MD;  Location: [facility];  Service: Urology;  Laterality: N/A;    Family History  Problem Relation Age of Onset  . Lung cancer Father   . Diabetes Mother   . Anesthesia problems Neg Hx   . Hypotension Neg Hx   . Malignant hyperthermia Neg Hx   . Pseudochol deficiency Neg Hx     Social History   Social History  . Marital status: Married    Spouse name: N/A  . Number of children: 1  . Years of education: N/A   Occupational History  .  school system [employer]   Social History Main Topics  . Smoking status: Never Smoker  . Smokeless tobacco: Never Used     Comment: Never smoked  . Alcohol use No  . Drug use: No  . Sexual activity: Not on file   Other Topics Concern  . Not on file   Social History Narrative  . No narrative on file      ROS:  General: Negative for anorexia, weight loss, fever, chills, fatigue, weakness. Eyes: Negative for vision changes.  ENT: Negative for hoarseness, difficulty swallowing , nasal congestion. CV: Negative for chest pain, angina, palpitations, dyspnea on exertion, peripheral edema.  Respiratory: Negative for dyspnea at rest, dyspnea on exertion, cough, sputum, wheezing.  GI: See history of present illness. GU:  Negative for dysuria, hematuria, urinary incontinence, urinary frequency, nocturnal urination.  MS: Negative for joint pain, low back pain.  Derm: Negative for rash or itching.  Neuro: Negative for weakness, abnormal sensation, seizure, frequent headaches, memory loss, confusion.  Psych: Negative for anxiety, depression, suicidal ideation, hallucinations.  Endo: Negative for unusual weight change.  Heme: Negative for bruising or bleeding. Allergy: Negative for rash or hives.    Physical Examination:  BP (!) 142/83   Pulse (!) 59   Temp 98.1 F (36.7 C) (Oral)   Ht 5\' 9"  (1.753 m)   Wt 195 lb 9.6 oz (88.7 kg)   BMI 28.89 kg/m    General: Well-nourished, well-developed in no acute distress.  Head: Normocephalic, atraumatic.   Eyes: Conjunctiva pink, no icterus. Mouth: Oropharyngeal mucosa moist and pink , no lesions erythema or exudate. Neck: Supple without thyromegaly, masses, or lymphadenopathy.  Lungs: Clear to auscultation bilaterally.  Heart: Regular rate and rhythm, no murmurs rubs or gallops.  Abdomen: Bowel sounds are normal, nontender, nondistended, no hepatosplenomegaly or masses, no abdominal bruits or    hernia , no rebound or guarding.    Rectal: Deferred Extremities: No lower extremity edema. No clubbing or deformities.  Neuro: Alert and oriented x 4 , grossly normal neurologically.  Skin: Warm and dry, no rash or jaundice.   Psych: Alert and cooperative, normal mood and affect.  Labs: Lab Results  Component Value Date   WBC 9.1 12/14/2014   HGB 14.8 12/14/2014   HCT 43.9 12/14/2014   MCV 89.0 12/14/2014   PLT 205 12/14/2014    Lab Results  Component Value Date   ALT 39 12/14/2014   AST 26 12/14/2014   ALKPHOS 110 12/14/2014   BILITOT 0.3 12/14/2014     Imaging Studies: No results found.

## 2016-10-27 NOTE — Progress Notes (Signed)
cc'ed to pcp °

## 2016-10-27 NOTE — Assessment & Plan Note (Signed)
Intentional weight loss of 60 pounds. Patient reports his last LFTs by PCP were normal. Congratulated him on his weight loss.

## 2016-11-17 ENCOUNTER — Ambulatory Visit (HOSPITAL_COMMUNITY)
Admission: RE | Admit: 2016-11-17 | Discharge: 2016-11-17 | Disposition: A | Discharge status: Home / Self Care | Payer: BC Managed Care – PPO | Source: Ambulatory Visit | Attending: Internal Medicine | Admitting: Internal Medicine

## 2016-11-17 ENCOUNTER — Encounter (HOSPITAL_COMMUNITY): Payer: Self-pay | Admitting: *Deleted

## 2016-11-17 ENCOUNTER — Encounter (HOSPITAL_COMMUNITY)
Admission: RE | Disposition: A | Discharge status: Home / Self Care | Payer: Self-pay | Source: Ambulatory Visit | Attending: Internal Medicine

## 2016-11-17 DIAGNOSIS — K219 Gastro-esophageal reflux disease without esophagitis: Secondary | ICD-10-CM | POA: Diagnosis not present

## 2016-11-17 DIAGNOSIS — E119 Type 2 diabetes mellitus without complications: Secondary | ICD-10-CM | POA: Insufficient documentation

## 2016-11-17 DIAGNOSIS — K633 Ulcer of intestine: Secondary | ICD-10-CM | POA: Diagnosis not present

## 2016-11-17 DIAGNOSIS — Z85038 Personal history of other malignant neoplasm of large intestine: Secondary | ICD-10-CM | POA: Diagnosis not present

## 2016-11-17 DIAGNOSIS — N400 Enlarged prostate without lower urinary tract symptoms: Secondary | ICD-10-CM | POA: Insufficient documentation

## 2016-11-17 DIAGNOSIS — Z8711 Personal history of peptic ulcer disease: Secondary | ICD-10-CM | POA: Insufficient documentation

## 2016-11-17 DIAGNOSIS — Z1211 Encounter for screening for malignant neoplasm of colon: Secondary | ICD-10-CM | POA: Diagnosis present

## 2016-11-17 DIAGNOSIS — G4730 Sleep apnea, unspecified: Secondary | ICD-10-CM | POA: Insufficient documentation

## 2016-11-17 HISTORY — PX: COLONOSCOPY: SHX5424

## 2016-11-17 SURGERY — COLONOSCOPY
Anesthesia: Moderate Sedation

## 2016-11-17 MED ORDER — MIDAZOLAM HCL 5 MG/5ML IJ SOLN
INTRAMUSCULAR | Status: DC | PRN
  Administered 2016-11-17: 1 mg via INTRAVENOUS
  Administered 2016-11-17 (×2): 2 mg via INTRAVENOUS

## 2016-11-17 MED ORDER — PROMETHAZINE HCL 25 MG/ML IJ SOLN
25.0000 mg | Freq: Once | INTRAMUSCULAR | Status: AC
  Administered 2016-11-17: 25 mg via INTRAVENOUS

## 2016-11-17 MED ORDER — MEPERIDINE HCL 100 MG/ML IJ SOLN
INTRAMUSCULAR | Status: AC
  Filled 2016-11-17: qty 2

## 2016-11-17 MED ORDER — MEPERIDINE HCL 100 MG/ML IJ SOLN
INTRAMUSCULAR | Status: DC | PRN
  Administered 2016-11-17: 25 mg via INTRAVENOUS
  Administered 2016-11-17: 50 mg via INTRAVENOUS

## 2016-11-17 MED ORDER — ONDANSETRON HCL 4 MG/2ML IJ SOLN
INTRAMUSCULAR | Status: DC | PRN
  Administered 2016-11-17: 4 mg via INTRAVENOUS

## 2016-11-17 MED ORDER — MIDAZOLAM HCL 5 MG/5ML IJ SOLN
INTRAMUSCULAR | Status: AC
  Filled 2016-11-17: qty 10

## 2016-11-17 MED ORDER — SODIUM CHLORIDE 0.9% FLUSH
INTRAVENOUS | Status: AC
  Filled 2016-11-17: qty 10

## 2016-11-17 MED ORDER — PROMETHAZINE HCL 25 MG/ML IJ SOLN
INTRAMUSCULAR | Status: AC
  Filled 2016-11-17: qty 1

## 2016-11-17 MED ORDER — ONDANSETRON HCL 4 MG/2ML IJ SOLN
INTRAMUSCULAR | Status: AC
  Filled 2016-11-17: qty 2

## 2016-11-17 MED ORDER — SODIUM CHLORIDE 0.9 % IV SOLN
INTRAVENOUS | Status: DC
  Administered 2016-11-17: 12:00:00 via INTRAVENOUS

## 2016-11-17 NOTE — Interval H&P Note (Signed)
History and Physical Interval Note:  11/17/2016 12:30 PM  [name]  has presented today for surgery, with the diagnosis of history of colon cancer  The various methods of treatment have been discussed with the patient and family. After consideration of risks, benefits and other options for treatment, the patient has consented to  Procedure(s) with comments: COLONOSCOPY (N/A) - 100 as a surgical intervention .  The patient's history has been reviewed, patient examined, no change in status, stable for surgery.  I have reviewed the patient's chart and labs.  Questions were answered to the patient's satisfaction.     Garnell Phenix  No change.  test per plan.  The risks, benefits, limitations, alternatives and imponderables have been reviewed with the patient. Questions have been answered. All parties are agreeable.

## 2016-11-17 NOTE — Discharge Instructions (Addendum)
°  Colonoscopy Discharge Instructions  Read the instructions outlined below and refer to this sheet in the next few weeks. These discharge instructions provide you with general information on caring for yourself after you leave the hospital. Your doctor may also give you specific instructions. While your treatment has been planned according to the most current medical practices available, unavoidable complications occasionally occur. If you have any problems or questions after discharge, call Dr. Gala Romney at [phone]. ACTIVITY  You may resume your regular activity, but move at a slower pace for the next 24 hours.   Take frequent rest periods for the next 24 hours.   Walking will help get rid of the air and reduce the bloated feeling in your belly (abdomen).   No driving for 24 hours (because of the medicine (anesthesia) used during the test).    Do not sign any important legal documents or operate any machinery for 24 hours (because of the anesthesia used during the test).  NUTRITION  Drink plenty of fluids.   You may resume your normal diet as instructed by your doctor.   Begin with a light meal and progress to your normal diet. Heavy or fried foods are harder to digest and may make you feel sick to your stomach (nauseated).   Avoid alcoholic beverages for 24 hours or as instructed.  MEDICATIONS  You may resume your normal medications unless your doctor tells you otherwise.  WHAT YOU CAN EXPECT TODAY  Some feelings of bloating in the abdomen.   Passage of more gas than usual.   Spotting of blood in your stool or on the toilet paper.  IF YOU HAD POLYPS REMOVED DURING THE COLONOSCOPY:  No aspirin products for 7 days or as instructed.   No alcohol for 7 days or as instructed.   Eat a soft diet for the next 24 hours.  FINDING OUT THE RESULTS OF YOUR TEST Not all test results are available during your visit. If your test results are not back during the visit, make an appointment  with your caregiver to find out the results. Do not assume everything is normal if you have not heard from your caregiver or the medical facility. It is important for you to follow up on all of your test results.  SEEK IMMEDIATE MEDICAL ATTENTION IF:  You have more than a spotting of blood in your stool.   Your belly is swollen (abdominal distention).   You are nauseated or vomiting.   You have a temperature over 101.   You have abdominal pain or discomfort that is severe or gets worse throughout the day.     Further recommendations to follow pending review of pathology report  Would minimize use of aspirin/nonsteroidal agents like Advil and  Aleve

## 2016-11-17 NOTE — H&P (View-Only) (Signed)
Primary Care Physician:  Rory Percy, MD  Primary Gastroenterologist:  Garfield Cornea, MD   Chief Complaint  Patient presents with  . Colonoscopy    hx colon cancer, discomfort in rectal area    HPI:  [name] is a 65 y.o. male here for follow-up, change in bowel movements. History of stage II colon cancer with resection in 2007. Asked colonoscopy May 2013 for hematochezia. Noted to have friability and multiple 3-4 mm anastomotic ulcers.Also with pitted proximal colonic mucosa status post biopsy. Anastomotic ulcers felt to be ischemic in nature. Pitted colonic mucosa benign. Next colonoscopy planned for May 2018. Patient also has a history of abnormal LFTs and fatty liver. EGD November 2016 with 2-3 cm; so, hard mass within 8-9 mm area of central ulceration in the proximal esophagus at 20-23 cm from the incisors.Salmon-colored epithelium also noted suspicious for Barrett's. No esophagitis. Gastric biopsy with chronic active gastritis with H pylori, subsequently treated, esophagogastric junction biopsy gastric-type mucosa with mixed inflammation and lymphoid follicles. Esophageal mass biopsy benign, it was felt that biopsy may not be sufficient due to the approach. He subsequently underwent an EUS with Dr. Owens Loffler on 11/20/2015. At this time the EGD findings were entirely normal. EUS findings of the esophagus normal. He had a CT of the chest which was normal. Questionable mass related to GERD, edema resolved after starting PPI therapy?.  08/2016 TURP. Uneventful recovery. For the past month or so he's been having pain in the rectum. Feels like being stuck or jabbed in rectum, doesn't matter what position in. New symptoms. Not related to bowel function. Saw his urologist, Dr. Jeffie Pollock he felt like this was unrelated to his TURP. Down 60 pounds intentional, on program by Dr. Bethann Goo, added naltrexone. Increased protein. Metamucil daily. BM large quantity of stool. Solid stool, used to be more loose.  Some straining. No brbpr. No melena. Denies abdominal pain. Has seen Dr. Redmond Baseman, ENT for hoarseness. Increased omeprazole to twice a day. Has follow-up later this month.  Current Outpatient Prescriptions  Medication Sig Dispense Refill  . ALPRAZolam (XANAX) 0.5 MG tablet Take 0.25 mg by mouth at bedtime. For anxiety or sleep    . brimonidine (ALPHAGAN) 0.2 % ophthalmic solution Place 1 drop into both eyes 2 (two) times daily.     Marland Kitchen buPROPion (WELLBUTRIN XL) 150 MG 24 hr tablet Take 450 mg by mouth daily.    . dorzolamide-timolol (COSOPT) 22.3-6.8 MG/ML ophthalmic solution Place 1 drop into both eyes 2 (two) times daily.    Marland Kitchen lamoTRIgine (LAMICTAL) 200 MG tablet Take 200 mg by mouth at bedtime.     Marland Kitchen latanoprost (XALATAN) 0.005 % ophthalmic solution Place 1 drop into both eyes at bedtime.    . Multiple Vitamin (MULITIVITAMIN WITH MINERALS) TABS Take 1 tablet by mouth daily.    . naltrexone (DEPADE) 50 MG tablet Take 25 mg by mouth 2 (two) times daily.    Marland Kitchen omeprazole (PRILOSEC) 40 MG capsule Take 40 mg by mouth 2 (two) times daily.     No current facility-administered medications for this visit.     Allergies as of 10/27/2016 - Review Complete 10/27/2016  Allergen Reaction Noted  . Bactrim [sulfamethoxazole-trimethoprim] Hives and Swelling 09/08/2016  . Dilaudid [hydromorphone hcl] Other (See Comments) 09/08/2016  . Morphine and related Nausea And Vomiting 09/08/2016    Past Medical History:  Diagnosis Date  . Anemia    hx of   . Arthritis    fingers   . BPH (  benign prostatic hyperplasia)   . Colon cancer ([place])    stage II, diagnosed 2007  . Complication of anesthesia    vagal response after umbilcal hernia at [facility]  . Depression   . Diabetes mellitus    no as of 09/08/16 due to weight loss   . GERD (gastroesophageal reflux disease)   . Glaucoma 2012  . Sleep apnea    wears CiPaP at night  . Staph infection 2008   left side of face    Past Surgical History:  Procedure  Laterality Date  . APPENDECTOMY    . CARDIAC CATHETERIZATION    . COLECTOMY  2007   right hemicolectomy  . COLONOSCOPY  05/11/10   normal rectum,normal residual colon with some angry appearring anastomtic mucosa with some erosions and oozing, bx unremarkable  . COLONOSCOPY  05/16/2012   Dr. Gala Romney- normal rectum, sigmoid diverticulosis, anastomotic ulcers. bx= acute ulcer and benign colonic mucosa with intramucosal lympoid aggregates  . ESOPHAGEAL DILATION N/A 10/28/2015   Procedure: ESOPHAGEAL DILATION;  Surgeon: Daneil Dolin, MD;  Location: [facility];  Service: Endoscopy;  Laterality: N/A;  . ESOPHAGOGASTRODUODENOSCOPY N/A 10/28/2015   RMR: ulcerated proximal esophageal mass ad described status post biopsy. Abnormal distal esophagus biopsy. status post biopsy. Hiatal hernia . abnormal gastric mucosa doubt clinical significance status post biopsy  . EUS N/A 11/20/2015   Procedure: UPPER ENDOSCOPIC ULTRASOUND (EUS) LINEAR;  Surgeon: Milus Banister, MD;  Location: [facility];  Service: Endoscopy;  Laterality: N/A;  . HEMORRHOID SURGERY  12/31/2011   Procedure: HEMORRHOIDECTOMY;  Surgeon: Jamesetta So;  Location: [facility];  Service: General;  Laterality: N/A;  . HERNIA REPAIR     umbilical  . KIDNEY STONE SURGERY    . PILONIDAL CYST / SINUS EXCISION  1976  . SKIN LESION EXCISION     left shoulder/pre cancerous  . TRANSURETHRAL RESECTION OF PROSTATE N/A 09/09/2016   Procedure: TRANSURETHRAL RESECTION OF THE PROSTATE (TURP);  Surgeon: Irine Seal, MD;  Location: [facility];  Service: Urology;  Laterality: N/A;    Family History  Problem Relation Age of Onset  . Lung cancer Father   . Diabetes Mother   . Anesthesia problems Neg Hx   . Hypotension Neg Hx   . Malignant hyperthermia Neg Hx   . Pseudochol deficiency Neg Hx     Social History   Social History  . Marital status: Married    Spouse name: N/A  . Number of children: 1  . Years of education: N/A   Occupational History  .  school system [employer]   Social History Main Topics  . Smoking status: Never Smoker  . Smokeless tobacco: Never Used     Comment: Never smoked  . Alcohol use No  . Drug use: No  . Sexual activity: Not on file   Other Topics Concern  . Not on file   Social History Narrative  . No narrative on file      ROS:  General: Negative for anorexia, weight loss, fever, chills, fatigue, weakness. Eyes: Negative for vision changes.  ENT: Negative for hoarseness, difficulty swallowing , nasal congestion. CV: Negative for chest pain, angina, palpitations, dyspnea on exertion, peripheral edema.  Respiratory: Negative for dyspnea at rest, dyspnea on exertion, cough, sputum, wheezing.  GI: See history of present illness. GU:  Negative for dysuria, hematuria, urinary incontinence, urinary frequency, nocturnal urination.  MS: Negative for joint pain, low back pain.  Derm: Negative for rash or itching.  Neuro: Negative for weakness, abnormal sensation, seizure, frequent headaches, memory loss, confusion.  Psych: Negative for anxiety, depression, suicidal ideation, hallucinations.  Endo: Negative for unusual weight change.  Heme: Negative for bruising or bleeding. Allergy: Negative for rash or hives.    Physical Examination:  BP (!) 142/83   Pulse (!) 59   Temp 98.1 F (36.7 C) (Oral)   Ht 5\' 9"  (1.753 m)   Wt 195 lb 9.6 oz (88.7 kg)   BMI 28.89 kg/m    General: Well-nourished, well-developed in no acute distress.  Head: Normocephalic, atraumatic.   Eyes: Conjunctiva pink, no icterus. Mouth: Oropharyngeal mucosa moist and pink , no lesions erythema or exudate. Neck: Supple without thyromegaly, masses, or lymphadenopathy.  Lungs: Clear to auscultation bilaterally.  Heart: Regular rate and rhythm, no murmurs rubs or gallops.  Abdomen: Bowel sounds are normal, nontender, nondistended, no hepatosplenomegaly or masses, no abdominal bruits or    hernia , no rebound or guarding.    Rectal: Deferred Extremities: No lower extremity edema. No clubbing or deformities.  Neuro: Alert and oriented x 4 , grossly normal neurologically.  Skin: Warm and dry, no rash or jaundice.   Psych: Alert and cooperative, normal mood and affect.  Labs: Lab Results  Component Value Date   WBC 9.1 12/14/2014   HGB 14.8 12/14/2014   HCT 43.9 12/14/2014   MCV 89.0 12/14/2014   PLT 205 12/14/2014    Lab Results  Component Value Date   ALT 39 12/14/2014   AST 26 12/14/2014   ALKPHOS 110 12/14/2014   BILITOT 0.3 12/14/2014     Imaging Studies: No results found.

## 2016-11-17 NOTE — Op Note (Signed)
[facility] Patient Name: [name] Procedure Date: 11/17/2016 12:21 PM MRN: [id number] Date of Birth: 07-20-1951 Attending MD: Norvel Richards , MD CSN: [id number] Age: 65 Admit Type: Outpatient Procedure:                Colonoscopy with biopsy Indications:              High risk colon cancer surveillance: Personal                            history of colon cancer Providers:                Norvel Richards, MD, Janeece Riggers, RN, Randa Spike, Technician Referring MD:              Medicines:                Midazolam 5 mg IV, Meperidine 75 mg IV, Ondansetron                            4 mg IV Complications:            No immediate complications. Estimated Blood Loss:     Estimated blood loss was minimal. Procedure:                Pre-Anesthesia Assessment:                           - Prior to the procedure, a History and Physical                            was performed, and patient medications and                            allergies were reviewed. The patient's tolerance of                            previous anesthesia was also reviewed. The risks                            and benefits of the procedure and the sedation                            options and risks were discussed with the patient.                            All questions were answered, and informed consent                            was obtained. Prior Anticoagulants: The patient has                            taken no previous anticoagulant or antiplatelet  agents. ASA Grade Assessment: II - A patient with                            mild systemic disease. After reviewing the risks                            and benefits, the patient was deemed in                            satisfactory condition to undergo the procedure.                           After obtaining informed consent, the colonoscope                            was passed under  direct vision. Throughout the                            procedure, the patient's blood pressure, pulse, and                            oxygen saturations were monitored continuously. The                            EC-[id number] scope was introduced through                            the anus and advanced to the the ileocolonic                            anastomosis. The colonoscopy was performed without                            difficulty. The patient tolerated the procedure                            well. The quality of the bowel preparation was                            adequate. The ileocecal valve, appendiceal orifice,                            and rectum were photographed. Scope In: 12:40:05 PM Scope Out: [id number] PM Scope Withdrawal Time: 0 hours 12 minutes 9 seconds  Total Procedure Duration: 0 hours 18 minutes 13 seconds  Findings:      Ulcerated mucosa were present at the anastomosis. This was biopsied with       a cold forceps for histology. Estimated blood loss was minimal.      The exam was otherwise without abnormality on direct and retroflexion       views. Impression:               - Mucosal ulceration. Biopsied.                           -  The examination was otherwise normal on direct                            and retroflexion views. Moderate Sedation:      Moderate (conscious) sedation was administered by the endoscopy nurse       and supervised by the endoscopist. The following parameters were       monitored: oxygen saturation, heart rate, blood pressure, respiratory       rate, EKG, adequacy of pulmonary ventilation, and response to care.       Total physician intraservice time was 25 minutes. Recommendation:           - Patient has a contact number available for                            emergencies. The signs and symptoms of potential                            delayed complications were discussed with the                            patient. Return  to normal activities tomorrow.                            Written discharge instructions were provided to the                            patient.                           - Resume previous diet.                           - Continue present medications.                           - Repeat colonoscopy date to be determined after                            pending pathology results are reviewed for                            surveillance based on pathology results.                           - Return to GI office after studies are complete. Procedure Code(s):        --- Professional ---                           [phone], Colonoscopy, flexible; with biopsy, single                            or multiple                           [id number], Moderate sedation services provided by the  same physician or other qualified health care                            professional performing the diagnostic or                            therapeutic service that the sedation supports,                            requiring the presence of an independent trained                            observer to assist in the monitoring of the                            patient's level of consciousness and physiological                            status; initial 15 minutes of intraservice time,                            patient age 69 years or older                           [phone], Moderate sedation services; each additional                            15 minutes intraservice time Diagnosis Code(s):        --- Professional ---                           [phone], Personal history of other malignant                            neoplasm of large intestine CPT copyright 2016 American Medical Association. All rights reserved. The codes documented in this report are preliminary and upon coder review may  be revised to meet current compliance requirements. Cristopher Estimable. Ivanka Kirshner, MD Norvel Richards, MD 11/17/2016  1:09:58 PM This report has been signed electronically. Number of Addenda: 0

## 2016-11-18 ENCOUNTER — Encounter: Payer: Self-pay | Admitting: Internal Medicine

## 2016-11-22 ENCOUNTER — Encounter (HOSPITAL_COMMUNITY): Payer: Self-pay | Admitting: Internal Medicine

## 2017-02-25 ENCOUNTER — Emergency Department (HOSPITAL_COMMUNITY)
Admission: EM | Admit: 2017-02-25 | Discharge: 2017-02-25 | Disposition: A | Discharge status: Home / Self Care | Payer: BC Managed Care – PPO | Attending: Emergency Medicine | Admitting: Emergency Medicine

## 2017-02-25 ENCOUNTER — Emergency Department (HOSPITAL_COMMUNITY): Payer: BC Managed Care – PPO

## 2017-02-25 ENCOUNTER — Encounter (HOSPITAL_COMMUNITY): Payer: Self-pay

## 2017-02-25 DIAGNOSIS — R1032 Left lower quadrant pain: Secondary | ICD-10-CM | POA: Insufficient documentation

## 2017-02-25 DIAGNOSIS — R52 Pain, unspecified: Secondary | ICD-10-CM

## 2017-02-25 DIAGNOSIS — Z8546 Personal history of malignant neoplasm of prostate: Secondary | ICD-10-CM | POA: Diagnosis not present

## 2017-02-25 DIAGNOSIS — R110 Nausea: Secondary | ICD-10-CM | POA: Diagnosis not present

## 2017-02-25 DIAGNOSIS — Z79899 Other long term (current) drug therapy: Secondary | ICD-10-CM | POA: Diagnosis not present

## 2017-02-25 DIAGNOSIS — R1030 Lower abdominal pain, unspecified: Secondary | ICD-10-CM

## 2017-02-25 DIAGNOSIS — E119 Type 2 diabetes mellitus without complications: Secondary | ICD-10-CM | POA: Insufficient documentation

## 2017-02-25 LAB — CBC WITH DIFFERENTIAL/PLATELET
BASOS PCT: 0 %
Basophils Absolute: 0 10*3/uL (ref 0.0–0.1)
EOS ABS: 0.1 10*3/uL (ref 0.0–0.7)
EOS PCT: 2 %
HCT: 44.4 % (ref 39.0–52.0)
Hemoglobin: 15.9 g/dL (ref 13.0–17.0)
LYMPHS ABS: 1.3 10*3/uL (ref 0.7–4.0)
Lymphocytes Relative: 20 %
MCH: 30.9 pg (ref 26.0–34.0)
MCHC: 35.8 g/dL (ref 30.0–36.0)
MCV: 86.2 fL (ref 78.0–100.0)
MONO ABS: 0.7 10*3/uL (ref 0.1–1.0)
MONOS PCT: 10 %
NEUTROS PCT: 68 %
Neutro Abs: 4.4 10*3/uL (ref 1.7–7.7)
PLATELETS: 185 10*3/uL (ref 150–400)
RBC: 5.15 MIL/uL (ref 4.22–5.81)
RDW: 13.4 % (ref 11.5–15.5)
WBC: 6.5 10*3/uL (ref 4.0–10.5)

## 2017-02-25 LAB — URINALYSIS, ROUTINE W REFLEX MICROSCOPIC
Bilirubin Urine: NEGATIVE
Glucose, UA: NEGATIVE mg/dL
Hgb urine dipstick: NEGATIVE
KETONES UR: NEGATIVE mg/dL
LEUKOCYTES UA: NEGATIVE
NITRITE: NEGATIVE
PH: 5 (ref 5.0–8.0)
PROTEIN: NEGATIVE mg/dL
Specific Gravity, Urine: 1.025 (ref 1.005–1.030)

## 2017-02-25 LAB — COMPREHENSIVE METABOLIC PANEL
ALBUMIN: 4.1 g/dL (ref 3.5–5.0)
ALK PHOS: 91 U/L (ref 38–126)
ALT: 33 U/L (ref 17–63)
ANION GAP: 8 (ref 5–15)
AST: 32 U/L (ref 15–41)
BUN: 21 mg/dL — ABNORMAL HIGH (ref 6–20)
CALCIUM: 9.2 mg/dL (ref 8.9–10.3)
CO2: 24 mmol/L (ref 22–32)
Chloride: 106 mmol/L (ref 101–111)
Creatinine, Ser: 0.83 mg/dL (ref 0.61–1.24)
GFR calc non Af Amer: >60 mL/min (ref >60)
GLUCOSE: 111 mg/dL — AB (ref 65–99)
POTASSIUM: 4 mmol/L (ref 3.5–5.1)
SODIUM: 138 mmol/L (ref 135–145)
Total Bilirubin: 0.5 mg/dL (ref 0.3–1.2)
Total Protein: 7.3 g/dL (ref 6.5–8.1)

## 2017-02-25 NOTE — Discharge Instructions (Signed)
Follow-up with your doctor if any problems 

## 2017-02-25 NOTE — ED Triage Notes (Signed)
LLQ abdominal pain that started this morning while sitting down at 1100 this morning. Regular BM this morning per pt. States pain is better now just reports of nausea. Fowl odor noted to urine a few days ago.

## 2017-02-25 NOTE — ED Provider Notes (Signed)
[facility] DEPT Provider Note   CSN: [id number] Arrival date & time: 02/25/17  1317  By signing my name below, I, Jeanell Sparrow, attest that this documentation has been prepared under the direction and in the presence of Milton Ferguson, MD. Electronically Signed: Jeanell Sparrow, Scribe. 02/25/2017. 1:34 PM.  History   Chief Complaint Chief Complaint  Patient presents with  . Abdominal Pain   The history is provided by the patient and medical records. No language interpreter was used.  Abdominal Pain   This is a new problem. The current episode started 3 to 5 hours ago. The problem occurs hourly. The problem has been gradually worsening. The pain is located in the LLQ. The pain is moderate. Associated symptoms include nausea. Pertinent negatives include diarrhea, vomiting, frequency, hematuria and headaches. Nothing aggravates the symptoms. Nothing relieves the symptoms. His past medical history is significant for GERD.    HPI Comments: [name] is a 66 y.o. male who presents to the Emergency Department complaining of intermittent moderate LLQ abdominal pain that started this morning.  states he has had a colectomy for his colon cancer. He had a colonoscopy that showed no abnormalities on 11/17/16. He describes the worsening pain as a sharp sensation. He reports associated nausea. He admits to a prior hx of kidney stones. He denies any fever, chills, vomiting, or other complaints.     PCP: Rory Percy, MD  Past Medical History:  Diagnosis Date  . Anemia    hx of   . Arthritis    fingers   . BPH (benign prostatic hyperplasia)   . Colon cancer ([place])    stage II, diagnosed 2007  . Complication of anesthesia    vagal response after umbilcal hernia at [facility]  . Depression   . Diabetes mellitus    no as of 09/08/16 due to weight loss   . GERD (gastroesophageal reflux disease)   . Glaucoma 2012  . Sleep apnea    wears CiPaP at night  . Staph infection 2008   left side of  face    Patient Active Problem List   Diagnosis Date Noted  . Rectal pain 10/27/2016  . BPH (benign prostatic hypertrophy) with urinary obstruction 09/09/2016  . Helicobacter pylori gastritis 02/23/2016  . Esophageal mass   . Mucosal abnormality of stomach   . GERD (gastroesophageal reflux disease) 10/20/2015  . Abnormal liver function tests 10/20/2015  . Esophageal dysphagia 10/20/2015  . Fatty liver 10/20/2015  . Chest pain 12/21/2012  . Type 2 diabetes mellitus ([place]) 12/21/2012  . Change in stool 04/25/2012  . Musculoskeletal pain 04/20/2012  . Strain of hip adductor muscle 04/20/2012  . Hip pain 04/20/2012  . GASTROESOPHAGEAL REFLUX DISEASE, HX OF 04/20/2010  . Malignant neoplasm of colon ([place]) 08/14/2009  . VITAMIN B12 DEFICIENCY 08/14/2009  . OBESITY 08/14/2009    Past Surgical History:  Procedure Laterality Date  . APPENDECTOMY    . CARDIAC CATHETERIZATION    . COLECTOMY  2007   right hemicolectomy  . COLONOSCOPY  05/11/10   normal rectum,normal residual colon with some angry appearring anastomtic mucosa with some erosions and oozing, bx unremarkable  . COLONOSCOPY  05/16/2012   Dr. Gala Romney- normal rectum, sigmoid diverticulosis, anastomotic ulcers. bx= acute ulcer and benign colonic mucosa with intramucosal lympoid aggregates  . COLONOSCOPY N/A 11/17/2016   Procedure: COLONOSCOPY;  Surgeon: Daneil Dolin, MD;  Location: [facility];  Service: Endoscopy;  Laterality: N/A;  100  . ESOPHAGEAL DILATION N/A 10/28/2015  Procedure: ESOPHAGEAL DILATION;  Surgeon: Daneil Dolin, MD;  Location: [facility];  Service: Endoscopy;  Laterality: N/A;  . ESOPHAGOGASTRODUODENOSCOPY N/A 10/28/2015   RMR: ulcerated proximal esophageal mass ad described status post biopsy. Abnormal distal esophagus biopsy. status post biopsy. Hiatal hernia . abnormal gastric mucosa doubt clinical significance status post biopsy  . EUS N/A 11/20/2015   Procedure: UPPER ENDOSCOPIC ULTRASOUND (EUS)  LINEAR;  Surgeon: Milus Banister, MD;  Location: [facility];  Service: Endoscopy;  Laterality: N/A;  . HEMORRHOID SURGERY  12/31/2011   Procedure: HEMORRHOIDECTOMY;  Surgeon: Jamesetta So;  Location: [facility] ORS;  Service: General;  Laterality: N/A;  . HERNIA REPAIR     umbilical  . KIDNEY STONE SURGERY    . PILONIDAL CYST / SINUS EXCISION  1976  . SKIN LESION EXCISION     left shoulder/pre cancerous  . TRANSURETHRAL RESECTION OF PROSTATE N/A 09/09/2016   Procedure: TRANSURETHRAL RESECTION OF THE PROSTATE (TURP);  Surgeon: Irine Seal, MD;  Location: [facility];  Service: Urology;  Laterality: N/A;       Home Medications    Prior to Admission medications   Medication Sig Start Date End Date Taking? Authorizing Provider  ALPRAZolam Duanne Moron) 0.5 MG tablet Take 0.25 mg by mouth at bedtime. For anxiety or sleep 10/24/12   Historical Provider, MD  brimonidine (ALPHAGAN) 0.2 % ophthalmic solution Place 1 drop into both eyes 2 (two) times daily.     Historical Provider, MD  buPROPion (WELLBUTRIN XL) 150 MG 24 hr tablet Take 450 mg by mouth daily.    Historical Provider, MD  dorzolamide-timolol (COSOPT) 22.3-6.8 MG/ML ophthalmic solution Place 1 drop into both eyes 2 (two) times daily.    Historical Provider, MD  lamoTRIgine (LAMICTAL) 200 MG tablet Take 200 mg by mouth at bedtime.  07/24/14   Historical Provider, MD  latanoprost (XALATAN) 0.005 % ophthalmic solution Place 1 drop into both eyes at bedtime.    Historical Provider, MD  Multiple Vitamin (MULITIVITAMIN WITH MINERALS) TABS Take 1 tablet by mouth daily.    Historical Provider, MD  naltrexone (DEPADE) 50 MG tablet Take 25 mg by mouth 2 (two) times daily.    Historical Provider, MD  omeprazole (PRILOSEC) 40 MG capsule Take 40 mg by mouth 2 (two) times daily.    Historical Provider, MD  Sod Picosulfate-Mag Ox-Cit Acd 10-3.5-12 MG-GM-GM PACK Take 1 Container by mouth as directed. Patient taking differently: Take 1 Container by mouth as directed.  Will do prior to procedure 10/27/16   Daneil Dolin, MD    Family History Family History  Problem Relation Age of Onset  . Lung cancer Father   . Diabetes Mother   . Anesthesia problems Neg Hx   . Hypotension Neg Hx   . Malignant hyperthermia Neg Hx   . Pseudochol deficiency Neg Hx     Social History Social History  Substance Use Topics  . Smoking status: Never Smoker  . Smokeless tobacco: Never Used     Comment: Never smoked  . Alcohol use No     Allergies   Bactrim [sulfamethoxazole-trimethoprim]; Dilaudid [hydromorphone hcl]; and Morphine and related   Review of Systems Review of Systems  Constitutional: Negative for appetite change and fatigue.  HENT: Negative for congestion, ear discharge and sinus pressure.   Eyes: Negative for discharge.  Respiratory: Negative for cough.   Cardiovascular: Negative for chest pain.  Gastrointestinal: Positive for abdominal pain (LLQ) and nausea. Negative for diarrhea and vomiting.  Genitourinary: Negative  for frequency and hematuria.  Musculoskeletal: Negative for back pain.  Skin: Negative for rash.  Neurological: Negative for seizures and headaches.  Psychiatric/Behavioral: Negative for hallucinations.     Physical Exam Updated Vital Signs BP 147/85 (BP Location: Left Arm)   Pulse (!) 58   Temp 97.5 F (36.4 C) (Oral)   Resp 16   Ht 5\' 9"  (1.753 m)   Wt 215 lb (97.5 kg)   SpO2 99%   BMI 31.75 kg/m   Physical Exam  Constitutional: He is oriented to person, place, and time. He appears well-developed.  HENT:  Head: Normocephalic.  Eyes: Conjunctivae and EOM are normal. No scleral icterus.  Neck: Neck supple. No thyromegaly present.  Cardiovascular: Normal rate and regular rhythm.  Exam reveals no gallop and no friction rub.   No murmur heard. Pulmonary/Chest: No stridor. He has no wheezes. He has no rales. He exhibits no tenderness.  Abdominal: He exhibits no distension. There is tenderness. There is no rebound.    Mild LLQ tenderness.   Musculoskeletal: Normal range of motion. He exhibits no edema.  Lymphadenopathy:    He has no cervical adenopathy.  Neurological: He is oriented to person, place, and time. He exhibits normal muscle tone. Coordination normal.  Skin: No rash noted. No erythema.  Psychiatric: He has a normal mood and affect. His behavior is normal.     ED Treatments / Results  DIAGNOSTIC STUDIES: Oxygen Saturation is 99% on RA, normal by my interpretation.    COORDINATION OF CARE: 1:38 PM- Pt advised of plan for treatment and pt agrees.  Labs (all labs ordered are listed, but only abnormal results are displayed) Labs Reviewed  CBC WITH DIFFERENTIAL/PLATELET  COMPREHENSIVE METABOLIC PANEL  URINALYSIS, ROUTINE W REFLEX MICROSCOPIC    EKG  EKG Interpretation None       Radiology No results found.  Procedures Procedures (including critical care time)  Medications Ordered in ED Medications - No data to display   Initial Impression / Assessment and Plan / ED Course  I have reviewed the triage vital signs and the nursing notes.  Pertinent labs & imaging results that were available during my care of the patient were reviewed by me and considered in my medical decision making (see chart for details).     Patient with left lower quadrant abdominal pain that has resolved. Urine and blood work unremarkable. Pain could be related to adhesions from previous surgery. Patient is instructed to follow-up with his doctor as needed  Final Clinical Impressions(s) / ED Diagnoses   Final diagnoses:  Pain    New Prescriptions New Prescriptions   No medications on file   The chart was scribed for me under my direct supervision.  I personally performed the history, physical, and medical decision making and all procedures in the evaluation of this patient.Milton Ferguson, MD 02/25/17 [phone]

## 2017-02-25 NOTE — ED Notes (Signed)
Lab called

## 2017-09-19 ENCOUNTER — Encounter: Payer: Self-pay | Admitting: Gastroenterology

## 2017-09-19 ENCOUNTER — Ambulatory Visit (INDEPENDENT_AMBULATORY_CARE_PROVIDER_SITE_OTHER): Payer: Medicare Other | Admitting: Gastroenterology

## 2017-09-19 DIAGNOSIS — R110 Nausea: Secondary | ICD-10-CM | POA: Diagnosis not present

## 2017-09-19 MED ORDER — PANTOPRAZOLE SODIUM 40 MG PO TBEC: 40.0000 mg | DELAYED_RELEASE_TABLET | Freq: Every day | ORAL | 5 refills | Status: DC

## 2017-09-19 NOTE — Patient Instructions (Addendum)
1. Start pantoprazole once daily before breakfast. RX sent to pharmacy.  2. Call in 2 weeks and let me know how you are feeling.    Food Choices for Gastroesophageal Reflux Disease, Adult When you have gastroesophageal reflux disease (GERD), the foods you eat and your eating habits are very important. Choosing the right foods can help ease your discomfort. What guidelines do I need to follow?  Choose fruits, vegetables, whole grains, and low-fat dairy products.  Choose low-fat meat, fish, and poultry.  Limit fats such as oils, salad dressings, butter, nuts, and avocado.  Keep a food diary. This helps you identify foods that cause symptoms.  Avoid foods that cause symptoms. These may be different for everyone.  Eat small meals often instead of 3 large meals a day.  Eat your meals slowly, in a place where you are relaxed.  Limit fried foods.  Cook foods using methods other than frying.  Avoid drinking alcohol.  Avoid drinking large amounts of liquids with your meals.  Avoid bending over or lying down until 2-3 hours after eating. What foods are not recommended? These are some foods and drinks that may make your symptoms worse: Vegetables Tomatoes. Tomato juice. Tomato and spaghetti sauce. Chili peppers. Onion and garlic. Horseradish. Fruits Oranges, grapefruit, and lemon (fruit and juice). Meats High-fat meats, fish, and poultry. This includes hot dogs, ribs, ham, sausage, salami, and bacon. Dairy Whole milk and chocolate milk. Sour cream. Cream. Butter. Ice cream. Cream cheese. Drinks Coffee and tea. Bubbly (carbonated) drinks or energy drinks. Condiments Hot sauce. Barbecue sauce. Sweets/Desserts Chocolate and cocoa. Donuts. Peppermint and spearmint. Fats and Oils High-fat foods. This includes [place] fries and potato chips. Other Vinegar. Strong spices. This includes black pepper, white pepper, red pepper, cayenne, curry powder, cloves, ginger, and chili powder. The  items listed above may not be a complete list of foods and drinks to avoid. Contact your dietitian for more information. This information is not intended to replace advice given to you by your health care provider. Make sure you discuss any questions you have with your health care provider. Document Released: 06/06/2012 Document Revised: 05/13/2016 Document Reviewed: 10/10/2013 Elsevier Interactive Patient Education  2017 [employer].

## 2017-09-19 NOTE — Progress Notes (Signed)
CC'D TO PCP °

## 2017-09-19 NOTE — Assessment & Plan Note (Signed)
Vague intermittent nausea of several months. Some postprandial component. Stopped omeprazole several months ago. Also switching lamictal to morning dose instead of pm. Increased stress related to aunt's illness. Discussed with patient, would consider trial of PPI given h/o complicated GERD in the past. If no improvement in two weeks, then consider repeat EGD. He will give me a call. Consider deep sedation in OR given polypharmacy.

## 2017-09-19 NOTE — Progress Notes (Signed)
Primary Care Physician: Rory Percy, MD  Primary Gastroenterologist:  Garfield Cornea, MD   Chief Complaint  Patient presents with  . Nausea    HPI: [name] is a 66 y.o. male here for further evaluation of nausea.   Last seen in 10/2016. He has h/o stage II colon cancer with resection in 2007.  Last TCS 10/2016 with ulcerated mucosa at the anastomosis. Biopsy benign. Next TCS 10/2022.  Patient also has a history of abnormal LFTs and fatty liver. EGD November 2016 with 2-3 cm; so, hard mass within 8-9 mm area of central ulceration in the proximal esophagus at 20-23 cm from the incisors.Salmon-colored epithelium also noted suspicious for Barrett's. No esophagitis. Gastric biopsy with chronic active gastritis with H pylori, subsequently treated, esophagogastric junction biopsy gastric-type mucosa with mixed inflammation and lymphoid follicles. Esophageal mass biopsy benign, it was felt that biopsy may not be sufficient due to the approach. He subsequently underwent an EUS with Dr. Owens Loffler on 11/20/2015. At this time the EGD findings were entirely normal. EUS findings of the esophagus normal. He had a CT of the chest which was normal. Questionable mass related to GERD, edema resolved after starting PPI therapy?.  Gained 40 pounds in past 10 months. Had been taking care of this elderly Aunt who has subsequently passed away 2 weeks ago.   Intermittent nausea over the last couple of months. Off omeprazole for about 3 months. Doesn't feel like has heartburn or reflux. Unsettled stomach. No vomiting. Some PP belching. No abdominal pain. Couple of episodes of severe left flank pain, pain lasted couple of hours. Almost had to go to ER. BM ok. No melena, brbpr. Medication changes include changing Lamictal to AM dose instead of PM. Off natrexone for several months. Nausea sometimes better with food and sometimes worse with food. Rare advil pm.  Had previously been on B12 shots for years.  Recent B12 low, received one shot from pcp and restarted oral b12 with plans of monitoring b12.    Current Outpatient Prescriptions  Medication Sig Dispense Refill  . ALPRAZolam (XANAX) 0.5 MG tablet Take 0.25 mg by mouth at bedtime. For anxiety or sleep    . brimonidine (ALPHAGAN) 0.2 % ophthalmic solution Place 1 drop into both eyes 2 (two) times daily.     Marland Kitchen buPROPion (WELLBUTRIN XL) 150 MG 24 hr tablet Take 450 mg by mouth daily.    . dorzolamide-timolol (COSOPT) 22.3-6.8 MG/ML ophthalmic solution Place 1 drop into both eyes 2 (two) times daily.    Marland Kitchen lamoTRIgine (LAMICTAL) 200 MG tablet Take 200 mg by mouth at bedtime.     Marland Kitchen latanoprost (XALATAN) 0.005 % ophthalmic solution Place 1 drop into both eyes at bedtime.    . Multiple Vitamin (MULITIVITAMIN WITH MINERALS) TABS Take 1 tablet by mouth daily.             No current facility-administered medications for this visit.     Allergies as of 09/19/2017 - Review Complete 09/19/2017  Allergen Reaction Noted  . Bactrim [sulfamethoxazole-trimethoprim] Hives and Swelling 09/08/2016  . Dilaudid [hydromorphone hcl] Other (See Comments) 09/08/2016  . Morphine and related Nausea And Vomiting 09/08/2016   Past Medical History:  Diagnosis Date  . Anemia    hx of   . Arthritis    fingers   . BPH (benign prostatic hyperplasia)   . Colon cancer ([place])    stage II, diagnosed 2007  . Complication of anesthesia    vagal  response after umbilcal hernia at [facility]  . Depression   . Diabetes mellitus    no as of 09/08/16 due to weight loss   . GERD (gastroesophageal reflux disease)   . Glaucoma 2012  . Sleep apnea    wears CiPaP at night  . Staph infection 2008   left side of face   Past Surgical History:  Procedure Laterality Date  . APPENDECTOMY    . CARDIAC CATHETERIZATION    . COLECTOMY  2007   right hemicolectomy  . COLONOSCOPY  05/11/10   normal rectum,normal residual colon with some angry appearring anastomtic mucosa with some  erosions and oozing, bx unremarkable  . COLONOSCOPY  05/16/2012   Dr. Gala Romney- normal rectum, sigmoid diverticulosis, anastomotic ulcers. bx= acute ulcer and benign colonic mucosa with intramucosal lympoid aggregates  . COLONOSCOPY N/A 11/17/2016   Procedure: COLONOSCOPY;  Surgeon: Daneil Dolin, MD;  [facility];  Service: Endoscopy;  Laterality: N/A;  100  . ESOPHAGEAL DILATION N/A 10/28/2015   Procedure: ESOPHAGEAL DILATION;  Surgeon: Daneil Dolin, MD;  [facility];  Service: Endoscopy;  Laterality: N/A;  . ESOPHAGOGASTRODUODENOSCOPY N/A 10/28/2015   RMR: ulcerated proximal esophageal mass ad described status post biopsy. Abnormal distal esophagus biopsy. status post biopsy. Hiatal hernia . abnormal gastric mucosa doubt clinical significance status post biopsy  . EUS N/A 11/20/2015   Procedure: UPPER ENDOSCOPIC ULTRASOUND (EUS) LINEAR;  Surgeon: Milus Banister, MD;  [facility];  Service: Endoscopy;  Laterality: N/A;  . HEMORRHOID SURGERY  12/31/2011   Procedure: HEMORRHOIDECTOMY;  Surgeon: Jamesetta So;  Location: [facility];  Service: General;  Laterality: N/A;  . HERNIA REPAIR     umbilical  . KIDNEY STONE SURGERY    . PILONIDAL CYST / SINUS EXCISION  1976  . SKIN LESION EXCISION     left shoulder/pre cancerous  . TRANSURETHRAL RESECTION OF PROSTATE N/A 09/09/2016   Procedure: TRANSURETHRAL RESECTION OF THE PROSTATE (TURP);  Surgeon: Irine Seal, MD;  Location: [facility];  Service: Urology;  Laterality: N/A;   Family History  Problem Relation Age of Onset  . Lung cancer Father   . Diabetes Mother   . Anesthesia problems Neg Hx   . Hypotension Neg Hx   . Malignant hyperthermia Neg Hx   . Pseudochol deficiency Neg Hx    Social History  Substance Use Topics  . Smoking status: Never Smoker  . Smokeless tobacco: Never Used     Comment: Never smoked  . Alcohol use No    ROS:  General: Negative for anorexia, weight loss, fever, chills, fatigue,  weakness. ENT: Negative for hoarseness, difficulty swallowing , nasal congestion. CV: Negative for chest pain, angina, palpitations, dyspnea on exertion, peripheral edema.  Respiratory: Negative for dyspnea at rest, dyspnea on exertion, cough, sputum, wheezing.  GI: See history of present illness. GU:  Negative for dysuria, hematuria, urinary incontinence, urinary frequency, nocturnal urination.  Endo: Negative for unusual weight change.    Physical Examination:   BP (!) 145/86   Pulse 60   Temp 97.8 F (36.6 C) (Oral)   Ht 5\' 9"  (1.753 m)   Wt 233 lb (105.7 kg)   BMI 34.41 kg/m   General: Well-nourished, well-developed in no acute distress.  Eyes: No icterus. Mouth: Oropharyngeal mucosa moist and pink , no lesions erythema or exudate. Lungs: Clear to auscultation bilaterally.  Heart: Regular rate and rhythm, no murmurs rubs or gallops.  Abdomen: Bowel sounds are normal, nontender, nondistended,  no hepatosplenomegaly or masses, no abdominal bruits or hernia , no rebound or guarding.   Extremities: No lower extremity edema. No clubbing or deformities. Neuro: Alert and oriented x 4   Skin: Warm and dry, no jaundice.   Psych: Alert and cooperative, normal mood and affect.  Labs:  09/13/2017: cea 1.6, HgbA1C 5.9, Vitamin B12 212, wbc 6100, H/H 15/43, Platelets 156, cre 0.96, tbili 0.4, ap 97, ast 27, alt 34, alb 4.2  Imaging Studies: No results found.

## 2017-10-04 ENCOUNTER — Telehealth: Payer: Self-pay | Admitting: Internal Medicine

## 2017-10-04 NOTE — Telephone Encounter (Signed)
PATIENT CAME IN OFFICE AND STATED HIS EPISODES ARE BETTER BUT STILL HAVING THEM AFTER HE EATS, MORE [place].  [phone]

## 2017-10-06 NOTE — Telephone Encounter (Signed)
LMOM for return call to let pt know what is recommended. After I speak with Pt, I will Route message to get apt scheduled.

## 2017-10-06 NOTE — Telephone Encounter (Signed)
Would offer him an upper endoscopy with deep sedation by Dr. Gala Romney. Dx: nausea, dyspepsia.

## 2017-10-07 ENCOUNTER — Other Ambulatory Visit: Payer: Self-pay

## 2017-10-07 DIAGNOSIS — R110 Nausea: Secondary | ICD-10-CM

## 2017-10-07 DIAGNOSIS — R1013 Epigastric pain: Secondary | ICD-10-CM

## 2017-10-07 NOTE — Telephone Encounter (Signed)
Called pt. EGD w/Propofol with RMR scheduled for 11/07/17 at 8:15am. Instructions will be mailed with pre-op appt letter. Orders entered.

## 2017-10-07 NOTE — Telephone Encounter (Signed)
Pre-op appt 11/01/17 at 2:15pm. Letter mailed with his procedure instructions.

## 2017-10-07 NOTE — Telephone Encounter (Signed)
Pt returned call and is ready to get his procedure scheduled. Routing message

## 2017-10-11 ENCOUNTER — Ambulatory Visit: Payer: BC Managed Care – PPO | Admitting: Internal Medicine

## 2017-10-31 NOTE — Patient Instructions (Signed)
[name]  10/31/2017     @PREFPERIOPPHARMACY @   Your procedure is scheduled on  11/07/2017   Report to [facility] at  645  A.M.  Call this number if you have problems the morning of surgery:  [phone]   Remember:  Do not eat food or drink liquids after midnight.  Take these medicines the morning of surgery with A SIP OF WATER  Wellbutrin, protonix.   Do not wear jewelry, make-up or nail polish.  Do not wear lotions, powders, or perfumes, or deoderant.  Do not shave 48 hours prior to surgery.  Men may shave face and neck.  Do not bring valuables to the hospital.  [facility] is not responsible for any belongings or valuables.  Contacts, dentures or bridgework may not be worn into surgery.  Leave your suitcase in the car.  After surgery it may be brought to your room.  For patients admitted to the hospital, discharge time will be determined by your treatment team.  Patients discharged the day of surgery will not be allowed to drive home.   Name and phone number of your driver:   family Special instructions:  Follow the diet instructions given to you by Dr Roseanne Kaufman office.  Please read over the following fact sheets that you were given. Anesthesia Post-op Instructions and Care and Recovery After Surgery       Esophagogastroduodenoscopy Esophagogastroduodenoscopy (EGD) is a procedure to examine the lining of the esophagus, stomach, and first part of the small intestine (duodenum). This procedure is done to check for problems such as inflammation, bleeding, ulcers, or growths. During this procedure, a long, flexible, lighted tube with a camera attached (endoscope) is inserted down the throat. Tell a health care provider about:  Any allergies you have.  All medicines you are taking, including vitamins, herbs, eye drops, creams, and over-the-counter medicines.  Any problems you or family members have had with anesthetic medicines.  Any blood disorders  you have.  Any surgeries you have had.  Any medical conditions you have.  Whether you are pregnant or may be pregnant. What are the risks? Generally, this is a safe procedure. However, problems may occur, including:  Infection.  Bleeding.  A tear (perforation) in the esophagus, stomach, or duodenum.  Trouble breathing.  Excessive sweating.  Spasms of the larynx.  A slowed heartbeat.  Low blood pressure.  What happens before the procedure?  Follow instructions from your health care provider about eating or drinking restrictions.  Ask your health care provider about: ? Changing or stopping your regular medicines. This is especially important if you are taking diabetes medicines or blood thinners. ? Taking medicines such as aspirin and ibuprofen. These medicines can thin your blood. Do not take these medicines before your procedure if your health care provider instructs you not to.  Plan to have someone take you home after the procedure.  If you wear dentures, be ready to remove them before the procedure. What happens during the procedure?  To reduce your risk of infection, your health care team will wash or sanitize their hands.  An IV tube will be put in a vein in your hand or arm. You will get medicines and fluids through this tube.  You will be given one or more of the following: ? A medicine to help you relax (sedative). ? A medicine to numb the area (local anesthetic). This medicine may be sprayed into your throat. It  will make you feel more comfortable and keep you from gagging or coughing during the procedure. ? A medicine for pain.  A mouth guard may be placed in your mouth to protect your teeth and to keep you from biting on the endoscope.  You will be asked to lie on your left side.  The endoscope will be lowered down your throat into your esophagus, stomach, and duodenum.  Air will be put into the endoscope. This will help your health care provider see  better.  The lining of your esophagus, stomach, and duodenum will be examined.  Your health care provider may: ? Take a tissue sample so it can be looked at in a lab (biopsy). ? Remove growths. ? Remove objects (foreign bodies) that are stuck. ? Treat any bleeding with medicines or other devices that stop tissue from bleeding. ? Widen (dilate) or stretch narrowed areas of your esophagus and stomach.  The endoscope will be taken out. The procedure may vary among health care providers and hospitals. What happens after the procedure?  Your blood pressure, heart rate, breathing rate, and blood oxygen level will be monitored often until the medicines you were given have worn off.  Do not eat or drink anything until the numbing medicine has worn off and your gag reflex has returned. This information is not intended to replace advice given to you by your health care provider. Make sure you discuss any questions you have with your health care provider. Document Released: 04/08/2005 Document Revised: 05/13/2016 Document Reviewed: 10/30/2015 Elsevier Interactive Patient Education  2018 [employer]. Esophagogastroduodenoscopy, Care After Refer to this sheet in the next few weeks. These instructions provide you with information about caring for yourself after your procedure. Your health care provider may also give you more specific instructions. Your treatment has been planned according to current medical practices, but problems sometimes occur. Call your health care provider if you have any problems or questions after your procedure. What can I expect after the procedure? After the procedure, it is common to have:  A sore throat.  Nausea.  Bloating.  Dizziness.  Fatigue.  Follow these instructions at home:  Do not eat or drink anything until the numbing medicine (local anesthetic) has worn off and your gag reflex has returned. You will know that the local anesthetic has worn off when you  can swallow comfortably.  Do not drive for 24 hours if you received a medicine to help you relax (sedative).  If your health care provider took a tissue sample for testing during the procedure, make sure to get your test results. This is your responsibility. Ask your health care provider or the department performing the test when your results will be ready.  Keep all follow-up visits as told by your health care provider. This is important. Contact a health care provider if:  You cannot stop coughing.  You are not urinating.  You are urinating less than usual. Get help right away if:  You have trouble swallowing.  You cannot eat or drink.  You have throat or chest pain that gets worse.  You are dizzy or light-headed.  You faint.  You have nausea or vomiting.  You have chills.  You have a fever.  You have severe abdominal pain.  You have black, tarry, or bloody stools. This information is not intended to replace advice given to you by your health care provider. Make sure you discuss any questions you have with your health care provider. Document Released:  11/22/2012 Document Revised: 05/13/2016 Document Reviewed: 10/30/2015 Elsevier Interactive Patient Education  2018 [place] Anesthesia is a term that refers to techniques, procedures, and medicines that help a person stay safe and comfortable during a medical procedure. Monitored anesthesia care, or sedation, is one type of anesthesia. Your anesthesia specialist may recommend sedation if you will be having a procedure that does not require you to be unconscious, such as:  Cataract surgery.  A dental procedure.  A biopsy.  A colonoscopy.  During the procedure, you may receive a medicine to help you relax (sedative). There are three levels of sedation:  Mild sedation. At this level, you may feel awake and relaxed. You will be able to follow directions.  Moderate sedation. At this  level, you will be sleepy. You may not remember the procedure.  Deep sedation. At this level, you will be asleep. You will not remember the procedure.  The more medicine you are given, the deeper your level of sedation will be. Depending on how you respond to the procedure, the anesthesia specialist may change your level of sedation or the type of anesthesia to fit your needs. An anesthesia specialist will monitor you closely during the procedure. Let your health care provider know about:  Any allergies you have.  All medicines you are taking, including vitamins, herbs, eye drops, creams, and over-the-counter medicines.  Any use of steroids (by mouth or as a cream).  Any problems you or family members have had with sedatives and anesthetic medicines.  Any blood disorders you have.  Any surgeries you have had.  Any medical conditions you have, such as sleep apnea.  Whether you are pregnant or may be pregnant.  Any use of cigarettes, alcohol, or street drugs. What are the risks? Generally, this is a safe procedure. However, problems may occur, including:  Getting too much medicine (oversedation).  Nausea.  Allergic reaction to medicines.  Trouble breathing. If this happens, a breathing tube may be used to help with breathing. It will be removed when you are awake and breathing on your own.  Heart trouble.  Lung trouble.  Before the procedure Staying hydrated Follow instructions from your health care provider about hydration, which may include:  Up to 2 hours before the procedure - you may continue to drink clear liquids, such as water, clear fruit juice, black coffee, and plain tea.  Eating and drinking restrictions Follow instructions from your health care provider about eating and drinking, which may include:  8 hours before the procedure - stop eating heavy meals or foods such as meat, fried foods, or fatty foods.  6 hours before the procedure - stop eating light  meals or foods, such as toast or cereal.  6 hours before the procedure - stop drinking milk or drinks that contain milk.  2 hours before the procedure - stop drinking clear liquids.  Medicines Ask your health care provider about:  Changing or stopping your regular medicines. This is especially important if you are taking diabetes medicines or blood thinners.  Taking medicines such as aspirin and ibuprofen. These medicines can thin your blood. Do not take these medicines before your procedure if your health care provider instructs you not to.  Tests and exams  You will have a physical exam.  You may have blood tests done to show: ? How well your kidneys and liver are working. ? How well your blood can clot.  General instructions  Plan to have someone take you  home from the hospital or clinic.  If you will be going home right after the procedure, plan to have someone with you for 24 hours.  What happens during the procedure?  Your blood pressure, heart rate, breathing, level of pain and overall condition will be monitored.  An IV tube will be inserted into one of your veins.  Your anesthesia specialist will give you medicines as needed to keep you comfortable during the procedure. This may mean changing the level of sedation.  The procedure will be performed. After the procedure  Your blood pressure, heart rate, breathing rate, and blood oxygen level will be monitored until the medicines you were given have worn off.  Do not drive for 24 hours if you received a sedative.  You may: ? Feel sleepy, clumsy, or nauseous. ? Feel forgetful about what happened after the procedure. ? Have a sore throat if you had a breathing tube during the procedure. ? Vomit. This information is not intended to replace advice given to you by your health care provider. Make sure you discuss any questions you have with your health care provider. Document Released: 09/01/2005 Document Revised:  05/14/2016 Document Reviewed: 03/28/2016 Elsevier Interactive Patient Education  [employer].

## 2017-11-01 ENCOUNTER — Encounter (HOSPITAL_COMMUNITY): Payer: Self-pay

## 2017-11-01 ENCOUNTER — Other Ambulatory Visit: Payer: Self-pay

## 2017-11-01 ENCOUNTER — Encounter (HOSPITAL_COMMUNITY)
Admission: RE | Admit: 2017-11-01 | Discharge: 2017-11-01 | Disposition: A | Discharge status: Home / Self Care | Payer: Medicare Other | Source: Ambulatory Visit | Attending: Internal Medicine | Admitting: Internal Medicine

## 2017-11-01 DIAGNOSIS — Z01812 Encounter for preprocedural laboratory examination: Secondary | ICD-10-CM | POA: Insufficient documentation

## 2017-11-01 DIAGNOSIS — Z01818 Encounter for other preprocedural examination: Secondary | ICD-10-CM | POA: Insufficient documentation

## 2017-11-01 HISTORY — DX: Personal history of urinary calculi: Z87.442

## 2017-11-01 LAB — CBC WITH DIFFERENTIAL/PLATELET
Basophils Absolute: 0 10*3/uL (ref 0.0–0.1)
Basophils Relative: 0 %
EOS ABS: 0.1 10*3/uL (ref 0.0–0.7)
EOS PCT: 2 %
HCT: 43.1 % (ref 39.0–52.0)
Hemoglobin: 15 g/dL (ref 13.0–17.0)
LYMPHS ABS: 1.5 10*3/uL (ref 0.7–4.0)
Lymphocytes Relative: 26 %
MCH: 30.9 pg (ref 26.0–34.0)
MCHC: 34.8 g/dL (ref 30.0–36.0)
MCV: 88.7 fL (ref 78.0–100.0)
MONO ABS: 0.7 10*3/uL (ref 0.1–1.0)
MONOS PCT: 11 %
NEUTROS ABS: 3.6 10*3/uL (ref 1.7–7.7)
NEUTROS PCT: 61 %
PLATELETS: 179 10*3/uL (ref 150–400)
RBC: 4.86 MIL/uL (ref 4.22–5.81)
RDW: 13 % (ref 11.5–15.5)
WBC: 5.9 10*3/uL (ref 4.0–10.5)

## 2017-11-01 LAB — BASIC METABOLIC PANEL
Anion gap: 8 (ref 5–15)
BUN: 21 mg/dL — AB (ref 6–20)
CO2: 24 mmol/L (ref 22–32)
CREATININE: 0.94 mg/dL (ref 0.61–1.24)
Calcium: 9.1 mg/dL (ref 8.9–10.3)
Chloride: 103 mmol/L (ref 101–111)
GFR calc Af Amer: >60 mL/min (ref >60)
GLUCOSE: 83 mg/dL (ref 65–99)
POTASSIUM: 3.7 mmol/L (ref 3.5–5.1)
SODIUM: 135 mmol/L (ref 135–145)

## 2017-11-07 ENCOUNTER — Ambulatory Visit (HOSPITAL_COMMUNITY): Payer: Medicare Other | Admitting: Anesthesiology

## 2017-11-07 ENCOUNTER — Encounter (HOSPITAL_COMMUNITY): Payer: Self-pay | Admitting: *Deleted

## 2017-11-07 ENCOUNTER — Encounter (HOSPITAL_COMMUNITY)
Admission: RE | Disposition: A | Discharge status: Home / Self Care | Payer: Self-pay | Source: Ambulatory Visit | Attending: Internal Medicine

## 2017-11-07 ENCOUNTER — Ambulatory Visit (HOSPITAL_COMMUNITY)
Admission: RE | Admit: 2017-11-07 | Discharge: 2017-11-07 | Disposition: A | Discharge status: Home / Self Care | Payer: Medicare Other | Source: Ambulatory Visit | Attending: Internal Medicine | Admitting: Internal Medicine

## 2017-11-07 DIAGNOSIS — G4730 Sleep apnea, unspecified: Secondary | ICD-10-CM | POA: Insufficient documentation

## 2017-11-07 DIAGNOSIS — Z79899 Other long term (current) drug therapy: Secondary | ICD-10-CM | POA: Diagnosis not present

## 2017-11-07 DIAGNOSIS — E119 Type 2 diabetes mellitus without complications: Secondary | ICD-10-CM | POA: Insufficient documentation

## 2017-11-07 DIAGNOSIS — Z9049 Acquired absence of other specified parts of digestive tract: Secondary | ICD-10-CM | POA: Insufficient documentation

## 2017-11-07 DIAGNOSIS — Z85038 Personal history of other malignant neoplasm of large intestine: Secondary | ICD-10-CM | POA: Insufficient documentation

## 2017-11-07 DIAGNOSIS — R635 Abnormal weight gain: Secondary | ICD-10-CM | POA: Diagnosis not present

## 2017-11-07 DIAGNOSIS — N400 Enlarged prostate without lower urinary tract symptoms: Secondary | ICD-10-CM | POA: Diagnosis not present

## 2017-11-07 DIAGNOSIS — Z882 Allergy status to sulfonamides status: Secondary | ICD-10-CM | POA: Insufficient documentation

## 2017-11-07 DIAGNOSIS — Z801 Family history of malignant neoplasm of trachea, bronchus and lung: Secondary | ICD-10-CM | POA: Insufficient documentation

## 2017-11-07 DIAGNOSIS — F329 Major depressive disorder, single episode, unspecified: Secondary | ICD-10-CM | POA: Diagnosis not present

## 2017-11-07 DIAGNOSIS — R1013 Epigastric pain: Secondary | ICD-10-CM

## 2017-11-07 DIAGNOSIS — R110 Nausea: Secondary | ICD-10-CM | POA: Insufficient documentation

## 2017-11-07 DIAGNOSIS — M19049 Primary osteoarthritis, unspecified hand: Secondary | ICD-10-CM | POA: Diagnosis not present

## 2017-11-07 DIAGNOSIS — K219 Gastro-esophageal reflux disease without esophagitis: Secondary | ICD-10-CM | POA: Insufficient documentation

## 2017-11-07 DIAGNOSIS — Z885 Allergy status to narcotic agent status: Secondary | ICD-10-CM | POA: Insufficient documentation

## 2017-11-07 DIAGNOSIS — Z833 Family history of diabetes mellitus: Secondary | ICD-10-CM | POA: Diagnosis not present

## 2017-11-07 HISTORY — PX: ESOPHAGOGASTRODUODENOSCOPY (EGD) WITH PROPOFOL: SHX5813

## 2017-11-07 LAB — GLUCOSE, CAPILLARY: Glucose-Capillary: 79 mg/dL (ref 65–99)

## 2017-11-07 SURGERY — ESOPHAGOGASTRODUODENOSCOPY (EGD) WITH PROPOFOL
Anesthesia: Monitor Anesthesia Care

## 2017-11-07 MED ORDER — MIDAZOLAM HCL 2 MG/2ML IJ SOLN
1.0000 mg | INTRAMUSCULAR | Status: AC
  Administered 2017-11-07: 2 mg via INTRAVENOUS
  Filled 2017-11-07: qty 2

## 2017-11-07 MED ORDER — PROPOFOL 500 MG/50ML IV EMUL
INTRAVENOUS | Status: DC | PRN
  Administered 2017-11-07: 125 ug/kg/min via INTRAVENOUS

## 2017-11-07 MED ORDER — LACTATED RINGERS IV SOLN
INTRAVENOUS | Status: DC
  Administered 2017-11-07: 1000 mL via INTRAVENOUS

## 2017-11-07 MED ORDER — CHLORHEXIDINE GLUCONATE CLOTH 2 % EX PADS
6.0000 | MEDICATED_PAD | Freq: Once | CUTANEOUS | Status: DC
Start: 2017-11-07 — End: 2017-11-07

## 2017-11-07 MED ORDER — PROPOFOL 10 MG/ML IV BOLUS
INTRAVENOUS | Status: DC | PRN
  Administered 2017-11-07 (×2): 20 mg via INTRAVENOUS

## 2017-11-07 MED ORDER — FENTANYL CITRATE (PF) 100 MCG/2ML IJ SOLN
INTRAMUSCULAR | Status: AC
  Filled 2017-11-07: qty 2

## 2017-11-07 MED ORDER — LIDOCAINE VISCOUS 2 % MT SOLN
OROMUCOSAL | Status: AC
  Filled 2017-11-07: qty 15

## 2017-11-07 MED ORDER — CHLORHEXIDINE GLUCONATE CLOTH 2 % EX PADS: 6.0000 | MEDICATED_PAD | Freq: Once | CUTANEOUS | Status: DC

## 2017-11-07 MED ORDER — LIDOCAINE VISCOUS 2 % MT SOLN
3.0000 mL | OROMUCOSAL | Status: AC | PRN
  Administered 2017-11-07 (×2): 3 mL via OROMUCOSAL

## 2017-11-07 MED ORDER — FENTANYL CITRATE (PF) 100 MCG/2ML IJ SOLN
25.0000 ug | Freq: Once | INTRAMUSCULAR | Status: AC
  Administered 2017-11-07: 25 ug via INTRAVENOUS

## 2017-11-07 NOTE — H&P (Signed)
@LOGO @   Primary Care Physician:  Rory Percy, MD Primary Gastroenterologist:  Dr. Gala Romney  Pre-Procedure History & Physical: HPI:  [name] is a 66 y.o. male here for further evaluation of intermittent nausea but no vomiting in a setting of significant weight gain. Takes Protonix 40 mg daily. Denies dysphagia.  Past Medical History:  Diagnosis Date  . Anemia    hx of   . Arthritis    fingers   . BPH (benign prostatic hyperplasia)   . Colon cancer ([place])    stage II, diagnosed 2007  . Complication of anesthesia    vagal response after umbilcal hernia at [place]  . Depression   . Diabetes mellitus    no as of 09/08/16 due to weight loss   . GERD (gastroesophageal reflux disease)   . Glaucoma 2012  . History of kidney stones   . Sleep apnea    wears CiPaP at night  . Staph infection 2008   left side of face    Past Surgical History:  Procedure Laterality Date  . APPENDECTOMY    . CARDIAC CATHETERIZATION    . COLECTOMY  2007   right hemicolectomy  . COLONOSCOPY  05/11/10   normal rectum,normal residual colon with some angry appearring anastomtic mucosa with some erosions and oozing, bx unremarkable  . COLONOSCOPY N/A 11/17/2016   Performed by Daneil Dolin, MD at [place]  . COLONOSCOPY N/A 05/16/2012   Performed by Daneil Dolin, MD at [place] N/A 10/28/2015   Performed by Daneil Dolin, MD at [place]  . ESOPHAGOGASTRODUODENOSCOPY (EGD) N/A 10/28/2015   Performed by Daneil Dolin, MD at [place]  . HEMORRHOIDECTOMY N/A 12/31/2011   Performed by Jamesetta So, MD at [facility]  . HERNIA REPAIR     umbilical  . KIDNEY STONE SURGERY    . PILONIDAL CYST / SINUS EXCISION  1976  . SKIN LESION EXCISION     left shoulder/pre cancerous  . TRANSURETHRAL RESECTION OF THE PROSTATE (TURP) N/A 09/09/2016   Performed by Irine Seal, MD at [facility]  . UPPER ENDOSCOPIC ULTRASOUND (EUS) LINEAR N/A 11/20/2015   Performed by Milus Banister, MD at [place]    Prior to Admission medications   Medication Sig Start Date End Date Taking? Authorizing Provider  ALPRAZolam Duanne Moron) 0.5 MG tablet Take 0.25 mg by mouth at bedtime. For anxiety or sleep 10/24/12  Yes [provider]  brimonidine (ALPHAGAN) 0.2 % ophthalmic solution Place 1 drop into both eyes 2 (two) times daily.    Yes [provider]  buPROPion (WELLBUTRIN XL) 150 MG 24 hr tablet Take 450 mg by mouth daily.   Yes [provider]  diphenhydramine-acetaminophen (TYLENOL PM) 25-500 MG TABS tablet Take 1 tablet at bedtime as needed by mouth (SLEEP).   Yes [provider]  dorzolamide-timolol (COSOPT) 22.3-6.8 MG/ML ophthalmic solution Place 1 drop into both eyes 2 (two) times daily.   Yes [provider]  lamoTRIgine (LAMICTAL) 200 MG tablet Take 200 mg daily by mouth.  07/24/14  Yes [provider]  latanoprost (XALATAN) 0.005 % ophthalmic solution Place 1 drop into both eyes at bedtime.   Yes [provider]  pantoprazole (PROTONIX) 40 MG tablet Take 1 tablet (40 mg total) by mouth daily before breakfast. 09/19/17  Yes Mahala Menghini, PA-C  vitamin B-12 (CYANOCOBALAMIN) 500 MCG tablet Take 500 mcg every evening by  mouth.   Yes [provider]    Allergies as of 10/07/2017 - Review Complete 09/19/2017  Allergen Reaction Noted  . Bactrim [sulfamethoxazole-trimethoprim] Hives and Swelling 09/08/2016  . Dilaudid [hydromorphone hcl] Other (See Comments) 09/08/2016  . Morphine and related Nausea And Vomiting 09/08/2016    Family History  Problem Relation Age of Onset  . Lung cancer Father   . Diabetes Mother   . Anesthesia problems Neg Hx   . Hypotension Neg Hx   . Malignant hyperthermia Neg Hx   . Pseudochol deficiency Neg Hx     Social History   Socioeconomic History  . Marital status: Married    Spouse name: Not on file  . Number of children: 1  . Years of education: Not on file   . Highest education level: Not on file  Social Needs  . Financial resource strain: Not on file  . Food insecurity - worry: Not on file  . Food insecurity - inability: Not on file  . Transportation needs - medical: Not on file  . Transportation needs - non-medical: Not on file  Occupational History  . Occupation: school system    Employer: [employer]  Tobacco Use  . Smoking status: Never Smoker  . Smokeless tobacco: Never Used  . Tobacco comment: Never smoked  Substance and Sexual Activity  . Alcohol use: No    Alcohol/week: 0.0 oz  . Drug use: No  . Sexual activity: Yes    Birth control/protection: None  Other Topics Concern  . Not on file  Social History Narrative  . Not on file    Review of Systems: See HPI, otherwise negative ROS  Physical Exam: BP (!) 153/83   Temp 98 F (36.7 C) (Oral)   Resp 18   SpO2 98%  General:   Alert,  , pleasant and cooperative in NAD Neck:  Supple; no masses or thyromegaly. No significant cervical adenopathy. Lungs:  Clear throughout to auscultation.   No wheezes, crackles, or rhonchi. No acute distress. Heart:  Regular rate and rhythm; no murmurs, clicks, rubs,  or gallops. Abdomen: Non-distended, normal bowel sounds.  Soft and nontender without appreciable mass or hepatosplenomegaly.  Pulses:  Normal pulses noted. Extremities:  Without clubbing or edema.  Impression:  Very pleasant 66 year old gentleman with intermittent nausea without vomiting. History of GERD. Significant weight gain recently. No dysphagia no odynophagia. Further evaluation warranted at this time.   Recommendations:  I have recommended diagnostic EGD.The risks, benefits, limitations, alternatives and imponderables have been reviewed with the patient. Potential for esophageal dilation, biopsy, etc. have also been reviewed.  Questions have been answered. All parties agreeable.                      Notice: This dictation was prepared with  Dragon dictation along with smaller phrase technology. Any transcriptional errors that result from this process are unintentional and may not be corrected upon review.

## 2017-11-07 NOTE — Anesthesia Preprocedure Evaluation (Signed)
Anesthesia Evaluation  Patient identified by MRN, date of birth, ID band Patient awake    Reviewed: Allergy & Precautions, NPO status , Patient's Chart, lab work & pertinent test results  History of Anesthesia Complications Negative for: history of anesthetic complications  Airway Mallampati: II  TM Distance: >3 FB Neck ROM: Full    Dental  (+) Dental Advisory Given   Pulmonary sleep apnea and Continuous Positive Airway Pressure Ventilation ,    breath sounds clear to auscultation       Cardiovascular (-) anginanegative cardio ROS   Rhythm:Regular Rate:Normal  '14 Cardiolite, which was NL; EF 62%. '08 cath: normal coronaries   Neuro/Psych Depression    GI/Hepatic Neg liver ROS, GERD  Medicated and Controlled,  Endo/Other  diabetes (Pre-DM, no meds), Type 2  Renal/GU negative Renal ROS     Musculoskeletal   Abdominal   Peds  Hematology negative hematology ROS (+) anemia ,   Anesthesia Other Findings   Reproductive/Obstetrics                             Anesthesia Physical Anesthesia Plan  ASA: III  Anesthesia Plan: MAC   Post-op Pain Management:    Induction: Intravenous  PONV Risk Score and Plan:   Airway Management Planned: Simple Face Mask  Additional Equipment:   Intra-op Plan:   Post-operative Plan:   Informed Consent: I have reviewed the patients History and Physical, chart, labs and discussed the procedure including the risks, benefits and alternatives for the proposed anesthesia with the patient or authorized representative who has indicated his/her understanding and acceptance.     Plan Discussed with: CRNA, Anesthesiologist and Surgeon  Anesthesia Plan Comments:         Anesthesia Quick Evaluation

## 2017-11-07 NOTE — Anesthesia Postprocedure Evaluation (Signed)
Anesthesia Post Note  Patient: [name]  Procedure(s) Performed: ESOPHAGOGASTRODUODENOSCOPY (EGD) WITH PROPOFOL (N/A )  Patient location during evaluation: PACU Anesthesia Type: MAC Level of consciousness: awake and alert Pain management: satisfactory to patient Vital Signs Assessment: post-procedure vital signs reviewed and stable Respiratory status: spontaneous breathing Cardiovascular status: stable Postop Assessment: no apparent nausea or vomiting Anesthetic complications: no     Last Vitals:  Vitals:   11/07/17 0723 11/07/17 0800  BP: (!) 153/83 132/80  Resp: 18   Temp: 36.7 C   SpO2: 98% 99%    Last Pain:  Vitals:   11/07/17 0723  TempSrc: Oral                 Bryceton Hantz

## 2017-11-07 NOTE — Transfer of Care (Signed)
Immediate Anesthesia Transfer of Care Note  Patient: [name]  Procedure(s) Performed: ESOPHAGOGASTRODUODENOSCOPY (EGD) WITH PROPOFOL (N/A )  Patient Location: PACU  Anesthesia Type:MAC  Level of Consciousness: awake and patient cooperative  Airway & Oxygen Therapy: Patient Spontanous Breathing  Post-op Assessment: Report given to RN and Post -op Vital signs reviewed and stable  Post vital signs: Reviewed and stable  Last Vitals:  Vitals:   11/07/17 0723 11/07/17 0800  BP: (!) 153/83 132/80  Resp: 18   Temp: 36.7 C   SpO2: 98% 99%    Last Pain:  Vitals:   11/07/17 0723  TempSrc: Oral      Patients Stated Pain Goal: 8 (25/05/39 7673)  Complications: No apparent anesthesia complications

## 2017-11-07 NOTE — Anesthesia Procedure Notes (Signed)
Procedure Name: MAC Date/Time: 11/07/2017 8:01 AM Performed by: Vista Deck, CRNA Pre-anesthesia Checklist: Patient identified, Emergency Drugs available, Suction available, Timeout performed and Patient being monitored Patient Re-evaluated:Patient Re-evaluated prior to induction Oxygen Delivery Method: Non-rebreather mask

## 2017-11-07 NOTE — Discharge Instructions (Signed)
Gastroesophageal Reflux Disease, Adult Normally, food travels down the esophagus and stays in the stomach to be digested. If a person has gastroesophageal reflux disease (GERD), food and stomach acid move back up into the esophagus. When this happens, the esophagus becomes sore and swollen (inflamed). Over time, GERD can make small holes (ulcers) in the lining of the esophagus. Follow these instructions at home: Diet Follow a diet as told by your doctor. You may need to avoid foods and drinks such as: Coffee and tea (with or without caffeine). Drinks that contain alcohol. Energy drinks and sports drinks. Carbonated drinks or sodas. Chocolate and cocoa. Peppermint and mint flavorings. Garlic and onions. Horseradish. Spicy and acidic foods, such as peppers, chili powder, curry powder, vinegar, hot sauces, and BBQ sauce. Citrus fruit juices and citrus fruits, such as oranges, lemons, and limes. Tomato-based foods, such as red sauce, chili, salsa, and pizza with red sauce. Fried and fatty foods, such as donuts, french fries, potato chips, and high-fat dressings. High-fat meats, such as hot dogs, rib eye steak, sausage, ham, and bacon. High-fat dairy items, such as whole milk, butter, and cream cheese. Eat small meals often. Avoid eating large meals. Avoid drinking large amounts of liquid with your meals. Avoid eating meals during the 2-3 hours before bedtime. Avoid lying down right after you eat. Do not exercise right after you eat. General instructions Pay attention to any changes in your symptoms. Take over-the-counter and prescription medicines only as told by your doctor. Do not take aspirin, ibuprofen, or other NSAIDs unless your doctor says it is okay. Do not use any tobacco products, including cigarettes, chewing tobacco, and e-cigarettes. If you need help quitting, ask your doctor. Wear loose clothes. Do not wear anything tight around your waist. Raise (elevate) the head of your bed  about 6 inches (15 cm). Try to lower your stress. If you need help doing this, ask your doctor. If you are overweight, lose an amount of weight that is healthy for you. Ask your doctor about a safe weight loss goal. Keep all follow-up visits as told by your doctor. This is important. Contact a doctor if: You have new symptoms. You lose weight and you do not know why it is happening. You have trouble swallowing, or it hurts to swallow. You have wheezing or a cough that keeps happening. Your symptoms do not get better with treatment. You have a hoarse voice. Get help right away if: You have pain in your arms, neck, jaw, teeth, or back. You feel sweaty, dizzy, or light-headed. You have chest pain or shortness of breath. You throw up (vomit) and your throw up looks like blood or coffee grounds. You pass out (faint). Your poop (stool) is bloody or black. You cannot swallow, drink, or eat. This information is not intended to replace advice given to you by your health care provider. Make sure you discuss any questions you have with your health care provider. Document Released: 05/24/2008 Document Revised: 05/13/2016 Document Reviewed: 04/02/2015 Elsevier Interactive Patient Education  2018 [employer]. EGD Discharge instructions Please read the instructions outlined below and refer to this sheet in the next few weeks. These discharge instructions provide you with general information on caring for yourself after you leave the hospital. Your doctor may also give you specific instructions. While your treatment has been planned according to the most current medical practices available, unavoidable complications occasionally occur. If you have any problems or questions after discharge, please call your doctor. ACTIVITY  You  may resume your regular activity but move at a slower pace for the next 24 hours.   Take frequent rest periods for the next 24 hours.   Walking will help expel (get rid of)  the air and reduce the bloated feeling in your abdomen.   No driving for 24 hours (because of the anesthesia (medicine) used during the test).   You may shower.   Do not sign any important legal documents or operate any machinery for 24 hours (because of the anesthesia used during the test).  NUTRITION  Drink plenty of fluids.   You may resume your normal diet.   Begin with a light meal and progress to your normal diet.   Avoid alcoholic beverages for 24 hours or as instructed by your caregiver.  MEDICATIONS  You may resume your normal medications unless your caregiver tells you otherwise.  WHAT YOU CAN EXPECT TODAY  You may experience abdominal discomfort such as a feeling of fullness or gas pains.  FOLLOW-UP  Your doctor will discuss the results of your test with you.  SEEK IMMEDIATE MEDICAL ATTENTION IF ANY OF THE FOLLOWING OCCUR:  Excessive nausea (feeling sick to your stomach) and/or vomiting.   Severe abdominal pain and distention (swelling).   Trouble swallowing.   Temperature over 101 F (37.8 C).   Rectal bleeding or vomiting of blood.    GERD information provided  Continue Protonix 40 mg daily  Try to lose another 15-20 pounds in the next 6 months  Office visit with [place] in 3 months.

## 2017-11-07 NOTE — Op Note (Signed)
[facility] Patient Name: [name] Procedure Date: 11/07/2017 8:03 AM MRN: [id number] Date of Birth: Jan 06, 1951 Attending MD: Norvel Richards , MD CSN: [id number] Age: 66 Admit Type: Outpatient Procedure:                Upper GI endoscopy Indications:              Nausea Providers:                Norvel Richards, MD, Hinton Rao, RN,                            Randa Spike, Technician Referring MD:             Crissie Sickles. Howard Medicines:                Propofol per Anesthesia Complications:            No immediate complications. Estimated Blood Loss:     Estimated blood loss: none. Procedure:                Pre-Anesthesia Assessment:                           - Prior to the procedure, a History and Physical                            was performed, and patient medications and                            allergies were reviewed. The patient's tolerance of                            previous anesthesia was also reviewed. The risks                            and benefits of the procedure and the sedation                            options and risks were discussed with the patient.                            All questions were answered, and informed consent                            was obtained. Prior Anticoagulants: The patient has                            taken no previous anticoagulant or antiplatelet                            agents. ASA Grade Assessment: II - A patient with                            mild systemic disease. After reviewing the risks  and benefits, the patient was deemed in                            satisfactory condition to undergo the procedure.                           After obtaining informed consent, the endoscope was                            passed under direct vision. Throughout the                            procedure, the patient's blood pressure, pulse, and                            oxygen saturations  were monitored continuously. The                            [id number]) was introduced through the and                            advanced to the second part of duodenum. The upper                            GI endoscopy was accomplished without difficulty.                            The patient tolerated the procedure well. The upper                            GI endoscopy was accomplished without difficulty.                            The patient tolerated the procedure well. Scope In: 8:12:01 AM Scope Out: 8:15:42 AM Total Procedure Duration: 0 hours 3 minutes 41 seconds  Findings:      The examined esophagus was normal.      The entire examined stomach was normal. Biopsies were taken with a cold       forceps for histology.      The duodenal bulb and second portion of the duodenum were normal.       Estimated blood loss: none. Impression:               - Normal esophagus.                           - Normal stomach. Biopsied.                           - Normal duodenal bulb and second portion of the                            duodenum. Moderate Sedation:      Moderate (conscious) sedation was personally administered by an       anesthesia professional. The following parameters were monitored: oxygen  saturation, heart rate, blood pressure, respiratory rate, EKG, adequacy       of pulmonary ventilation, and response to care. Total physician       intraservice time was 11 minutes. Recommendation:           - Patient has a contact number available for                            emergencies. The signs and symptoms of potential                            delayed complications were discussed with the                            patient. Return to normal activities tomorrow.                            Written discharge instructions were provided to the                            patient.                           - Resume previous diet. Continue Protonix 40 mg                             daily.                           - Continue present medications. lose another 15-20                            pounds over the next 6 months as feasible                           - No repeat upper endoscopy.                           - Return to GI office in 3 months. Procedure Code(s):        --- Professional ---                           [phone], Esophagogastroduodenoscopy, flexible,                            transoral; with biopsy, single or multiple Diagnosis Code(s):        --- Professional ---                           R11.0, Nausea CPT copyright 2016 American Medical Association. All rights reserved. The codes documented in this report are preliminary and upon coder review may  be revised to meet current compliance requirements. Cristopher Estimable. Zenaya Ulatowski, MD Norvel Richards, MD 11/07/2017 8:26:26 AM This report has been signed electronically. Number of Addenda: 0

## 2017-11-14 ENCOUNTER — Encounter (HOSPITAL_COMMUNITY): Payer: Self-pay | Admitting: Internal Medicine

## 2018-01-19 ENCOUNTER — Ambulatory Visit (INDEPENDENT_AMBULATORY_CARE_PROVIDER_SITE_OTHER): Payer: Medicare Other | Admitting: Otolaryngology

## 2018-01-19 DIAGNOSIS — R070 Pain in throat: Secondary | ICD-10-CM | POA: Diagnosis not present

## 2018-01-19 DIAGNOSIS — H9201 Otalgia, right ear: Secondary | ICD-10-CM

## 2018-02-07 ENCOUNTER — Ambulatory Visit: Payer: Medicare Other | Admitting: Gastroenterology

## 2018-02-20 ENCOUNTER — Ambulatory Visit (INDEPENDENT_AMBULATORY_CARE_PROVIDER_SITE_OTHER): Payer: Medicare Other | Admitting: Otolaryngology

## 2018-02-20 DIAGNOSIS — H9209 Otalgia, unspecified ear: Secondary | ICD-10-CM

## 2018-04-23 ENCOUNTER — Other Ambulatory Visit: Payer: Self-pay | Admitting: Gastroenterology

## 2018-09-12 ENCOUNTER — Encounter: Payer: Self-pay | Admitting: *Deleted

## 2018-09-12 DIAGNOSIS — F988 Other specified behavioral and emotional disorders with onset usually occurring in childhood and adolescence: Secondary | ICD-10-CM | POA: Insufficient documentation

## 2018-09-12 DIAGNOSIS — F319 Bipolar disorder, unspecified: Secondary | ICD-10-CM | POA: Insufficient documentation

## 2018-09-12 DIAGNOSIS — F339 Major depressive disorder, recurrent, unspecified: Secondary | ICD-10-CM | POA: Insufficient documentation

## 2018-09-27 ENCOUNTER — Telehealth: Payer: Self-pay | Admitting: Internal Medicine

## 2018-09-27 NOTE — Telephone Encounter (Signed)
Pt called wanting to speak with LSL or RMR. I told him everyone was with patients and I could take a message. He wanted to be transferred to VM.

## 2018-09-28 ENCOUNTER — Other Ambulatory Visit: Payer: Self-pay | Admitting: Psychiatry

## 2018-09-28 NOTE — Telephone Encounter (Signed)
LMOM, Returned pts call. Waiting on a return call.

## 2018-09-28 NOTE — Telephone Encounter (Signed)
Spoke with pt. He has felt nauseated the last 3-4 weeks most of the day. Pt eats but feels he doesn't have an appetite throughout the day. Pt is taking Pantoprazole qam before breakfast but when asked if he is still having some heartburn throughout the day, pt said yes. He is burping a lot and feels the heartburn most of the day. Pt isn't eating more fried foods or changed his eating habits.

## 2018-10-02 NOTE — Telephone Encounter (Signed)
Lm with spouse and on cell. Pt will call back

## 2018-10-02 NOTE — Telephone Encounter (Signed)
Would recommend increasing pantoprazole 40mg  to BID before breakfast and evening meal. Can send in RX for #60, 3 refills.   Can offer zofran for nausea, 4mg  up to tid prn nausea, #30, no refills.  Last seen over one year ago, needs ov to evaluate. RMR only as overdue to see RMR.

## 2018-10-03 ENCOUNTER — Other Ambulatory Visit: Payer: Self-pay

## 2018-10-03 MED ORDER — ONDANSETRON HCL 4 MG PO TABS: 4.0000 mg | ORAL_TABLET | Freq: Three times a day (TID) | ORAL | 0 refills | Status: DC

## 2018-10-03 MED ORDER — PANTOPRAZOLE SODIUM 40 MG PO TBEC: 40.0000 mg | DELAYED_RELEASE_TABLET | Freq: Two times a day (BID) | ORAL | 3 refills | Status: DC

## 2018-10-03 NOTE — Telephone Encounter (Signed)
Spoke with pt and he is aware that he needs to f/u with RMR since it's been a year since his last apt. Sent Rx Pantoprazole 40 my bid # 60 with 3 rfs, Zofran #30 with 0 rfs. Take up to three times daily for nausea.

## 2018-10-04 DIAGNOSIS — N3943 Post-void dribbling: Secondary | ICD-10-CM | POA: Insufficient documentation

## 2018-10-04 DIAGNOSIS — F419 Anxiety disorder, unspecified: Secondary | ICD-10-CM | POA: Insufficient documentation

## 2018-10-31 ENCOUNTER — Ambulatory Visit: Payer: Self-pay | Admitting: Psychiatry

## 2018-11-15 ENCOUNTER — Encounter: Payer: Self-pay | Admitting: Psychiatry

## 2018-11-15 ENCOUNTER — Ambulatory Visit: Payer: Medicare Other | Admitting: Psychiatry

## 2018-11-15 DIAGNOSIS — F331 Major depressive disorder, recurrent, moderate: Secondary | ICD-10-CM

## 2018-11-15 DIAGNOSIS — F902 Attention-deficit hyperactivity disorder, combined type: Secondary | ICD-10-CM | POA: Diagnosis not present

## 2018-11-15 DIAGNOSIS — F341 Dysthymic disorder: Secondary | ICD-10-CM | POA: Diagnosis not present

## 2018-11-15 DIAGNOSIS — Z630 Problems in relationship with spouse or partner: Secondary | ICD-10-CM | POA: Diagnosis not present

## 2018-11-15 MED ORDER — CARIPRAZINE HCL 1.5 MG PO CAPS: 1.5000 mg | ORAL_CAPSULE | Freq: Every day | ORAL | 1 refills | Status: DC

## 2018-11-15 NOTE — Progress Notes (Signed)
[name] [id number] [birth year] 67 y.o.  Subjective:   Patient ID:  [name] is a 67 y.o. (DOB 09/15/1951) male.  Chief Complaint:  Chief Complaint  Patient presents with  . Anxiety  . Depression  . ADHD    HPI [name] presents to the office today for follow-up of above plus sleep.  Sleep better with trazodone. Pt reports that mood is Anxious, Depressed, Dysphoric and Irritable and describes anxiety as Moderate. Anxiety symptoms include: Excessive Worry,. Pt reports no sleep issues. Pt reports that appetite is good. Pt reports that energy is lethargic and down slightly and loss of interest or pleasure in usual activities. Concentration is difficulty with focus and attention. Suicidal thoughts:  denied by patient.  Brain doesn't shut off.  Somatic worry despite normal PE lately.  Hard to push himself into hobbies too.  Bored in retirement and lonely at home.  W preoccupied and consumed with developmentally disabled son [name] and ignores pt.  Even harder time keeping [name] and managing his emotions about it.  Some rumination and guilt on his thoughts.   Review of Systems:  Review of Systems  Musculoskeletal: Positive for back pain.  Neurological: Negative for tremors and weakness.  Psychiatric/Behavioral: Negative for agitation, behavioral problems, confusion, decreased concentration, dysphoric mood, hallucinations, self-injury, sleep disturbance and suicidal ideas. The patient is nervous/anxious. The patient is not hyperactive.     Medications: I have reviewed the patient's current medications.  Current Outpatient Medications  Medication Sig Dispense Refill  . buPROPion (WELLBUTRIN XL) 150 MG 24 hr tablet Take 450 mg by mouth daily.    . dorzolamide-timolol (COSOPT) 22.3-6.8 MG/ML ophthalmic solution Place 1 drop into both eyes 2 (two) times daily.    Marland Kitchen lamoTRIgine (LAMICTAL) 200 MG tablet TAKE 1 TABLET BY MOUTH ONCE DAILY 90 tablet 0  . latanoprost (XALATAN)  0.005 % ophthalmic solution Place 1 drop into both eyes at bedtime.    . ondansetron (ZOFRAN) 4 MG tablet Take 1 tablet (4 mg total) by mouth 3 (three) times daily. 30 tablet 0  . pantoprazole (PROTONIX) 40 MG tablet Take 1 tablet (40 mg total) by mouth 2 (two) times daily. 60 tablet 3  . traZODone (DESYREL) 50 MG tablet Take 50 mg by mouth at bedtime.    . vitamin B-12 (CYANOCOBALAMIN) 500 MCG tablet Take 500 mcg every evening by mouth.    . ALPRAZolam (XANAX) 0.5 MG tablet Take 0.25 mg by mouth at bedtime. For anxiety or sleep    . brimonidine (ALPHAGAN) 0.2 % ophthalmic solution Place 1 drop into both eyes 2 (two) times daily.     . cariprazine ([name]) capsule Take 1 capsule (1.5 mg total) by mouth daily. 30 capsule 1  . diphenhydramine-acetaminophen (TYLENOL PM) 25-500 MG TABS tablet Take 1 tablet at bedtime as needed by mouth (SLEEP).     No current facility-administered medications for this visit.     Medication Side Effects: None  Allergies:  Allergies  Allergen Reactions  . Ambien [Zolpidem Tartrate]     Sleep walks  . Bactrim [Sulfamethoxazole-Trimethoprim] Hives and Swelling  . Dilaudid [Hydromorphone Hcl] Other (See Comments)    Made blood pressure drop with colon resection surgery in 2007   . Morphine And Related Nausea And Vomiting    Past Medical History:  Diagnosis Date  . Anemia    hx of   . Arthritis    fingers   . BPH (benign prostatic hyperplasia)   . Colon cancer ([place])  stage II, diagnosed 2007  . Complication of anesthesia    vagal response after umbilcal hernia at [place]  . Depression   . Diabetes mellitus    no as of 09/08/16 due to weight loss   . GERD (gastroesophageal reflux disease)   . Glaucoma 2012  . History of kidney stones   . Sleep apnea    wears CiPaP at night  . Staph infection 2008   left side of face    Family History  Problem Relation Age of Onset  . Lung cancer Father   . Diabetes Mother   . Anesthesia problems Neg Hx    . Hypotension Neg Hx   . Malignant hyperthermia Neg Hx   . Pseudochol deficiency Neg Hx     Social History   Socioeconomic History  . Marital status: Married    Spouse name: Not on file  . Number of children: 1  . Years of education: Not on file  . Highest education level: Not on file  Occupational History  . Occupation: school system    Employer: [employer]  . Financial resource strain: Not on file  . Food insecurity:    Worry: Not on file    Inability: Not on file  . Transportation needs:    Medical: Not on file    Non-medical: Not on file  Tobacco Use  . Smoking status: Never Smoker  . Smokeless tobacco: Never Used  . Tobacco comment: Never smoked  Substance and Sexual Activity  . Alcohol use: No    Alcohol/week: 0.0 standard drinks  . Drug use: No  . Sexual activity: Yes    Birth control/protection: None  Lifestyle  . Physical activity:    Days per week: Not on file    Minutes per session: Not on file  . Stress: Not on file  Relationships  . Social connections:    Talks on phone: Not on file    Gets together: Not on file    Attends religious service: Not on file    Active member of club or organization: Not on file    Attends meetings of clubs or organizations: Not on file    Relationship status: Not on file  . Intimate partner violence:    Fear of current or ex partner: Not on file    Emotionally abused: Not on file    Physically abused: Not on file    Forced sexual activity: Not on file  Other Topics Concern  . Not on file  Social History Narrative  . Not on file    Past Medical History, Surgical history, Social history, and Family history were reviewed and updated as appropriate.   Please see review of systems for further details on the patient's review from today.   Objective:   Physical Exam:  There were no vitals taken for this visit.  Physical Exam  Constitutional: He is oriented to person, place, and time. He  appears well-developed. No distress.  Musculoskeletal: He exhibits no deformity.  Neurological: He is alert and oriented to person, place, and time. He displays no tremor. Coordination and gait normal.  Psychiatric: He has a normal mood and affect. His speech is normal. Judgment and thought content normal. His mood appears not anxious. His affect is not angry, not blunt, not labile and not inappropriate. He is hyperactive. Thought content is not paranoid. Cognition and memory are normal. He does not exhibit a depressed mood. He expresses no homicidal and no  suicidal ideation. He expresses no suicidal plans and no homicidal plans.  Insight intact. No auditory or visual hallucinations. No delusions.  Hyper style chronically. He is inattentive.    Lab Review:     Component Value Date/Time   NA 135 11/01/2017 1435   K 3.7 11/01/2017 1435   CL 103 11/01/2017 1435   CO2 24 11/01/2017 1435   GLUCOSE 83 11/01/2017 1435   BUN 21 (H) 11/01/2017 1435   CREATININE 0.94 11/01/2017 1435   CALCIUM 9.1 11/01/2017 1435   PROT 7.3 02/25/2017 1407   ALBUMIN 4.1 02/25/2017 1407   AST 32 02/25/2017 1407   ALT 33 02/25/2017 1407   ALKPHOS 91 02/25/2017 1407   BILITOT 0.5 02/25/2017 1407   GFRNONAA >60 11/01/2017 1435   GFRAA >60 11/01/2017 1435       Component Value Date/Time   WBC 5.9 11/01/2017 1435   RBC 4.86 11/01/2017 1435   HGB 15.0 11/01/2017 1435   HCT 43.1 11/01/2017 1435   PLT 179 11/01/2017 1435   MCV 88.7 11/01/2017 1435   MCH 30.9 11/01/2017 1435   MCHC 34.8 11/01/2017 1435   RDW 13.0 11/01/2017 1435   LYMPHSABS 1.5 11/01/2017 1435   MONOABS 0.7 11/01/2017 1435   EOSABS 0.1 11/01/2017 1435   BASOSABS 0.0 11/01/2017 1435    No results found for: POCLITH, LITHIUM   No results found for: PHENYTOIN, PHENOBARB, VALPROATE, CBMZ   Normal stress test.  No exercise.  Won't stick with diet. .res Assessment: Plan:    Major depressive disorder, recurrent episode, moderate  (HCC)  Dysthymia  Attention deficit hyperactivity disorder (ADHD), combined type  Marital dysfunction   Supportive therapy on problems with home life.  Trying to motivate for the hobbies.    Worse off [name] and seasonlaly.  Per the chart [name] helped before and he stopped it on his own.  He appears worse off of it.  Discussed side effects in detail restart [name] 1.5 mg QOD.  Start samples. He agrees.  Discussed potential metabolic side effects associated with atypical antipsychotics, as well as potential risk for movement side effects. Advised pt to contact office if movement side effects occur.  It's approved until year-end.  Chronic attention problems remain and he is failed multiple ADD meds including off label things such as modafinil and Nuvigil  This appt was 30 mins.  FU 3 mos  Lynder Parents, MD, DFAPA   Please see After Visit Summary for patient specific instructions.  No future appointments.  No orders of the defined types were placed in this encounter.     -------------------------------

## 2018-12-04 ENCOUNTER — Ambulatory Visit (INDEPENDENT_AMBULATORY_CARE_PROVIDER_SITE_OTHER): Payer: Medicare Other | Admitting: Otolaryngology

## 2018-12-04 DIAGNOSIS — J343 Hypertrophy of nasal turbinates: Secondary | ICD-10-CM

## 2018-12-04 DIAGNOSIS — J310 Chronic rhinitis: Secondary | ICD-10-CM

## 2018-12-04 DIAGNOSIS — H903 Sensorineural hearing loss, bilateral: Secondary | ICD-10-CM | POA: Diagnosis not present

## 2018-12-04 DIAGNOSIS — H6981 Other specified disorders of Eustachian tube, right ear: Secondary | ICD-10-CM

## 2018-12-11 ENCOUNTER — Other Ambulatory Visit: Payer: Self-pay | Admitting: Psychiatry

## 2018-12-15 ENCOUNTER — Telehealth: Payer: Self-pay

## 2018-12-15 NOTE — Telephone Encounter (Signed)
Prior authorization approved for [name] 1.5 mg through 12/20/2019. [id number]

## 2018-12-22 ENCOUNTER — Ambulatory Visit: Payer: Medicare Other | Admitting: Urology

## 2018-12-22 DIAGNOSIS — N401 Enlarged prostate with lower urinary tract symptoms: Secondary | ICD-10-CM

## 2018-12-22 DIAGNOSIS — N393 Stress incontinence (female) (male): Secondary | ICD-10-CM | POA: Diagnosis not present

## 2019-01-10 ENCOUNTER — Other Ambulatory Visit: Payer: Self-pay | Admitting: Gastroenterology

## 2019-02-12 ENCOUNTER — Encounter: Payer: Self-pay | Admitting: Psychiatry

## 2019-02-12 ENCOUNTER — Ambulatory Visit: Payer: Medicare Other | Admitting: Psychiatry

## 2019-02-12 DIAGNOSIS — F902 Attention-deficit hyperactivity disorder, combined type: Secondary | ICD-10-CM

## 2019-02-12 DIAGNOSIS — F331 Major depressive disorder, recurrent, moderate: Secondary | ICD-10-CM

## 2019-02-12 DIAGNOSIS — F341 Dysthymic disorder: Secondary | ICD-10-CM | POA: Diagnosis not present

## 2019-02-12 MED ORDER — TRAZODONE HCL 50 MG PO TABS: 50.0000 mg | ORAL_TABLET | Freq: Every evening | ORAL | 3 refills | Status: DC | PRN

## 2019-02-12 MED ORDER — BUPROPION HCL ER (XL) 150 MG PO TB24: 450.0000 mg | ORAL_TABLET | Freq: Every morning | ORAL | 3 refills | Status: DC

## 2019-02-12 MED ORDER — LAMOTRIGINE 200 MG PO TABS
200.0000 mg | ORAL_TABLET | Freq: Every day | ORAL | 3 refills | Status: DC
Start: 2019-02-12 — End: 2020-05-01

## 2019-02-12 NOTE — Progress Notes (Signed)
[name] [id number] 1951-02-23 68 y.o.  Subjective:   Patient ID:  [name] is a 68 y.o. (DOB 06/23/1951) male.  Chief Complaint:  Chief Complaint  Patient presents with  . Follow-up    Medication Management  . Stress  . Depression   Last seen November 2019 HPI NASON CONRADT presents to the office today for follow-up of above plus sleep.    At his last visit his mood was more depressed anxious and irritable.  This seemed to correlate with discontinuing [name] so the [name] was restarted at 1.5 mg every other day.  Has been compliant.  I stay stressed out all the time. Can be sad at home.  Feels distracted driving.  Bright lights at night bother him driving.  Wellbutrin has not helped the weight.  Asked questions about the Lamotrigine. He's not sure the difference the [name] made bc he doesn't remember.  Feels somewhat edgy.    Still worries all the time.  Dwells on negative things.  1 cup coffee and a drink a day.  Trazodone still helps sleep.    Sleep better with trazodone. Pt reports that mood is Anxious, Depressed, Dysphoric and Irritable and describes anxiety as Moderate. Anxiety symptoms include: Excessive Worry,. Pt reports no sleep issues. Pt reports that appetite is good. Pt reports that energy is lethargic and down slightly and loss of interest or pleasure in usual activities. Concentration is difficulty with focus and attention. Suicidal thoughts:  denied by patient.  Brain doesn't shut off.  Somatic worry despite normal PE lately.  Hard to push himself into hobbies too.  Unselttl;ed.  Bored in retirement and lonely at home.  W preoccupied and consumed with developmentally disabled son [name] and ignores pt.  Even harder time keeping [name] and managing his emotions about it.  Some rumination and guilt on his thoughts.  Past Psychiatric Medication Trials:  Review of Systems:  Review of Systems  Musculoskeletal: Positive for back pain.  Neurological:  Negative for tremors and weakness.  Psychiatric/Behavioral: Positive for dysphoric mood. Negative for agitation, behavioral problems, confusion, decreased concentration, hallucinations, self-injury, sleep disturbance and suicidal ideas. The patient is nervous/anxious. The patient is not hyperactive.     Medications: I have reviewed the patient's current medications.  Current Outpatient Medications  Medication Sig Dispense Refill  . brimonidine (ALPHAGAN) 0.2 % ophthalmic solution Place 1 drop into both eyes 2 (two) times daily.     Marland Kitchen buPROPion (WELLBUTRIN XL) 150 MG 24 hr tablet TAKE 3 TABLETS BY MOUTH EVERY MORNING 270 tablet 0  . cariprazine ([name]) capsule Take 1 capsule (1.5 mg total) by mouth daily. (Patient taking differently: Take 1.5 mg by mouth every other day. ) 30 capsule 1  . dorzolamide-timolol (COSOPT) 22.3-6.8 MG/ML ophthalmic solution Place 1 drop into both eyes 2 (two) times daily.    . fluticasone (FLONASE) 50 MCG/ACT nasal spray SHAKE LQ AND U 2 SPRAYS IEN D  2  . lamoTRIgine (LAMICTAL) 200 MG tablet TAKE 1 TABLET BY MOUTH EVERY DAY 90 tablet 0  . latanoprost (XALATAN) 0.005 % ophthalmic solution Place 1 drop into both eyes at bedtime.    . metFORMIN (GLUCOPHAGE-XR) 500 MG 24 hr tablet Take by mouth.    . Multiple Vitamins-Minerals (THERA-M) TABS Take by mouth.    . pantoprazole (PROTONIX) 40 MG tablet TAKE 1 TABLET(40 MG) BY MOUTH TWICE DAILY 60 tablet 5  . traZODone (DESYREL) 50 MG tablet TAKE 1-2 TABLETS AT BEDTIME AS NEEDED FOR INSOMNIA  180 tablet 0  . vitamin B-12 (CYANOCOBALAMIN) 500 MCG tablet Take 500 mcg every evening by mouth.     No current facility-administered medications for this visit.     Medication Side Effects: None  Allergies:  Allergies  Allergen Reactions  . Ambien [Zolpidem Tartrate]     Sleep walks  . Bactrim [Sulfamethoxazole-Trimethoprim] Hives and Swelling  . Dilaudid [Hydromorphone Hcl] Other (See Comments)    Made blood pressure drop  with colon resection surgery in 2007   . Morphine And Related Nausea And Vomiting    Past Medical History:  Diagnosis Date  . Anemia    hx of   . Arthritis    fingers   . BPH (benign prostatic hyperplasia)   . Colon cancer ([place])    stage II, diagnosed 2007  . Complication of anesthesia    vagal response after umbilcal hernia at [facility]  . Depression   . Diabetes mellitus    no as of 09/08/16 due to weight loss   . GERD (gastroesophageal reflux disease)   . Glaucoma 2012  . History of kidney stones   . Sleep apnea    wears CiPaP at night  . Staph infection 2008   left side of face    Family History  Problem Relation Age of Onset  . Lung cancer Father   . Diabetes Mother   . Anesthesia problems Neg Hx   . Hypotension Neg Hx   . Malignant hyperthermia Neg Hx   . Pseudochol deficiency Neg Hx     Social History   Socioeconomic History  . Marital status: Married    Spouse name: Not on file  . Number of children: 1  . Years of education: Not on file  . Highest education level: Not on file  Occupational History  . Occupation: school system    Employer: [employer]  . Financial resource strain: Not on file  . Food insecurity:    Worry: Not on file    Inability: Not on file  . Transportation needs:    Medical: Not on file    Non-medical: Not on file  Tobacco Use  . Smoking status: Never Smoker  . Smokeless tobacco: Never Used  . Tobacco comment: Never smoked  Substance and Sexual Activity  . Alcohol use: No    Alcohol/week: 0.0 standard drinks  . Drug use: No  . Sexual activity: Yes    Birth control/protection: None  Lifestyle  . Physical activity:    Days per week: Not on file    Minutes per session: Not on file  . Stress: Not on file  Relationships  . Social connections:    Talks on phone: Not on file    Gets together: Not on file    Attends religious service: Not on file    Active member of club or organization: Not on file     Attends meetings of clubs or organizations: Not on file    Relationship status: Not on file  . Intimate partner violence:    Fear of current or ex partner: Not on file    Emotionally abused: Not on file    Physically abused: Not on file    Forced sexual activity: Not on file  Other Topics Concern  . Not on file  Social History Narrative  . Not on file    Past Medical History, Surgical history, Social history, and Family history were reviewed and updated as appropriate.   Please see  review of systems for further details on the patient's review from today.   Objective:   Physical Exam:  There were no vitals taken for this visit.  Physical Exam Constitutional:      General: He is not in acute distress.    Appearance: He is well-developed.  Musculoskeletal:        General: No deformity.  Neurological:     Mental Status: He is alert and oriented to person, place, and time.     Motor: No tremor.     Coordination: Coordination normal.     Gait: Gait normal.  Psychiatric:        Attention and Perception: He is inattentive.        Mood and Affect: Mood is anxious and depressed. Affect is not labile, blunt, angry or inappropriate.        Speech: Speech normal.        Behavior: Behavior is hyperactive.        Thought Content: Thought content normal. Thought content is not paranoid. Thought content does not include homicidal or suicidal ideation. Thought content does not include homicidal or suicidal plan.        Judgment: Judgment normal.     Comments: Insight intact. No auditory or visual hallucinations. No delusions.  Hyper style chronically.     Lab Review:     Component Value Date/Time   NA 135 11/01/2017 1435   K 3.7 11/01/2017 1435   CL 103 11/01/2017 1435   CO2 24 11/01/2017 1435   GLUCOSE 83 11/01/2017 1435   BUN 21 (H) 11/01/2017 1435   CREATININE 0.94 11/01/2017 1435   CALCIUM 9.1 11/01/2017 1435   PROT 7.3 02/25/2017 1407   ALBUMIN 4.1 02/25/2017 1407   AST  32 02/25/2017 1407   ALT 33 02/25/2017 1407   ALKPHOS 91 02/25/2017 1407   BILITOT 0.5 02/25/2017 1407   GFRNONAA >60 11/01/2017 1435   GFRAA >60 11/01/2017 1435       Component Value Date/Time   WBC 5.9 11/01/2017 1435   RBC 4.86 11/01/2017 1435   HGB 15.0 11/01/2017 1435   HCT 43.1 11/01/2017 1435   PLT 179 11/01/2017 1435   MCV 88.7 11/01/2017 1435   MCH 30.9 11/01/2017 1435   MCHC 34.8 11/01/2017 1435   RDW 13.0 11/01/2017 1435   LYMPHSABS 1.5 11/01/2017 1435   MONOABS 0.7 11/01/2017 1435   EOSABS 0.1 11/01/2017 1435   BASOSABS 0.0 11/01/2017 1435    No results found for: POCLITH, LITHIUM   No results found for: PHENYTOIN, PHENOBARB, VALPROATE, CBMZ   Normal stress test.  No exercise.  Won't stick with diet. .res Assessment: Plan:    Major depressive disorder, recurrent episode, moderate (HCC)  Dysthymia  Attention deficit hyperactivity disorder (ADHD), combined type   Supportive therapy on problems with home life.  Trying to motivate for the hobbies.    Worse off [name] and seasonlly.  Per the chart [name] helped before and he stopped it on his own.  He appears worse off of it.  Discussed side effects in detail restart [name] 1.5 mg QOD.  Start samples. He agrees.  Discussed potential metabolic side effects associated with atypical antipsychotics, as well as potential risk for movement side effects. Advised pt to contact office if movement side effects occur.  It's approved until year-end.  Trial reduction Wellbutrin to 300 to see if he feels less edgy.  Chronic attention problems remain and he is failed multiple ADD meds including off label  things such as modafinil and Nuvigil  This appt was 30 mins.  FU 3 mos  Lynder Parents, MD, DFAPA   Please see After Visit Summary for patient specific instructions.  No future appointments.  No orders of the defined types were placed in this encounter.     -------------------------------

## 2019-02-12 NOTE — Patient Instructions (Signed)
Reduce the Wellbutrin to 2 daily to see if you feel less edgy.  If anything is worse return to 3 daily.

## 2019-04-06 ENCOUNTER — Other Ambulatory Visit: Payer: Self-pay | Admitting: Psychiatry

## 2019-05-08 ENCOUNTER — Encounter: Payer: Self-pay | Admitting: Psychiatry

## 2019-05-08 ENCOUNTER — Ambulatory Visit (INDEPENDENT_AMBULATORY_CARE_PROVIDER_SITE_OTHER): Payer: Medicare Other | Admitting: Psychiatry

## 2019-05-08 ENCOUNTER — Other Ambulatory Visit: Payer: Self-pay

## 2019-05-08 DIAGNOSIS — F331 Major depressive disorder, recurrent, moderate: Secondary | ICD-10-CM | POA: Diagnosis not present

## 2019-05-08 DIAGNOSIS — Z630 Problems in relationship with spouse or partner: Secondary | ICD-10-CM | POA: Diagnosis not present

## 2019-05-08 DIAGNOSIS — F341 Dysthymic disorder: Secondary | ICD-10-CM

## 2019-05-08 DIAGNOSIS — F902 Attention-deficit hyperactivity disorder, combined type: Secondary | ICD-10-CM

## 2019-05-08 NOTE — Progress Notes (Signed)
[name] [id number] 10/07/1951 68 y.o.   Virtual Visit via Telephone Note  I connected with pt by telephone and verified that I am speaking with the correct person using two identifiers.   I discussed the limitations, risks, security and privacy concerns of performing an evaluation and management service by telephone and the availability of in person appointments. I also discussed with the patient that there may be a patient responsible charge related to this service. The patient expressed understanding and agreed to proceed.  I discussed the assessment and treatment plan with the patient. The patient was provided an opportunity to ask questions and all were answered. The patient agreed with the plan and demonstrated an understanding of the instructions.   The patient was advised to call back or seek an in-person evaluation if the symptoms worsen or if the condition fails to improve as anticipated.  I provided 25 minutes of non-face-to-face time during this encounter. The call started at 2:00 and ended at 225. The patient was located at home and the provider was located office.   Subjective:   Patient ID:  [name] is a 68 y.o. (DOB [birth year]) male.  Chief Complaint:  Chief Complaint  Patient presents with  . Follow-up    Medication Management  . Depression    Medication Management    Depression         Associated symptoms include no decreased concentration and no suicidal ideas.  [name] presents for follow-up of depression and medication changes.  His last visit was February 12, 2019.  He had become more depressed off of the [name] and it was recommended he restart [name] 1.5 mg daily.  He also had previously felt edgy and so we reduced the Wellbutrin from 450 to 300 mg daily in hopes of cutting down on that edgy feeling.  He felt edgier after reducing the Wellbutrin to 300 so increased it back.  Still edgy with stress.  Trazodone helps sleep.  Fine if busy  and hard to be alone at home.  Has been exercising and dieting and losing weight.  Not a lot of aruguing with wife except over Covid 19.  Never took the [name] 1.5 mg daily.  No SE from it so far.  Has been compliant.  I stay stressed out all the time.    Still worries all the time.  Dwells on negative things.  Chronic conflict with wife especially over their disabled son [name]  1 cup coffee and a drink a day.  Trazodone still helps sleep.    Sleep better with trazodone. Pt reports that mood is Anxious, Depressed, Dysphoric and Irritable and describes anxiety as Moderate. Anxiety symptoms include: Excessive Worry,. Pt reports no sleep issues. Pt reports that appetite is good. Pt reports that energy is lethargic and down slightly and loss of interest or pleasure in usual activities. Concentration is difficulty with focus and attention. Suicidal thoughts:  denied by patient.  Brain doesn't shut off.  Somatic worry despite normal PE lately.  Hard to push himself into hobbies too.  Unselttl;ed.  Bored in retirement and lonely at home.  W preoccupied and consumed with developmentally disabled son [name] and ignores pt.  Even harder time keeping [name] and managing his emotions about it.  Some rumination and guilt on his thoughts.  Past Psychiatric Medication Trials: [name] 1.5, trazodone, Abilify side effects in 2012, risperidone, Pristiq, paroxetine, Nuvigil, lithium, amitriptyline, lamotrigine, Ambien cognitive side effects, Lunesta cognitive side effects, Nuvigil, venlafaxine,  Concerta, nortriptyline, as Evekeo, duloxetine, Xanax, buspirone  Review of Systems:  Review of Systems  Musculoskeletal: Positive for back pain.  Neurological: Negative for tremors and weakness.  Psychiatric/Behavioral: Positive for depression and dysphoric mood. Negative for agitation, behavioral problems, confusion, decreased concentration, hallucinations, self-injury, sleep disturbance and suicidal ideas. The patient is  nervous/anxious. The patient is not hyperactive.     Medications: I have reviewed the patient's current medications.  Current Outpatient Medications  Medication Sig Dispense Refill  . brimonidine (ALPHAGAN) 0.2 % ophthalmic solution Place 1 drop into both eyes 2 (two) times daily.     Marland Kitchen buPROPion (WELLBUTRIN XL) 150 MG 24 hr tablet Take 3 tablets (450 mg total) by mouth every morning. 270 tablet 3  . dorzolamide-timolol (COSOPT) 22.3-6.8 MG/ML ophthalmic solution Place 1 drop into both eyes 2 (two) times daily.    . fluticasone (FLONASE) 50 MCG/ACT nasal spray SHAKE LQ AND U 2 SPRAYS IEN D  2  . lamoTRIgine (LAMICTAL) 200 MG tablet Take 1 tablet (200 mg total) by mouth daily. 90 tablet 3  . latanoprost (XALATAN) 0.005 % ophthalmic solution Place 1 drop into both eyes at bedtime.    . metFORMIN (GLUCOPHAGE-XR) 500 MG 24 hr tablet Take 500 mg by mouth daily with breakfast.     . pantoprazole (PROTONIX) 40 MG tablet TAKE 1 TABLET(40 MG) BY MOUTH TWICE DAILY 60 tablet 5  . traZODone (DESYREL) 50 MG tablet Take 1-2 tablets (50-100 mg total) by mouth at bedtime as needed for sleep (insomnia). 180 tablet 3  . vitamin B-12 (CYANOCOBALAMIN) 500 MCG tablet Take 500 mcg every evening by mouth.    Arman Filter capsule TAKE 1 CAPSULE(1.5 MG) BY MOUTH DAILY (Patient taking differently: Take 1.5 mg by mouth every other day. ) 30 capsule 1   No current facility-administered medications for this visit.     Medication Side Effects: None  Allergies:  Allergies  Allergen Reactions  . Ambien [Zolpidem Tartrate]     Sleep walks  . Bactrim [Sulfamethoxazole-Trimethoprim] Hives and Swelling  . Dilaudid [Hydromorphone Hcl] Other (See Comments)    Made blood pressure drop with colon resection surgery in 2007   . Morphine And Related Nausea And Vomiting    Past Medical History:  Diagnosis Date  . Anemia    hx of   . Arthritis    fingers   . BPH (benign prostatic hyperplasia)   . Colon cancer ([place])     stage II, diagnosed 2007  . Complication of anesthesia    vagal response after umbilcal hernia at [facility]  . Depression   . Diabetes mellitus    no as of 09/08/16 due to weight loss   . GERD (gastroesophageal reflux disease)   . Glaucoma 2012  . History of kidney stones   . Sleep apnea    wears CiPaP at night  . Staph infection 2008   left side of face    Family History  Problem Relation Age of Onset  . Lung cancer Father   . Diabetes Mother   . Anesthesia problems Neg Hx   . Hypotension Neg Hx   . Malignant hyperthermia Neg Hx   . Pseudochol deficiency Neg Hx     Social History   Socioeconomic History  . Marital status: Married    Spouse name: Not on file  . Number of children: 1  . Years of education: Not on file  . Highest education level: Not on file  Occupational History  . Occupation: school  system    Employer: [employer]  Social Needs  . Financial resource strain: Not on file  . Food insecurity:    Worry: Not on file    Inability: Not on file  . Transportation needs:    Medical: Not on file    Non-medical: Not on file  Tobacco Use  . Smoking status: Never Smoker  . Smokeless tobacco: Never Used  . Tobacco comment: Never smoked  Substance and Sexual Activity  . Alcohol use: No    Alcohol/week: 0.0 standard drinks  . Drug use: No  . Sexual activity: Yes    Birth control/protection: None  Lifestyle  . Physical activity:    Days per week: Not on file    Minutes per session: Not on file  . Stress: Not on file  Relationships  . Social connections:    Talks on phone: Not on file    Gets together: Not on file    Attends religious service: Not on file    Active member of club or organization: Not on file    Attends meetings of clubs or organizations: Not on file    Relationship status: Not on file  . Intimate partner violence:    Fear of current or ex partner: Not on file    Emotionally abused: Not on file    Physically abused: Not on file     Forced sexual activity: Not on file  Other Topics Concern  . Not on file  Social History Narrative  . Not on file    Past Medical History, Surgical history, Social history, and Family history were reviewed and updated as appropriate.   Please see review of systems for further details on the patient's review from today.   Objective:   Physical Exam:  There were no vitals taken for this visit.  Physical Exam Neurological:     Mental Status: He is alert and oriented to person, place, and time.     Cranial Nerves: No dysarthria.  Psychiatric:        Attention and Perception: Attention normal.        Mood and Affect: Mood is anxious and depressed.        Speech: Speech normal.        Behavior: Behavior is cooperative.        Thought Content: Thought content normal. Thought content is not paranoid or delusional. Thought content does not include homicidal or suicidal ideation. Thought content does not include homicidal or suicidal plan.        Cognition and Memory: Cognition and memory normal.        Judgment: Judgment normal.     Comments: Improved with [name] but still somewhat edgy and anxious and tendency to be irritable.     Lab Review:     Component Value Date/Time   NA 135 11/01/2017 1435   K 3.7 11/01/2017 1435   CL 103 11/01/2017 1435   CO2 24 11/01/2017 1435   GLUCOSE 83 11/01/2017 1435   BUN 21 (H) 11/01/2017 1435   CREATININE 0.94 11/01/2017 1435   CALCIUM 9.1 11/01/2017 1435   PROT 7.3 02/25/2017 1407   ALBUMIN 4.1 02/25/2017 1407   AST 32 02/25/2017 1407   ALT 33 02/25/2017 1407   ALKPHOS 91 02/25/2017 1407   BILITOT 0.5 02/25/2017 1407   GFRNONAA >60 11/01/2017 1435   GFRAA >60 11/01/2017 1435       Component Value Date/Time   WBC 5.9 11/01/2017 1435  RBC 4.86 11/01/2017 1435   HGB 15.0 11/01/2017 1435   HCT 43.1 11/01/2017 1435   PLT 179 11/01/2017 1435   MCV 88.7 11/01/2017 1435   MCH 30.9 11/01/2017 1435   MCHC 34.8 11/01/2017 1435   RDW  13.0 11/01/2017 1435   LYMPHSABS 1.5 11/01/2017 1435   MONOABS 0.7 11/01/2017 1435   EOSABS 0.1 11/01/2017 1435   BASOSABS 0.0 11/01/2017 1435    No results found for: POCLITH, LITHIUM   No results found for: PHENYTOIN, PHENOBARB, VALPROATE, CBMZ   Normal stress test.  No exercise.  Won't stick with diet. 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  He has failed multiple medications listed above.  He still has significant symptoms but is getting some benefit out of [name].  He is aware this is off label usage.  Supportive therapy on problems with home life.  Trying to motivate for the hobbies.    Worse off [name] and seasonlly.  Better back on [name] with mood and anxiety.  Discussed side effects in detail and he agrees to increase [name] to 1.5 mg daily.   He agrees.  Discussed potential metabolic side effects associated with atypical antipsychotics, as well as potential risk for movement side effects. Advised pt to contact office if movement side effects occur.   Increase [name] to 1.5 mg daily to try to help anxiety and depression even more.  Disc SE including akathisia and TD.  It is worth noting that he was worse with irritability and edginess after reducing the Wellbutrin to 300 mg daily and better at 450 mg daily so we will continue Wellbutrin 450 mg daily.  Chronic attention problems remain and he is failed multiple ADD meds including off label things such as modafinil and Nuvigil  This appt was 30 mins.  FU 3 mos  Lynder Parents, MD, DFAPA   Please see After Visit Summary for patient specific instructions.  No future appointments.  No orders of the defined types were placed in this encounter.     -------------------------------

## 2019-06-01 ENCOUNTER — Ambulatory Visit (INDEPENDENT_AMBULATORY_CARE_PROVIDER_SITE_OTHER): Payer: Medicare Other | Admitting: Urology

## 2019-06-01 DIAGNOSIS — N401 Enlarged prostate with lower urinary tract symptoms: Secondary | ICD-10-CM

## 2019-06-01 DIAGNOSIS — N393 Stress incontinence (female) (male): Secondary | ICD-10-CM

## 2019-06-01 DIAGNOSIS — R350 Frequency of micturition: Secondary | ICD-10-CM | POA: Diagnosis not present

## 2019-06-29 ENCOUNTER — Other Ambulatory Visit: Payer: Self-pay | Admitting: Psychiatry

## 2019-07-09 ENCOUNTER — Other Ambulatory Visit: Payer: Self-pay | Admitting: Gastroenterology

## 2019-07-31 ENCOUNTER — Other Ambulatory Visit: Payer: Self-pay

## 2019-07-31 ENCOUNTER — Ambulatory Visit: Payer: Medicare Other | Admitting: Psychiatry

## 2019-07-31 ENCOUNTER — Emergency Department (HOSPITAL_COMMUNITY): Payer: Medicare Other

## 2019-07-31 ENCOUNTER — Inpatient Hospital Stay (HOSPITAL_COMMUNITY)
Admission: EM | Admit: 2019-07-31 | Discharge: 2019-08-03 | DRG: 390 | Disposition: A | Discharge status: Home / Self Care | Payer: Medicare Other | Attending: Internal Medicine | Admitting: Internal Medicine

## 2019-07-31 ENCOUNTER — Encounter (HOSPITAL_COMMUNITY): Payer: Self-pay | Admitting: Emergency Medicine

## 2019-07-31 DIAGNOSIS — F3160 Bipolar disorder, current episode mixed, unspecified: Secondary | ICD-10-CM | POA: Diagnosis not present

## 2019-07-31 DIAGNOSIS — G4733 Obstructive sleep apnea (adult) (pediatric): Secondary | ICD-10-CM | POA: Diagnosis present

## 2019-07-31 DIAGNOSIS — K219 Gastro-esophageal reflux disease without esophagitis: Secondary | ICD-10-CM | POA: Diagnosis present

## 2019-07-31 DIAGNOSIS — K56609 Unspecified intestinal obstruction, unspecified as to partial versus complete obstruction: Secondary | ICD-10-CM | POA: Diagnosis not present

## 2019-07-31 DIAGNOSIS — M19041 Primary osteoarthritis, right hand: Secondary | ICD-10-CM | POA: Diagnosis present

## 2019-07-31 DIAGNOSIS — Z7984 Long term (current) use of oral hypoglycemic drugs: Secondary | ICD-10-CM

## 2019-07-31 DIAGNOSIS — H409 Unspecified glaucoma: Secondary | ICD-10-CM | POA: Diagnosis present

## 2019-07-31 DIAGNOSIS — Z833 Family history of diabetes mellitus: Secondary | ICD-10-CM

## 2019-07-31 DIAGNOSIS — Z7951 Long term (current) use of inhaled steroids: Secondary | ICD-10-CM | POA: Diagnosis not present

## 2019-07-31 DIAGNOSIS — F319 Bipolar disorder, unspecified: Secondary | ICD-10-CM | POA: Diagnosis present

## 2019-07-31 DIAGNOSIS — R109 Unspecified abdominal pain: Secondary | ICD-10-CM | POA: Diagnosis present

## 2019-07-31 DIAGNOSIS — Z20828 Contact with and (suspected) exposure to other viral communicable diseases: Secondary | ICD-10-CM | POA: Diagnosis present

## 2019-07-31 DIAGNOSIS — M19042 Primary osteoarthritis, left hand: Secondary | ICD-10-CM | POA: Diagnosis present

## 2019-07-31 DIAGNOSIS — Z9049 Acquired absence of other specified parts of digestive tract: Secondary | ICD-10-CM

## 2019-07-31 DIAGNOSIS — N400 Enlarged prostate without lower urinary tract symptoms: Secondary | ICD-10-CM | POA: Diagnosis present

## 2019-07-31 DIAGNOSIS — K565 Intestinal adhesions [bands], unspecified as to partial versus complete obstruction: Principal | ICD-10-CM | POA: Diagnosis present

## 2019-07-31 DIAGNOSIS — Z85038 Personal history of other malignant neoplasm of large intestine: Secondary | ICD-10-CM

## 2019-07-31 DIAGNOSIS — Z888 Allergy status to other drugs, medicaments and biological substances status: Secondary | ICD-10-CM

## 2019-07-31 DIAGNOSIS — E119 Type 2 diabetes mellitus without complications: Secondary | ICD-10-CM | POA: Diagnosis present

## 2019-07-31 DIAGNOSIS — Z881 Allergy status to other antibiotic agents status: Secondary | ICD-10-CM

## 2019-07-31 DIAGNOSIS — Z801 Family history of malignant neoplasm of trachea, bronchus and lung: Secondary | ICD-10-CM | POA: Diagnosis not present

## 2019-07-31 DIAGNOSIS — Z885 Allergy status to narcotic agent status: Secondary | ICD-10-CM

## 2019-07-31 DIAGNOSIS — G4730 Sleep apnea, unspecified: Secondary | ICD-10-CM | POA: Diagnosis present

## 2019-07-31 LAB — CBC WITH DIFFERENTIAL/PLATELET
Abs Immature Granulocytes: 0.02 10*3/uL (ref 0.00–0.07)
Basophils Absolute: 0 10*3/uL (ref 0.0–0.1)
Basophils Relative: 0 %
Eosinophils Absolute: 0 10*3/uL (ref 0.0–0.5)
Eosinophils Relative: 0 %
HCT: 48 % (ref 39.0–52.0)
Hemoglobin: 16.9 g/dL (ref 13.0–17.0)
Immature Granulocytes: 0 %
Lymphocytes Relative: 8 %
Lymphs Abs: 0.7 10*3/uL (ref 0.7–4.0)
MCH: 30.2 pg (ref 26.0–34.0)
MCHC: 35.2 g/dL (ref 30.0–36.0)
MCV: 85.7 fL (ref 80.0–100.0)
Monocytes Absolute: 0.9 10*3/uL (ref 0.1–1.0)
Monocytes Relative: 10 %
Neutro Abs: 7.9 10*3/uL — ABNORMAL HIGH (ref 1.7–7.7)
Neutrophils Relative %: 82 %
Platelets: 168 10*3/uL (ref 150–400)
RBC: 5.6 MIL/uL (ref 4.22–5.81)
RDW: 12.5 % (ref 11.5–15.5)
WBC: 9.6 10*3/uL (ref 4.0–10.5)
nRBC: 0 % (ref 0.0–0.2)

## 2019-07-31 LAB — COMPREHENSIVE METABOLIC PANEL
ALT: 16 U/L (ref 0–44)
ALT: 14 U/L (ref 0–44)
AST: 19 U/L (ref 15–41)
AST: 17 U/L (ref 15–41)
Albumin: 4.5 g/dL (ref 3.5–5.0)
Albumin: 3.8 g/dL (ref 3.5–5.0)
Alkaline Phosphatase: 100 U/L (ref 38–126)
Alkaline Phosphatase: 84 U/L (ref 38–126)
Anion gap: 9 (ref 5–15)
Anion gap: 8 (ref 5–15)
BUN: 20 mg/dL (ref 8–23)
BUN: 17 mg/dL (ref 8–23)
CO2: 26 mmol/L (ref 22–32)
CO2: 26 mmol/L (ref 22–32)
Calcium: 9.4 mg/dL (ref 8.9–10.3)
Calcium: 8.8 mg/dL — ABNORMAL LOW (ref 8.9–10.3)
Chloride: 102 mmol/L (ref 98–111)
Chloride: 104 mmol/L (ref 98–111)
Creatinine, Ser: 0.84 mg/dL (ref 0.61–1.24)
Creatinine, Ser: 0.86 mg/dL (ref 0.61–1.24)
GFR calc Af Amer: >60 mL/min (ref >60)
GFR calc Af Amer: >60 mL/min (ref >60)
GFR calc non Af Amer: >60 mL/min (ref >60)
GFR calc non Af Amer: >60 mL/min (ref >60)
Glucose, Bld: 114 mg/dL — ABNORMAL HIGH (ref 70–99)
Glucose, Bld: 105 mg/dL — ABNORMAL HIGH (ref 70–99)
Potassium: 4 mmol/L (ref 3.5–5.1)
Potassium: 4.1 mmol/L (ref 3.5–5.1)
Sodium: 137 mmol/L (ref 135–145)
Sodium: 138 mmol/L (ref 135–145)
Total Bilirubin: 0.8 mg/dL (ref 0.3–1.2)
Total Bilirubin: 0.8 mg/dL (ref 0.3–1.2)
Total Protein: 7.8 g/dL (ref 6.5–8.1)
Total Protein: 6.8 g/dL (ref 6.5–8.1)

## 2019-07-31 LAB — SARS CORONAVIRUS 2 BY RT PCR (HOSPITAL ORDER, PERFORMED IN ~~LOC~~ HOSPITAL LAB): SARS Coronavirus 2: NEGATIVE

## 2019-07-31 LAB — GLUCOSE, CAPILLARY
Glucose-Capillary: 98 mg/dL (ref 70–99)
Glucose-Capillary: 94 mg/dL (ref 70–99)
Glucose-Capillary: 87 mg/dL (ref 70–99)

## 2019-07-31 LAB — LIPASE, BLOOD: Lipase: 25 U/L (ref 11–51)

## 2019-07-31 LAB — CBC
HCT: 43.4 % (ref 39.0–52.0)
Hemoglobin: 15.1 g/dL (ref 13.0–17.0)
MCH: 29.8 pg (ref 26.0–34.0)
MCHC: 34.8 g/dL (ref 30.0–36.0)
MCV: 85.6 fL (ref 80.0–100.0)
Platelets: 159 10*3/uL (ref 150–400)
RBC: 5.07 MIL/uL (ref 4.22–5.81)
RDW: 12.8 % (ref 11.5–15.5)
WBC: 7.5 10*3/uL (ref 4.0–10.5)
nRBC: 0 % (ref 0.0–0.2)

## 2019-07-31 LAB — PHOSPHORUS: Phosphorus: 3.5 mg/dL (ref 2.5–4.6)

## 2019-07-31 LAB — MAGNESIUM: Magnesium: 2.2 mg/dL (ref 1.7–2.4)

## 2019-07-31 MED ORDER — METOCLOPRAMIDE HCL 5 MG/ML IJ SOLN
5.0000 mg | Freq: Once | INTRAMUSCULAR | Status: AC
  Administered 2019-07-31: 5 mg via INTRAVENOUS
  Filled 2019-07-31: qty 2

## 2019-07-31 MED ORDER — PROCHLORPERAZINE EDISYLATE 10 MG/2ML IJ SOLN
5.0000 mg | INTRAMUSCULAR | Status: DC | PRN
  Administered 2019-07-31 (×2): 5 mg via INTRAVENOUS
  Filled 2019-07-31 (×2): qty 2

## 2019-07-31 MED ORDER — LATANOPROST 0.005 % OP SOLN
1.0000 [drp] | Freq: Every day | OPHTHALMIC | Status: DC
  Administered 2019-08-01 – 2019-08-02 (×2): 1 [drp] via OPHTHALMIC
  Filled 2019-07-31: qty 2.5

## 2019-07-31 MED ORDER — POTASSIUM CHLORIDE IN NACL 20-0.9 MEQ/L-% IV SOLN
INTRAVENOUS | Status: DC
  Administered 2019-07-31 – 2019-08-03 (×6): via INTRAVENOUS

## 2019-07-31 MED ORDER — BRIMONIDINE TARTRATE 0.2 % OP SOLN
1.0000 [drp] | Freq: Two times a day (BID) | OPHTHALMIC | Status: DC
  Administered 2019-07-31 – 2019-08-03 (×6): 1 [drp] via OPHTHALMIC
  Filled 2019-07-31: qty 5

## 2019-07-31 MED ORDER — PANTOPRAZOLE SODIUM 40 MG IV SOLR
40.0000 mg | INTRAVENOUS | Status: DC
  Administered 2019-07-31 – 2019-08-03 (×4): 40 mg via INTRAVENOUS
  Filled 2019-07-31 (×4): qty 40

## 2019-07-31 MED ORDER — IOHEXOL 300 MG/ML  SOLN
100.0000 mL | Freq: Once | INTRAMUSCULAR | Status: AC | PRN
  Administered 2019-07-31: 100 mL via INTRAVENOUS

## 2019-07-31 MED ORDER — SODIUM CHLORIDE 0.9 % IV BOLUS
1000.0000 mL | Freq: Once | INTRAVENOUS | Status: AC
  Administered 2019-07-31: 1000 mL via INTRAVENOUS

## 2019-07-31 MED ORDER — FENTANYL CITRATE (PF) 100 MCG/2ML IJ SOLN
50.0000 ug | Freq: Once | INTRAMUSCULAR | Status: AC
  Administered 2019-07-31: 50 ug via INTRAVENOUS
  Filled 2019-07-31: qty 2

## 2019-07-31 MED ORDER — FENTANYL CITRATE (PF) 100 MCG/2ML IJ SOLN
25.0000 ug | INTRAMUSCULAR | Status: DC | PRN
  Administered 2019-07-31 – 2019-08-02 (×7): 25 ug via INTRAVENOUS
  Filled 2019-07-31 (×7): qty 2

## 2019-07-31 MED ORDER — DORZOLAMIDE HCL-TIMOLOL MAL 2-0.5 % OP SOLN
1.0000 [drp] | Freq: Two times a day (BID) | OPHTHALMIC | Status: DC
  Administered 2019-07-31 – 2019-08-03 (×6): 1 [drp] via OPHTHALMIC
  Filled 2019-07-31: qty 10

## 2019-07-31 MED ORDER — ONDANSETRON HCL 4 MG/2ML IJ SOLN
4.0000 mg | Freq: Once | INTRAMUSCULAR | Status: AC
  Administered 2019-07-31: 4 mg via INTRAVENOUS
  Filled 2019-07-31: qty 2

## 2019-07-31 MED ORDER — PROCHLORPERAZINE EDISYLATE 10 MG/2ML IJ SOLN
5.0000 mg | Freq: Once | INTRAMUSCULAR | Status: AC
  Administered 2019-07-31: 07:00:00 5 mg via INTRAVENOUS
  Filled 2019-07-31: qty 2

## 2019-07-31 NOTE — Consult Note (Addendum)
I was present with the medical student for this service. I personally verified the history of present illness, performed the physical exam, and made the plan for this encounter. I have verified the medical student's documentation and made modifications where appropriately. I have personally documented in my own words a brief history, physical, and plan below.     SBO on CT. Dilated SB and decompressed distal bowel looks like originating in th RLQ. Labs wnl. Exam reassuring. Relatively nondistended, soft, and only mildly tender with deep palpation. He did report a colonoscopy in 2017 with concern for ulcer at the anastomosis. He has no personal history of Crohn's but said that was questions with this finding. He has had no other issues with his anastomosis.  Anastomosis appears fine on CT. This SBO is more related to adhesive disease based on the CT.   NPO, NG Electrolyte replacement, Ordered Mg and Phos for tomorrow Discuss 48 hr of non op management and possible SBFT> then surgery Friday if SBFT was positive. Patient reported he may not want to have surgery at [facility]. I discussed that we do this surgery regularly but that if it came to that we would respect his wishes.  Monitor exam. I will try to discuss with Dr. Gala Romney and ask his thoughts about the anastomosis ulcer and plans for follow up on this. Will let Dr. Arnoldo Morale know as well.    Updated Dr. Dyann Kief.   Curlene Labrum, MD [facility] [address] [place], [place] [address] [phone] (office)     [place]  Reason for Consult: Small bowel obstruction Referring Physician: Dr. Tennis Must  Chief Complaint    Abdominal Pain      HPI: [name] is a 68 y.o. man who presented to the ED one day ago for new onset abdominal pain. Patient previously in normal state of health; he reports experiencing nausea upon waking up yesterday. Patient has cereal for  breakfast, chicken soup for lunch and a strawberry milkshake later in the day. That evening patient had four episodes of emesis; vomitus contents were the strawberry milkshake and chicken soup. Patient reports associated bloating, no flatus since onset of his nausea, last bowel movement the day prior to arrival. He denies fever, chills, diaphoresis, cough, diarrhea, or new rashes. Patient denies history of similar abdominal pain.   Patient has a remote history of kidney stone and is currently be worked up for hesitancy and dribbling associated with his BPH. Patient denies history of UTIs; denies associated hematuria, discharge, urgency or frequency.  Patient has a history of colon cancer with right hemicolectomy in 2007.  Hemorrhoidectomy in 2010. These were performed Dr. Arnoldo Morale.   Patient has a history of diabetes, previously taking Metformin 500mg  qd. However, in March patient began limiting his food portions and walking 3-4 times a week. He has lost a total of 50lbs. He no longer takes Metformin and has been able to stop using his CPAP machine.    Past Medical History:  Diagnosis Date  . Anemia    hx of   . Arthritis    fingers   . BPH (benign prostatic hyperplasia)   . Colon cancer ([place])    stage II, diagnosed 2007  . Complication of anesthesia    vagal response after umbilcal hernia at [facility]  . Depression   . Diabetes mellitus    no as of 09/08/16 due to weight loss   . GERD (gastroesophageal reflux disease)   .  Glaucoma 2012  . History of kidney stones   . Sleep apnea    wears CiPaP at night  . Staph infection 2008   left side of face    Past Surgical History:  Procedure Laterality Date  . APPENDECTOMY    . CARDIAC CATHETERIZATION    . COLECTOMY  2007   right hemicolectomy  . COLONOSCOPY  05/11/10   normal rectum,normal residual colon with some angry appearring anastomtic mucosa with some erosions and oozing, bx unremarkable  . COLONOSCOPY  05/16/2012   Dr. Gala Romney-  normal rectum, sigmoid diverticulosis, anastomotic ulcers. bx= acute ulcer and benign colonic mucosa with intramucosal lympoid aggregates  . COLONOSCOPY N/A 11/17/2016   Procedure: COLONOSCOPY;  Surgeon: Daneil Dolin, MD;  [facility];  Service: Endoscopy;  Laterality: N/A;  100  . ESOPHAGEAL DILATION N/A 10/28/2015   Procedure: ESOPHAGEAL DILATION;  Surgeon: Daneil Dolin, MD;  [facility];  Service: Endoscopy;  Laterality: N/A;  . ESOPHAGOGASTRODUODENOSCOPY N/A 10/28/2015   RMR: ulcerated proximal esophageal mass ad described status post biopsy. Abnormal distal esophagus biopsy. status post biopsy. Hiatal hernia . abnormal gastric mucosa doubt clinical significance status post biopsy  . ESOPHAGOGASTRODUODENOSCOPY (EGD) WITH PROPOFOL N/A 11/07/2017   Procedure: ESOPHAGOGASTRODUODENOSCOPY (EGD) WITH PROPOFOL;  Surgeon: Daneil Dolin, MD;  [facility];  Service: Endoscopy;  Laterality: N/A;  8:15am  . EUS N/A 11/20/2015   Procedure: UPPER ENDOSCOPIC ULTRASOUND (EUS) LINEAR;  Surgeon: Milus Banister, MD;  [facility]: [facility];  Service: Endoscopy;  Laterality: N/A;  . HEMORRHOID SURGERY  12/31/2011   Procedure: HEMORRHOIDECTOMY;  Surgeon: Jamesetta So;  Location: [facility] OR[facility];  Service: General;  Laterality: N/A;  . HERNIA REPAIR     umbilical  . KIDNEY STONE SURGERY    . PILONIDAL CYST / SINUS EXCISION  1976  . SKIN LESION EXCISION     left shoulder/pre cancerous  . TRANSURETHRAL RESECTION OF PROSTATE N/A 09/09/2016   Procedure: TRANSURETHRAL RESECTION OF THE PROSTATE (TURP);  Surgeon: Irine Seal, MD;  Location: [facility];  Service: Urology;  Laterality: N/A;    Family History  Problem Relation Age of Onset  . Lung cancer Father   . Diabetes Mother   . Anesthesia problems Neg Hx   . Hypotension Neg Hx   . Malignant hyperthermia Neg Hx   . Pseudochol deficiency Neg Hx     Social History   Tobacco Use  . Smoking status: Never Smoker  . Smokeless  tobacco: Never Used  . Tobacco comment: Never smoked  Substance Use Topics  . Alcohol use: No    Alcohol/week: 0.0 standard drinks  . Drug use: No    Medications:  Current Facility-Administered Medications  Medication Dose Route Frequency Provider Last Rate Last Dose  . 0.9 % NaCl with KCl 20 mEq/ L  infusion   Intravenous Continuous Reubin Milan, MD 100 mL/hr at 07/31/19 0630    . fentaNYL (SUBLIMAZE) injection 25 mcg  25 mcg Intravenous Q2H PRN Reubin Milan, MD   25 mcg at 07/31/19 1236  . pantoprazole (PROTONIX) injection 40 mg  40 mg Intravenous Q24H Reubin Milan, MD   40 mg at 07/31/19 0630  . prochlorperazine (COMPAZINE) injection 5 mg  5 mg Intravenous Q4H PRN Reubin Milan, MD   5 mg at 07/31/19 1737   Medications Prior to Admission  Medication Sig Dispense Refill Last Dose  . brimonidine (ALPHAGAN) 0.2 % ophthalmic solution Place 1 drop into  both eyes 2 (two) times daily.    07/30/2019 at Unknown time  . buPROPion (WELLBUTRIN XL) 150 MG 24 hr tablet Take 3 tablets (450 mg total) by mouth every morning. 270 tablet 3 07/30/2019 at 0730  . dorzolamide-timolol (COSOPT) 22.3-6.8 MG/ML ophthalmic solution Place 1 drop into both eyes 2 (two) times daily.   07/30/2019 at Unknown time  . fluticasone (FLONASE) 50 MCG/ACT nasal spray Place 2 sprays into both nostrils daily as needed.   2   . lamoTRIgine (LAMICTAL) 200 MG tablet Take 1 tablet (200 mg total) by mouth daily. 90 tablet 3 07/30/2019 at 0730  . latanoprost (XALATAN) 0.005 % ophthalmic solution Place 1 drop into both eyes at bedtime.   07/30/2019 at Unknown time  . pantoprazole (PROTONIX) 40 MG tablet Take 1 tablet (40 mg total) by mouth 2 (two) times daily before a meal. 60 tablet 5 07/30/2019  . traZODone (DESYREL) 50 MG tablet Take 1-2 tablets (50-100 mg total) by mouth at bedtime as needed for sleep (insomnia). 180 tablet 3 07/30/2019 at Unknown time  . vitamin B-12 (CYANOCOBALAMIN) 500 MCG tablet Take 500  mcg every evening by mouth.   07/30/2019 at Unknown time  . [name] capsule TAKE 1 CAPSULE(1.5 MG) BY MOUTH DAILY (Patient taking differently: Take 1.5 mg by mouth daily. ) 30 capsule 1 07/30/2019 at 0730   Allergies  Allergen Reactions  . Ambien [Zolpidem Tartrate]     Sleep walks  . Bactrim [Sulfamethoxazole-Trimethoprim] Hives and Swelling  . Dilaudid [Hydromorphone Hcl] Other (See Comments)    Made blood pressure drop with colon resection surgery in 2007   . Morphine And Related Nausea And Vomiting     ROS:  Review of Systems  Constitutional: Positive for appetite change. Negative for chills, diaphoresis, fever and unexpected weight change.  HENT: Negative for sore throat.   Respiratory: Negative for cough.   Cardiovascular: Negative for chest pain.  Gastrointestinal: Positive for abdominal distention, abdominal pain, constipation, nausea and vomiting. Negative for blood in stool and diarrhea.  Genitourinary: Positive for dysuria. Negative for frequency, hematuria and urgency.  Skin: Negative for rash.  Neurological: Negative for weakness and numbness.     Blood pressure (!) 163/88, pulse (!) 52, temperature 98.1 F (36.7 C), temperature source Oral, resp. rate 19, height 5\' 9"  (1.753 m), weight 87.2 kg, SpO2 97 %. Physical Exam Vitals signs reviewed.  Constitutional:      General: He is not in acute distress. HENT:     Head: Normocephalic and atraumatic.  Eyes:     Extraocular Movements: Extraocular movements intact.  Cardiovascular:     Rate and Rhythm: Normal rate and regular rhythm.     Pulses:          Radial pulses are 2+ on the right side and 2+ on the left side.       Dorsalis pedis pulses are 2+ on the right side and 2+ on the left side.     Heart sounds: Normal heart sounds. No murmur.  Pulmonary:     Effort: Pulmonary effort is normal.     Breath sounds: Normal breath sounds.  Abdominal:     General: Abdomen is protuberant. A surgical scar is present. Bowel  sounds are normal. There is distension.     Tenderness: There is generalized abdominal tenderness. There is no guarding or rebound.     Hernia: No hernia is present.     Comments: Healed vertical incision just right of the midline  Skin:  General: Skin is warm and dry.  Neurological:     Mental Status: He is alert.     Results: Results for orders placed or performed during the hospital encounter of 07/31/19 (from the past 48 hour(s))  Comprehensive metabolic panel     Status: Abnormal   Collection Time: 07/31/19  2:35 AM  Result Value Ref Range   Sodium 137 135 - 145 mmol/L   Potassium 4.0 3.5 - 5.1 mmol/L   Chloride 102 98 - 111 mmol/L   CO2 26 22 - 32 mmol/L   Glucose, Bld 114 (H) 70 - 99 mg/dL   BUN 20 8 - 23 mg/dL   Creatinine, Ser 0.84 0.61 - 1.24 mg/dL   Calcium 9.4 8.9 - 10.3 mg/dL   Total Protein 7.8 6.5 - 8.1 g/dL   Albumin 4.5 3.5 - 5.0 g/dL   AST 19 15 - 41 U/L   ALT 16 0 - 44 U/L   Alkaline Phosphatase 100 38 - 126 U/L   Total Bilirubin 0.8 0.3 - 1.2 mg/dL   GFR calc non Af Amer >60 >60 mL/min   GFR calc Af Amer >60 >60 mL/min   Anion gap 9 5 - 15    Comment: Performed at [facility], [address], [place], [place] [address]  Lipase, blood     Status: None   Collection Time: 07/31/19  2:35 AM  Result Value Ref Range   Lipase 25 11 - 51 U/L    Comment: Performed at [facility], [address] [place], [place] [address]  CBC with Differential     Status: Abnormal   Collection Time: 07/31/19  2:35 AM  Result Value Ref Range   WBC 9.6 4.0 - 10.5 K/uL   RBC 5.60 4.22 - 5.81 MIL/uL   Hemoglobin 16.9 13.0 - 17.0 g/dL   HCT 48.0 39.0 - 52.0 %   MCV 85.7 80.0 - 100.0 fL   MCH 30.2 26.0 - 34.0 pg   MCHC 35.2 30.0 - 36.0 g/dL   RDW 12.5 11.5 - 15.5 %   Platelets 168 150 - 400 K/uL   nRBC 0.0 0.0 - 0.2 %   Neutrophils Relative % 82 %   Neutro Abs 7.9 (H) 1.7 - 7.7 K/uL   Lymphocytes Relative 8 %   Lymphs Abs 0.7 0.7 - 4.0 K/uL   Monocytes Relative 10 %    Monocytes Absolute 0.9 0.1 - 1.0 K/uL   Eosinophils Relative 0 %   Eosinophils Absolute 0.0 0.0 - 0.5 K/uL   Basophils Relative 0 %   Basophils Absolute 0.0 0.0 - 0.1 K/uL   Immature Granulocytes 0 %   Abs Immature Granulocytes 0.02 0.00 - 0.07 K/uL    Comment: Performed at [facility], [address] [place], [place] [address]  Magnesium     Status: None   Collection Time: 07/31/19  2:35 AM  Result Value Ref Range   Magnesium 2.2 1.7 - 2.4 mg/dL    Comment: Performed at [facility], [address], [place], [place] [address]  Phosphorus     Status: None   Collection Time: 07/31/19  2:35 AM  Result Value Ref Range   Phosphorus 3.5 2.5 - 4.6 mg/dL    Comment: Performed at [facility], [address] [place],  [address]  SARS Coronavirus 2 [facility] order, Performed in [facility] hospital lab) Nasopharyngeal Nasopharyngeal Swab     Status: None   Collection Time: 07/31/19  4:13 AM   Specimen: Nasopharyngeal  Swab  Result Value Ref Range   SARS Coronavirus 2 NEGATIVE NEGATIVE    Comment: (NOTE) If result is NEGATIVE SARS-CoV-2 target nucleic acids are NOT DETECTED. The SARS-CoV-2 RNA is generally detectable in upper and lower  respiratory specimens during the acute phase of infection. The lowest  concentration of SARS-CoV-2 viral copies this assay can detect is 250  copies / mL. A negative result does not preclude SARS-CoV-2 infection  and should not be used as the sole basis for treatment or other  patient management decisions.  A negative result may occur with  improper specimen collection / handling, submission of specimen other  than nasopharyngeal swab, presence of viral mutation(s) within the  areas targeted by this assay, and inadequate number of viral copies  (<250 copies / mL). A negative result must be combined with clinical  observations, patient history, and epidemiological information. If result is POSITIVE SARS-CoV-2 target nucleic acids are DETECTED. The  SARS-CoV-2 RNA is generally detectable in upper and lower  respiratory specimens dur ing the acute phase of infection.  Positive  results are indicative of active infection with SARS-CoV-2.  Clinical  correlation with patient history and other diagnostic information is  necessary to determine patient infection status.  Positive results do  not rule out bacterial infection or co-infection with other viruses. If result is PRESUMPTIVE POSTIVE SARS-CoV-2 nucleic acids MAY BE PRESENT.   A presumptive positive result was obtained on the submitted specimen  and confirmed on repeat testing.  While 2019 novel coronavirus  (SARS-CoV-2) nucleic acids may be present in the submitted sample  additional confirmatory testing may be necessary for epidemiological  and / or clinical management purposes  to differentiate between  SARS-CoV-2 and other Sarbecovirus currently known to infect humans.  If clinically indicated additional testing with an alternate test  methodology [phone]) is advised. The SARS-CoV-2 RNA is generally  detectable in upper and lower respiratory sp ecimens during the acute  phase of infection. The expected result is Negative. Fact Sheet for Patients:  StrictlyIdeas.no Fact Sheet for Healthcare Providers: BankingDealers.co.za This test is not yet approved or cleared by the [place] FDA and has been authorized for detection and/or diagnosis of SARS-CoV-2 by FDA under an Emergency Use Authorization (EUA).  This EUA will remain in effect (meaning this test can be used) for the duration of the COVID-19 declaration under Section 564(b)(1) of the Act, 21 U.S.C. section 360bbb-3(b)(1), unless the authorization is terminated or revoked sooner. Performed at [facility], [address] [place], [place] [address]   CBC     Status: None   Collection Time: 07/31/19  6:08 AM  Result Value Ref Range   WBC 7.5 4.0 - 10.5 K/uL   RBC 5.07  4.22 - 5.81 MIL/uL   Hemoglobin 15.1 13.0 - 17.0 g/dL   HCT 43.4 39.0 - 52.0 %   MCV 85.6 80.0 - 100.0 fL   MCH 29.8 26.0 - 34.0 pg   MCHC 34.8 30.0 - 36.0 g/dL   RDW 12.8 11.5 - 15.5 %   Platelets 159 150 - 400 K/uL   nRBC 0.0 0.0 - 0.2 %    Comment: Performed at [facility], [address] [place], [place] [address]  Comprehensive metabolic panel     Status: Abnormal   Collection Time: 07/31/19  6:08 AM  Result Value Ref Range   Sodium 138 135 - 145 mmol/L   Potassium 4.1 3.5 - 5.1 mmol/L   Chloride 104 98 - 111 mmol/L  CO2 26 22 - 32 mmol/L   Glucose, Bld 105 (H) 70 - 99 mg/dL   BUN 17 8 - 23 mg/dL   Creatinine, Ser 0.86 0.61 - 1.24 mg/dL   Calcium 8.8 (L) 8.9 - 10.3 mg/dL   Total Protein 6.8 6.5 - 8.1 g/dL   Albumin 3.8 3.5 - 5.0 g/dL   AST 17 15 - 41 U/L   ALT 14 0 - 44 U/L   Alkaline Phosphatase 84 38 - 126 U/L   Total Bilirubin 0.8 0.3 - 1.2 mg/dL   GFR calc non Af Amer >60 >60 mL/min   GFR calc Af Amer >60 >60 mL/min   Anion gap 8 5 - 15    Comment: Performed at [facility], [address], [place], [place] [address]  Glucose, capillary     Status: None   Collection Time: 07/31/19 11:04 AM  Result Value Ref Range   Glucose-Capillary 98 70 - 99 mg/dL   Dr. Constance Haw- Personally reviewed CT- distended proximal bowel and decompressed distal in the RLQ, no thickening, free air or free fluid, anastomosis appears patient  Ct Abdomen Pelvis W Contrast  Result Date: 07/31/2019 CLINICAL DATA:  68 year old male with abdominal pain, nausea vomiting. Concern for bowel obstruction. EXAM: CT ABDOMEN AND PELVIS WITH CONTRAST TECHNIQUE: Multidetector CT imaging of the abdomen and pelvis was performed using the standard protocol following bolus administration of intravenous contrast. CONTRAST:  118mL OMNIPAQUE IOHEXOL 300 MG/ML  SOLN COMPARISON:  CT of the abdomen pelvis dated 02/25/2017 FINDINGS: Lower chest: The visualized lung bases are unremarkable. No intra-abdominal free air.  Small free fluid the pelvis. Hepatobiliary: No focal liver abnormality is seen. No gallstones, gallbladder wall thickening, or biliary dilatation. Pancreas: Unremarkable. No pancreatic ductal dilatation or surrounding inflammatory changes. Spleen: Normal in size without focal abnormality. Adrenals/Urinary Tract: The adrenal glands are unremarkable. Multiple left renal cysts measure up to 3 cm. Several subcentimeter hypodensities are too small to characterize. There is no hydronephrosis on either side. There is symmetric enhancement and excretion of contrast by both kidneys. The visualized ureters and urinary bladder appear unremarkable. Stomach/Bowel: The stomach is distended. There multiple dilated loops of small bowel throughout the abdomen measuring up to 3.4 cm in diameter. The distal small bowel is collapsed. A point of transition in the right lower abdomen (series 2, image [id number]) were there is abutment of adjacent loops of small bowel, likely related to adhesions. There is postsurgical changes of proximal colon resection with right upper quadrant ileocolic anastomosis. No evidence of obstruction at the level of anastomosis. There is loose stool in the proximal colon. Appendectomy. Vascular/Lymphatic: Mild aortoiliac atherosclerotic disease. No portal venous gas. There is no adenopathy. Reproductive: The prostate and seminal vesicles are grossly unremarkable. No pelvic mass. Other: Midline anterior abdominal wall surgical scar. Musculoskeletal: Degenerative changes of the spine. No acute osseous pathology. IMPRESSION: 1. Small-bowel obstruction with transition zone in the right lower abdomen likely related to adhesions. 2. Postsurgical changes of proximal colon resection with right upper quadrant ileocolic anastomosis. No evidence of obstruction at the level of anastomosis. 3. Aortic Atherosclerosis (ICD10-I70.0). Electronically Signed   By: Anner Crete M.D.   On: 07/31/2019 03:59   Dg Chest Portable 1  View  Result Date: 07/31/2019 CLINICAL DATA:  Nasogastric tube placement EXAM: PORTABLE CHEST 1 VIEW COMPARISON:  Abdomen and pelvis CT from earlier today FINDINGS: Nasogastric tube with tip reaching the stomach. Gas and fluid is seen within the stomach. Aortic tortuosity. Normal heart size. There  is no edema, consolidation, effusion, or pneumothorax. IMPRESSION: The nasogastric tube tip and side-port reaches the distended stomach. Electronically Signed   By: Monte Fantasia M.D.   On: 07/31/2019 04:31   Dg Abd 2 Views  Result Date: 07/31/2019 CLINICAL DATA:  68 year old male with abdominal pain and bloating with nausea vomiting. EXAM: ABDOMEN - 2 VIEW COMPARISON:  [facility] 10/05/2007.  CT Abdomen and Pelvis 02/25/2017. The FINDINGS: Upright and supine views. No pneumoperitoneum. Negative lung bases. Air-fluid level in the stomach, and multiple small-bowel air-fluid levels with small-bowel loops dilated up to 40-45 millimeters diameter. Stable right abdominal surgical clips and staple line. Paucity of large bowel gas. No acute osseous abnormality identified. IMPRESSION: 1. Positive for small bowel obstruction. No free air. 2. Previous right hemicolectomy. Electronically Signed   By: Genevie Ann M.D.   On: 07/31/2019 03:34    Assessment & Plan:  [name] is a 68 y.o. man with significant medical history including colon cancer T3 N0, stage IIs/p right hemicolectomy in 2007, who presented to the ED one day ago with abdominal pain, nausea, vomiting, and absent flatulence. On presentation to the ED, patient's pressure was elevated at 188/80, vitals were otherwise within normal limits. CBC, CMP, and lipase were unremarkable. UA still pending. Abdominal CT demonstrates several dilated loops of small bowel, with distal collapse, transition point visualized in right lower abdomen, no obstruction related to the ileocolic anastomosis. 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   Currently patient is clinically stable; no evidence of bowel ischemia, necrosis or perforation.   Plan  -NPO -NS at 100 mL/hr  -NG suctioning for gastric and small bowel decompression -PRN analgesic and antiemetic -Close monitoring for developing signs and symptoms emergent complications   Mercy Moore 07/31/2019, 4:41 PM

## 2019-07-31 NOTE — H&P (Signed)
History and Physical    [name] XBJ:[id number] DOB: Nov 22, 1951 DOA: 07/31/2019  PCP: Rory Percy, MD   Patient coming from: Home.  I have personally briefly reviewed patient's old medical records in [place]  Chief Complaint: Abdominal pain.  HPI: [name] is a 68 y.o. male with medical history significant of anemia, osteoarthritis, BPH, colon cancer, depression, type 2 diabetes, GERD, glaucoma, history of renal atelectasis, sleep apnea on CPAP, history of left-sided face staph infection who is coming to the emergency department due to generalized abdominal pain associated with nausea and 4 episodes vomiting since yesterday evening.He mentioned that he has been on a diet since March this and has lost about 50 pounds.  He ate some almonds on Monday. He had chicken soup for lunch yesterday and felt he had eaten too much and felt nauseous. He tried some gelatin early evening, but did not feel better. He tried some cottage cheese for dinner, but states that his nausea became worse. Around 2100, he started having vomiting and had four episodes. Emesis helped transiently with the pain, but he still felt bloated. He went to bed and tried to sleep, but kept feeling worse, so around 0100 he decided to come to the ED. His last BM was yesterday morning, but it was not fully formed. He has not passed gas since yesterday. He denies fever, chills, sore throat and rhinorrhea. No CP, palpitations, dizziness, diaphoresis, PND, orthopnea or lower extremities edema. No dysuria, frequency or hematuria. Denies polyuria, polydipsia or blurred vision.  ED Course: Initial vital signs were temperature 98.1 F, pulse 60, respirations 17, blood pressure 188/80 mmHg O2 sat 98% on room air.  The patient received a 1000 mL NS bolus, fentanyl 50 mcg IVP x1 and ondansetron 4 mg IVP x1.  CBC showed a white count of 9.6 with 82% neutrophils, 8% lymphs and 10% monocytes.  Hemoglobin 16.9 g/dL and platelets  168.His white count is 9.6 lipase was 25 units/L.  CMP showed a glucose of 114 mg/dL, but all other values were normal.  Imaging: A 2 view abdominal x-ray show air-fluid level in the stomach and multiple small bowel air-fluid levels with small bowel loops dilated up to 40 to 45 mm in diameter.  This was confirmed with CT abdomen/pelvis with contrast that also showed SBO with transition zone in the right lower abdomen likely related to adhesions.  There were poor surgical changes in proximal colon resection with right upper quadrant ileocolic anastomosis.  There was no evidence of obstruction at the level of anastomosis.  There was also aortic atherosclerosis.  Please see images and full radiology report for further detail.  Review of Systems: As per HPI otherwise 10 point review of systems negative.   Past Medical History:  Diagnosis Date   Anemia    hx of    Arthritis    fingers    BPH (benign prostatic hyperplasia)    Colon cancer ([place])    stage II, diagnosed 3086   Complication of anesthesia    vagal response after umbilcal hernia at [facility]   Depression    Diabetes mellitus    no as of 09/08/16 due to weight loss    GERD (gastroesophageal reflux disease)    Glaucoma 2012   History of kidney stones    Sleep apnea    wears CiPaP at night   Staph infection 2008   left side of face    Past Surgical History:  Procedure Laterality Date  APPENDECTOMY     CARDIAC CATHETERIZATION     COLECTOMY  2007   right hemicolectomy   COLONOSCOPY  05/11/10   normal rectum,normal residual colon with some angry appearring anastomtic mucosa with some erosions and oozing, bx unremarkable   COLONOSCOPY  05/16/2012   Dr. Gala Romney- normal rectum, sigmoid diverticulosis, anastomotic ulcers. bx= acute ulcer and benign colonic mucosa with intramucosal lympoid aggregates   COLONOSCOPY N/A 11/17/2016   Procedure: COLONOSCOPY;  Surgeon: Daneil Dolin, MD;  [facility];  Service:  Endoscopy;  Laterality: N/A;  100   ESOPHAGEAL DILATION N/A 10/28/2015   Procedure: ESOPHAGEAL DILATION;  Surgeon: Daneil Dolin, MD;  [facility];  Service: Endoscopy;  Laterality: N/A;   ESOPHAGOGASTRODUODENOSCOPY N/A 10/28/2015   RMR: ulcerated proximal esophageal mass ad described status post biopsy. Abnormal distal esophagus biopsy. status post biopsy. Hiatal hernia . abnormal gastric mucosa doubt clinical significance status post biopsy   ESOPHAGOGASTRODUODENOSCOPY (EGD) WITH PROPOFOL N/A 11/07/2017   Procedure: ESOPHAGOGASTRODUODENOSCOPY (EGD) WITH PROPOFOL;  Surgeon: Daneil Dolin, MD;  [facility];  Service: Endoscopy;  Laterality: N/A;  8:15am   EUS N/A 11/20/2015   Procedure: UPPER ENDOSCOPIC ULTRASOUND (EUS) LINEAR;  Surgeon: Milus Banister, MD;  Location: [facility];  Service: Endoscopy;  Laterality: N/A;   HEMORRHOID SURGERY  12/31/2011   Procedure: HEMORRHOIDECTOMY;  Surgeon: Jamesetta So;  Location: [facility];  Service: General;  Laterality: N/A;   HERNIA REPAIR     umbilical   KIDNEY STONE SURGERY     PILONIDAL CYST / SINUS EXCISION  1976   SKIN LESION EXCISION     left shoulder/pre cancerous   TRANSURETHRAL RESECTION OF PROSTATE N/A 09/09/2016   Procedure: TRANSURETHRAL RESECTION OF THE PROSTATE (TURP);  Surgeon: Irine Seal, MD;  Location: [facility];  Service: Urology;  Laterality: N/A;     reports that he has never smoked. He has never used smokeless tobacco. He reports that he does not drink alcohol or use drugs.  Allergies  Allergen Reactions   Ambien [Zolpidem Tartrate]     Sleep walks   Bactrim [Sulfamethoxazole-Trimethoprim] Hives and Swelling   Dilaudid [Hydromorphone Hcl] Other (See Comments)    Made blood pressure drop with colon resection surgery in 2007    Morphine And Related Nausea And Vomiting    Family History  Problem Relation Age of Onset   Lung cancer Father    Diabetes Mother    Anesthesia problems Neg Hx     Hypotension Neg Hx    Malignant hyperthermia Neg Hx    Pseudochol deficiency Neg Hx    Prior to Admission medications   Medication Sig Start Date End Date Taking? Authorizing Provider  brimonidine (ALPHAGAN) 0.2 % ophthalmic solution Place 1 drop into both eyes 2 (two) times daily.     [provider]  buPROPion (WELLBUTRIN XL) 150 MG 24 hr tablet Take 3 tablets (450 mg total) by mouth every morning. 02/12/19   Cottle, Billey Co., MD  dorzolamide-timolol (COSOPT) 22.3-6.8 MG/ML ophthalmic solution Place 1 drop into both eyes 2 (two) times daily.    [provider]  fluticasone (FLONASE) 50 MCG/ACT nasal spray SHAKE LQ AND U 2 SPRAYS IEN D 09/21/18   [provider]  lamoTRIgine (LAMICTAL) 200 MG tablet Take 1 tablet (200 mg total) by mouth daily. 02/12/19   Cottle, Billey Co., MD  latanoprost (XALATAN) 0.005 % ophthalmic solution Place 1 drop into both eyes at bedtime.  [provider]  metFORMIN (GLUCOPHAGE-XR) 500 MG 24 hr tablet Take 500 mg by mouth daily with breakfast.     [provider]  pantoprazole (PROTONIX) 40 MG tablet Take 1 tablet (40 mg total) by mouth 2 (two) times daily before a meal. 07/10/19   Mahala Menghini, PA-C  traZODone (DESYREL) 50 MG tablet Take 1-2 tablets (50-100 mg total) by mouth at bedtime as needed for sleep (insomnia). 02/12/19   Cottle, Billey Co., MD  vitamin B-12 (CYANOCOBALAMIN) 500 MCG tablet Take 500 mcg every evening by mouth.    [provider]  [name] capsule TAKE 1 CAPSULE(1.5 MG) BY MOUTH DAILY 07/02/19   Cottle, Billey Co., MD    Physical Exam: Vitals:   07/31/19 0214  BP: (!) 188/80  Pulse: 60  Resp: 17  Temp: 98.1 F (36.7 C)  TempSrc: Oral  SpO2: 98%    Constitutional: NAD, calm, comfortable Eyes: PERRL, lids and conjunctivae normal ENMT: Mucous membranes are mildly dry. Posterior pharynx clear of any exudate or lesions. Neck: normal, supple, no masses, no  thyromegaly Respiratory: clear to auscultation bilaterally, no wheezing, no crackles. Normal respiratory effort. No accessory muscle use.  Cardiovascular: Regular rate and rhythm, no murmurs / rubs / gallops. No extremity edema. 2+ pedal pulses. No carotid bruits.  Abdomen: Mildly distended. Soft, mild RLQ tenderness, no masses palpated. No hepatosplenomegaly. Bowel sounds positive.  Musculoskeletal: no clubbing / cyanosis. Good ROM, no contractures. Normal muscle tone.  Skin: no rashes, lesions, ulcers on very limited exam. Neurologic: CN 2-12 grossly intact. Sensation intact, DTR normal. Strength 5/5 in all 4.  Psychiatric: Normal judgment and insight. Alert and oriented x 3. Normal mood.   Labs on Admission: I have personally reviewed following labs and imaging studies  CBC: Recent Labs  Lab 07/31/19 0235  WBC 9.6  NEUTROABS 7.9*  HGB 16.9  HCT 48.0  MCV 85.7  PLT 469   Basic Metabolic Panel: Recent Labs  Lab 07/31/19 0235  NA 137  K 4.0  CL 102  CO2 26  GLUCOSE 114*  BUN 20  CREATININE 0.84  CALCIUM 9.4   GFR: CrCl cannot be calculated (Unknown ideal weight.). Liver Function Tests: Recent Labs  Lab 07/31/19 0235  AST 19  ALT 16  ALKPHOS 100  BILITOT 0.8  PROT 7.8  ALBUMIN 4.5   Recent Labs  Lab 07/31/19 0235  LIPASE 25   No results for input(s): AMMONIA in the last 168 hours. Coagulation Profile: No results for input(s): INR, PROTIME in the last 168 hours. Cardiac Enzymes: No results for input(s): CKTOTAL, CKMB, CKMBINDEX, TROPONINI in the last 168 hours. BNP (last 3 results) No results for input(s): PROBNP in the last 8760 hours. HbA1C: No results for input(s): HGBA1C in the last 72 hours. CBG: No results for input(s): GLUCAP in the last 168 hours. Lipid Profile: No results for input(s): CHOL, HDL, LDLCALC, TRIG, CHOLHDL, LDLDIRECT in the last 72 hours. Thyroid Function Tests: No results for input(s): TSH, T4TOTAL, FREET4, T3FREE, THYROIDAB in  the last 72 hours. Anemia Panel: No results for input(s): VITAMINB12, FOLATE, FERRITIN, TIBC, IRON, RETICCTPCT in the last 72 hours. Urine analysis:    Component Value Date/Time   COLORURINE YELLOW 02/25/2017 1337   APPEARANCEUR CLEAR 02/25/2017 1337   LABSPEC 1.025 02/25/2017 1337   PHURINE 5.0 02/25/2017 1337   GLUCOSEU NEGATIVE 02/25/2017 1337   HGBUR NEGATIVE 02/25/2017 1337   BILIRUBINUR NEGATIVE 02/25/2017 [place] 02/25/2017 1337  PROTEINUR NEGATIVE 02/25/2017 1337   UROBILINOGEN 0.2 12/14/2014 1345   NITRITE NEGATIVE 02/25/2017 1337   LEUKOCYTESUR NEGATIVE 02/25/2017 1337    Radiological Exams on Admission: Ct Abdomen Pelvis W Contrast  Result Date: 07/31/2019 CLINICAL DATA:  68 year old male with abdominal pain, nausea vomiting. Concern for bowel obstruction. EXAM: CT ABDOMEN AND PELVIS WITH CONTRAST TECHNIQUE: Multidetector CT imaging of the abdomen and pelvis was performed using the standard protocol following bolus administration of intravenous contrast. CONTRAST:  147mL OMNIPAQUE IOHEXOL 300 MG/ML  SOLN COMPARISON:  CT of the abdomen pelvis dated 02/25/2017 FINDINGS: Lower chest: The visualized lung bases are unremarkable. No intra-abdominal free air. Small free fluid the pelvis. Hepatobiliary: No focal liver abnormality is seen. No gallstones, gallbladder wall thickening, or biliary dilatation. Pancreas: Unremarkable. No pancreatic ductal dilatation or surrounding inflammatory changes. Spleen: Normal in size without focal abnormality. Adrenals/Urinary Tract: The adrenal glands are unremarkable. Multiple left renal cysts measure up to 3 cm. Several subcentimeter hypodensities are too small to characterize. There is no hydronephrosis on either side. There is symmetric enhancement and excretion of contrast by both kidneys. The visualized ureters and urinary bladder appear unremarkable. Stomach/Bowel: The stomach is distended. There multiple dilated loops of small  bowel throughout the abdomen measuring up to 3.4 cm in diameter. The distal small bowel is collapsed. A point of transition in the right lower abdomen (series 2, image [id number]) were there is abutment of adjacent loops of small bowel, likely related to adhesions. There is postsurgical changes of proximal colon resection with right upper quadrant ileocolic anastomosis. No evidence of obstruction at the level of anastomosis. There is loose stool in the proximal colon. Appendectomy. Vascular/Lymphatic: Mild aortoiliac atherosclerotic disease. No portal venous gas. There is no adenopathy. Reproductive: The prostate and seminal vesicles are grossly unremarkable. No pelvic mass. Other: Midline anterior abdominal wall surgical scar. Musculoskeletal: Degenerative changes of the spine. No acute osseous pathology. IMPRESSION: 1. Small-bowel obstruction with transition zone in the right lower abdomen likely related to adhesions. 2. Postsurgical changes of proximal colon resection with right upper quadrant ileocolic anastomosis. No evidence of obstruction at the level of anastomosis. 3. Aortic Atherosclerosis (ICD10-I70.0). Electronically Signed   By: Anner Crete M.D.   On: 07/31/2019 03:59   Dg Abd 2 Views  Result Date: 07/31/2019 CLINICAL DATA:  68 year old male with abdominal pain and bloating with nausea vomiting. EXAM: ABDOMEN - 2 VIEW COMPARISON:  KUB 10/05/2007.  CT Abdomen and Pelvis 02/25/2017. The FINDINGS: Upright and supine views. No pneumoperitoneum. Negative lung bases. Air-fluid level in the stomach, and multiple small-bowel air-fluid levels with small-bowel loops dilated up to 40-45 millimeters diameter. Stable right abdominal surgical clips and staple line. Paucity of large bowel gas. No acute osseous abnormality identified. IMPRESSION: 1. Positive for small bowel obstruction. No free air. 2. Previous right hemicolectomy. Electronically Signed   By: Genevie Ann M.D.   On: 07/31/2019 03:34    EKG:  Independently reviewed.   Assessment/Plan Principal Problem:   Small bowel obstruction (HCC) Observation/MedSurg. Keep n.p.o. Continue IV fluids. Continue NG tube with intermittent suction. Antiemetics as needed. Analgesics as needed. Follow-up CBC and chemistry daily. Consult general surgery.  Active Problems:   Glaucoma Continue Alphagan, Cosopt and Xalatan drops.    Type 2 diabetes mellitus (HCC) Currently n.p.o. CBG monitoring every 6 hours. Begin regular insulin sliding scale if needed.     GERD (gastroesophageal reflux disease) Protonix 40 mg IVP every 24 hours.    Bipolar disorder, unspecified ([place]) Will need  med reconciliation. Last was on Wellbutrin, Lamictal and trazodone.     Sleep apnea NG tube may preclude him from using CPAP at bedtime Supplemental oxygen at bedtime.    DVT prophylaxis: SCDs. Code Status: Full code. Family Communication: Disposition Plan: Observation for IV hydration, pain control and surgical evaluation. Consults called: Routine general surgery consult. Admission status: Observation/MedSurg.   Reubin Milan MD [facility]  If 7PM-7AM, please contact night-coverage [email or web address]  07/31/2019, 4:32 AM   This document was prepared using Dragon voice recognition software and may contain some unintended transcription errors.

## 2019-07-31 NOTE — Progress Notes (Signed)
[facility]  Full consult note to follow. SBO with adhesions in the right lower quadrant. Labs wnl, abdomen distended and minimally tender. Npo NG.    Curlene Labrum, MD [facility] [address] [place],  [id number] [phone] (office)

## 2019-07-31 NOTE — ED Triage Notes (Signed)
Pt C/O generalized abdominal pain with N/V that began this morning. Denies diarrhea and fever.

## 2019-07-31 NOTE — Progress Notes (Signed)
Patient seen and examined.  Admitted after midnight secondary to abdominal pain, nausea and vomiting.  Work-up demonstrated acute SBO.  Prior history of right hemicolectomy and findings on imaging studies suggesting transition point most likely from adhesions.  NG tube in place, hemodynamically stable and in no acute distress.  After discussing with general surgery plan N.p.o. for medical/conservative management initially and follow response.  We will continue IV fluids, electrolytes repletion, as needed antiemetics and analgesics.  Please refer to H&P written by Dr. Olevia Bowens on 07/31/2019 for further info/details on admission.   Barton Dubois MD [phone]

## 2019-07-31 NOTE — ED Provider Notes (Signed)
[facility] EMERGENCY DEPARTMENT Provider Note   CSN: [id number] Arrival date & time: 07/31/19  0152  Time seen 2:40 AM  History   Chief Complaint Chief Complaint  Patient presents with   Abdominal Pain    HPI [name] is a 68 y.o. male.     HPI patient reports the evening of the ninth he ate almonds which she has not eaten since about February.  He states he has lost 50 pounds since March by not eating out and watching what he is eating.  He states he purposely wanted to lose weight.  He states when he woke up this morning on August 10 he could tell he had eaten too much because he felt full.  He ate some chicken soup for lunch but then he started feeling nauseated.  He tried to eat a milkshake hoping that would help without relief.  He ate cottage cheese for dinner and then at 9 PM he started having nausea and vomited 4 times.  He states each time he vomits he feels a little bit better but then it returns.  He states he feels like he cannot pass gas in his abdomen feels bloated.  He describes diffuse pressure and sometimes sharp pain in his abdomen.  He states he is never had that done before.  He states he had colon surgery and appendectomy done in the past.  PCP Rory Percy, MD   Past Medical History:  Diagnosis Date   Anemia    hx of    Arthritis    fingers    BPH (benign prostatic hyperplasia)    Colon cancer ([place])    stage II, diagnosed 7035   Complication of anesthesia    vagal response after umbilcal hernia at [facility]   Depression    Diabetes mellitus    no as of 09/08/16 due to weight loss    GERD (gastroesophageal reflux disease)    Glaucoma 2012   History of kidney stones    Sleep apnea    wears CiPaP at night   Staph infection 2008   left side of face    Patient Active Problem List   Diagnosis Date Noted   d 09/12/2018   Major depressive disorder, recurrent episode ([place]) 09/12/2018   Bipolar disorder, unspecified ([place])  09/12/2018   Nausea without vomiting 09/19/2017   Rectal pain 10/27/2016   BPH (benign prostatic hypertrophy) with urinary obstruction 00/93/8182   Helicobacter pylori gastritis 02/23/2016   Esophageal mass    Mucosal abnormality of stomach    GERD (gastroesophageal reflux disease) 10/20/2015   Abnormal liver function tests 10/20/2015   Esophageal dysphagia 10/20/2015   Fatty liver 10/20/2015   Chest pain 12/21/2012   Type 2 diabetes mellitus ([place]) 12/21/2012   Change in stool 04/25/2012   Musculoskeletal pain 04/20/2012   Strain of hip adductor muscle 04/20/2012   Hip pain 04/20/2012   GASTROESOPHAGEAL REFLUX DISEASE, HX OF 04/20/2010   Malignant neoplasm of colon ([place]) 08/14/2009   VITAMIN B12 DEFICIENCY 08/14/2009   OBESITY 08/14/2009    Past Surgical History:  Procedure Laterality Date   APPENDECTOMY     CARDIAC CATHETERIZATION     COLECTOMY  2007   right hemicolectomy   COLONOSCOPY  05/11/10   normal rectum,normal residual colon with some angry appearring anastomtic mucosa with some erosions and oozing, bx unremarkable   COLONOSCOPY  05/16/2012   Dr. Gala Romney- normal rectum, sigmoid diverticulosis, anastomotic ulcers. bx= acute ulcer and benign colonic mucosa with  intramucosal lympoid aggregates   COLONOSCOPY N/A 11/17/2016   Procedure: COLONOSCOPY;  Surgeon: Daneil Dolin, MD;  Location: [facility];  Service: Endoscopy;  Laterality: N/A;  100   ESOPHAGEAL DILATION N/A 10/28/2015   Procedure: ESOPHAGEAL DILATION;  Surgeon: Daneil Dolin, MD;  Location: [facility];  Service: Endoscopy;  Laterality: N/A;   ESOPHAGOGASTRODUODENOSCOPY N/A 10/28/2015   RMR: ulcerated proximal esophageal mass ad described status post biopsy. Abnormal distal esophagus biopsy. status post biopsy. Hiatal hernia . abnormal gastric mucosa doubt clinical significance status post biopsy   ESOPHAGOGASTRODUODENOSCOPY (EGD) WITH PROPOFOL N/A 11/07/2017   Procedure:  ESOPHAGOGASTRODUODENOSCOPY (EGD) WITH PROPOFOL;  Surgeon: Daneil Dolin, MD;  Location: [facility] END[facility];  Service: Endoscopy;  Laterality: N/A;  8:15am   EUS N/A 11/20/2015   Procedure: UPPER ENDOSCOPIC ULTRASOUND (EUS) LINEAR;  Surgeon: Milus Banister, MD;  Location: [facility] ENDOSCOPY;  Service: Endoscopy;  Laterality: N/A;   HEMORRHOID SURGERY  12/31/2011   Procedure: HEMORRHOIDECTOMY;  Surgeon: Jamesetta So;  Location: [facility] ORS;  Service: General;  Laterality: N/A;   HERNIA REPAIR     umbilical   KIDNEY STONE SURGERY     PILONIDAL CYST / SINUS EXCISION  1976   SKIN LESION EXCISION     left shoulder/pre cancerous   TRANSURETHRAL RESECTION OF PROSTATE N/A 09/09/2016   Procedure: TRANSURETHRAL RESECTION OF THE PROSTATE (TURP);  Surgeon: Irine Seal, MD;  Location: [facility] ORS;  Service: Urology;  Laterality: N/A;        Home Medications    Prior to Admission medications   Medication Sig Start Date End Date Taking? Authorizing Provider  brimonidine (ALPHAGAN) 0.2 % ophthalmic solution Place 1 drop into both eyes 2 (two) times daily.     [provider]  buPROPion (WELLBUTRIN XL) 150 MG 24 hr tablet Take 3 tablets (450 mg total) by mouth every morning. 02/12/19   Cottle, Billey Co., MD  dorzolamide-timolol (COSOPT) 22.3-6.8 MG/ML ophthalmic solution Place 1 drop into both eyes 2 (two) times daily.    [provider]  fluticasone (FLONASE) 50 MCG/ACT nasal spray SHAKE LQ AND U 2 SPRAYS IEN D 09/21/18   [provider]  lamoTRIgine (LAMICTAL) 200 MG tablet Take 1 tablet (200 mg total) by mouth daily. 02/12/19   Cottle, Billey Co., MD  latanoprost (XALATAN) 0.005 % ophthalmic solution Place 1 drop into both eyes at bedtime.    [provider]  metFORMIN (GLUCOPHAGE-XR) 500 MG 24 hr tablet Take 500 mg by mouth daily with breakfast.     [provider]  pantoprazole (PROTONIX) 40 MG tablet Take 1 tablet (40 mg total) by mouth 2 (two) times daily before a  meal. 07/10/19   Mahala Menghini, PA-C  traZODone (DESYREL) 50 MG tablet Take 1-2 tablets (50-100 mg total) by mouth at bedtime as needed for sleep (insomnia). 02/12/19   Cottle, Billey Co., MD  vitamin B-12 (CYANOCOBALAMIN) 500 MCG tablet Take 500 mcg every evening by mouth.    [provider]  [name] capsule TAKE 1 CAPSULE(1.5 MG) BY MOUTH DAILY 07/02/19   Cottle, Billey Co., MD    Family History Family History  Problem Relation Age of Onset   Lung cancer Father    Diabetes Mother    Anesthesia problems Neg Hx    Hypotension Neg Hx    Malignant hyperthermia Neg Hx    Pseudochol deficiency Neg Hx     Social History Social History   Tobacco Use  Smoking status: Never Smoker   Smokeless tobacco: Never Used   Tobacco comment: Never smoked  Substance Use Topics   Alcohol use: No    Alcohol/week: 0.0 standard drinks   Drug use: No  lives at home Lives with spouse   Allergies   Ambien [zolpidem tartrate], Bactrim [sulfamethoxazole-trimethoprim], Dilaudid [hydromorphone hcl], and Morphine and related   Review of Systems Review of Systems  All other systems reviewed and are negative.    Physical Exam Updated Vital Signs BP (!) 188/80 (BP Location: Left Arm)    Pulse 60    Temp 98.1 F (36.7 C) (Oral)    Resp 17    SpO2 98%   Physical Exam Vitals signs and nursing note reviewed.  Constitutional:      General: He is not in acute distress.    Appearance: He is well-developed.  HENT:     Head: Normocephalic and atraumatic.     Right Ear: External ear normal.     Left Ear: External ear normal.     Nose: Nose normal.     Mouth/Throat:     Mouth: Mucous membranes are dry.     Pharynx: No oropharyngeal exudate or posterior oropharyngeal erythema.  Eyes:     Extraocular Movements: Extraocular movements intact.     Conjunctiva/sclera: Conjunctivae normal.     Pupils: Pupils are equal, round, and reactive to light.  Neck:     Musculoskeletal: Normal  range of motion and neck supple.  Cardiovascular:     Rate and Rhythm: Normal rate and regular rhythm.  Pulmonary:     Effort: Pulmonary effort is normal. No respiratory distress.  Abdominal:     General: Bowel sounds are increased. There is distension.     Tenderness: There is generalized abdominal tenderness. There is no guarding or rebound.     Comments: Bowel sounds increased especially in the left lower abdomen  Musculoskeletal: Normal range of motion.        General: No deformity.  Skin:    General: Skin is warm and dry.     Findings: No erythema.  Neurological:     General: No focal deficit present.     Mental Status: He is alert and oriented to person, place, and time.     Cranial Nerves: No cranial nerve deficit.  Psychiatric:        Mood and Affect: Mood normal.        Behavior: Behavior normal.        Thought Content: Thought content normal.      ED Treatments / Results  Labs (all labs ordered are listed, but only abnormal results are displayed) Results for orders placed or performed during the hospital encounter of 07/31/19  Comprehensive metabolic panel  Result Value Ref Range   Sodium 137 135 - 145 mmol/L   Potassium 4.0 3.5 - 5.1 mmol/L   Chloride 102 98 - 111 mmol/L   CO2 26 22 - 32 mmol/L   Glucose, Bld 114 (H) 70 - 99 mg/dL   BUN 20 8 - 23 mg/dL   Creatinine, Ser 0.84 0.61 - 1.24 mg/dL   Calcium 9.4 8.9 - 10.3 mg/dL   Total Protein 7.8 6.5 - 8.1 g/dL   Albumin 4.5 3.5 - 5.0 g/dL   AST 19 15 - 41 U/L   ALT 16 0 - 44 U/L   Alkaline Phosphatase 100 38 - 126 U/L   Total Bilirubin 0.8 0.3 - 1.2 mg/dL   GFR calc non Af  Amer >60 >60 mL/min   GFR calc Af Amer >60 >60 mL/min   Anion gap 9 5 - 15  Lipase, blood  Result Value Ref Range   Lipase 25 11 - 51 U/L  CBC with Differential  Result Value Ref Range   WBC 9.6 4.0 - 10.5 K/uL   RBC 5.60 4.22 - 5.81 MIL/uL   Hemoglobin 16.9 13.0 - 17.0 g/dL   HCT 48.0 39.0 - 52.0 %   MCV 85.7 80.0 - 100.0 fL   MCH  30.2 26.0 - 34.0 pg   MCHC 35.2 30.0 - 36.0 g/dL   RDW 12.5 11.5 - 15.5 %   Platelets 168 150 - 400 K/uL   nRBC 0.0 0.0 - 0.2 %   Neutrophils Relative % 82 %   Neutro Abs 7.9 (H) 1.7 - 7.7 K/uL   Lymphocytes Relative 8 %   Lymphs Abs 0.7 0.7 - 4.0 K/uL   Monocytes Relative 10 %   Monocytes Absolute 0.9 0.1 - 1.0 K/uL   Eosinophils Relative 0 %   Eosinophils Absolute 0.0 0.0 - 0.5 K/uL   Basophils Relative 0 %   Basophils Absolute 0.0 0.0 - 0.1 K/uL   Immature Granulocytes 0 %   Abs Immature Granulocytes 0.02 0.00 - 0.07 K/uL   Laboratory interpretation all normal except mild hyperglycemia, nonfasting    EKG None  Radiology Ct Abdomen Pelvis W Contrast  Result Date: 07/31/2019 CLINICAL DATA:  68 year old male with abdominal pain, nausea vomiting. Concern for bowel obstruction. EXAM: CT ABDOMEN AND PELVIS WITH CONTRAST TECHNIQUE: Multidetector CT imaging of the abdomen and pelvis was performed using the standard protocol following bolus administration of intravenous contrast. CONTRAST:  141mL OMNIPAQUE IOHEXOL 300 MG/ML  SOLN COMPARISON:  CT of the abdomen pelvis dated 02/25/2017 FINDINGS: Lower chest: The visualized lung bases are unremarkable. No intra-abdominal free air. Small free fluid the pelvis. Hepatobiliary: No focal liver abnormality is seen. No gallstones, gallbladder wall thickening, or biliary dilatation. Pancreas: Unremarkable. No pancreatic ductal dilatation or surrounding inflammatory changes. Spleen: Normal in size without focal abnormality. Adrenals/Urinary Tract: The adrenal glands are unremarkable. Multiple left renal cysts measure up to 3 cm. Several subcentimeter hypodensities are too small to characterize. There is no hydronephrosis on either side. There is symmetric enhancement and excretion of contrast by both kidneys. The visualized ureters and urinary bladder appear unremarkable. Stomach/Bowel: The stomach is distended. There multiple dilated loops of small bowel  throughout the abdomen measuring up to 3.4 cm in diameter. The distal small bowel is collapsed. A point of transition in the right lower abdomen (series 2, image [id number]) were there is abutment of adjacent loops of small bowel, likely related to adhesions. There is postsurgical changes of proximal colon resection with right upper quadrant ileocolic anastomosis. No evidence of obstruction at the level of anastomosis. There is loose stool in the proximal colon. Appendectomy. Vascular/Lymphatic: Mild aortoiliac atherosclerotic disease. No portal venous gas. There is no adenopathy. Reproductive: The prostate and seminal vesicles are grossly unremarkable. No pelvic mass. Other: Midline anterior abdominal wall surgical scar. Musculoskeletal: Degenerative changes of the spine. No acute osseous pathology. IMPRESSION: 1. Small-bowel obstruction with transition zone in the right lower abdomen likely related to adhesions. 2. Postsurgical changes of proximal colon resection with right upper quadrant ileocolic anastomosis. No evidence of obstruction at the level of anastomosis. 3. Aortic Atherosclerosis (ICD10-I70.0). Electronically Signed   By: Anner Crete M.D.   On: 07/31/2019 03:59   Dg Abd 2 Views  Result  Date: 07/31/2019 CLINICAL DATA:  68 year old male with abdominal pain and bloating with nausea vomiting. EXAM: ABDOMEN - 2 VIEW COMPARISON:  [facility] 10/05/2007.  CT Abdomen and Pelvis 02/25/2017. The FINDINGS: Upright and supine views. No pneumoperitoneum. Negative lung bases. Air-fluid level in the stomach, and multiple small-bowel air-fluid levels with small-bowel loops dilated up to 40-45 millimeters diameter. Stable right abdominal surgical clips and staple line. Paucity of large bowel gas. No acute osseous abnormality identified. IMPRESSION: 1. Positive for small bowel obstruction. No free air. 2. Previous right hemicolectomy. Electronically Signed   By: Genevie Ann M.D.   On: 07/31/2019 03:34     Procedures .Critical Care Performed by: Rolland Porter, MD Authorized by: Rolland Porter, MD   Critical care provider statement:    Critical care time (minutes):  33   Critical care was necessary to treat or prevent imminent or life-threatening deterioration of the following conditions: Bowel perforation, aspiration.   Critical care was time spent personally by me on the following activities:  Discussions with consultants, examination of patient, obtaining history from patient or surrogate, ordering and review of laboratory studies, ordering and review of radiographic studies and re-evaluation of patient's condition   (including critical care time)  Medications Ordered in ED Medications  sodium chloride 0.9 % bolus 1,000 mL (1,000 mLs Intravenous New Bag/Given 07/31/19 0332)  ondansetron (ZOFRAN) injection 4 mg (4 mg Intravenous Given 07/31/19 0330)  fentaNYL (SUBLIMAZE) injection 50 mcg (50 mcg Intravenous Given 07/31/19 0330)  iohexol (OMNIPAQUE) 300 MG/ML solution 100 mL (100 mLs Intravenous Contrast Given 07/31/19 0339)     Initial Impression / Assessment and Plan / ED Course  I have reviewed the triage vital signs and the nursing notes.  Pertinent labs & imaging results that were available during my care of the patient were reviewed by me and considered in my medical decision making (see chart for details).    Patient was given IV fluids for his dehydration, he was given pain and nausea medication.  I reviewed his x-rays which was consistent with a SBO.  CT of the abdomen and pelvis with contrast was ordered.  NG tube was ordered.  I talked to the patient at 03:25 and showed him his x-ray results.  We discussed the need for an NG tube and COVID testing.  He understands he will need to be admitted.  He understands that the CT will help me decide whether he needs a medical admission or surgical admission.  He states his wife told him if he needed to be admitted for surgery he should go to  [place].  NG tube was ordered.  Recheck at 4:15 AM I talked to the patient about his CT results which verifies his small bowel obstruction.  However there is no perforation or free fluid that would necessitate him having emergent surgery tonight.  He states that he is going to go by his wife's wishes and if he needed surgery he would want to go to [place].  He is agreeable to be admitted here however until that decision is made.  4:22 AM Dr. Olevia Bowens, hospitalist will admit.  He is aware of patient's request that if he needs surgery he would need to be transferred to [facility].  Final Clinical Impressions(s) / ED Diagnoses   Final diagnoses:  SBO (small bowel obstruction) ([place])    Plan admission  Rolland Porter, MD, Barbette Or, MD 07/31/19 [phone]

## 2019-08-01 LAB — BASIC METABOLIC PANEL
Anion gap: 8 (ref 5–15)
BUN: 11 mg/dL (ref 8–23)
CO2: 25 mmol/L (ref 22–32)
Calcium: 8.9 mg/dL (ref 8.9–10.3)
Chloride: 105 mmol/L (ref 98–111)
Creatinine, Ser: 0.7 mg/dL (ref 0.61–1.24)
GFR calc Af Amer: >60 mL/min (ref >60)
GFR calc non Af Amer: >60 mL/min (ref >60)
Glucose, Bld: 82 mg/dL (ref 70–99)
Potassium: 4.1 mmol/L (ref 3.5–5.1)
Sodium: 138 mmol/L (ref 135–145)

## 2019-08-01 LAB — GLUCOSE, CAPILLARY
Glucose-Capillary: 73 mg/dL (ref 70–99)
Glucose-Capillary: 78 mg/dL (ref 70–99)
Glucose-Capillary: 79 mg/dL (ref 70–99)
Glucose-Capillary: 85 mg/dL (ref 70–99)

## 2019-08-01 LAB — CBC
HCT: 43.7 % (ref 39.0–52.0)
Hemoglobin: 15.1 g/dL (ref 13.0–17.0)
MCH: 30 pg (ref 26.0–34.0)
MCHC: 34.6 g/dL (ref 30.0–36.0)
MCV: 86.7 fL (ref 80.0–100.0)
Platelets: 159 10*3/uL (ref 150–400)
RBC: 5.04 MIL/uL (ref 4.22–5.81)
RDW: 12.7 % (ref 11.5–15.5)
WBC: 6.8 10*3/uL (ref 4.0–10.5)
nRBC: 0 % (ref 0.0–0.2)

## 2019-08-01 LAB — MAGNESIUM: Magnesium: 2 mg/dL (ref 1.7–2.4)

## 2019-08-01 LAB — PHOSPHORUS: Phosphorus: 3.4 mg/dL (ref 2.5–4.6)

## 2019-08-01 MED ORDER — TRAZODONE HCL 50 MG PO TABS
50.0000 mg | ORAL_TABLET | Freq: Every evening | ORAL | Status: DC | PRN
  Administered 2019-08-01 – 2019-08-02 (×4): 50 mg via ORAL
  Filled 2019-08-01 (×4): qty 1

## 2019-08-01 MED ORDER — LORAZEPAM 2 MG/ML IJ SOLN
0.5000 mg | Freq: Once | INTRAMUSCULAR | Status: AC
  Administered 2019-08-01: 0.5 mg via INTRAVENOUS
  Filled 2019-08-01: qty 1

## 2019-08-01 NOTE — Progress Notes (Signed)
PROGRESS NOTE  [name]:[id number] DOB: Dec 26, 1950 DOA: 07/31/2019 PCP: Rory Percy, MD  Brief History:  68 year old male with a history of depression, stage II colon cancer status post right hemicolectomy, OSA, and GERD presenting with 1 day history of abdominal pain with associated nausea and vomiting.  He had denied any fevers, chills, chest pain, shortness breath, dysuria, hematuria, hematochezia, melena.  His last bowel movement was on the morning of 07/30/2019.  Upon presentation, CT of the abdomen and pelvis showed small bowel obstruction with a transition zone in the right lower quadrant likely related to adhesions.  There is a postsurgical change of prior colon resection with R- upper quadrant ileocolic anastomosis.  General surgery was consulted.  NG was placed to low intermittent suction.  The patient was made n.p.o. and started on IV fluids.  Assessment/Plan: Small bowel obstruction -Remain n.p.o. -Continue IV fluids -Continue NG decompression -Continue ambulation -Optimize electrolytes  Bipolar disorder, type unspecified -Restart Wellbutrin, Lamictal, and [name] when able to tolerate po  Diabetes mellitus type 2 -Resolved after losing 50 pounds  Glaucoma -Continue Alphagan, Cosopt, Xalatan       Disposition Plan:   Home in 2-3 days when cleared by surgery Family Communication: No  Family at bedside  Consultants:  General surgery  Code Status:  FULL  DVT Prophylaxis:  SCDs   Procedures: As Listed in Progress Note Above  Antibiotics: None      Subjective: Patient is passing flatus but no bowel movements yet.  He denies any nausea vomiting or abdominal pain.  He is feeling better.  He denies any fevers, chills, chest pain, shortness breath, cough, hemoptysis, diarrhea, hematochezia, melena.  Objective: Vitals:   07/31/19 2034 07/31/19 2101 08/01/19 0553 08/01/19 1308  BP:  123/74 139/73 128/69  Pulse:  61 (!) 58 62  Resp:  16  16 16   Temp:  98.6 F (37 C) 98.7 F (37.1 C) 98.5 F (36.9 C)  TempSrc:  Oral Oral Oral  SpO2: 97% 95% 93% 94%  Weight:      Height:        Intake/Output Summary (Last 24 hours) at 08/01/2019 1645 Last data filed at 08/01/2019 1300 Gross per 24 hour  Intake 1497.96 ml  Output 1100 ml  Net 397.96 ml   Weight change:  Exam:   General:  Pt is alert, follows commands appropriately, not in acute distress  HEENT: No icterus, No thrush, No neck mass, [place]/AT  Cardiovascular: RRR, S1/S2, no rubs, no gallops  Respiratory: CTA bilaterally, no wheezing, no crackles, no rhonchi  Abdomen: Soft/+BS, non tender, non distended, no guarding  Extremities: No edema, No lymphangitis, No petechiae, No rashes, no synovitis   Data Reviewed: I have personally reviewed following labs and imaging studies Basic Metabolic Panel: Recent Labs  Lab 07/31/19 0235 07/31/19 0608 08/01/19 0618  NA 137 138 138  K 4.0 4.1 4.1  CL 102 104 105  CO2 26 26 25   GLUCOSE 114* 105* 82  BUN 20 17 11   CREATININE 0.84 0.86 0.70  CALCIUM 9.4 8.8* 8.9  MG 2.2  --  2.0  PHOS 3.5  --  3.4   Liver Function Tests: Recent Labs  Lab 07/31/19 0235 07/31/19 0608  AST 19 17  ALT 16 14  ALKPHOS 100 84  BILITOT 0.8 0.8  PROT 7.8 6.8  ALBUMIN 4.5 3.8   Recent Labs  Lab 07/31/19 0235  LIPASE 25   No  results for input(s): AMMONIA in the last 168 hours. Coagulation Profile: No results for input(s): INR, PROTIME in the last 168 hours. CBC: Recent Labs  Lab 07/31/19 0235 07/31/19 0608 08/01/19 0618  WBC 9.6 7.5 6.8  NEUTROABS 7.9*  --   --   HGB 16.9 15.1 15.1  HCT 48.0 43.4 43.7  MCV 85.7 85.6 86.7  PLT 168 159 159   Cardiac Enzymes: No results for input(s): CKTOTAL, CKMB, CKMBINDEX, TROPONINI in the last 168 hours. BNP: Invalid input(s): POCBNP CBG: Recent Labs  Lab 07/31/19 1628 07/31/19 2104 08/01/19 0047 08/01/19 0802 08/01/19 1104  GLUCAP 94 87 85 79 78   HbA1C: No results for  input(s): HGBA1C in the last 72 hours. Urine analysis:    Component Value Date/Time   COLORURINE YELLOW 02/25/2017 1337   APPEARANCEUR CLEAR 02/25/2017 1337   LABSPEC 1.025 02/25/2017 1337   PHURINE 5.0 02/25/2017 1337   GLUCOSEU NEGATIVE 02/25/2017 1337   HGBUR NEGATIVE 02/25/2017 1337   BILIRUBINUR NEGATIVE 02/25/2017 1337   KETONESUR NEGATIVE 02/25/2017 1337   PROTEINUR NEGATIVE 02/25/2017 1337   UROBILINOGEN 0.2 12/14/2014 1345   NITRITE NEGATIVE 02/25/2017 1337   LEUKOCYTESUR NEGATIVE 02/25/2017 1337   Sepsis Labs: @LABRCNTIP (procalcitonin:4,lacticidven:4) ) Recent Results (from the past 240 hour(s))  SARS Coronavirus 2 [facility] order, Performed in [facility] hospital lab) Nasopharyngeal Nasopharyngeal Swab     Status: None   Collection Time: 07/31/19  4:13 AM   Specimen: Nasopharyngeal Swab  Result Value Ref Range Status   SARS Coronavirus 2 NEGATIVE NEGATIVE Final    Comment: (NOTE) If result is NEGATIVE SARS-CoV-2 target nucleic acids are NOT DETECTED. The SARS-CoV-2 RNA is generally detectable in upper and lower  respiratory specimens during the acute phase of infection. The lowest  concentration of SARS-CoV-2 viral copies this assay can detect is 250  copies / mL. A negative result does not preclude SARS-CoV-2 infection  and should not be used as the sole basis for treatment or other  patient management decisions.  A negative result may occur with  improper specimen collection / handling, submission of specimen other  than nasopharyngeal swab, presence of viral mutation(s) within the  areas targeted by this assay, and inadequate number of viral copies  (<250 copies / mL). A negative result must be combined with clinical  observations, patient history, and epidemiological information. If result is POSITIVE SARS-CoV-2 target nucleic acids are DETECTED. The SARS-CoV-2 RNA is generally detectable in upper and lower  respiratory specimens dur ing the acute phase of  infection.  Positive  results are indicative of active infection with SARS-CoV-2.  Clinical  correlation with patient history and other diagnostic information is  necessary to determine patient infection status.  Positive results do  not rule out bacterial infection or co-infection with other viruses. If result is PRESUMPTIVE POSTIVE SARS-CoV-2 nucleic acids MAY BE PRESENT.   A presumptive positive result was obtained on the submitted specimen  and confirmed on repeat testing.  While 2019 novel coronavirus  (SARS-CoV-2) nucleic acids may be present in the submitted sample  additional confirmatory testing may be necessary for epidemiological  and / or clinical management purposes  to differentiate between  SARS-CoV-2 and other Sarbecovirus currently known to infect humans.  If clinically indicated additional testing with an alternate test  methodology [phone]) is advised. The SARS-CoV-2 RNA is generally  detectable in upper and lower respiratory sp ecimens during the acute  phase of infection. The expected result is Negative. Fact Sheet for Patients:  StrictlyIdeas.no Fact Sheet for Healthcare Providers: BankingDealers.co.za This test is not yet approved or cleared by the [place] FDA and has been authorized for detection and/or diagnosis of SARS-CoV-2 by FDA under an Emergency Use Authorization (EUA).  This EUA will remain in effect (meaning this test can be used) for the duration of the COVID-19 declaration under Section 564(b)(1) of the Act, 21 U.S.C. section 360bbb-3(b)(1), unless the authorization is terminated or revoked sooner. Performed at [facility], [address] [place], [place] [address]      Scheduled Meds: . brimonidine  1 drop Both Eyes BID  . dorzolamide-timolol  1 drop Both Eyes BID  . latanoprost  1 drop Both Eyes QHS  . pantoprazole (PROTONIX) IV  40 mg Intravenous Q24H   Continuous Infusions: . 0.9 %  NaCl with KCl 20 mEq / L 100 mL/hr at 08/01/19 3875    Procedures/Studies: Ct Abdomen Pelvis W Contrast  Result Date: 07/31/2019 CLINICAL DATA:  68 year old male with abdominal pain, nausea vomiting. Concern for bowel obstruction. EXAM: CT ABDOMEN AND PELVIS WITH CONTRAST TECHNIQUE: Multidetector CT imaging of the abdomen and pelvis was performed using the standard protocol following bolus administration of intravenous contrast. CONTRAST:  175mL OMNIPAQUE IOHEXOL 300 MG/ML  SOLN COMPARISON:  CT of the abdomen pelvis dated 02/25/2017 FINDINGS: Lower chest: The visualized lung bases are unremarkable. No intra-abdominal free air. Small free fluid the pelvis. Hepatobiliary: No focal liver abnormality is seen. No gallstones, gallbladder wall thickening, or biliary dilatation. Pancreas: Unremarkable. No pancreatic ductal dilatation or surrounding inflammatory changes. Spleen: Normal in size without focal abnormality. Adrenals/Urinary Tract: The adrenal glands are unremarkable. Multiple left renal cysts measure up to 3 cm. Several subcentimeter hypodensities are too small to characterize. There is no hydronephrosis on either side. There is symmetric enhancement and excretion of contrast by both kidneys. The visualized ureters and urinary bladder appear unremarkable. Stomach/Bowel: The stomach is distended. There multiple dilated loops of small bowel throughout the abdomen measuring up to 3.4 cm in diameter. The distal small bowel is collapsed. A point of transition in the right lower abdomen (series 2, image [id number]) were there is abutment of adjacent loops of small bowel, likely related to adhesions. There is postsurgical changes of proximal colon resection with right upper quadrant ileocolic anastomosis. No evidence of obstruction at the level of anastomosis. There is loose stool in the proximal colon. Appendectomy. Vascular/Lymphatic: Mild aortoiliac atherosclerotic disease. No portal venous gas. There is no  adenopathy. Reproductive: The prostate and seminal vesicles are grossly unremarkable. No pelvic mass. Other: Midline anterior abdominal wall surgical scar. Musculoskeletal: Degenerative changes of the spine. No acute osseous pathology. IMPRESSION: 1. Small-bowel obstruction with transition zone in the right lower abdomen likely related to adhesions. 2. Postsurgical changes of proximal colon resection with right upper quadrant ileocolic anastomosis. No evidence of obstruction at the level of anastomosis. 3. Aortic Atherosclerosis (ICD10-I70.0). Electronically Signed   By: Anner Crete M.D.   On: 07/31/2019 03:59   Dg Chest Portable 1 View  Result Date: 07/31/2019 CLINICAL DATA:  Nasogastric tube placement EXAM: PORTABLE CHEST 1 VIEW COMPARISON:  Abdomen and pelvis CT from earlier today FINDINGS: Nasogastric tube with tip reaching the stomach. Gas and fluid is seen within the stomach. Aortic tortuosity. Normal heart size. There is no edema, consolidation, effusion, or pneumothorax. IMPRESSION: The nasogastric tube tip and side-port reaches the distended stomach. Electronically Signed   By: Monte Fantasia M.D.   On: 07/31/2019 04:31   Dg Abd 2 Views  Result Date: 07/31/2019 CLINICAL DATA:  68 year old male with abdominal pain and bloating with nausea vomiting. EXAM: ABDOMEN - 2 VIEW COMPARISON:  KUB 10/05/2007.  CT Abdomen and Pelvis 02/25/2017. The FINDINGS: Upright and supine views. No pneumoperitoneum. Negative lung bases. Air-fluid level in the stomach, and multiple small-bowel air-fluid levels with small-bowel loops dilated up to 40-45 millimeters diameter. Stable right abdominal surgical clips and staple line. Paucity of large bowel gas. No acute osseous abnormality identified. IMPRESSION: 1. Positive for small bowel obstruction. No free air. 2. Previous right hemicolectomy. Electronically Signed   By: Genevie Ann M.D.   On: 07/31/2019 03:34    Orson Eva, DO  [facility] Pager [phone]   If 7PM-7AM, please contact night-coverage [email or web address] Password [email or web address] 08/01/2019, 4:45 PM   LOS: 1 day

## 2019-08-01 NOTE — Progress Notes (Signed)
Verbal order from Dr. Constance Haw to remove NG tube. Stated if the patient gets sick, nursing staff can reinsert the tube and put on low intermittent suction. Will continue to monitor and make night shift RN aware.

## 2019-08-01 NOTE — Progress Notes (Addendum)
I was present with the medical student for this service. I personally verified the history of present illness, performed the physical exam, and made the plan for this encounter. I have verified the medical student's documentation and made modifications where appropriately. I have personally documented in my own words a brief history, physical, and plan below.     Having some flatus. No nausea/vomiting. He has the NG clamped and has been walking. He is feeling fine. Discussed at length with him and his wife.  Discussed that he is improving and could trial clears. He wants to proceed with this now. Discussed Ng clamp or removal. He is looking to have it removed.  [name] has Xray ordered for AM.   Pending how the patient does, will either hold on SBFT tomorrow or proceed. If he has BM and does well can likely just advance.  Soft, minimally distended, nontender  Spent over 25 minutes discussing things with patient and wife and setting up plan.   Robert Labrum, [name] [facility] [place], [place] [address] [phone] (office)     [name]     Subjective: I found [name] ambulating in the hallway this morning. Patient endorses improvement of his abdominal pain, states he is feeling much better. However, due to his uncomfortable bed, he did not sleep well last night and complains of headache this morning. Patient denies anymore dysuria, but reports that his urine continues to be dark.   Objective: Vital signs in last 24 hours: Temp:  [98.5 F (36.9 C)-98.7 F (37.1 C)] 98.5 F (36.9 C) (08/12 1308) Pulse Rate:  [58-62] 62 (08/12 1308) Resp:  [16] 16 (08/12 1308) BP: (123-139)/(69-74) 128/69 (08/12 1308) SpO2:  [93 %-97 %] 94 % (08/12 1308)    Intake/Output from previous day: 08/11 0701 - 08/12 0700 In: 1498 [I.V.:1498] Out: 1200 [Urine:700; Emesis/NG output:500] Intake/Output this shift: Total  I/O In: 0  Out: 600 [Urine:500; Emesis/NG output:100]  Physical Exam Constitutional:      Appearance: He is well-developed.  HENT:     Head: Normocephalic and atraumatic.  Eyes:     Extraocular Movements: Extraocular movements intact.  Cardiovascular:     Rate and Rhythm: Normal rate and regular rhythm.     Heart sounds: Normal heart sounds. No murmur.  Pulmonary:     Effort: Pulmonary effort is normal.     Breath sounds: Normal breath sounds.  Abdominal:     General: Abdomen is protuberant. A surgical scar is present. Bowel sounds are normal. There is distension.     Palpations: There is no mass.     Tenderness: There is no abdominal tenderness. There is no guarding or rebound.  Skin:    General: Skin is warm and dry.  Neurological:     Mental Status: He is alert.      Lab Results:  Recent Labs    07/31/19 0608 08/01/19 0618  WBC 7.5 6.8  HGB 15.1 15.1  HCT 43.4 43.7  PLT 159 159   BMET Recent Labs    07/31/19 0608 08/01/19 0618  NA 138 138  K 4.1 4.1  CL 104 105  CO2 26 25  GLUCOSE 105* 82  BUN 17 11  CREATININE 0.86 0.70  CALCIUM 8.8* 8.9    Studies/Results: Ct Abdomen Pelvis W Contrast  Result Date: 07/31/2019 CLINICAL DATA:  68 year old male with abdominal pain, nausea vomiting. Concern for bowel obstruction. EXAM: CT ABDOMEN AND PELVIS WITH CONTRAST  TECHNIQUE: Multidetector CT imaging of the abdomen and pelvis was performed using the standard protocol following bolus administration of intravenous contrast. CONTRAST:  154mL OMNIPAQUE IOHEXOL 300 MG/ML  SOLN COMPARISON:  CT of the abdomen pelvis dated 02/25/2017 FINDINGS: Lower chest: The visualized lung bases are unremarkable. No intra-abdominal free air. Small free fluid the pelvis. Hepatobiliary: No focal liver abnormality is seen. No gallstones, gallbladder wall thickening, or biliary dilatation. Pancreas: Unremarkable. No pancreatic ductal dilatation or surrounding inflammatory changes. Spleen: Normal  in size without focal abnormality. Adrenals/Urinary Tract: The adrenal glands are unremarkable. Multiple left renal cysts measure up to 3 cm. Several subcentimeter hypodensities are too small to characterize. There is no hydronephrosis on either side. There is symmetric enhancement and excretion of contrast by both kidneys. The visualized ureters and urinary bladder appear unremarkable. Stomach/Bowel: The stomach is distended. There multiple dilated loops of small bowel throughout the abdomen measuring up to 3.4 cm in diameter. The distal small bowel is collapsed. A point of transition in the right lower abdomen (series 2, image [id number]) were there is abutment of adjacent loops of small bowel, likely related to adhesions. There is postsurgical changes of proximal colon resection with right upper quadrant ileocolic anastomosis. No evidence of obstruction at the level of anastomosis. There is loose stool in the proximal colon. Appendectomy. Vascular/Lymphatic: Mild aortoiliac atherosclerotic disease. No portal venous gas. There is no adenopathy. Reproductive: The prostate and seminal vesicles are grossly unremarkable. No pelvic mass. Other: Midline anterior abdominal wall surgical scar. Musculoskeletal: Degenerative changes of the spine. No acute osseous pathology. IMPRESSION: 1. Small-bowel obstruction with transition zone in the right lower abdomen likely related to adhesions. 2. Postsurgical changes of proximal colon resection with right upper quadrant ileocolic anastomosis. No evidence of obstruction at the level of anastomosis. 3. Aortic Atherosclerosis (ICD10-I70.0). Electronically Signed   By: Anner Crete M.D.   On: 07/31/2019 03:59   Dg Chest Portable 1 View  Result Date: 07/31/2019 CLINICAL DATA:  Nasogastric tube placement EXAM: PORTABLE CHEST 1 VIEW COMPARISON:  Abdomen and pelvis CT from earlier today FINDINGS: Nasogastric tube with tip reaching the stomach. Gas and fluid is seen within the stomach.  Aortic tortuosity. Normal heart size. There is no edema, consolidation, effusion, or pneumothorax. IMPRESSION: The nasogastric tube tip and side-port reaches the distended stomach. Electronically Signed   By: Monte Fantasia M.D.   On: 07/31/2019 04:31   Dg Abd 2 Views  Result Date: 07/31/2019 CLINICAL DATA:  68 year old male with abdominal pain and bloating with nausea vomiting. EXAM: ABDOMEN - 2 VIEW COMPARISON:  KUB 10/05/2007.  CT Abdomen and Pelvis 02/25/2017. The FINDINGS: Upright and supine views. No pneumoperitoneum. Negative lung bases. Air-fluid level in the stomach, and multiple small-bowel air-fluid levels with small-bowel loops dilated up to 40-45 millimeters diameter. Stable right abdominal surgical clips and staple line. Paucity of large bowel gas. No acute osseous abnormality identified. IMPRESSION: 1. Positive for small bowel obstruction. No free air. 2. Previous right hemicolectomy. Electronically Signed   By: Genevie Ann M.D.   On: 07/31/2019 03:34    Anti-infectives: Anti-infectives (From admission, onward)   None       Problem List Items Addressed This Visit      Digestive   SBO (small bowel obstruction) ([place]) - Primary     Assessment/Plan:  MANJINDER BREAU is a 68 y.o. man with significant medical history including colon cancer T3 N0, stage IIs/p right hemicolectomy in 2007, who presented to the ED one day ago  with abdominal pain, nausea, vomiting, and absent flatulence. 07/31/2019 Abdominal CT demonstrates several dilated loops of small bowel, with distal collapse, transition point visualized in right lower abdomen, no obstruction related to the ileocolic anastomosis. Given imaging, patient's symptomology is most consistent with adhesive small bowel obstruction.   Still no evidence of bowel ischemia, necrosis or perforation. Today he endorses improved abdominal pain and bloating.   Plan  -Continuing NPO status -NS at 100 mL/hr  -NG suctioning for gastric and small  bowel decompression -PRN analgesic and antiemetic -Close monitoring for developing signs and symptoms emergent complications -Patient is amenable to surgery here at [facility], so we will precede with plan for SBFT tomorrow and surgery in two days if positive   LOS: 1 day    [name] 08/01/2019

## 2019-08-02 ENCOUNTER — Inpatient Hospital Stay (HOSPITAL_COMMUNITY): Payer: Medicare Other

## 2019-08-02 DIAGNOSIS — F3160 Bipolar disorder, current episode mixed, unspecified: Secondary | ICD-10-CM

## 2019-08-02 LAB — GLUCOSE, CAPILLARY
Glucose-Capillary: 107 mg/dL — ABNORMAL HIGH (ref 70–99)
Glucose-Capillary: 68 mg/dL — ABNORMAL LOW (ref 70–99)
Glucose-Capillary: 87 mg/dL (ref 70–99)
Glucose-Capillary: 97 mg/dL (ref 70–99)

## 2019-08-02 MED ORDER — BISACODYL 10 MG RE SUPP
10.0000 mg | Freq: Once | RECTAL | Status: AC
  Administered 2019-08-02: 10 mg via RECTAL
  Filled 2019-08-02: qty 1

## 2019-08-02 MED ORDER — BUPROPION HCL ER (XL) 300 MG PO TB24
450.0000 mg | ORAL_TABLET | Freq: Every day | ORAL | Status: DC
  Administered 2019-08-02 – 2019-08-03 (×2): 450 mg via ORAL
  Filled 2019-08-02 (×2): qty 1

## 2019-08-02 MED ORDER — BISACODYL 10 MG RE SUPP
10.0000 mg | Freq: Once | RECTAL | Status: AC
  Administered 2019-08-02: 10 mg via RECTAL

## 2019-08-02 MED ORDER — ACETAMINOPHEN 325 MG PO TABS
650.0000 mg | ORAL_TABLET | Freq: Four times a day (QID) | ORAL | Status: DC | PRN
  Administered 2019-08-02: 650 mg via ORAL
  Filled 2019-08-02: qty 2

## 2019-08-02 MED ORDER — LAMOTRIGINE 100 MG PO TABS
200.0000 mg | ORAL_TABLET | Freq: Every day | ORAL | Status: DC
  Administered 2019-08-02 – 2019-08-03 (×2): 200 mg via ORAL
  Filled 2019-08-02 (×2): qty 2

## 2019-08-02 MED ORDER — BUPROPION HCL 75 MG PO TABS: 450.0000 mg | ORAL_TABLET | Freq: Every day | ORAL | Status: DC

## 2019-08-02 NOTE — Progress Notes (Signed)
PROGRESS NOTE  [name]:[id number] DOB: August 26, 1951 DOA: 07/31/2019 PCP: Rory Percy, MD   Brief History:  68 year old male with a history of depression, stage II colon cancer status post right hemicolectomy, OSA, and GERD presenting with 1 day history of abdominal pain with associated nausea and vomiting.  He had denied any fevers, chills, chest pain, shortness breath, dysuria, hematuria, hematochezia, melena.  His last bowel movement was on the morning of 07/30/2019.  Upon presentation, CT of the abdomen and pelvis showed small bowel obstruction with a transition zone in the right lower quadrant likely related to adhesions.  There is a postsurgical change of prior colon resection with R- upper quadrant ileocolic anastomosis.  General surgery was consulted.  NG was placed to low intermittent suction.  The patient was made n.p.o. and started on IV fluids.  He gradually improved with nonoperative treatment.  His diet was gradually advanced.  Assessment/Plan: Small bowel obstruction -advance to full liquid>>>soft diet as tolerated -08/02/19--had BM -Continue IV fluids -Continue NG decompression>>clamped>>>removed 08/01/19 -08/02/19 AXR--no SBO -Continue ambulation -Optimize electrolytes  Bipolar disorder, type unspecified -Restart Wellbutrin, Lamictal, and [name] when able to tolerate po  Diabetes mellitus type 2 -Resolved after losing 50 pounds  Glaucoma -Continue Alphagan, Cosopt, Xalatan       Disposition Plan:   Home 8/14 if cleared by surgery Family Communication: No  Family at bedside  Consultants:  General surgery  Code Status:  FULL  DVT Prophylaxis:  SCDs   Procedures: As Listed in Progress Note Above  Antibiotics: None      Subjective: Patient denies fevers, chills, headache, chest pain, dyspnea, nausea, vomiting, diarrhea, abdominal pain, dysuria, hematuria, hematochezia, and melena.  He had BM today and is passing  flatus   Objective: Vitals:   08/01/19 1308 08/01/19 2119 08/02/19 0530 08/02/19 1327  BP: 128/69 (!) 144/76 (!) 103/58 (!) 106/41  Pulse: 62 (!) 56 (!) 53 (!) 47  Resp: 16 17 17 17   Temp: 98.5 F (36.9 C) 98.1 F (36.7 C) 98.3 F (36.8 C) 98 F (36.7 C)  TempSrc: Oral Oral  Oral  SpO2: 94% 100% 95% 97%  Weight:      Height:        Intake/Output Summary (Last 24 hours) at 08/02/2019 1637 Last data filed at 08/02/2019 3154 Gross per 24 hour  Intake --  Output 700 ml  Net -700 ml   Weight change:  Exam:   General:  Pt is alert, follows commands appropriately, not in acute distress  HEENT: No icterus, No thrush, No neck mass, [place]/AT  Cardiovascular: RRR, S1/S2, no rubs, no gallops  Respiratory: CTA bilaterally, no wheezing, no crackles, no rhonchi  Abdomen: Soft/+BS, non tender, non distended, no guarding  Extremities: No edema, No lymphangitis, No petechiae, No rashes, no synovitis   Data Reviewed: I have personally reviewed following labs and imaging studies Basic Metabolic Panel: Recent Labs  Lab 07/31/19 0235 07/31/19 0608 08/01/19 0618  NA 137 138 138  K 4.0 4.1 4.1  CL 102 104 105  CO2 26 26 25   GLUCOSE 114* 105* 82  BUN 20 17 11   CREATININE 0.84 0.86 0.70  CALCIUM 9.4 8.8* 8.9  MG 2.2  --  2.0  PHOS 3.5  --  3.4   Liver Function Tests: Recent Labs  Lab 07/31/19 0235 07/31/19 0608  AST 19 17  ALT 16 14  ALKPHOS 100 84  BILITOT 0.8 0.8  PROT  7.8 6.8  ALBUMIN 4.5 3.8   Recent Labs  Lab 07/31/19 0235  LIPASE 25   No results for input(s): AMMONIA in the last 168 hours. Coagulation Profile: No results for input(s): INR, PROTIME in the last 168 hours. CBC: Recent Labs  Lab 07/31/19 0235 07/31/19 0608 08/01/19 0618  WBC 9.6 7.5 6.8  NEUTROABS 7.9*  --   --   HGB 16.9 15.1 15.1  HCT 48.0 43.4 43.7  MCV 85.7 85.6 86.7  PLT 168 159 159   Cardiac Enzymes: No results for input(s): CKTOTAL, CKMB, CKMBINDEX, TROPONINI in the last 168  hours. BNP: Invalid input(s): POCBNP CBG: Recent Labs  Lab 08/01/19 0802 08/01/19 1104 08/01/19 2346 08/02/19 0532 08/02/19 1155  GLUCAP 79 78 73 68* 97   HbA1C: No results for input(s): HGBA1C in the last 72 hours. Urine analysis:    Component Value Date/Time   COLORURINE YELLOW 02/25/2017 1337   APPEARANCEUR CLEAR 02/25/2017 1337   LABSPEC 1.025 02/25/2017 1337   PHURINE 5.0 02/25/2017 1337   GLUCOSEU NEGATIVE 02/25/2017 1337   HGBUR NEGATIVE 02/25/2017 1337   BILIRUBINUR NEGATIVE 02/25/2017 1337   KETONESUR NEGATIVE 02/25/2017 1337   PROTEINUR NEGATIVE 02/25/2017 1337   UROBILINOGEN 0.2 12/14/2014 1345   NITRITE NEGATIVE 02/25/2017 1337   LEUKOCYTESUR NEGATIVE 02/25/2017 1337   Sepsis Labs: @LABRCNTIP (procalcitonin:4,lacticidven:4) ) Recent Results (from the past 240 hour(s))  SARS Coronavirus 2 [facility] order, Performed in [facility] lab) Nasopharyngeal Nasopharyngeal Swab     Status: None   Collection Time: 07/31/19  4:13 AM   Specimen: Nasopharyngeal Swab  Result Value Ref Range Status   SARS Coronavirus 2 NEGATIVE NEGATIVE Final    Comment: (NOTE) If result is NEGATIVE SARS-CoV-2 target nucleic acids are NOT DETECTED. The SARS-CoV-2 RNA is generally detectable in upper and lower  respiratory specimens during the acute phase of infection. The lowest  concentration of SARS-CoV-2 viral copies this assay can detect is 250  copies / mL. A negative result does not preclude SARS-CoV-2 infection  and should not be used as the sole basis for treatment or other  patient management decisions.  A negative result may occur with  improper specimen collection / handling, submission of specimen other  than nasopharyngeal swab, presence of viral mutation(s) within the  areas targeted by this assay, and inadequate number of viral copies  (<250 copies / mL). A negative result must be combined with clinical  observations, patient history, and epidemiological  information. If result is POSITIVE SARS-CoV-2 target nucleic acids are DETECTED. The SARS-CoV-2 RNA is generally detectable in upper and lower  respiratory specimens dur ing the acute phase of infection.  Positive  results are indicative of active infection with SARS-CoV-2.  Clinical  correlation with patient history and other diagnostic information is  necessary to determine patient infection status.  Positive results do  not rule out bacterial infection or co-infection with other viruses. If result is PRESUMPTIVE POSTIVE SARS-CoV-2 nucleic acids MAY BE PRESENT.   A presumptive positive result was obtained on the submitted specimen  and confirmed on repeat testing.  While 2019 novel coronavirus  (SARS-CoV-2) nucleic acids may be present in the submitted sample  additional confirmatory testing may be necessary for epidemiological  and / or clinical management purposes  to differentiate between  SARS-CoV-2 and other Sarbecovirus currently known to infect humans.  If clinically indicated additional testing with an alternate test  methodology [phone]) is advised. The SARS-CoV-2 RNA is generally  detectable in upper and lower  respiratory sp ecimens during the acute  phase of infection. The expected result is Negative. Fact Sheet for Patients:  StrictlyIdeas.no Fact Sheet for Healthcare Providers: BankingDealers.co.za This test is not yet approved or cleared by the [place] FDA and has been authorized for detection and/or diagnosis of SARS-CoV-2 by FDA under an Emergency Use Authorization (EUA).  This EUA will remain in effect (meaning this test can be used) for the duration of the COVID-19 declaration under Section 564(b)(1) of the Act, 21 U.S.C. section 360bbb-3(b)(1), unless the authorization is terminated or revoked sooner. Performed at [facility], [address] [place], [place] [address]      Scheduled Meds:  brimonidine   1 drop Both Eyes BID   buPROPion  450 mg Oral Daily   dorzolamide-timolol  1 drop Both Eyes BID   lamoTRIgine  200 mg Oral Daily   latanoprost  1 drop Both Eyes QHS   pantoprazole (PROTONIX) IV  40 mg Intravenous Q24H   Continuous Infusions:  0.9 % NaCl with KCl 20 mEq / L 50 mL/hr at 08/02/19 1550    Procedures/Studies: Ct Abdomen Pelvis W Contrast  Result Date: 07/31/2019 CLINICAL DATA:  68 year old male with abdominal pain, nausea vomiting. Concern for bowel obstruction. EXAM: CT ABDOMEN AND PELVIS WITH CONTRAST TECHNIQUE: Multidetector CT imaging of the abdomen and pelvis was performed using the standard protocol following bolus administration of intravenous contrast. CONTRAST:  161mL OMNIPAQUE IOHEXOL 300 MG/ML  SOLN COMPARISON:  CT of the abdomen pelvis dated 02/25/2017 FINDINGS: Lower chest: The visualized lung bases are unremarkable. No intra-abdominal free air. Small free fluid the pelvis. Hepatobiliary: No focal liver abnormality is seen. No gallstones, gallbladder wall thickening, or biliary dilatation. Pancreas: Unremarkable. No pancreatic ductal dilatation or surrounding inflammatory changes. Spleen: Normal in size without focal abnormality. Adrenals/Urinary Tract: The adrenal glands are unremarkable. Multiple left renal cysts measure up to 3 cm. Several subcentimeter hypodensities are too small to characterize. There is no hydronephrosis on either side. There is symmetric enhancement and excretion of contrast by both kidneys. The visualized ureters and urinary bladder appear unremarkable. Stomach/Bowel: The stomach is distended. There multiple dilated loops of small bowel throughout the abdomen measuring up to 3.4 cm in diameter. The distal small bowel is collapsed. A point of transition in the right lower abdomen (series 2, image [id number]) were there is abutment of adjacent loops of small bowel, likely related to adhesions. There is postsurgical changes of proximal colon resection  with right upper quadrant ileocolic anastomosis. No evidence of obstruction at the level of anastomosis. There is loose stool in the proximal colon. Appendectomy. Vascular/Lymphatic: Mild aortoiliac atherosclerotic disease. No portal venous gas. There is no adenopathy. Reproductive: The prostate and seminal vesicles are grossly unremarkable. No pelvic mass. Other: Midline anterior abdominal wall surgical scar. Musculoskeletal: Degenerative changes of the spine. No acute osseous pathology. IMPRESSION: 1. Small-bowel obstruction with transition zone in the right lower abdomen likely related to adhesions. 2. Postsurgical changes of proximal colon resection with right upper quadrant ileocolic anastomosis. No evidence of obstruction at the level of anastomosis. 3. Aortic Atherosclerosis (ICD10-I70.0). Electronically Signed   By: Anner Crete M.D.   On: 07/31/2019 03:59   Dg Chest Portable 1 View  Result Date: 07/31/2019 CLINICAL DATA:  Nasogastric tube placement EXAM: PORTABLE CHEST 1 VIEW COMPARISON:  Abdomen and pelvis CT from earlier today FINDINGS: Nasogastric tube with tip reaching the stomach. Gas and fluid is seen within the stomach. Aortic tortuosity. Normal heart size. There is no  edema, consolidation, effusion, or pneumothorax. IMPRESSION: The nasogastric tube tip and side-port reaches the distended stomach. Electronically Signed   By: Monte Fantasia M.D.   On: 07/31/2019 04:31   Dg Abd 2 Views  Result Date: 08/02/2019 CLINICAL DATA:  Acute abdominal pain with nausea/vomiting for several days. EXAM: ABDOMEN - 2 VIEW COMPARISON:  07/31/2019 FINDINGS: The bowel gas pattern is normal.  There is no evidence of free air. Surgical material within the RIGHT abdomen again noted. No acute bony abnormalities are present. IMPRESSION: No acute abnormality. Electronically Signed   By: Margarette Canada M.D.   On: 08/02/2019 09:20   Dg Abd 2 Views  Result Date: 07/31/2019 CLINICAL DATA:  68 year old male with  abdominal pain and bloating with nausea vomiting. EXAM: ABDOMEN - 2 VIEW COMPARISON:  KUB 10/05/2007.  CT Abdomen and Pelvis 02/25/2017. The FINDINGS: Upright and supine views. No pneumoperitoneum. Negative lung bases. Air-fluid level in the stomach, and multiple small-bowel air-fluid levels with small-bowel loops dilated up to 40-45 millimeters diameter. Stable right abdominal surgical clips and staple line. Paucity of large bowel gas. No acute osseous abnormality identified. IMPRESSION: 1. Positive for small bowel obstruction. No free air. 2. Previous right hemicolectomy. Electronically Signed   By: Genevie Ann M.D.   On: 07/31/2019 03:34    Orson Eva, DO  [facility] Pager [phone]  If 7PM-7AM, please contact night-coverage [email or web address] Password [facility] 08/02/2019, 4:37 PM   LOS: 2 days

## 2019-08-02 NOTE — Progress Notes (Signed)
[facility] Progress Note     Subjective: Doing great this AM. Had flatus and tolerated clears. Xray with gas in colon, no dilated small bowel and stool in vault. Gave suppository to aid with initial BM. Advanced diet up and has been doing well.   Objective: Vital signs in last 24 hours: Temp:  [98 F (36.7 C)-98.3 F (36.8 C)] 98 F (36.7 C) (08/13 1327) Pulse Rate:  [47-56] 47 (08/13 1327) Resp:  [17] 17 (08/13 1327) BP: (103-144)/(41-76) 106/41 (08/13 1327) SpO2:  [95 %-100 %] 97 % (08/13 1327) Last BM Date: 08/02/19  Intake/Output from previous day: 08/12 0701 - 08/13 0700 In: 0  Out: 1000 [Urine:900; Emesis/NG output:100] Intake/Output this shift: No intake/output data recorded.  General appearance: alert, cooperative and no distress Resp: normal work of breathing GI: soft, nondistended, minimally tender  Lab Results:  Recent Labs    07/31/19 0608 08/01/19 0618  WBC 7.5 6.8  HGB 15.1 15.1  HCT 43.4 43.7  PLT 159 159   BMET Recent Labs    07/31/19 0608 08/01/19 0618  NA 138 138  K 4.1 4.1  CL 104 105  CO2 26 25  GLUCOSE 105* 82  BUN 17 11  CREATININE 0.86 0.70  CALCIUM 8.8* 8.9    Personally reviewed- gas and stool in colon and rectum, no signs of small bowel dilation or air fluid levels  Studies/Results: Dg Abd 2 Views  Result Date: 08/02/2019 CLINICAL DATA:  Acute abdominal pain with nausea/vomiting for several days. EXAM: ABDOMEN - 2 VIEW COMPARISON:  07/31/2019 FINDINGS: The bowel gas pattern is normal.  There is no evidence of free air. Surgical material within the RIGHT abdomen again noted. No acute bony abnormalities are present. IMPRESSION: No acute abnormality. Electronically Signed   By: Margarette Canada M.D.   On: 08/02/2019 09:20    Assessment/Plan: Mr. Klemp is a 68 yo with a resolving SBO. Doing well. Had a BM - Adv diet as tolerates - Another suppository this evening - Hopefully home tomorrow - Follow up with PCP    LOS: 2 days    Virl Cagey 08/02/2019

## 2019-08-02 NOTE — Progress Notes (Signed)
Pt had a large BM, Dr. Constance Haw notified. Stated to change to full liquid diet and advance to soft, and another one time dose of suppository this afternoon. Will continue to monitor.

## 2019-08-02 NOTE — Progress Notes (Signed)
Suppository given per Dr. Constance Haw orders. Pt is still ambulating in hall frequently. Still passing gas but no BM yet. Tolerating clear liquid diet well. Will continue to monitor.

## 2019-08-03 LAB — GLUCOSE, CAPILLARY
Glucose-Capillary: 75 mg/dL (ref 70–99)
Glucose-Capillary: 113 mg/dL — ABNORMAL HIGH (ref 70–99)

## 2019-08-03 MED ORDER — DOCUSATE SODIUM 100 MG PO CAPS: 100.0000 mg | ORAL_CAPSULE | Freq: Two times a day (BID) | ORAL | 2 refills | Status: DC | PRN

## 2019-08-03 NOTE — Discharge Instructions (Signed)
Bowel Obstruction °A bowel obstruction means that something is blocking the small or large bowel. The bowel is also called the intestine. It is the long tube that connects the stomach to the opening of the butt (anus). When something blocks the bowel, food and fluids cannot pass through like normal. This condition needs to be treated. Treatment depends on the cause of the problem and how bad the problem is. °What are the causes? °Common causes of this condition include: °· Scar tissue (adhesions) from past surgery or from high-energy X-rays (radiation). °· Recent surgery in the belly. This affects how food moves in the bowel. °· Some diseases, such as: °? Irritation of the lining of the digestive tract (Crohn's disease). °? Irritation of small pouches in the bowel (diverticulitis). °· Growths or tumors. °· A bulging organ (hernia). °· Twisting of the bowel (volvulus). °· A foreign body. °· Slipping of a part of the bowel into another part (intussusception). °What are the signs or symptoms? °Symptoms of this condition include: °· Pain in the belly. °· Feeling sick to your stomach (nauseous). °· Throwing up (vomiting). °· Bloating in the belly. °· Being unable to pass gas. °· Trouble pooping (constipation). °· Watery poop (diarrhea). °· A lot of belching. °How is this diagnosed? °This condition may be diagnosed based on: °· A physical exam. °· Medical history. °· Imaging tests, such as X-ray or CT scan. °· Blood tests. °· Urine tests. °How is this treated? °Treatment for this condition may include: °· Fluids and pain medicines that are given through an IV tube. Your doctor may tell you not to eat or drink if you feel sick to your stomach and are throwing up. °· Eating a clear liquid diet for a few days. °· Putting a small tube (nasogastric tube) into the stomach. This will help with pain, discomfort, and nausea by removing blocked air and fluids from the stomach. °· Surgery. This may be needed if other treatments do  not work. °Follow these instructions at home: °Medicines °· Take over-the-counter and prescription medicines only as told by your doctor. °· If you were prescribed an antibiotic medicine, take it as told by your doctor. Do not stop taking the antibiotic even if you start to feel better. °General instructions °· Follow your diet as told by your doctor. You may need to: °? Only drink clear liquids until you start to get better. °? Avoid solid foods. °· Return to your normal activities as told by your doctor. Ask your doctor what activities are safe for you. °· Do not sit for a long time without moving. Get up to take short walks every 1-2 hours. This is important. Ask for help if you feel weak or unsteady. °· Keep all follow-up visits as told by your doctor. This is important. °How is this prevented? °After having a bowel obstruction, you may be more likely to have another. You can do some things to stop it from happening again. °· If you have a long-term (chronic) disease, contact your doctor if you see changes or problems. °· Take steps to prevent or treat trouble pooping. Your doctor may ask that you: °? Drink enough fluid to keep your pee (urine) pale yellow. °? Take over-the-counter or prescription medicines. °? Eat foods that are high in fiber. These include beans, whole grains, and fresh fruits and vegetables. °? Limit foods that are high in fat and sugar. These include fried or sweet foods. °· Stay active. Ask your doctor which exercises are   safe for you. °· Avoid stress. °· Eat three small meals and three small snacks each day. °· Work with a food expert (dietitian) to make a meal plan that works for you. °· Do not use any products that contain nicotine or tobacco, such as cigarettes and e-cigarettes. If you need help quitting, ask your doctor. °Contact a doctor if: °· You have a fever. °· You have chills. °Get help right away if: °· You have pain or cramps that get worse. °· You throw up blood. °· You are  sick to your stomach. °· You cannot stop throwing up. °· You cannot drink fluids. °· You feel mixed up (confused). °· You feel very thirsty (dehydrated). °· Your belly gets more bloated. °· You feel weak or you pass out (faint). °Summary °· A bowel obstruction means that something is blocking the small or large bowel. °· Treatment may include IV fluids and pain medicine. You may also have a clear liquid diet, a small tube in your stomach, or surgery. °· Drink clear liquids and avoid solid foods until you get better. °This information is not intended to replace advice given to you by your health care provider. Make sure you discuss any questions you have with your health care provider. °Document Released: 01/13/2005 Document Revised: 04/19/2018 Document Reviewed: 04/19/2018 °Elsevier Patient Education © 2020 [employer] ° °

## 2019-08-03 NOTE — Discharge Summary (Signed)
Physician Discharge Summary  [name] XKG:[id number] DOB: 06-01-1951 DOA: 07/31/2019  PCP: Rory Percy, MD  Admit date: 07/31/2019 Discharge date: 08/03/2019  Admitted From: Home Disposition:  Home   Recommendations for Outpatient Follow-up:  1. Follow up with PCP in 1-2 weeks 2. 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 He had denied any fevers, chills, chest pain, shortness breath, dysuria, hematuria, hematochezia, melena. His last bowel movement was on the morning of 07/30/2019. Upon presentation, CT of the abdomen and pelvis showed small bowel obstruction with a transition zone in the right lower quadrant likely related to adhesions. There is a postsurgical change of prior colon resection with R-upper quadrant ileocolic anastomosis.General surgery was consulted. NG was placed to low intermittent suction. The patient was made n.p.o. and started on IV fluids.  He gradually improved with nonoperative treatment.  He subsequently had a number of BMs with symptomatic improvement.  His diet was gradually advanced which he tolerated.  Discharge Diagnoses:  Small bowel obstruction -advance to full liquid>>>soft diet as tolerated -08/02/19--had BMs -tolerated soft diet -Continue IV fluids -Continue NG decompression>>clamped>>>removed 08/01/19 -08/02/19 AXR--no SBO -Continue ambulation -Optimize electrolytes  Bipolar disorder, type unspecified -Restarted Wellbutrin, Lamictal, andVraylar when able to tolerate po  Diabetes mellitus type 2 -Resolved after losing 50 pounds  Glaucoma -Continue Alphagan, Cosopt, Xalatan   Discharge Instructions  Discharge Instructions     Call MD for:  difficulty breathing, headache or visual disturbances   Complete by: As directed    Call MD for:  extreme fatigue   Complete by: As directed    Call MD for:  persistant dizziness or light-headedness   Complete by: As directed    Call MD for:  persistant nausea and vomiting   Complete by: As directed    Call MD for:  redness, tenderness, or signs of infection (pain, swelling, redness, odor or green/yellow discharge around incision site)   Complete by: As directed    Call MD for:  severe uncontrolled pain   Complete by: As directed    Call MD for:  temperature >100.4   Complete by: As directed    Increase activity slowly   Complete by: As directed      Allergies as of 08/03/2019      Reactions   Ambien [zolpidem Tartrate]    Sleep walks   Bactrim [sulfamethoxazole-trimethoprim] Hives, Swelling   Dilaudid [hydromorphone Hcl] Other (See Comments)   Made blood pressure drop with colon resection surgery in 2007    Morphine And Related Nausea And Vomiting      Medication List    TAKE these medications   brimonidine 0.2 % ophthalmic solution Commonly known as: ALPHAGAN Place 1 drop into both eyes 2 (two) times daily.   buPROPion 150 MG 24 hr tablet Commonly known as: WELLBUTRIN XL Take 3 tablets (450 mg total) by mouth every morning.   Cosopt 22.3-6.8 MG/ML ophthalmic solution Generic drug: dorzolamide-timolol Place 1 drop into both eyes 2 (two) times daily.   docusate sodium 100 MG capsule Commonly known as: Colace Take 1 capsule (100 mg total) by mouth 2 (two) times daily as needed.   fluticasone 50 MCG/ACT nasal spray Commonly known as: FLONASE Place 2 sprays  into both nostrils daily as needed.   lamoTRIgine 200 MG tablet Commonly known as: LAMICTAL Take 1 tablet (200 mg total) by mouth daily.   latanoprost 0.005 % ophthalmic solution Commonly known as: XALATAN Place 1 drop into both eyes at bedtime.   pantoprazole 40 MG tablet Commonly known  as: PROTONIX Take 1 tablet (40 mg total) by mouth 2 (two) times daily before a meal.   traZODone 50 MG tablet Commonly known as: DESYREL Take 1-2 tablets (50-100 mg total) by mouth at bedtime as needed for sleep (insomnia).   vitamin B-12 500 MCG tablet Commonly known as: CYANOCOBALAMIN Take 500 mcg every evening by mouth.   [name] capsule Generic drug: cariprazine TAKE 1 CAPSULE(1.5 MG) BY MOUTH DAILY What changed: See the new instructions.      Follow-up Information    Rory Percy, MD Follow up in 1 week(s).   Specialty: Family Medicine Contact information: [address] [place] [address] [phone]        Daneil Dolin, MD Follow up.   Specialty: Gastroenterology Why: Follow up colonoscopy  Contact information: [address] [place] [address] [phone]        Virl Cagey, MD Follow up.   Specialty: General Surgery Why: As Needed  Contact information: [address] Linna Hoff [facility] [address] [phone]          Allergies  Allergen Reactions   Ambien [Zolpidem Tartrate]     Sleep walks   Bactrim [Sulfamethoxazole-Trimethoprim] Hives and Swelling   Dilaudid [Hydromorphone Hcl] Other (See Comments)    Made blood pressure drop with colon resection surgery in 2007    Morphine And Related Nausea And Vomiting    Consultations:  General surgery   Procedures/Studies: Ct Abdomen Pelvis W Contrast  Result Date: 07/31/2019 CLINICAL DATA:  68 year old male with abdominal pain, nausea vomiting. Concern for bowel obstruction. EXAM: CT ABDOMEN AND PELVIS WITH CONTRAST TECHNIQUE: Multidetector CT imaging of the abdomen and pelvis was performed using the standard protocol following bolus administration of intravenous contrast. CONTRAST:  175mL OMNIPAQUE IOHEXOL 300 MG/ML  SOLN COMPARISON:  CT of the abdomen pelvis dated 02/25/2017 FINDINGS: Lower chest: The visualized lung bases are unremarkable. No intra-abdominal free air. Small free  fluid the pelvis. Hepatobiliary: No focal liver abnormality is seen. No gallstones, gallbladder wall thickening, or biliary dilatation. Pancreas: Unremarkable. No pancreatic ductal dilatation or surrounding inflammatory changes. Spleen: Normal in size without focal abnormality. Adrenals/Urinary Tract: The adrenal glands are unremarkable. Multiple left renal cysts measure up to 3 cm. Several subcentimeter hypodensities are too small to characterize. There is no hydronephrosis on either side. There is symmetric enhancement and excretion of contrast by both kidneys. The visualized ureters and urinary bladder appear unremarkable. Stomach/Bowel: The stomach is distended. There multiple dilated loops of small bowel throughout the abdomen measuring up to 3.4 cm in diameter. The distal small bowel is collapsed. A point of transition in the right lower abdomen (series 2, image [id number]) were there is abutment of adjacent loops of small bowel, likely related to adhesions. There is postsurgical changes of proximal colon resection with right upper quadrant ileocolic anastomosis. No evidence of obstruction at the level of anastomosis. There is loose stool in the proximal colon. Appendectomy. Vascular/Lymphatic: Mild aortoiliac atherosclerotic disease. No portal venous gas. There is no adenopathy. Reproductive: The prostate and seminal vesicles are grossly unremarkable. No pelvic mass. Other: Midline anterior abdominal wall surgical scar. Musculoskeletal: Degenerative changes of the spine. No acute osseous pathology. IMPRESSION: 1.  Small-bowel obstruction with transition zone in the right lower abdomen likely related to adhesions. 2. Postsurgical changes of proximal colon resection with right upper quadrant ileocolic anastomosis. No evidence of obstruction at the level of anastomosis. 3. Aortic Atherosclerosis (ICD10-I70.0). Electronically Signed   By: Anner Crete M.D.   On: 07/31/2019 03:59   Dg Chest Portable 1  View  Result Date: 07/31/2019 CLINICAL DATA:  Nasogastric tube placement EXAM: PORTABLE CHEST 1 VIEW COMPARISON:  Abdomen and pelvis CT from earlier today FINDINGS: Nasogastric tube with tip reaching the stomach. Gas and fluid is seen within the stomach. Aortic tortuosity. Normal heart size. There is no edema, consolidation, effusion, or pneumothorax. IMPRESSION: The nasogastric tube tip and side-port reaches the distended stomach. Electronically Signed   By: Monte Fantasia M.D.   On: 07/31/2019 04:31   Dg Abd 2 Views  Result Date: 08/02/2019 CLINICAL DATA:  Acute abdominal pain with nausea/vomiting for several days. EXAM: ABDOMEN - 2 VIEW COMPARISON:  07/31/2019 FINDINGS: The bowel gas pattern is normal.  There is no evidence of free air. Surgical material within the RIGHT abdomen again noted. No acute bony abnormalities are present. IMPRESSION: No acute abnormality. Electronically Signed   By: Margarette Canada M.D.   On: 08/02/2019 09:20   Dg Abd 2 Views  Result Date: 07/31/2019 CLINICAL DATA:  68 year old male with abdominal pain and bloating with nausea vomiting. EXAM: ABDOMEN - 2 VIEW COMPARISON:  KUB 10/05/2007.  CT Abdomen and Pelvis 02/25/2017. The FINDINGS: Upright and supine views. No pneumoperitoneum. Negative lung bases. Air-fluid level in the stomach, and multiple small-bowel air-fluid levels with small-bowel loops dilated up to 40-45 millimeters diameter. Stable right abdominal surgical clips and staple line. Paucity of large bowel gas. No acute osseous abnormality identified. IMPRESSION: 1. Positive for small bowel obstruction. No free air. 2. Previous right hemicolectomy. Electronically Signed   By: Genevie Ann M.D.   On: 07/31/2019 03:34         Discharge Exam: Vitals:   08/02/19 2117 08/03/19 0559  BP: 128/71 (!) 121/54  Pulse: (!) 55 (!) 47  Resp: 18 18  Temp: 98.3 F (36.8 C) 98.2 F (36.8 C)  SpO2: 97% 99%   Vitals:   08/02/19 0530 08/02/19 1327 08/02/19 2117 08/03/19 0559   BP: (!) 103/58 (!) 106/41 128/71 (!) 121/54  Pulse: (!) 53 (!) 47 (!) 55 (!) 47  Resp: 17 17 18 18   Temp: 98.3 F (36.8 C) 98 F (36.7 C) 98.3 F (36.8 C) 98.2 F (36.8 C)  TempSrc:  Oral    SpO2: 95% 97% 97% 99%  Weight:      Height:        General: Pt is alert, awake, not in acute distress Cardiovascular: RRR, S1/S2 +, no rubs, no gallops Respiratory: CTA bilaterally, no wheezing, no rhonchi Abdominal: Soft, NT, ND, bowel sounds + Extremities: no edema, no cyanosis   The results of significant diagnostics from this hospitalization (including imaging, microbiology, ancillary and laboratory) are listed below for reference.    Significant Diagnostic Studies: Ct Abdomen Pelvis W Contrast  Result Date: 07/31/2019 CLINICAL DATA:  68 year old male with abdominal pain, nausea vomiting. Concern for bowel obstruction. EXAM: CT ABDOMEN AND PELVIS WITH CONTRAST TECHNIQUE: Multidetector CT imaging of the abdomen and pelvis was performed using the standard protocol following bolus administration of intravenous contrast. CONTRAST:  160mL OMNIPAQUE IOHEXOL 300 MG/ML  SOLN COMPARISON:  CT of the abdomen pelvis dated 02/25/2017 FINDINGS: Lower chest: The visualized lung bases are  unremarkable. No intra-abdominal free air. Small free fluid the pelvis. Hepatobiliary: No focal liver abnormality is seen. No gallstones, gallbladder wall thickening, or biliary dilatation. Pancreas: Unremarkable. No pancreatic ductal dilatation or surrounding inflammatory changes. Spleen: Normal in size without focal abnormality. Adrenals/Urinary Tract: The adrenal glands are unremarkable. Multiple left renal cysts measure up to 3 cm. Several subcentimeter hypodensities are too small to characterize. There is no hydronephrosis on either side. There is symmetric enhancement and excretion of contrast by both kidneys. The visualized ureters and urinary bladder appear unremarkable. Stomach/Bowel: The stomach is distended. There  multiple dilated loops of small bowel throughout the abdomen measuring up to 3.4 cm in diameter. The distal small bowel is collapsed. A point of transition in the right lower abdomen (series 2, image 51-57) were there is abutment of adjacent loops of small bowel, likely related to adhesions. There is postsurgical changes of proximal colon resection with right upper quadrant ileocolic anastomosis. No evidence of obstruction at the level of anastomosis. There is loose stool in the proximal colon. Appendectomy. Vascular/Lymphatic: Mild aortoiliac atherosclerotic disease. No portal venous gas. There is no adenopathy. Reproductive: The prostate and seminal vesicles are grossly unremarkable. No pelvic mass. Other: Midline anterior abdominal wall surgical scar. Musculoskeletal: Degenerative changes of the spine. No acute osseous pathology. IMPRESSION: 1. Small-bowel obstruction with transition zone in the right lower abdomen likely related to adhesions. 2. Postsurgical changes of proximal colon resection with right upper quadrant ileocolic anastomosis. No evidence of obstruction at the level of anastomosis. 3. Aortic Atherosclerosis (ICD10-I70.0). Electronically Signed   By: Anner Crete M.D.   On: 07/31/2019 03:59   Dg Chest Portable 1 View  Result Date: 07/31/2019 CLINICAL DATA:  Nasogastric tube placement EXAM: PORTABLE CHEST 1 VIEW COMPARISON:  Abdomen and pelvis CT from earlier today FINDINGS: Nasogastric tube with tip reaching the stomach. Gas and fluid is seen within the stomach. Aortic tortuosity. Normal heart size. There is no edema, consolidation, effusion, or pneumothorax. IMPRESSION: The nasogastric tube tip and side-port reaches the distended stomach. Electronically Signed   By: Monte Fantasia M.D.   On: 07/31/2019 04:31   Dg Abd 2 Views  Result Date: 08/02/2019 CLINICAL DATA:  Acute abdominal pain with nausea/vomiting for several days. EXAM: ABDOMEN - 2 VIEW COMPARISON:  07/31/2019 FINDINGS: The  bowel gas pattern is normal.  There is no evidence of free air. Surgical material within the RIGHT abdomen again noted. No acute bony abnormalities are present. IMPRESSION: No acute abnormality. Electronically Signed   By: Margarette Canada M.D.   On: 08/02/2019 09:20   Dg Abd 2 Views  Result Date: 07/31/2019 CLINICAL DATA:  68 year old male with abdominal pain and bloating with nausea vomiting. EXAM: ABDOMEN - 2 VIEW COMPARISON:  KUB 10/05/2007.  CT Abdomen and Pelvis 02/25/2017. The FINDINGS: Upright and supine views. No pneumoperitoneum. Negative lung bases. Air-fluid level in the stomach, and multiple small-bowel air-fluid levels with small-bowel loops dilated up to 40-45 millimeters diameter. Stable right abdominal surgical clips and staple line. Paucity of large bowel gas. No acute osseous abnormality identified. IMPRESSION: 1. Positive for small bowel obstruction. No free air. 2. Previous right hemicolectomy. Electronically Signed   By: Genevie Ann M.D.   On: 07/31/2019 03:34     Microbiology: Recent Results (from the past 240 hour(s))  SARS Coronavirus 2 [facility] order, Performed in [facility] hospital lab) Nasopharyngeal Nasopharyngeal Swab     Status: None   Collection Time: 07/31/19  4:13 AM   Specimen: Nasopharyngeal Swab  Result Value Ref Range Status   SARS Coronavirus 2 NEGATIVE NEGATIVE Final    Comment: (NOTE) If result is NEGATIVE SARS-CoV-2 target nucleic acids are NOT DETECTED. The SARS-CoV-2 RNA is generally detectable in upper and lower  respiratory specimens during the acute phase of infection. The lowest  concentration of SARS-CoV-2 viral copies this assay can detect is 250  copies / mL. A negative result does not preclude SARS-CoV-2 infection  and should not be used as the sole basis for treatment or other  patient management decisions.  A negative result may occur with  improper specimen collection / handling, submission of specimen other  than nasopharyngeal swab, presence  of viral mutation(s) within the  areas targeted by this assay, and inadequate number of viral copies  (<250 copies / mL). A negative result must be combined with clinical  observations, patient history, and epidemiological information. If result is POSITIVE SARS-CoV-2 target nucleic acids are DETECTED. The SARS-CoV-2 RNA is generally detectable in upper and lower  respiratory specimens dur ing the acute phase of infection.  Positive  results are indicative of active infection with SARS-CoV-2.  Clinical  correlation with patient history and other diagnostic information is  necessary to determine patient infection status.  Positive results do  not rule out bacterial infection or co-infection with other viruses. If result is PRESUMPTIVE POSTIVE SARS-CoV-2 nucleic acids MAY BE PRESENT.   A presumptive positive result was obtained on the submitted specimen  and confirmed on repeat testing.  While 2019 novel coronavirus  (SARS-CoV-2) nucleic acids may be present in the submitted sample  additional confirmatory testing may be necessary for epidemiological  and / or clinical management purposes  to differentiate between  SARS-CoV-2 and other Sarbecovirus currently known to infect humans.  If clinically indicated additional testing with an alternate test  methodology [id number]) is advised. The SARS-CoV-2 RNA is generally  detectable in upper and lower respiratory sp ecimens during the acute  phase of infection. The expected result is Negative. Fact Sheet for Patients:  StrictlyIdeas.no Fact Sheet for Healthcare Providers: BankingDealers.co.za This test is not yet approved or cleared by the [place] FDA and has been authorized for detection and/or diagnosis of SARS-CoV-2 by FDA under an Emergency Use Authorization (EUA).  This EUA will remain in effect (meaning this test can be used) for the duration of the COVID-19 declaration under Section  564(b)(1) of the Act, 21 U.S.C. section 360bbb-3(b)(1), unless the authorization is terminated or revoked sooner. Performed at [facility], [address], [place], [place] [address]      Labs: Basic Metabolic Panel: Recent Labs  Lab 07/31/19 0235 07/31/19 0608 08/01/19 0618  NA 137 138 138  K 4.0 4.1 4.1  CL 102 104 105  CO2 26 26 25   GLUCOSE 114* 105* 82  BUN 20 17 11   CREATININE 0.84 0.86 0.70  CALCIUM 9.4 8.8* 8.9  MG 2.2  --  2.0  PHOS 3.5  --  3.4   Liver Function Tests: Recent Labs  Lab 07/31/19 0235 07/31/19 0608  AST 19 17  ALT 16 14  ALKPHOS 100 84  BILITOT 0.8 0.8  PROT 7.8 6.8  ALBUMIN 4.5 3.8   Recent Labs  Lab 07/31/19 0235  LIPASE 25   No results for input(s): AMMONIA in the last 168 hours. CBC: Recent Labs  Lab 07/31/19 0235 07/31/19 0608 08/01/19 0618  WBC 9.6 7.5 6.8  NEUTROABS 7.9*  --   --   HGB 16.9 15.1 15.1  HCT 48.0 43.4 43.7  MCV 85.7 85.6 86.7  PLT 168 159 159   Cardiac Enzymes: No results for input(s): CKTOTAL, CKMB, CKMBINDEX, TROPONINI in the last 168 hours. BNP: Invalid input(s): POCBNP CBG: Recent Labs  Lab 08/02/19 1155 08/02/19 1757 08/02/19 2358 08/03/19 0557 08/03/19 1100  GLUCAP 97 107* 87 75 113*    Time coordinating discharge:  36 minutes  Signed:  Orson Eva, DO [facility] Pager: [phone] 08/03/2019, 4:56 PM

## 2019-08-03 NOTE — Progress Notes (Signed)
Pt IV removed, WNL. D/C instructions given to pt. Verbalized understanding. Pt spouse to pick up at main entrance.

## 2019-08-06 ENCOUNTER — Other Ambulatory Visit: Payer: Self-pay

## 2019-08-06 ENCOUNTER — Telehealth: Payer: Self-pay | Admitting: Gastroenterology

## 2019-08-06 ENCOUNTER — Ambulatory Visit: Payer: Medicare Other | Admitting: Gastroenterology

## 2019-08-06 ENCOUNTER — Encounter: Payer: Self-pay | Admitting: Gastroenterology

## 2019-08-06 ENCOUNTER — Encounter: Payer: Self-pay | Admitting: Internal Medicine

## 2019-08-06 VITALS — BP 109/63 | HR 57 | Temp 97.0°F | Ht 69.0 in | Wt 185.4 lb

## 2019-08-06 DIAGNOSIS — K5900 Constipation, unspecified: Secondary | ICD-10-CM | POA: Diagnosis not present

## 2019-08-06 DIAGNOSIS — K56609 Unspecified intestinal obstruction, unspecified as to partial versus complete obstruction: Secondary | ICD-10-CM | POA: Diagnosis not present

## 2019-08-06 MED ORDER — PEG 3350-KCL-NA BICARB-NACL 420 G PO SOLR: 4000.0000 mL | ORAL | 0 refills | Status: DC

## 2019-08-06 NOTE — Assessment & Plan Note (Signed)
Pleasant 68 year old gentleman with history of stage II colon cancer with resection in 2007, presenting for follow-up of recent admission for small bowel obstruction.  Patient responded to the conservative treatments.  Had large BM while inpatient, picture provided today.  The past 4 days he has had little results as far as bowel movements.  He took an enema over the weekend, past enema solution and couple of Bristol 1 balls.  Took a suppository yesterday with no results.  Slight bloating but overall feels good.  No abdominal pain or nausea.  Tolerating diet.  We will add MiraLAX 1 capful 2-3 times daily for the next couple of days.  Once has adequate bowel movement, came back down to once daily dosing as needed.  He will let me know if no significant improvement.  ED precautions advised.  Question was brought up about by general surgery while inpatient regarding possible need for updating colonoscopy given history of anastomotic ulcer.  Last colonoscopy nearly 3 years ago.  Will discuss with Dr. Gala Romney.

## 2019-08-06 NOTE — Addendum Note (Signed)
Addended by: Hassan Rowan on: 08/06/2019 04:09 PM   Modules accepted: Orders

## 2019-08-06 NOTE — Telephone Encounter (Signed)
Pt is notified of RMR's recommendations. [facility] Clinical- Pt is ok with arranging ileocolonoscopy for 1-2 months, please arrange.

## 2019-08-06 NOTE — Patient Instructions (Signed)
1. Take Miralax one capful or packet with 4-6 ounces of water up to three times per day for the next two days to get your bowels moving. Then you can back down to once daily as needed. 2. If no bowel movement by tomorrow, please call and let me know.  3. I will discuss possible colonoscopy with Dr. Gala Romney and let you know if he recommends one in near future.

## 2019-08-06 NOTE — Telephone Encounter (Signed)
Called pt, he is ok with having ileocolonoscopy w/Propofol w/RMR 10/11/19.   Magda Paganini, please advise.

## 2019-08-06 NOTE — Telephone Encounter (Signed)
Please let patient know that I discussed possible colonoscopy with Dr. Gala Romney. He said we could consider updating colonoscopy sometime between now and end of year if patient desires. Would wait to make sure his bowels are working well prior to submitting him to a bowel prep for colonoscopy. If he is interested in getting on the schedule in the 1-2 months we can arrange (Ileocolonoscopy with RMR with propofol). Otherwise let's have him return for OV with RMR in two months.   Let me know what he decides.

## 2019-08-06 NOTE — Progress Notes (Signed)
Primary Care Physician:  Rory Percy, MD  Primary Gastroenterologist:  Garfield Cornea, MD   Chief Complaint  Patient presents with  . SBO    released friday  . Constipation    no BM in 4 days    HPI:  [name] is a 68 y.o. male here for same-day office visit for constipation.  Last seen October 2018.  History of stage II colon cancer with resection in 2007.  Last colonoscopy November 2017 with ulcerated mucosa at the anastomosis.  Biopsy benign.  Next colonoscopy planned for November 2023.  He also has a history of abnormal LFTs, fatty liver.EGD November 2016 with 2-3 cm; so, hard mass within 8-9 mm area of central ulceration in the proximal esophagus at 20-23 cm from the incisors.Salmon-colored epithelium also noted suspicious for Barrett's. No esophagitis. Gastric biopsy with chronic active gastritis with H pylori, subsequently treated, esophagogastric junction biopsy gastric-type mucosa with mixed inflammation and lymphoid follicles. Esophageal mass biopsy benign, it was felt that biopsy may not be sufficient due to the approach. He subsequently underwent an EUS with Dr. Owens Loffler on 11/20/2015. At this time the EGD findings were entirely normal. EUS findings of the esophagus normal. He had a CT of the chest which was normal. Questionable mass related to GERD, edema resolved after starting PPI therapy?.  EGD November 2018 for nausea was entirely normal.  Patient with recent admission for abdominal pain, bloating, vomiting.  Abdominal x-ray August 11 with evidence of small bowel obstruction.  CT abdomen pelvis same day, multiple dilated loops of small bowel throughout the abdomen measuring up to 3.4 cm.  Distal small bowel collapsed.  Transition point in the right lower abdomen where there is an abutment of adjacent loops of small bowel, likely related to adhesions.  Postsurgical changes of proximal colon resection with right upper quadrant ileocolic anastomosis.  No evidence of  obstruction at the level of the anastomosis.  Loose stool in the proximal colon.  Abdominal x-ray 2 days later was unremarkable.  Patient had a large solid stool on Thursday while still inpatient. Since then he has not had any significant stooling. Pass a few hard balls with enema over the weekend. Tried a suppository but no results. No melena, brbpr. No N/V. Tolerating diet. No abdominal pain. Mild bloating.    Prior to small bowel obstruction, BM every day to every other day, more difficult to go, some regular and watery but not complete. No heartburn, dysphagia.   He has had intentional weight loss of 50 pounds since 02/2019.   Current Outpatient Medications  Medication Sig Dispense Refill  . brimonidine (ALPHAGAN) 0.2 % ophthalmic solution Place 1 drop into both eyes 2 (two) times daily.     Marland Kitchen buPROPion (WELLBUTRIN XL) 150 MG 24 hr tablet Take 3 tablets (450 mg total) by mouth every morning. 270 tablet 3  . docusate sodium (COLACE) 100 MG capsule Take 1 capsule (100 mg total) by mouth 2 (two) times daily as needed. 60 capsule 2  . dorzolamide-timolol (COSOPT) 22.3-6.8 MG/ML ophthalmic solution Place 1 drop into both eyes 2 (two) times daily.    . fluticasone (FLONASE) 50 MCG/ACT nasal spray Place 2 sprays into both nostrils daily as needed.   2  . lamoTRIgine (LAMICTAL) 200 MG tablet Take 1 tablet (200 mg total) by mouth daily. 90 tablet 3  . latanoprost (XALATAN) 0.005 % ophthalmic solution Place 1 drop into both eyes at bedtime.    . pantoprazole (PROTONIX) 40 MG tablet  Take 1 tablet (40 mg total) by mouth 2 (two) times daily before a meal. 60 tablet 5  . traZODone (DESYREL) 50 MG tablet Take 1-2 tablets (50-100 mg total) by mouth at bedtime as needed for sleep (insomnia). 180 tablet 3  . vitamin B-12 (CYANOCOBALAMIN) 500 MCG tablet Take 500 mcg every evening by mouth.    Arman Filter capsule TAKE 1 CAPSULE(1.5 MG) BY MOUTH DAILY (Patient taking differently: Take 1.5 mg by mouth daily. ) 30  capsule 1   No current facility-administered medications for this visit.     Allergies as of 08/06/2019 - Review Complete 08/06/2019  Allergen Reaction Noted  . Ambien [zolpidem tartrate]  09/12/2018  . Bactrim [sulfamethoxazole-trimethoprim] Hives and Swelling 09/08/2016  . Dilaudid [hydromorphone hcl] Other (See Comments) 09/08/2016  . Morphine and related Nausea And Vomiting 09/08/2016    Past Medical History:  Diagnosis Date  . Anemia    hx of   . Arthritis    fingers   . BPH (benign prostatic hyperplasia)   . Colon cancer ([place])    stage II, diagnosed 2007  . Complication of anesthesia    vagal response after umbilcal hernia at [facility]  . Depression   . Diabetes mellitus    no as of 09/08/16 due to weight loss   . GERD (gastroesophageal reflux disease)   . Glaucoma 2012  . History of kidney stones   . Sleep apnea    wears CiPaP at night  . Staph infection 2008   left side of face    Past Surgical History:  Procedure Laterality Date  . APPENDECTOMY    . CARDIAC CATHETERIZATION    . COLECTOMY  2007   right hemicolectomy  . COLONOSCOPY  05/11/10   normal rectum,normal residual colon with some angry appearring anastomtic mucosa with some erosions and oozing, bx unremarkable  . COLONOSCOPY  05/16/2012   Dr. Gala Romney- normal rectum, sigmoid diverticulosis, anastomotic ulcers. bx= acute ulcer and benign colonic mucosa with intramucosal lympoid aggregates  . COLONOSCOPY N/A 11/17/2016   Dr. Gala Romney: Ulcerated mucosa were present at the anastomosis. benign bx. next TCS in 10/2021.  . ESOPHAGEAL DILATION N/A 10/28/2015   Procedure: ESOPHAGEAL DILATION;  Surgeon: Daneil Dolin, MD;  Location: [facility];  Service: Endoscopy;  Laterality: N/A;  . ESOPHAGOGASTRODUODENOSCOPY N/A 10/28/2015   RMR: ulcerated proximal esophageal mass ad described status post biopsy. Abnormal distal esophagus biopsy. status post biopsy. Hiatal hernia . abnormal gastric mucosa doubt clinical  significance status post biopsy  . ESOPHAGOGASTRODUODENOSCOPY (EGD) WITH PROPOFOL N/A 11/07/2017   Procedure: ESOPHAGOGASTRODUODENOSCOPY (EGD) WITH PROPOFOL;  Surgeon: Daneil Dolin, MD;  Location: [facility];  Service: Endoscopy;  Laterality: N/A;  8:15am  . EUS N/A 11/20/2015   Procedure: UPPER ENDOSCOPIC ULTRASOUND (EUS) LINEAR;  Surgeon: Milus Banister, MD;  Location: [facility];  Service: Endoscopy;  Laterality: N/A;  . HEMORRHOID SURGERY  12/31/2011   Procedure: HEMORRHOIDECTOMY;  Surgeon: Jamesetta So;  Location: [facility];  Service: General;  Laterality: N/A;  . HERNIA REPAIR     umbilical  . KIDNEY STONE SURGERY    . PILONIDAL CYST / SINUS EXCISION  1976  . SKIN LESION EXCISION     left shoulder/pre cancerous  . TRANSURETHRAL RESECTION OF PROSTATE N/A 09/09/2016   Procedure: TRANSURETHRAL RESECTION OF THE PROSTATE (TURP);  Surgeon: Irine Seal, MD;  Location: [facility];  Service: Urology;  Laterality: N/A;    Family History  Problem Relation Age of Onset  .  Lung cancer Father   . Diabetes Mother   . Anesthesia problems Neg Hx   . Hypotension Neg Hx   . Malignant hyperthermia Neg Hx   . Pseudochol deficiency Neg Hx     Social History   Socioeconomic History  . Marital status: Married    Spouse name: Not on file  . Number of children: 1  . Years of education: Not on file  . Highest education level: Not on file  Occupational History  . Occupation: school system    Employer: [employer]  . Financial resource strain: Not on file  . Food insecurity    Worry: Not on file    Inability: Not on file  . Transportation needs    Medical: Not on file    Non-medical: Not on file  Tobacco Use  . Smoking status: Never Smoker  . Smokeless tobacco: Never Used  . Tobacco comment: Never smoked  Substance and Sexual Activity  . Alcohol use: No    Alcohol/week: 0.0 standard drinks  . Drug use: No  . Sexual activity: Yes    Birth control/protection: None   Lifestyle  . Physical activity    Days per week: Not on file    Minutes per session: Not on file  . Stress: Not on file  Relationships  . Social Herbalist on phone: Not on file    Gets together: Not on file    Attends religious service: Not on file    Active member of club or organization: Not on file    Attends meetings of clubs or organizations: Not on file    Relationship status: Not on file  . Intimate partner violence    Fear of current or ex partner: Not on file    Emotionally abused: Not on file    Physically abused: Not on file    Forced sexual activity: Not on file  Other Topics Concern  . Not on file  Social History Narrative  . Not on file      ROS:  General: Negative for anorexia, weight loss, fever, chills, fatigue, weakness. Eyes: Negative for vision changes.  ENT: Negative for hoarseness, difficulty swallowing , nasal congestion. CV: Negative for chest pain, angina, palpitations, dyspnea on exertion, peripheral edema.  Respiratory: Negative for dyspnea at rest, dyspnea on exertion, cough, sputum, wheezing.  GI: See history of present illness. GU:  Negative for dysuria, hematuria, urinary incontinence, urinary frequency, nocturnal urination.  MS: Negative for joint pain, low back pain.  Derm: Negative for rash or itching.  Neuro: Negative for weakness, abnormal sensation, seizure, frequent headaches, memory loss, confusion.  Psych: Negative for anxiety, depression, suicidal ideation, hallucinations.  Endo: Negative for unusual weight change.  Heme: Negative for bruising or bleeding. Allergy: Negative for rash or hives.    Physical Examination:  BP 109/63   Pulse (!) 57   Temp (!) 97 F (36.1 C) (Oral)   Ht 5\' 9"  (1.753 m)   Wt 185 lb 6.4 oz (84.1 kg)   BMI 27.38 kg/m    General: Well-nourished, well-developed in no acute distress.  Head: Normocephalic, atraumatic.   Eyes: Conjunctiva pink, no icterus. Mouth: masked Neck: Supple  without thyromegaly, masses, or lymphadenopathy.  Lungs: Clear to auscultation bilaterally.  Heart: Regular rate and rhythm, no murmurs rubs or gallops.  Abdomen: Bowel sounds are normal, nontender, nondistended, no hepatosplenomegaly or masses, no abdominal bruits or    hernia , no rebound or  guarding.   Rectal: not performed Extremities: No lower extremity edema. No clubbing or deformities.  Neuro: Alert and oriented x 4 , grossly normal neurologically.  Skin: Warm and dry, no rash or jaundice.   Psych: Alert and cooperative, normal mood and affect.  Labs: Lab Results  Component Value Date   CREATININE 0.70 08/01/2019   BUN 11 08/01/2019   NA 138 08/01/2019   K 4.1 08/01/2019   CL 105 08/01/2019   CO2 25 08/01/2019   Lab Results  Component Value Date   WBC 6.8 08/01/2019   HGB 15.1 08/01/2019   HCT 43.7 08/01/2019   MCV 86.7 08/01/2019   PLT 159 08/01/2019   Lab Results  Component Value Date   ALT 14 07/31/2019   AST 17 07/31/2019   ALKPHOS 84 07/31/2019   BILITOT 0.8 07/31/2019   Lab Results  Component Value Date   LIPASE 25 07/31/2019   No results found for: HGBA1C   Imaging Studies: Ct Abdomen Pelvis W Contrast  Result Date: 07/31/2019 CLINICAL DATA:  68 year old male with abdominal pain, nausea vomiting. Concern for bowel obstruction. EXAM: CT ABDOMEN AND PELVIS WITH CONTRAST TECHNIQUE: Multidetector CT imaging of the abdomen and pelvis was performed using the standard protocol following bolus administration of intravenous contrast. CONTRAST:  177mL OMNIPAQUE IOHEXOL 300 MG/ML  SOLN COMPARISON:  CT of the abdomen pelvis dated 02/25/2017 FINDINGS: Lower chest: The visualized lung bases are unremarkable. No intra-abdominal free air. Small free fluid the pelvis. Hepatobiliary: No focal liver abnormality is seen. No gallstones, gallbladder wall thickening, or biliary dilatation. Pancreas: Unremarkable. No pancreatic ductal dilatation or surrounding inflammatory changes.  Spleen: Normal in size without focal abnormality. Adrenals/Urinary Tract: The adrenal glands are unremarkable. Multiple left renal cysts measure up to 3 cm. Several subcentimeter hypodensities are too small to characterize. There is no hydronephrosis on either side. There is symmetric enhancement and excretion of contrast by both kidneys. The visualized ureters and urinary bladder appear unremarkable. Stomach/Bowel: The stomach is distended. There multiple dilated loops of small bowel throughout the abdomen measuring up to 3.4 cm in diameter. The distal small bowel is collapsed. A point of transition in the right lower abdomen (series 2, image [id number]) were there is abutment of adjacent loops of small bowel, likely related to adhesions. There is postsurgical changes of proximal colon resection with right upper quadrant ileocolic anastomosis. No evidence of obstruction at the level of anastomosis. There is loose stool in the proximal colon. Appendectomy. Vascular/Lymphatic: Mild aortoiliac atherosclerotic disease. No portal venous gas. There is no adenopathy. Reproductive: The prostate and seminal vesicles are grossly unremarkable. No pelvic mass. Other: Midline anterior abdominal wall surgical scar. Musculoskeletal: Degenerative changes of the spine. No acute osseous pathology. IMPRESSION: 1. Small-bowel obstruction with transition zone in the right lower abdomen likely related to adhesions. 2. Postsurgical changes of proximal colon resection with right upper quadrant ileocolic anastomosis. No evidence of obstruction at the level of anastomosis. 3. Aortic Atherosclerosis (ICD10-I70.0). Electronically Signed   By: Anner Crete M.D.   On: 07/31/2019 03:59   Dg Chest Portable 1 View  Result Date: 07/31/2019 CLINICAL DATA:  Nasogastric tube placement EXAM: PORTABLE CHEST 1 VIEW COMPARISON:  Abdomen and pelvis CT from earlier today FINDINGS: Nasogastric tube with tip reaching the stomach. Gas and fluid is seen  within the stomach. Aortic tortuosity. Normal heart size. There is no edema, consolidation, effusion, or pneumothorax. IMPRESSION: The nasogastric tube tip and side-port reaches the distended stomach. Electronically Signed   By:  Monte Fantasia M.D.   On: 07/31/2019 04:31   Dg Abd 2 Views  Result Date: 08/02/2019 CLINICAL DATA:  Acute abdominal pain with nausea/vomiting for several days. EXAM: ABDOMEN - 2 VIEW COMPARISON:  07/31/2019 FINDINGS: The bowel gas pattern is normal.  There is no evidence of free air. Surgical material within the RIGHT abdomen again noted. No acute bony abnormalities are present. IMPRESSION: No acute abnormality. Electronically Signed   By: Margarette Canada M.D.   On: 08/02/2019 09:20   Dg Abd 2 Views  Result Date: 07/31/2019 CLINICAL DATA:  68 year old male with abdominal pain and bloating with nausea vomiting. EXAM: ABDOMEN - 2 VIEW COMPARISON:  [facility] 10/05/2007.  CT Abdomen and Pelvis 02/25/2017. The FINDINGS: Upright and supine views. No pneumoperitoneum. Negative lung bases. Air-fluid level in the stomach, and multiple small-bowel air-fluid levels with small-bowel loops dilated up to 40-45 millimeters diameter. Stable right abdominal surgical clips and staple line. Paucity of large bowel gas. No acute osseous abnormality identified. IMPRESSION: 1. Positive for small bowel obstruction. No free air. 2. Previous right hemicolectomy. Electronically Signed   By: Genevie Ann M.D.   On: 07/31/2019 03:34

## 2019-08-07 ENCOUNTER — Other Ambulatory Visit: Payer: Self-pay

## 2019-08-07 ENCOUNTER — Telehealth: Payer: Self-pay | Admitting: Internal Medicine

## 2019-08-07 DIAGNOSIS — K289 Gastrojejunal ulcer, unspecified as acute or chronic, without hemorrhage or perforation: Secondary | ICD-10-CM

## 2019-08-07 DIAGNOSIS — Z85038 Personal history of other malignant neoplasm of large intestine: Secondary | ICD-10-CM

## 2019-08-07 NOTE — Telephone Encounter (Signed)
[phone] patient was supposed to call back today with information, stated that he has still not gone to the bathroom.

## 2019-08-07 NOTE — Telephone Encounter (Signed)
Ileocolonoscopy with RMR with propofol. No medication adjustments needed.  He has sleep apnea.

## 2019-08-07 NOTE — Telephone Encounter (Signed)
LMOM to call.

## 2019-08-07 NOTE — Telephone Encounter (Signed)
Pt is aware of the plan and will do.

## 2019-08-07 NOTE — Telephone Encounter (Signed)
Continue miralax 17 grams BID until soft BM, then daily as needed.   Add Milk of Magnesia as per package instructions today.

## 2019-08-07 NOTE — Telephone Encounter (Signed)
Orders entered for procedure. Rx has been sent to pharmacy.

## 2019-08-08 NOTE — Telephone Encounter (Signed)
Pre-op appt 10/08/19 at 12:45pm. Appt letter mailed with procedure instructions.

## 2019-08-09 ENCOUNTER — Telehealth: Payer: Self-pay | Admitting: Internal Medicine

## 2019-08-09 NOTE — Telephone Encounter (Signed)
Discussed with him at [place], he may not have a formed stool with everything he has been getting for laxatives. Goal was that he is passing something.   He should stop MOM.  He can continue miralax 1 to 2 times daily as needed.  If he does not feel bloated or constipated he should proceed to the PRN mode of miralax now.

## 2019-08-09 NOTE — Telephone Encounter (Signed)
Pt notified of LSL's recommendations.

## 2019-08-09 NOTE — Telephone Encounter (Signed)
Pt was seen Monday 08/06/2019 and was having some constipation. Pt started Miralax twice daily with Milk of Magnesia once daily. Pt's diarrhea started Tuesday night in a loose form. Pt has continued to take Miralax twice daily and Milk of Magnesia once daily. Pt reports no abdominal pain and has diarrhea 4-6 times a day since he started the Miralax and Milk of Magnesia.

## 2019-08-09 NOTE — Telephone Encounter (Signed)
Pt said he wanted LSL to know that he has had watery stools x one week with no solid BMs. Please advise [phone]

## 2019-08-13 ENCOUNTER — Telehealth: Payer: Self-pay | Admitting: Internal Medicine

## 2019-08-13 ENCOUNTER — Emergency Department (HOSPITAL_COMMUNITY)
Admission: EM | Admit: 2019-08-13 | Discharge: 2019-08-13 | Disposition: A | Discharge status: Home / Self Care | Payer: Medicare Other | Attending: Emergency Medicine | Admitting: Emergency Medicine

## 2019-08-13 ENCOUNTER — Other Ambulatory Visit: Payer: Self-pay

## 2019-08-13 ENCOUNTER — Encounter (HOSPITAL_COMMUNITY): Payer: Self-pay | Admitting: Emergency Medicine

## 2019-08-13 ENCOUNTER — Emergency Department (HOSPITAL_COMMUNITY): Payer: Medicare Other

## 2019-08-13 DIAGNOSIS — Z85038 Personal history of other malignant neoplasm of large intestine: Secondary | ICD-10-CM | POA: Insufficient documentation

## 2019-08-13 DIAGNOSIS — K5909 Other constipation: Secondary | ICD-10-CM

## 2019-08-13 DIAGNOSIS — Z79899 Other long term (current) drug therapy: Secondary | ICD-10-CM | POA: Insufficient documentation

## 2019-08-13 DIAGNOSIS — K5900 Constipation, unspecified: Secondary | ICD-10-CM | POA: Insufficient documentation

## 2019-08-13 LAB — URINALYSIS, ROUTINE W REFLEX MICROSCOPIC
Bilirubin Urine: NEGATIVE
Glucose, UA: NEGATIVE mg/dL
Hgb urine dipstick: NEGATIVE
Ketones, ur: NEGATIVE mg/dL
Leukocytes,Ua: NEGATIVE
Nitrite: NEGATIVE
Protein, ur: NEGATIVE mg/dL
Specific Gravity, Urine: 1.01 (ref 1.005–1.030)
pH: 7 (ref 5.0–8.0)

## 2019-08-13 LAB — COMPREHENSIVE METABOLIC PANEL
ALT: 13 U/L (ref 0–44)
AST: 17 U/L (ref 15–41)
Albumin: 3.7 g/dL (ref 3.5–5.0)
Alkaline Phosphatase: 79 U/L (ref 38–126)
Anion gap: 8 (ref 5–15)
BUN: 14 mg/dL (ref 8–23)
CO2: 28 mmol/L (ref 22–32)
Calcium: 9.1 mg/dL (ref 8.9–10.3)
Chloride: 103 mmol/L (ref 98–111)
Creatinine, Ser: 0.99 mg/dL (ref 0.61–1.24)
GFR calc Af Amer: >60 mL/min (ref >60)
GFR calc non Af Amer: >60 mL/min (ref >60)
Glucose, Bld: 89 mg/dL (ref 70–99)
Potassium: 4.4 mmol/L (ref 3.5–5.1)
Sodium: 139 mmol/L (ref 135–145)
Total Bilirubin: 0.7 mg/dL (ref 0.3–1.2)
Total Protein: 6.4 g/dL — ABNORMAL LOW (ref 6.5–8.1)

## 2019-08-13 LAB — CBC
HCT: 42.6 % (ref 39.0–52.0)
Hemoglobin: 14.1 g/dL (ref 13.0–17.0)
MCH: 29.6 pg (ref 26.0–34.0)
MCHC: 33.1 g/dL (ref 30.0–36.0)
MCV: 89.5 fL (ref 80.0–100.0)
Platelets: 149 10*3/uL — ABNORMAL LOW (ref 150–400)
RBC: 4.76 MIL/uL (ref 4.22–5.81)
RDW: 12.7 % (ref 11.5–15.5)
WBC: 4.6 10*3/uL (ref 4.0–10.5)
nRBC: 0 % (ref 0.0–0.2)

## 2019-08-13 LAB — LIPASE, BLOOD: Lipase: 34 U/L (ref 11–51)

## 2019-08-13 MED ORDER — SODIUM CHLORIDE 0.9% FLUSH: 3.0000 mL | Freq: Once | INTRAVENOUS | Status: DC

## 2019-08-13 MED ORDER — IOHEXOL 300 MG/ML  SOLN
100.0000 mL | Freq: Once | INTRAMUSCULAR | Status: AC | PRN
  Administered 2019-08-13: 100 mL via INTRAVENOUS

## 2019-08-13 NOTE — Telephone Encounter (Signed)
Pt wanted LSL to know that he had followed all the protocols this weekend and he still hasn't had a BM. He thinks he will go on to the ER and if LSL needs to follow up with him to call or [facility] at [phone]

## 2019-08-13 NOTE — Telephone Encounter (Signed)
Reviewed his labs and CT. No obstruction. Per ED report, patient taking Miralax TID and colace bid along with enema.   I feel like we need to simplify bowel regimen. Since his CT does not show significant stool, let's have him stop everything as far as laxatives/stool softeners EXCEPT Miralax. I would like him to use Miralax once per day only.

## 2019-08-13 NOTE — Discharge Instructions (Addendum)
Your laboratory results were within normal limits today.  Your CT showed resolution of small bowel obstruction and no signs of infection.  Follow-up with primary care physician along with GI as needed.  You may continue your bowel regimen while at home.

## 2019-08-13 NOTE — ED Triage Notes (Signed)
Pt c/o 11 days of constipation, he was recently at [facility] for a small bowel obstruction. He reports using meds rx by GI but has not produced any normal bowel movements.

## 2019-08-13 NOTE — Telephone Encounter (Signed)
LSL, I spoke with pt. He is headed to the ED because he feels he has a blockage. Pt d/c the Milk of Magnesia and took the Miralax as directed. Pt says that he had to change his underwear 3 times last night due to stool coming out on it's own. Pt said that he feels a CT scan will explain if he has a blockage. Pt will call back if needed.

## 2019-08-13 NOTE — ED Provider Notes (Signed)
[facility] EMERGENCY DEPARTMENT Provider Note   CSN: [id number] Arrival date & time: 08/13/19  1120     History   Chief Complaint Chief Complaint  Patient presents with  . Constipation    HPI VINCIENT SCHIRRA is a 68 y.o. male.     68 y.o male with a PMH of Anemia, colon cancer resected,BPH, SBO 07/31/2019 presents to the ED with a chief complaint of constipation. He reports being seen at [facility] 2 weeks ago for SBO had an NG tube placed, and a suppository with improvement in symptoms. According to patient he his obstruction resolved with therapy and he did not have to have surgery.  He is currently followed by gastroenterologist Dr. Dudley Major in [facility].  Patient reports since his discharge from the hospital he is attempted to have bowel movements, has been successful with bowel movements but states there is a brown watery color to his stool.  He has been taking MiraLAX 3 times a day, Colace twice a day and had an enema last night.  He reports his stools have been loose, has had 3-4 stools in the past couple days, does report that he has had some stool incontinence with ambulation.  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  He denies any abdominal pain, nausea, does have significant amount of surgical history to his abdomen.  No fevers.  The history is provided by the patient.    Past Medical History:  Diagnosis Date  . Anemia    hx of   . Arthritis    fingers   . BPH (benign prostatic hyperplasia)   . Colon cancer ([place])    stage II, diagnosed 2007  . Complication of anesthesia    vagal response after umbilcal hernia at [place]  . Depression   . Diabetes mellitus    no as of 09/08/16 due to weight loss   . GERD (gastroesophageal reflux disease)   . Glaucoma 2012  . History of kidney stones   . Sleep apnea    wears CiPaP at night  . Staph infection 2008   left side of face    Patient Active  Problem List   Diagnosis Date Noted  . Constipation 08/06/2019  . Small bowel obstruction ([place]) 07/31/2019  . Glaucoma 07/31/2019  . SBO (small bowel obstruction) ([place]) 07/31/2019  . Sleep apnea   . BPH (benign prostatic hyperplasia)   . d 09/12/2018  . Major depressive disorder, recurrent episode ([place]) 09/12/2018  . Bipolar disorder, unspecified ([place]) 09/12/2018  . Nausea without vomiting 09/19/2017  . Rectal pain 10/27/2016  . Benign prostatic hyperplasia with urinary obstruction 09/09/2016  . Helicobacter pylori gastritis 02/23/2016  . Esophageal mass   . Mucosal abnormality of stomach   . GERD (gastroesophageal reflux disease) 10/20/2015  . Abnormal liver function tests 10/20/2015  . Esophageal dysphagia 10/20/2015  . Fatty liver 10/20/2015  . Chest pain 12/21/2012  . Type 2 diabetes mellitus ([place]) 12/21/2012  . Change in stool 04/25/2012  . Musculoskeletal pain 04/20/2012  . Strain of hip adductor muscle 04/20/2012  . Hip pain 04/20/2012  . GASTROESOPHAGEAL REFLUX DISEASE, HX OF 04/20/2010  . Malignant neoplasm of colon ([place]) 08/14/2009  . VITAMIN B12 DEFICIENCY 08/14/2009  . OBESITY 08/14/2009    Past Surgical History:  Procedure Laterality Date  . APPENDECTOMY    . CARDIAC CATHETERIZATION    . COLECTOMY  2007   right hemicolectomy  .  COLONOSCOPY  05/11/10   normal rectum,normal residual colon with some angry appearring anastomtic mucosa with some erosions and oozing, bx unremarkable  . COLONOSCOPY  05/16/2012   Dr. Gala Romney- normal rectum, sigmoid diverticulosis, anastomotic ulcers. bx= acute ulcer and benign colonic mucosa with intramucosal lympoid aggregates  . COLONOSCOPY N/A 11/17/2016   Dr. Gala Romney: Ulcerated mucosa were present at the anastomosis. benign bx. next TCS in 10/2021.  . ESOPHAGEAL DILATION N/A 10/28/2015   Procedure: ESOPHAGEAL DILATION;  Surgeon: Daneil Dolin, MD;  [facility];  Service: Endoscopy;  Laterality: N/A;  .  ESOPHAGOGASTRODUODENOSCOPY N/A 10/28/2015   RMR: ulcerated proximal esophageal mass ad described status post biopsy. Abnormal distal esophagus biopsy. status post biopsy. Hiatal hernia . abnormal gastric mucosa doubt clinical significance status post biopsy  . ESOPHAGOGASTRODUODENOSCOPY (EGD) WITH PROPOFOL N/A 11/07/2017   Procedure: ESOPHAGOGASTRODUODENOSCOPY (EGD) WITH PROPOFOL;  Surgeon: Daneil Dolin, MD;  Location: [facility];  Service: Endoscopy;  Laterality: N/A;  8:15am  . EUS N/A 11/20/2015   Procedure: UPPER ENDOSCOPIC ULTRASOUND (EUS) LINEAR;  Surgeon: Milus Banister, MD;  Location: [facility] ENDOS[facility];  Service: Endoscopy;  Laterality: N/A;  . HEMORRHOID SURGERY  12/31/2011   Procedure: HEMORRHOIDECTOMY;  Surgeon: Jamesetta So;  Location: [facility] ORS;  Service: General;  Laterality: N/A;  . HERNIA REPAIR     umbilical  . KIDNEY STONE SURGERY    . PILONIDAL CYST / SINUS EXCISION  1976  . SKIN LESION EXCISION     left shoulder/pre cancerous  . TRANSURETHRAL RESECTION OF PROSTATE N/A 09/09/2016   Procedure: TRANSURETHRAL RESECTION OF THE PROSTATE (TURP);  Surgeon: Irine Seal, MD;  Location: [facility] OR[facility];  Service: Urology;  Laterality: N/A;        Home Medications    Prior to Admission medications   Medication Sig Start Date End Date Taking? Authorizing Provider  brimonidine (ALPHAGAN) 0.2 % ophthalmic solution Place 1 drop into both eyes 2 (two) times daily.     [provider]  buPROPion (WELLBUTRIN XL) 150 MG 24 hr tablet Take 3 tablets (450 mg total) by mouth every morning. 02/12/19   Cottle, Billey Co., MD  docusate sodium (COLACE) 100 MG capsule Take 1 capsule (100 mg total) by mouth 2 (two) times daily as needed. 08/03/19 08/02/20  Virl Cagey, MD  dorzolamide-timolol (COSOPT) 22.3-6.8 MG/ML ophthalmic solution Place 1 drop into both eyes 2 (two) times daily.    [provider]  fluticasone (FLONASE) 50 MCG/ACT nasal spray Place 2 sprays into both nostrils daily  as needed.  09/21/18   [provider]  lamoTRIgine (LAMICTAL) 200 MG tablet Take 1 tablet (200 mg total) by mouth daily. 02/12/19   Cottle, Billey Co., MD  latanoprost (XALATAN) 0.005 % ophthalmic solution Place 1 drop into both eyes at bedtime.    [provider]  pantoprazole (PROTONIX) 40 MG tablet Take 1 tablet (40 mg total) by mouth 2 (two) times daily before a meal. 07/10/19   Mahala Menghini, PA-C  polyethylene glycol-electrolytes (TRILYTE) 420 g solution Take 4,000 mLs by mouth as directed. 08/06/19   Rourk, Cristopher Estimable, MD  traZODone (DESYREL) 50 MG tablet Take 1-2 tablets (50-100 mg total) by mouth at bedtime as needed for sleep (insomnia). 02/12/19   Cottle, Billey Co., MD  vitamin B-12 (CYANOCOBALAMIN) 500 MCG tablet Take 500 mcg every evening by mouth.    [provider]  [name] capsule TAKE 1 CAPSULE(1.5 MG) BY MOUTH DAILY Patient taking differently:  Take 1.5 mg by mouth daily.  07/02/19   Cottle, Billey Co., MD    Family History Family History  Problem Relation Age of Onset  . Lung cancer Father   . Diabetes Mother   . Anesthesia problems Neg Hx   . Hypotension Neg Hx   . Malignant hyperthermia Neg Hx   . Pseudochol deficiency Neg Hx     Social History Social History   Tobacco Use  . Smoking status: Never Smoker  . Smokeless tobacco: Never Used  . Tobacco comment: Never smoked  Substance Use Topics  . Alcohol use: No    Alcohol/week: 0.0 standard drinks  . Drug use: No     Allergies   Ambien [zolpidem tartrate], Bactrim [sulfamethoxazole-trimethoprim], Dilaudid [hydromorphone hcl], and Morphine and related   Review of Systems Review of Systems  Constitutional: Negative for chills and fever.  HENT: Negative for ear pain and sore throat.   Eyes: Negative for pain and visual disturbance.  Respiratory: Negative for cough and shortness of breath.   Cardiovascular: Negative for chest pain and palpitations.  Gastrointestinal: Positive for  constipation and diarrhea. Negative for abdominal distention, abdominal pain, anal bleeding, blood in stool, nausea, rectal pain and vomiting.  Genitourinary: Negative for dysuria, flank pain and hematuria.  Musculoskeletal: Negative for arthralgias and back pain.  Skin: Negative for color change and rash.  Neurological: Negative for seizures and syncope.  All other systems reviewed and are negative.    Physical Exam Updated Vital Signs BP 137/76   Pulse (!) 51   Temp 98.7 F (37.1 C) (Oral)   Resp 16   SpO2 95%   Physical Exam Vitals signs and nursing note reviewed.  Constitutional:      Appearance: He is well-developed. He is not ill-appearing.     Comments: Non-ill-appearing.  HENT:     Head: Normocephalic and atraumatic.  Eyes:     General: No scleral icterus.    Pupils: Pupils are equal, round, and reactive to light.  Neck:     Musculoskeletal: Normal range of motion.  Cardiovascular:     Heart sounds: Normal heart sounds.  Pulmonary:     Effort: Pulmonary effort is normal.     Breath sounds: Normal breath sounds. No wheezing.  Chest:     Chest wall: No tenderness.  Abdominal:     General: A surgical scar is present. Bowel sounds are normal. There is no distension.     Palpations: Abdomen is soft.     Tenderness: There is no abdominal tenderness.     Hernia: A hernia is present. Hernia is present in the umbilical area.     Comments: Abdomen appears soft, no tenderness to palpation.  Hypoactive bowel sounds on the left lower quadrant.  Bowel sounds present and normal on the right lower quadrant.  Musculoskeletal:        General: No tenderness or deformity.  Skin:    General: Skin is warm and dry.  Neurological:     Mental Status: He is alert and oriented to person, place, and time.      ED Treatments / Results  Labs (all labs ordered are listed, but only abnormal results are displayed) Labs Reviewed  COMPREHENSIVE METABOLIC PANEL - Abnormal; Notable for  the following components:      Result Value   Total Protein 6.4 (*)    All other components within normal limits  CBC - Abnormal; Notable for the following components:   Platelets 149 (*)  All other components within normal limits  LIPASE, BLOOD  URINALYSIS, ROUTINE W REFLEX MICROSCOPIC    EKG None  Radiology No results found.  Procedures Procedures (including critical care time)  Medications Ordered in ED Medications  sodium chloride flush (NS) 0.9 % injection 3 mL (has no administration in time range)     Initial Impression / Assessment and Plan / ED Course  I have reviewed the triage vital signs and the nursing notes.  Pertinent labs & imaging results that were available during my care of the patient were reviewed by me and considered in my medical decision making (see chart for details).       Patient with extensive abdominal history presents to the ED with complaints of constipation.  Seen at [facility] 11 days ago for a small bowel obstruction, had an NG tube placed, had an enema and had small bowel obstruction resolved with bowel rest.  He reports he is followed by GI at [facility].  Today he reports he has had multiple episodes of diarrhea, brown color in nature, states he has been taking MiraLAX, Colace, enema with bowel movements however states that he does not feel like he is fully clearing out his bowels.   During primary evaluation patient has diminished bowel sounds in the left lower quadrant, bowel sounds are present on the right lower quadrant.  Abdomen appears soft, scar present, umbilical hernia present.  Vitals are within normal limits, he is afebrile.  CMP shows no electrolyte derangement, LFTs are within normal limits.  Creatinine level is within normal limits.  UA showed no nitrates, leukocytes, white blood cell count.  Lipase level is within normal limits, doubt pancreatitis.  CBC showed no leukocytosis, hemoglobin is within normal limits.  Patient does  not report any pain in his abdomen, does report that he feels he is not fully releasing his bowels although he has had several episodes of brown stool.  Will obtain CT abdomen as patient does have a previous history of adenocarcinoma and recent SBO.  3:33 PM patient was updated on CT abdomen which is currently pending, he reports no discomfort or pain.   4:00 PM CT was called to question delay in study, stated they were waiting on IV access. Patient has IV access.    Patient signed out to incoming provider, pending CT abdomen and appropriate disposition.    Portions of this note were generated with Lobbyist. Dictation errors may occur despite best attempts at proofreading.  Final Clinical Impressions(s) / ED Diagnoses   Final diagnoses:  Other constipation    ED Discharge Orders    None       Janeece Fitting, Hershal Coria 08/13/19 1602    Quintella Reichert, MD 08/14/19 [phone]

## 2019-08-13 NOTE — ED Notes (Signed)
Patient transported to CT 

## 2019-08-14 NOTE — Telephone Encounter (Signed)
Pt returned call. Pt notified of ct/lab results. Pt is going to follow LSL's recommendations and take Miralax once per day as directed.

## 2019-08-14 NOTE — Telephone Encounter (Signed)
lmom waiting on a return call.  

## 2019-08-28 ENCOUNTER — Ambulatory Visit: Payer: Medicare Other | Admitting: Psychiatry

## 2019-08-28 ENCOUNTER — Telehealth: Payer: Self-pay | Admitting: Internal Medicine

## 2019-08-28 NOTE — Telephone Encounter (Signed)
Pt's procedure hasn't been cancelled. Tried to call pt, no answer, LMOVM and informed him.

## 2019-08-28 NOTE — Telephone Encounter (Signed)
Pt LMOM that he didn't see on Mychart that his upcoming procedure with RMR on 10/22 was listed and he wanted to make sure it wasn't cancelled

## 2019-09-03 ENCOUNTER — Ambulatory Visit: Payer: Medicare Other | Admitting: Gastroenterology

## 2019-09-07 ENCOUNTER — Ambulatory Visit (INDEPENDENT_AMBULATORY_CARE_PROVIDER_SITE_OTHER): Payer: Medicare Other | Admitting: Urology

## 2019-09-07 DIAGNOSIS — N401 Enlarged prostate with lower urinary tract symptoms: Secondary | ICD-10-CM | POA: Diagnosis not present

## 2019-09-07 DIAGNOSIS — N393 Stress incontinence (female) (male): Secondary | ICD-10-CM | POA: Diagnosis not present

## 2019-09-27 ENCOUNTER — Ambulatory Visit: Payer: Medicare Other | Admitting: Psychiatry

## 2019-10-01 ENCOUNTER — Other Ambulatory Visit: Payer: Self-pay | Admitting: Psychiatry

## 2019-10-08 ENCOUNTER — Other Ambulatory Visit: Payer: Self-pay

## 2019-10-08 ENCOUNTER — Encounter (HOSPITAL_COMMUNITY)
Admission: RE | Admit: 2019-10-08 | Discharge: 2019-10-08 | Disposition: A | Discharge status: Home / Self Care | Payer: Medicare Other | Source: Ambulatory Visit | Attending: Internal Medicine | Admitting: Internal Medicine

## 2019-10-08 ENCOUNTER — Encounter (HOSPITAL_COMMUNITY): Payer: Self-pay

## 2019-10-09 ENCOUNTER — Other Ambulatory Visit (HOSPITAL_COMMUNITY)
Admission: RE | Admit: 2019-10-09 | Discharge: 2019-10-09 | Disposition: A | Discharge status: Home / Self Care | Payer: Medicare Other | Source: Ambulatory Visit | Attending: Internal Medicine | Admitting: Internal Medicine

## 2019-10-09 DIAGNOSIS — Z20828 Contact with and (suspected) exposure to other viral communicable diseases: Secondary | ICD-10-CM | POA: Insufficient documentation

## 2019-10-09 DIAGNOSIS — Z01812 Encounter for preprocedural laboratory examination: Secondary | ICD-10-CM | POA: Insufficient documentation

## 2019-10-09 LAB — SARS CORONAVIRUS 2 (TAT 6-24 HRS): SARS Coronavirus 2: NEGATIVE

## 2019-10-11 ENCOUNTER — Ambulatory Visit (HOSPITAL_COMMUNITY): Payer: Medicare Other | Admitting: Anesthesiology

## 2019-10-11 ENCOUNTER — Other Ambulatory Visit: Payer: Self-pay

## 2019-10-11 ENCOUNTER — Encounter (HOSPITAL_COMMUNITY): Payer: Self-pay | Admitting: Emergency Medicine

## 2019-10-11 ENCOUNTER — Encounter (HOSPITAL_COMMUNITY)
Admission: RE | Disposition: A | Discharge status: Home / Self Care | Payer: Self-pay | Source: Home / Self Care | Attending: Internal Medicine

## 2019-10-11 ENCOUNTER — Ambulatory Visit (HOSPITAL_COMMUNITY)
Admission: RE | Admit: 2019-10-11 | Discharge: 2019-10-11 | Disposition: A | Discharge status: Home / Self Care | Payer: Medicare Other | Attending: Internal Medicine | Admitting: Internal Medicine

## 2019-10-11 DIAGNOSIS — Z801 Family history of malignant neoplasm of trachea, bronchus and lung: Secondary | ICD-10-CM | POA: Diagnosis not present

## 2019-10-11 DIAGNOSIS — Z888 Allergy status to other drugs, medicaments and biological substances status: Secondary | ICD-10-CM | POA: Insufficient documentation

## 2019-10-11 DIAGNOSIS — E119 Type 2 diabetes mellitus without complications: Secondary | ICD-10-CM | POA: Insufficient documentation

## 2019-10-11 DIAGNOSIS — Z833 Family history of diabetes mellitus: Secondary | ICD-10-CM | POA: Insufficient documentation

## 2019-10-11 DIAGNOSIS — Z87442 Personal history of urinary calculi: Secondary | ICD-10-CM | POA: Insufficient documentation

## 2019-10-11 DIAGNOSIS — K633 Ulcer of intestine: Secondary | ICD-10-CM | POA: Insufficient documentation

## 2019-10-11 DIAGNOSIS — Z885 Allergy status to narcotic agent status: Secondary | ICD-10-CM | POA: Insufficient documentation

## 2019-10-11 DIAGNOSIS — N400 Enlarged prostate without lower urinary tract symptoms: Secondary | ICD-10-CM | POA: Diagnosis not present

## 2019-10-11 DIAGNOSIS — Z79899 Other long term (current) drug therapy: Secondary | ICD-10-CM | POA: Diagnosis not present

## 2019-10-11 DIAGNOSIS — D649 Anemia, unspecified: Secondary | ICD-10-CM | POA: Insufficient documentation

## 2019-10-11 DIAGNOSIS — Z9049 Acquired absence of other specified parts of digestive tract: Secondary | ICD-10-CM | POA: Diagnosis not present

## 2019-10-11 DIAGNOSIS — Z1211 Encounter for screening for malignant neoplasm of colon: Secondary | ICD-10-CM | POA: Insufficient documentation

## 2019-10-11 DIAGNOSIS — H409 Unspecified glaucoma: Secondary | ICD-10-CM | POA: Diagnosis not present

## 2019-10-11 DIAGNOSIS — Z85038 Personal history of other malignant neoplasm of large intestine: Secondary | ICD-10-CM | POA: Diagnosis not present

## 2019-10-11 DIAGNOSIS — K289 Gastrojejunal ulcer, unspecified as acute or chronic, without hemorrhage or perforation: Secondary | ICD-10-CM

## 2019-10-11 DIAGNOSIS — K5730 Diverticulosis of large intestine without perforation or abscess without bleeding: Secondary | ICD-10-CM | POA: Diagnosis not present

## 2019-10-11 DIAGNOSIS — F319 Bipolar disorder, unspecified: Secondary | ICD-10-CM | POA: Insufficient documentation

## 2019-10-11 DIAGNOSIS — G709 Myoneural disorder, unspecified: Secondary | ICD-10-CM | POA: Insufficient documentation

## 2019-10-11 DIAGNOSIS — K219 Gastro-esophageal reflux disease without esophagitis: Secondary | ICD-10-CM | POA: Diagnosis not present

## 2019-10-11 DIAGNOSIS — G4730 Sleep apnea, unspecified: Secondary | ICD-10-CM | POA: Insufficient documentation

## 2019-10-11 DIAGNOSIS — M1990 Unspecified osteoarthritis, unspecified site: Secondary | ICD-10-CM | POA: Diagnosis not present

## 2019-10-11 HISTORY — PX: COLONOSCOPY WITH PROPOFOL: SHX5780

## 2019-10-11 HISTORY — PX: BIOPSY: SHX5522

## 2019-10-11 LAB — GLUCOSE, CAPILLARY: Glucose-Capillary: 79 mg/dL (ref 70–99)

## 2019-10-11 SURGERY — COLONOSCOPY WITH PROPOFOL
Anesthesia: General

## 2019-10-11 MED ORDER — LACTATED RINGERS IV SOLN
INTRAVENOUS | Status: DC
  Administered 2019-10-11: 13:00:00 via INTRAVENOUS

## 2019-10-11 MED ORDER — MIDAZOLAM HCL 2 MG/2ML IJ SOLN: 0.5000 mg | Freq: Once | INTRAMUSCULAR | Status: DC | PRN

## 2019-10-11 MED ORDER — PROMETHAZINE HCL 25 MG/ML IJ SOLN: 6.2500 mg | INTRAMUSCULAR | Status: DC | PRN

## 2019-10-11 MED ORDER — PROPOFOL 500 MG/50ML IV EMUL
INTRAVENOUS | Status: DC | PRN
  Administered 2019-10-11: 150 ug/kg/min via INTRAVENOUS

## 2019-10-11 MED ORDER — CHLORHEXIDINE GLUCONATE CLOTH 2 % EX PADS: 6.0000 | MEDICATED_PAD | Freq: Once | CUTANEOUS | Status: DC

## 2019-10-11 MED ORDER — PROPOFOL 10 MG/ML IV BOLUS
INTRAVENOUS | Status: AC
  Filled 2019-10-11: qty 20

## 2019-10-11 NOTE — H&P (Signed)
@LOGO @   Primary Care Physician:  Rory Percy, MD Primary Gastroenterologist:  Dr.   Pre-Procedure History & Physical: HPI:  [name] is a 68 y.o. male here for surveillance colonoscopy.  Recent SBO.  History of stage II colon cancer status post right hemicolectomy.  Past Medical History:  Diagnosis Date  . Anemia    hx of   . Arthritis    fingers   . BPH (benign prostatic hyperplasia)   . Colon cancer ([place])    stage II, diagnosed 2007  . Complication of anesthesia    vagal response after umbilcal hernia at [facility]  . Depression   . Diabetes mellitus    no as of 09/08/16 due to weight loss   . GERD (gastroesophageal reflux disease)   . Glaucoma 2012  . History of kidney stones   . Sleep apnea    wears CiPaP at night  . Staph infection 2008   left side of face    Past Surgical History:  Procedure Laterality Date  . APPENDECTOMY    . CARDIAC CATHETERIZATION    . COLECTOMY  2007   right hemicolectomy  . COLONOSCOPY  05/11/10   normal rectum,normal residual colon with some angry appearring anastomtic mucosa with some erosions and oozing, bx unremarkable  . COLONOSCOPY  05/16/2012   Dr. Gala Romney- normal rectum, sigmoid diverticulosis, anastomotic ulcers. bx= acute ulcer and benign colonic mucosa with intramucosal lympoid aggregates  . COLONOSCOPY N/A 11/17/2016   Dr. Gala Romney: Ulcerated mucosa were present at the anastomosis. benign bx. next TCS in 10/2021.  . ESOPHAGEAL DILATION N/A 10/28/2015   Procedure: ESOPHAGEAL DILATION;  Surgeon: Daneil Dolin, MD;  [facility];  Service: Endoscopy;  Laterality: N/A;  . ESOPHAGOGASTRODUODENOSCOPY N/A 10/28/2015   RMR: ulcerated proximal esophageal mass ad described status post biopsy. Abnormal distal esophagus biopsy. status post biopsy. Hiatal hernia . abnormal gastric mucosa doubt clinical significance status post biopsy  . ESOPHAGOGASTRODUODENOSCOPY (EGD) WITH PROPOFOL N/A 11/07/2017   Procedure:  ESOPHAGOGASTRODUODENOSCOPY (EGD) WITH PROPOFOL;  Surgeon: Daneil Dolin, MD;  [facility];  Service: Endoscopy;  Laterality: N/A;  8:15am  . EUS N/A 11/20/2015   Procedure: UPPER ENDOSCOPIC ULTRASOUND (EUS) LINEAR;  Surgeon: Milus Banister, MD;  [facility];  Service: Endoscopy;  Laterality: N/A;  . HEMORRHOID SURGERY  12/31/2011   Procedure: HEMORRHOIDECTOMY;  Surgeon: Jamesetta So;  Location: [facility] OR[facility];  Service: General;  Laterality: N/A;  . HERNIA REPAIR     umbilical  . KIDNEY STONE SURGERY    . PILONIDAL CYST / SINUS EXCISION  1976  . SKIN LESION EXCISION     left shoulder/pre cancerous  . TRANSURETHRAL RESECTION OF PROSTATE N/A 09/09/2016   Procedure: TRANSURETHRAL RESECTION OF THE PROSTATE (TURP);  Surgeon: Irine Seal, MD;  [facility];  Service: Urology;  Laterality: N/A;    Prior to Admission medications   Medication Sig Start Date End Date Taking? Authorizing Provider  brimonidine (ALPHAGAN) 0.2 % ophthalmic solution Place 1 drop into both eyes 2 (two) times daily.    Yes [provider]  buPROPion (WELLBUTRIN XL) 150 MG 24 hr tablet Take 3 tablets (450 mg total) by mouth every morning. 02/12/19  Yes Cottle, Billey Co., MD  docusate sodium (COLACE) 100 MG capsule Take 1 capsule (100 mg total) by mouth 2 (two) times daily as needed. 08/03/19 08/02/20 Yes Virl Cagey, MD  dorzolamide-timolol (COSOPT) 22.3-6.8 MG/ML ophthalmic solution Place 1 drop into both eyes  2 (two) times daily.   Yes [provider]  fluticasone (FLONASE) 50 MCG/ACT nasal spray Place 2 sprays into both nostrils daily as needed for allergies.  09/21/18  Yes [provider]  lamoTRIgine (LAMICTAL) 200 MG tablet Take 1 tablet (200 mg total) by mouth daily. 02/12/19  Yes Cottle, Billey Co., MD  latanoprost (XALATAN) 0.005 % ophthalmic solution Place 1 drop into both eyes at bedtime.   Yes [provider]  pantoprazole (PROTONIX) 40 MG tablet Take 1  tablet (40 mg total) by mouth 2 (two) times daily before a meal. 07/10/19  Yes Mahala Menghini, PA-C  polyethylene glycol (MIRALAX / GLYCOLAX) 17 g packet Take 17 g by mouth daily.   Yes [provider]  traZODone (DESYREL) 50 MG tablet Take 1-2 tablets (50-100 mg total) by mouth at bedtime as needed for sleep (insomnia). 02/12/19  Yes Cottle, Billey Co., MD  vitamin B-12 (CYANOCOBALAMIN) 500 MCG tablet Take 500 mcg every evening by mouth.   Yes [provider]  [name] capsule TAKE 1 CAPSULE(1.5 MG) BY MOUTH DAILY Patient taking differently: Take 1.5 mg by mouth daily.  10/01/19  Yes Cottle, Billey Co., MD    Allergies as of 08/07/2019 - Review Complete 08/06/2019  Allergen Reaction Noted  . Ambien [zolpidem tartrate]  09/12/2018  . Bactrim [sulfamethoxazole-trimethoprim] Hives and Swelling 09/08/2016  . Dilaudid [hydromorphone hcl] Other (See Comments) 09/08/2016  . Morphine and related Nausea And Vomiting 09/08/2016    Family History  Problem Relation Age of Onset  . Lung cancer Father   . Diabetes Mother   . Anesthesia problems Neg Hx   . Hypotension Neg Hx   . Malignant hyperthermia Neg Hx   . Pseudochol deficiency Neg Hx     Social History   Socioeconomic History  . Marital status: Married    Spouse name: Not on file  . Number of children: 1  . Years of education: Not on file  . Highest education level: Not on file  Occupational History  . Occupation: school system    Employer: [employer]  . Financial resource strain: Not on file  . Food insecurity    Worry: Not on file    Inability: Not on file  . Transportation needs    Medical: Not on file    Non-medical: Not on file  Tobacco Use  . Smoking status: Never Smoker  . Smokeless tobacco: Never Used  . Tobacco comment: Never smoked  Substance and Sexual Activity  . Alcohol use: No    Alcohol/week: 0.0 standard drinks  . Drug use: No  . Sexual activity: Yes    Birth  control/protection: None  Lifestyle  . Physical activity    Days per week: Not on file    Minutes per session: Not on file  . Stress: Not on file  Relationships  . Social Herbalist on phone: Not on file    Gets together: Not on file    Attends religious service: Not on file    Active member of club or organization: Not on file    Attends meetings of clubs or organizations: Not on file    Relationship status: Not on file  . Intimate partner violence    Fear of current or ex partner: Not on file    Emotionally abused: Not on file    Physically abused: Not on file    Forced sexual activity: Not on file  Other Topics Concern  . Not on file  Social History Narrative  . Not on file    Review of Systems: See HPI, otherwise negative ROS  Physical Exam: BP (!) 161/77   Pulse (!) 54   Temp 98.2 F (36.8 C) (Oral)   Resp 17   SpO2 99%  General:   Alert,  Well-developed, well-nourished, pleasant and cooperative in NAD Neck:  Supple; no masses or thyromegaly. No significant cervical adenopathy. Lungs:  Clear throughout to auscultation.   No wheezes, crackles, or rhonchi. No acute distress. Heart:  Regular rate and rhythm; no murmurs, clicks, rubs,  or gallops.   Impression/Plan: 68 year old gentleman history stage II colon cancer status post sigmoid resection.  Small bowel obstruction recently resolved with conservative measures.  He is here for surveillance examination. The risks, benefits, limitations, alternatives and imponderables have been reviewed with the patient. Questions have been answered. All parties are agreeable.      Notice: This dictation was prepared with Dragon dictation along with smaller phrase technology. Any transcriptional errors that result from this process are unintentional and may not be corrected upon review.

## 2019-10-11 NOTE — Anesthesia Postprocedure Evaluation (Signed)
Anesthesia Post Note  Patient: [name]  Procedure(s) Performed: COLONOSCOPY WITH PROPOFOL (N/A ) BIOPSY  Patient location during evaluation: PACU Anesthesia Type: MAC Level of consciousness: awake, awake and alert, oriented and patient cooperative Pain management: pain level controlled Vital Signs Assessment: post-procedure vital signs reviewed and stable Respiratory status: nonlabored ventilation, spontaneous breathing and respiratory function stable Cardiovascular status: stable Postop Assessment: no apparent nausea or vomiting Anesthetic complications: no     Last Vitals:  Vitals:   10/11/19 1259  BP: (!) 161/77  Pulse: (!) 54  Resp: 17  Temp: 36.8 C  SpO2: 99%    Last Pain:  Vitals:   10/11/19 1503  TempSrc:   PainSc: 0-No pain                 Sherah Lund

## 2019-10-11 NOTE — Anesthesia Preprocedure Evaluation (Signed)
Anesthesia Evaluation  Patient identified by MRN, date of birth, ID band Patient awake  General Assessment Comment:Denied any personal or FH of Anesthesia issues   Reviewed: Allergy & Precautions, NPO status , Patient's Chart, lab work & pertinent test results  History of Anesthesia Complications Negative for: history of anesthetic complications  Airway Mallampati: II  TM Distance: >3 FB Neck ROM: Full    Dental no notable dental hx. (+) Teeth Intact   Pulmonary sleep apnea ,  Reports OSA in the past with Bipap use  States off for over 3 years after 50 lb weight loss   Pulmonary exam normal breath sounds clear to auscultation       Cardiovascular Exercise Tolerance: Good negative cardio ROS Normal cardiovascular examI Rhythm:Regular Rate:Normal  States can walk miles - denies MI/DOE   Neuro/Psych Depression Bipolar Disorder  Neuromuscular disease negative psych ROS   GI/Hepatic Neg liver ROS, Bowel prep,GERD  Medicated and Controlled,H/o Colon Ca resected in 2006 Denies CT or RT Had bowel obstx in 07/2019 Here for colonoscopy    Endo/Other  negative endocrine ROSdiabetes  Renal/GU negative Renal ROS  negative genitourinary   Musculoskeletal  (+) Arthritis , Osteoarthritis,    Abdominal   Peds negative pediatric ROS (+)  Hematology negative hematology ROS (+) anemia ,   Anesthesia Other Findings   Reproductive/Obstetrics negative OB ROS                             Anesthesia Physical Anesthesia Plan  ASA: III  Anesthesia Plan: General   Post-op Pain Management:    Induction: Intravenous  PONV Risk Score and Plan: 2 and TIVA, Propofol infusion and Treatment may vary due to age or medical condition  Airway Management Planned: Simple Face Mask and Nasal Cannula  Additional Equipment:   Intra-op Plan:   Post-operative Plan:   Informed Consent: I have reviewed the patients  History and Physical, chart, labs and discussed the procedure including the risks, benefits and alternatives for the proposed anesthesia with the patient or authorized representative who has indicated his/her understanding and acceptance.     Dental advisory given  Plan Discussed with: CRNA  Anesthesia Plan Comments: (Plan Full PPE use  Plan GA with GETA as needed d/w pt -WTP with same after Q&A)        Anesthesia Quick Evaluation

## 2019-10-11 NOTE — Op Note (Signed)
[facility] Patient Name: [name] Procedure Date: 10/11/2019 2:48 PM MRN: [id number] Date of Birth: 03-03-1951 Attending MD: Norvel Richards , MD CSN: [id number] Age: 68 Admit Type: Outpatient Procedure:                Colonoscopy Indications:              High risk colon cancer surveillance: Personal                            history of colon cancer Providers:                Norvel Richards, MD, Otis Peak B. Sharon Seller, RN,                            Nelma Rothman, Technician Referring MD:              Medicines:                Propofol per Anesthesia Complications:            No immediate complications. Estimated Blood Loss:     Estimated blood loss was minimal. Procedure:                Pre-Anesthesia Assessment:                           - Prior to the procedure, a History and Physical                            was performed, and patient medications and                            allergies were reviewed. The patient's tolerance of                            previous anesthesia was also reviewed. The risks                            and benefits of the procedure and the sedation                            options and risks were discussed with the patient.                            All questions were answered, and informed consent                            was obtained. Prior Anticoagulants: The patient has                            taken no previous anticoagulant or antiplatelet                            agents. ASA Grade Assessment: II - A patient with  mild systemic disease. After reviewing the risks                            and benefits, the patient was deemed in                            satisfactory condition to undergo the procedure.                           After obtaining informed consent, the colonoscope                            was passed under direct vision. Throughout the                            procedure, the  patient's blood pressure, pulse, and                            oxygen saturations were monitored continuously. The                            [id number] scope was introduced through                            the anus and advanced to the the ileocolonic                            anastomosis. The colonoscopy was performed without                            difficulty. The patient tolerated the procedure                            well. The quality of the bowel preparation was                            adequate. Scope In: 3:11:12 PM Scope Out: 3:27:20 PM Scope Withdrawal Time: 0 hours 7 minutes 21 seconds  Total Procedure Duration: 0 hours 16 minutes 8 seconds  Findings:      Scattered diverticula were found in the sigmoid colon and descending       colon. Anastomosis with small bowel readily identified. Just above the       anastomosis there were multiple areas of ulceration clustered together       within a couple of centimeters area. Please see photos. 1 5 mm ulcer       excision was located 5 cm up into the distal neoterminal ileum.       Otherwise the terminal ileum appeared normal to 15 cm. Biopsies taken. Impression:               - Diverticulosis in the sigmoid colon and in the                            descending colon. Focal ulcerations distal  neoterminal ileum uncertain significance                           -Status post biopsy of the neoterminal ileum Moderate Sedation:      Moderate (conscious) sedation was personally administered by an       anesthesia professional. The following parameters were monitored: oxygen       saturation, heart rate, blood pressure, respiratory rate, EKG, adequacy       of pulmonary ventilation, and response to care. Recommendation:           - Patient has a contact number available for                            emergencies. The signs and symptoms of potential                            delayed complications  were discussed with the                            patient. Return to normal activities tomorrow.                            Written discharge instructions were provided to the                            patient.                           - Advance diet as tolerated.                           - Continue present medications.                           - Repeat colonoscopy in 5 years for surveillance.                           - Return to GI office (date not yet determined). Procedure Code(s):        --- Professional ---                           [phone], Colonoscopy, flexible; diagnostic, including                            collection of specimen(s) by brushing or washing,                            when performed (separate procedure) Diagnosis Code(s):        --- Professional ---                           [id number], Personal history of other malignant                            neoplasm of large intestine  K57.30, Diverticulosis of large intestine without                            perforation or abscess without bleeding CPT copyright 2019 American Medical Association. All rights reserved. The codes documented in this report are preliminary and upon coder review may  be revised to meet current compliance requirements. Cristopher Estimable. [name], MD Norvel Richards, MD 10/11/2019 4:35:43 PM This report has been signed electronically. Number of Addenda: 0

## 2019-10-11 NOTE — Discharge Instructions (Signed)
Colonoscopy Discharge Instructions  Read the instructions outlined below and refer to this sheet in the next few weeks. These discharge instructions provide you with general information on caring for yourself after you leave the hospital. Your doctor may also give you specific instructions. While your treatment has been planned according to the most current medical practices available, unavoidable complications occasionally occur. If you have any problems or questions after discharge, call Dr. Gala Romney at [phone]. ACTIVITY  You may resume your regular activity, but move at a slower pace for the next 24 hours.   Take frequent rest periods for the next 24 hours.   Walking will help get rid of the air and reduce the bloated feeling in your belly (abdomen).   No driving for 24 hours (because of the medicine (anesthesia) used during the test).    Do not sign any important legal documents or operate any machinery for 24 hours (because of the anesthesia used during the test).  NUTRITION  Drink plenty of fluids.   You may resume your normal diet as instructed by your doctor.   Begin with a light meal and progress to your normal diet. Heavy or fried foods are harder to digest and may make you feel sick to your stomach (nauseated).   Avoid alcoholic beverages for 24 hours or as instructed.  MEDICATIONS  You may resume your normal medications unless your doctor tells you otherwise.  WHAT YOU CAN EXPECT TODAY  Some feelings of bloating in the abdomen.   Passage of more gas than usual.   Spotting of blood in your stool or on the toilet paper.  IF YOU HAD POLYPS REMOVED DURING THE COLONOSCOPY:  No aspirin products for 7 days or as instructed.   No alcohol for 7 days or as instructed.   Eat a soft diet for the next 24 hours.  FINDING OUT THE RESULTS OF YOUR TEST Not all test results are available during your visit. If your test results are not back during the visit, make an appointment  with your caregiver to find out the results. Do not assume everything is normal if you have not heard from your caregiver or the medical facility. It is important for you to follow up on all of your test results.  SEEK IMMEDIATE MEDICAL ATTENTION IF:  You have more than a spotting of blood in your stool.   Your belly is swollen (abdominal distention).   You are nauseated or vomiting.   You have a temperature over 101.   You have abdominal pain or discomfort that is severe or gets worse throughout the day.    Further recommendations to follow pending review of pathology report  At patient's request I spoke to Caryl Asp, his wife at [place], Care After These instructions provide you with information about caring for yourself after your procedure. Your health care provider may also give you more specific instructions. Your treatment has been planned according to current medical practices, but problems sometimes occur. Call your health care provider if you have any problems or questions after your procedure. What can I expect after the procedure? After your procedure, you may:  Feel sleepy for several hours.  Feel clumsy and have poor balance for several hours.  Feel forgetful about what happened after the procedure.  Have poor judgment for several hours.  Feel nauseous or vomit.  Have a sore throat if you had a breathing tube during the procedure. Follow these instructions  at home: For at least 24 hours after the procedure:      Have a responsible adult stay with you. It is important to have someone help care for you until you are awake and alert.  Rest as needed.  Do not: ? Participate in activities in which you could fall or become injured. ? Drive. ? Use heavy machinery. ? Drink alcohol. ? Take sleeping pills or medicines that cause drowsiness. ? Make important decisions or sign legal documents. ? Take care of children on your  own. Eating and drinking  Follow the diet that is recommended by your health care provider.  If you vomit, drink water, juice, or soup when you can drink without vomiting.  Make sure you have little or no nausea before eating solid foods. General instructions  Take over-the-counter and prescription medicines only as told by your health care provider.  If you have sleep apnea, surgery and certain medicines can increase your risk for breathing problems. Follow instructions from your health care provider about wearing your sleep device: ? Anytime you are sleeping, including during daytime naps. ? While taking prescription pain medicines, sleeping medicines, or medicines that make you drowsy.  If you smoke, do not smoke without supervision.  Keep all follow-up visits as told by your health care provider. This is important. Contact a health care provider if:  You keep feeling nauseous or you keep vomiting.  You feel light-headed.  You develop a rash.  You have a fever. Get help right away if:  You have trouble breathing. Summary  For several hours after your procedure, you may feel sleepy and have poor judgment.  Have a responsible adult stay with you for at least 24 hours or until you are awake and alert. This information is not intended to replace advice given to you by your health care provider. Make sure you discuss any questions you have with your health care provider. Document Released: 03/28/2016 Document Revised: 03/06/2018 Document Reviewed: 03/28/2016 Elsevier Patient Education  2020 [employer].

## 2019-10-11 NOTE — Transfer of Care (Signed)
Immediate Anesthesia Transfer of Care Note  Patient: [name]  Procedure(s) Performed: COLONOSCOPY WITH PROPOFOL (N/A ) BIOPSY  Patient Location: PACU  Anesthesia Type:MAC  Level of Consciousness: awake, alert  and oriented  Airway & Oxygen Therapy: Patient Spontanous Breathing  Post-op Assessment: Report given to RN, Post -op Vital signs reviewed and stable and Patient moving all extremities X 4  Post vital signs: Reviewed and stable  Last Vitals:  Vitals Value Taken Time  BP    Temp    Pulse 54 10/11/19 1533  Resp 11 10/11/19 1533  SpO2 99 % 10/11/19 1533  Vitals shown include unvalidated device data.  Last Pain:  Vitals:   10/11/19 1503  TempSrc:   PainSc: 0-No pain         Complications: No apparent anesthesia complications

## 2019-10-15 LAB — SURGICAL PATHOLOGY

## 2019-10-17 ENCOUNTER — Encounter (HOSPITAL_COMMUNITY): Payer: Self-pay | Admitting: Internal Medicine

## 2019-10-17 ENCOUNTER — Encounter: Payer: Self-pay | Admitting: Internal Medicine

## 2019-10-17 ENCOUNTER — Telehealth: Payer: Self-pay

## 2019-10-17 NOTE — Telephone Encounter (Signed)
Opened in error

## 2019-10-18 ENCOUNTER — Telehealth: Payer: Self-pay

## 2019-10-18 ENCOUNTER — Other Ambulatory Visit: Payer: Self-pay

## 2019-10-18 DIAGNOSIS — K289 Gastrojejunal ulcer, unspecified as acute or chronic, without hemorrhage or perforation: Secondary | ICD-10-CM

## 2019-10-18 NOTE — Addendum Note (Signed)
Addended by: Zara Council C on: 10/18/2019 12:59 PM   Modules accepted: Orders

## 2019-10-18 NOTE — Telephone Encounter (Signed)
Per RMR- Send letter to patient.  Send copy of letter with path to referring provider and PCP.   Needs a capsule study of small bowel - small bowel ulcers seen on TCS. NEEDS A PATENCY CAPSULE FIRST.

## 2019-10-18 NOTE — Telephone Encounter (Signed)
done

## 2019-10-18 NOTE — Telephone Encounter (Signed)
Called pt, agile capsule study scheduled for 10/29/19 at 7:30am. Pt to arrive at 7:00am. Instructions mailed. Orders entered. Order for DG abd 1 view entered.

## 2019-10-24 ENCOUNTER — Encounter: Payer: Self-pay | Admitting: Psychiatry

## 2019-10-24 ENCOUNTER — Ambulatory Visit (INDEPENDENT_AMBULATORY_CARE_PROVIDER_SITE_OTHER): Payer: Medicare Other | Admitting: Psychiatry

## 2019-10-24 ENCOUNTER — Other Ambulatory Visit: Payer: Self-pay

## 2019-10-24 DIAGNOSIS — F331 Major depressive disorder, recurrent, moderate: Secondary | ICD-10-CM

## 2019-10-24 DIAGNOSIS — F341 Dysthymic disorder: Secondary | ICD-10-CM | POA: Diagnosis not present

## 2019-10-24 DIAGNOSIS — F902 Attention-deficit hyperactivity disorder, combined type: Secondary | ICD-10-CM | POA: Diagnosis not present

## 2019-10-24 MED ORDER — AMPHETAMINE-DEXTROAMPHETAMINE 10 MG PO TABS: 10.0000 mg | ORAL_TABLET | Freq: Two times a day (BID) | ORAL | 0 refills | Status: DC

## 2019-10-24 NOTE — Progress Notes (Signed)
Robert WIEDERHOLT [id number] Jan 19, 1951 69 y.o.     Subjective:   Patient ID:  [name] is a 68 y.o. (DOB [birth year]) male.  Chief Complaint:  Chief Complaint  Patient presents with  . Follow-up    Medication Management  . Depression    Medication Management  . Other    Bipolar    Depression        Associated symptoms include no decreased concentration and no suicidal ideas.  [name] presents for follow-up of depression and medication changes.  At visit February 12, 2019.  He had become more depressed off of the [name] and it was recommended he restart [name] 1.5 mg daily.  He also had previously felt edgy and so we reduced the Wellbutrin from 450 to 300 mg daily in hopes of cutting down on that edgy feeling. He felt edgier after reducing the Wellbutrin to 300 so increased it back.    Last visit May 2020.  [name] was increased to 1.5 mg daily to try to help more consistently with depression and anxiety.   Not really benefit he noticed with [name] but not sure.    Distracted big time.  Mind wanders.  Nothing is changed at home.  Has talked with wife about it.  They are both their for [name] only.  Unhappy with marriage.  I hate to go out of the world like this.   Nothing I can do about it.  Lost 50# and feels good about it.  Has walked a lot and cut back eating.  Step dad died and helps with mother.    Has been compliant.  I stay stressed out all the time.    Still worries all the time.  Dwells on negative things.  Chronic conflict with wife especially over their disabled son [name]  1 cup coffee and a drink a day.  Trazodone still helps sleep.    Sleep better with trazodone. Pt reports that mood is Anxious, Depressed, Dysphoric and Irritable and describes anxiety as Moderate. Anxiety symptoms include: Excessive Worry,. Pt reports no sleep issues. Pt reports that appetite is good. Pt reports that energy is lethargic and down slightly and loss of interest or  pleasure in usual activities. Concentration is difficulty with focus and attention. Suicidal thoughts:  denied by patient.  Brain doesn't shut off.  Somatic worry despite normal PE lately.  Hard to push himself into hobbies too.  Unselttl;ed.  Bored in retirement and lonely at home.  W preoccupied and consumed with developmentally disabled son [name] and ignores pt.  Even harder time keeping [name] and managing his emotions about it.  Some rumination and guilt on his thoughts.  Past Psychiatric Medication Trials: [name] 1.5, trazodone, Abilify side effects in 2012, risperidone, Pristiq, paroxetine, , lithium, amitriptyline, lamotrigine, Ambien cognitive side effects, Lunesta cognitive side effects, venlafaxine,  nortriptyline, duloxetine, Xanax, buspirone Evekeo, Nuvigil, Concerta,   Review of Systems:  Review of Systems  Musculoskeletal: Positive for back pain.  Neurological: Negative for tremors and weakness.  Psychiatric/Behavioral: Positive for depression and dysphoric mood. Negative for agitation, behavioral problems, confusion, decreased concentration, hallucinations, self-injury, sleep disturbance and suicidal ideas. The patient is nervous/anxious. The patient is not hyperactive.     Medications: I have reviewed the patient's current medications.  Current Outpatient Medications  Medication Sig Dispense Refill  . brimonidine (ALPHAGAN) 0.2 % ophthalmic solution Place 1 drop into both eyes 2 (two) times daily.     Marland Kitchen buPROPion (WELLBUTRIN XL) 150 MG  24 hr tablet Take 3 tablets (450 mg total) by mouth every morning. 270 tablet 3  . docusate sodium (COLACE) 100 MG capsule Take 1 capsule (100 mg total) by mouth 2 (two) times daily as needed. 60 capsule 2  . dorzolamide-timolol (COSOPT) 22.3-6.8 MG/ML ophthalmic solution Place 1 drop into both eyes 2 (two) times daily.    . fluticasone (FLONASE) 50 MCG/ACT nasal spray Place 2 sprays into both nostrils daily as needed for allergies.   2  .  lamoTRIgine (LAMICTAL) 200 MG tablet Take 1 tablet (200 mg total) by mouth daily. 90 tablet 3  . latanoprost (XALATAN) 0.005 % ophthalmic solution Place 1 drop into both eyes at bedtime.    . pantoprazole (PROTONIX) 40 MG tablet Take 1 tablet (40 mg total) by mouth 2 (two) times daily before a meal. 60 tablet 5  . polyethylene glycol (MIRALAX / GLYCOLAX) 17 g packet Take 17 g by mouth daily.    . traZODone (DESYREL) 50 MG tablet Take 1-2 tablets (50-100 mg total) by mouth at bedtime as needed for sleep (insomnia). 180 tablet 3  . vitamin B-12 (CYANOCOBALAMIN) 500 MCG tablet Take 500 mcg every evening by mouth.    Arman Filter capsule TAKE 1 CAPSULE(1.5 MG) BY MOUTH DAILY (Patient taking differently: Take 1.5 mg by mouth daily. ) 30 capsule 1   No current facility-administered medications for this visit.     Medication Side Effects: None  Allergies:  Allergies  Allergen Reactions  . Ambien [Zolpidem Tartrate]     Sleep walks  . Bactrim [Sulfamethoxazole-Trimethoprim] Hives and Swelling  . Dilaudid [Hydromorphone Hcl] Other (See Comments)    Made blood pressure drop with colon resection surgery in 2007   . Morphine And Related Nausea And Vomiting    Past Medical History:  Diagnosis Date  . Anemia    hx of   . Arthritis    fingers   . BPH (benign prostatic hyperplasia)   . Colon cancer ([place])    stage II, diagnosed 2007  . Complication of anesthesia    vagal response after umbilcal hernia at [facility]  . Depression   . Diabetes mellitus    no as of 09/08/16 due to weight loss   . GERD (gastroesophageal reflux disease)   . Glaucoma 2012  . History of kidney stones   . Sleep apnea    wears CiPaP at night  . Staph infection 2008   left side of face    Family History  Problem Relation Age of Onset  . Lung cancer Father   . Diabetes Mother   . Anesthesia problems Neg Hx   . Hypotension Neg Hx   . Malignant hyperthermia Neg Hx   . Pseudochol deficiency Neg Hx     Social  History   Socioeconomic History  . Marital status: Married    Spouse name: Not on file  . Number of children: 1  . Years of education: Not on file  . Highest education level: Not on file  Occupational History  . Occupation: school system    Employer: [employer]  . Financial resource strain: Not on file  . Food insecurity    Worry: Not on file    Inability: Not on file  . Transportation needs    Medical: Not on file    Non-medical: Not on file  Tobacco Use  . Smoking status: Never Smoker  . Smokeless tobacco: Never Used  . Tobacco comment: Never smoked  Substance and Sexual Activity  . Alcohol use: No    Alcohol/week: 0.0 standard drinks  . Drug use: No  . Sexual activity: Yes    Birth control/protection: None  Lifestyle  . Physical activity    Days per week: Not on file    Minutes per session: Not on file  . Stress: Not on file  Relationships  . Social Herbalist on phone: Not on file    Gets together: Not on file    Attends religious service: Not on file    Active member of club or organization: Not on file    Attends meetings of clubs or organizations: Not on file    Relationship status: Not on file  . Intimate partner violence    Fear of current or ex partner: Not on file    Emotionally abused: Not on file    Physically abused: Not on file    Forced sexual activity: Not on file  Other Topics Concern  . Not on file  Social History Narrative  . Not on file    Past Medical History, Surgical history, Social history, and Family history were reviewed and updated as appropriate.   Please see review of systems for further details on the patient's review from today.   Objective:   Physical Exam:  There were no vitals taken for this visit.  Physical Exam Constitutional:      General: He is not in acute distress.    Appearance: He is well-developed.  Musculoskeletal:        General: No deformity.  Neurological:     Mental  Status: He is alert and oriented to person, place, and time.     Cranial Nerves: No dysarthria.     Coordination: Coordination normal.  Psychiatric:        Attention and Perception: Perception normal. He is inattentive. He does not perceive auditory or visual hallucinations.        Mood and Affect: Mood is depressed. Mood is not anxious. Affect is not labile, blunt, angry or inappropriate.        Speech: Speech normal.        Behavior: Behavior normal. Behavior is cooperative.        Thought Content: Thought content normal. Thought content is not paranoid or delusional. Thought content does not include homicidal or suicidal ideation. Thought content does not include homicidal or suicidal plan.        Cognition and Memory: Cognition and memory normal.        Judgment: Judgment normal.     Comments: Chronic dysthymia is unchanged.  He is not particularly anxious this time.     Lab Review:     Component Value Date/Time   NA 139 08/13/2019 1143   K 4.4 08/13/2019 1143   CL 103 08/13/2019 1143   CO2 28 08/13/2019 1143   GLUCOSE 89 08/13/2019 1143   BUN 14 08/13/2019 1143   CREATININE 0.99 08/13/2019 1143   CALCIUM 9.1 08/13/2019 1143   PROT 6.4 (L) 08/13/2019 1143   ALBUMIN 3.7 08/13/2019 1143   AST 17 08/13/2019 1143   ALT 13 08/13/2019 1143   ALKPHOS 79 08/13/2019 1143   BILITOT 0.7 08/13/2019 1143   GFRNONAA >60 08/13/2019 1143   GFRAA >60 08/13/2019 1143       Component Value Date/Time   WBC 4.6 08/13/2019 1143   RBC 4.76 08/13/2019 1143   HGB 14.1 08/13/2019 1143   HCT 42.6 08/13/2019 1143  PLT 149 (L) 08/13/2019 1143   MCV 89.5 08/13/2019 1143   MCH 29.6 08/13/2019 1143   MCHC 33.1 08/13/2019 1143   RDW 12.7 08/13/2019 1143   LYMPHSABS 0.7 07/31/2019 0235   MONOABS 0.9 07/31/2019 0235   EOSABS 0.0 07/31/2019 0235   BASOSABS 0.0 07/31/2019 0235    No results found for: POCLITH, LITHIUM   No results found for: PHENYTOIN, PHENOBARB, VALPROATE, CBMZ   Normal  stress test.  No exercise.  Won't stick with diet. 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  He has failed multiple medications listed above.  He decided that the [name] was not particularly helpful.  Supportive therapy on problems with home life.  Trying to motivate for the hobbies.    No benefit to [name] so DC it.   Call back if any symptoms are worse.  It is worth noting that he was worse with irritability and edginess after reducing the Wellbutrin to 300 mg daily and better at 450 mg daily so we will continue Wellbutrin 450 mg daily.  Chronic attention problems remain and he is failed multiple ADD meds including off label things such as modafinil and Nuvigil  Never tried Adderall.  BC  Inattentive driving it is worth a trial.   Adderall 10 mg 1/2 BID for a few days, then 10 mg BID.  This appt was 30 mins.  FU 3 mos  Lynder Parents, MD, DFAPA   Please see After Visit Summary for patient specific instructions.  No future appointments.  No orders of the defined types were placed in this encounter.     -------------------------------

## 2019-10-29 ENCOUNTER — Ambulatory Visit (HOSPITAL_COMMUNITY)
Admission: RE | Admit: 2019-10-29 | Discharge: 2019-10-29 | Disposition: A | Discharge status: Home / Self Care | Payer: Medicare Other | Attending: Internal Medicine | Admitting: Internal Medicine

## 2019-10-29 ENCOUNTER — Encounter (HOSPITAL_COMMUNITY)
Admission: RE | Disposition: A | Discharge status: Home / Self Care | Payer: Self-pay | Source: Home / Self Care | Attending: Internal Medicine

## 2019-10-29 DIAGNOSIS — D649 Anemia, unspecified: Secondary | ICD-10-CM | POA: Diagnosis not present

## 2019-10-29 HISTORY — PX: AGILE CAPSULE: SHX5420

## 2019-10-29 SURGERY — AGILE CAPSULE

## 2019-10-30 ENCOUNTER — Ambulatory Visit (HOSPITAL_COMMUNITY)
Admission: RE | Admit: 2019-10-30 | Discharge: 2019-10-30 | Disposition: A | Discharge status: Home / Self Care | Payer: Medicare Other | Source: Ambulatory Visit | Attending: Internal Medicine | Admitting: Internal Medicine

## 2019-10-30 ENCOUNTER — Other Ambulatory Visit: Payer: Self-pay

## 2019-10-30 DIAGNOSIS — K289 Gastrojejunal ulcer, unspecified as acute or chronic, without hemorrhage or perforation: Secondary | ICD-10-CM | POA: Insufficient documentation

## 2019-10-31 ENCOUNTER — Other Ambulatory Visit: Payer: Self-pay

## 2019-10-31 ENCOUNTER — Telehealth: Payer: Self-pay | Admitting: Internal Medicine

## 2019-10-31 ENCOUNTER — Encounter (HOSPITAL_COMMUNITY): Payer: Self-pay | Admitting: Internal Medicine

## 2019-10-31 DIAGNOSIS — D649 Anemia, unspecified: Secondary | ICD-10-CM

## 2019-10-31 NOTE — Telephone Encounter (Signed)
PATIENT WIFE CALLED TO LET YOU KNOW HE LEFT HIS CELL PHONE AT HOME AND HE WILL CALL HERE TO SPEAK TO YOU WHEN HE IS OFF

## 2019-10-31 NOTE — Telephone Encounter (Signed)
Noted  

## 2019-11-12 ENCOUNTER — Encounter (HOSPITAL_COMMUNITY)
Admission: RE | Disposition: A | Discharge status: Home / Self Care | Payer: Self-pay | Source: Home / Self Care | Attending: Internal Medicine

## 2019-11-12 ENCOUNTER — Encounter (HOSPITAL_COMMUNITY): Payer: Self-pay | Admitting: *Deleted

## 2019-11-12 ENCOUNTER — Ambulatory Visit (HOSPITAL_COMMUNITY)
Admission: RE | Admit: 2019-11-12 | Discharge: 2019-11-12 | Disposition: A | Discharge status: Home / Self Care | Payer: Medicare Other | Attending: Internal Medicine | Admitting: Internal Medicine

## 2019-11-12 DIAGNOSIS — K633 Ulcer of intestine: Secondary | ICD-10-CM | POA: Diagnosis not present

## 2019-11-12 DIAGNOSIS — D649 Anemia, unspecified: Secondary | ICD-10-CM

## 2019-11-12 HISTORY — PX: GIVENS CAPSULE STUDY: SHX5432

## 2019-11-12 SURGERY — IMAGING PROCEDURE, GI TRACT, INTRALUMINAL, VIA CAPSULE

## 2019-11-12 MED ORDER — SODIUM CHLORIDE 0.9 % IV SOLN: INTRAVENOUS | Status: DC

## 2019-11-14 ENCOUNTER — Encounter (HOSPITAL_COMMUNITY): Payer: Self-pay | Admitting: Internal Medicine

## 2019-11-14 NOTE — Op Note (Addendum)
Small Bowel Givens Capsule Study Procedure date:  11/12/19  Referring Provider:  Dr. Gala Romney PCP:  Dr. Rory Percy  Indication for procedure:  Anastomotic ulcer on colonoscopy, evaluation for Crohn's Disease.  Patient data:  Wt: 185 lbs 6.4 oz Ht: 5'9" Waist: N/A  Findings: Complete study. No active bleeding found. One area of questionable appearance at 01:36:54. Multiple areas of erosions, possible ulcers, possible lymphangiectasias, possible AVMs. No obvious masses. See images for details.   First Gastric image:  00:00:59 First Duodenal image: 01:31:53 First Ileo-Cecal Valve image: N/A First Cecal image: 4:43:45 Gastric Passage time: 1h 59m Small Bowel Passage time:  3h 23m  Summary & Recommendations: Multiple areas with possible small bowel erosions and ulcers without active bleeding. Further work up for possible Crohn's justified. Consider inflammatory markers including CRP and sed rate. Follow-up in GI office.   Attending note: Images reviewed.  Agree there are scattered erosions throughout the small bowel.  Again, area of ulceration seen at the anastomosis  At 4: 4 3: 17 into study.  This may be ischemia.  NSAIDs are a possibility but patient denies.  Otherwise, nonspecific.  Could be early inflammatory bowel disease but findings rather subtle  We will see him back in 6 months and decide if further evaluation is warranted.

## 2019-11-28 ENCOUNTER — Telehealth: Payer: Self-pay | Admitting: Gastroenterology

## 2019-11-28 ENCOUNTER — Encounter: Payer: Self-pay | Admitting: Internal Medicine

## 2019-11-28 DIAGNOSIS — K639 Disease of intestine, unspecified: Secondary | ICD-10-CM

## 2019-11-28 NOTE — Telephone Encounter (Signed)
PATIENT SCHEDULED FOR FOLLOW UP WITH YOU AND LETTER SENT

## 2019-11-28 NOTE — Telephone Encounter (Signed)
Discussed Givens Capsule Study results with patient. He denies any regular NSAID use. States occasionally he will take an Advil, but not often. He was advised to avoid all NSAIDs. There was a lesion identified in the small bowel that needs further evaluation. Unclear if this is arising from the lumen of small bowel or something extraluminal pushing on wall of small bowel. Spoke with Dr. Nyoka Cowden (radiologist) about best imaging modality. He recommended CT enterography so we can evaluate intraluminal and extraluminal causes. Patient agrees with pursuing CT enterography. He will also need office visit in 6 months.   [facility] Clinical Pool: Can you arrange CT enterography? Dx: Small bowel lesion  Stacey: Please arrange office visit with me in 6 months.

## 2019-11-28 NOTE — Telephone Encounter (Signed)
No PA needed for CT entero per [facility] website.  CT entero abd/pelvis w/contrast scheduled for 12/18/19 at 5:00pm, arrrive at 3:15pm. Needs updated creatinine.  Tried to call pt, no answer, LMOVM for return call.

## 2019-11-28 NOTE — Addendum Note (Signed)
Addended by: Hassan Rowan on: 11/28/2019 02:41 PM   Modules accepted: Orders

## 2019-11-29 NOTE — Telephone Encounter (Addendum)
Tried to call pt, no answer, left detailed message on VM to inform of appt. Letter mailed with lab order.

## 2019-11-29 NOTE — Addendum Note (Signed)
Addended by: Zara Council C on: 11/29/2019 10:00 AM   Modules accepted: Orders

## 2019-12-06 ENCOUNTER — Other Ambulatory Visit: Payer: Self-pay | Admitting: General Surgery

## 2019-12-11 LAB — CREATININE, SERUM: Creat: 0.9 mg/dL (ref 0.70–1.25)

## 2019-12-18 ENCOUNTER — Ambulatory Visit (HOSPITAL_COMMUNITY)
Admission: RE | Admit: 2019-12-18 | Discharge: 2019-12-18 | Disposition: A | Discharge status: Home / Self Care | Payer: Medicare Other | Source: Ambulatory Visit | Attending: Gastroenterology | Admitting: Gastroenterology

## 2019-12-18 ENCOUNTER — Other Ambulatory Visit: Payer: Self-pay

## 2019-12-18 ENCOUNTER — Telehealth: Payer: Self-pay | Admitting: Gastroenterology

## 2019-12-18 DIAGNOSIS — K639 Disease of intestine, unspecified: Secondary | ICD-10-CM

## 2019-12-18 NOTE — Telephone Encounter (Signed)
[name]. Received message from Gaetana Michaelis stating patient showed up today for CT entero at 4:30 for his 5pm appointment. Was supposed to be there at 3:15 for labs and to start drinking the Volumen (not the Readi Cat). Per Anderson Malta, he was told by the office to be there at 3:15 but then was sent a letter by the office that stated to pick up oral contrast the day before exam and to arrive at 4:45.  I am not sure what letter he received. The one I seen in our system sent on 12/10 states to be there at 3:15pm. Either way, she said he is rescheduled for Monday, January 4th. Not sure if we need to do anything else regarding this.

## 2019-12-19 ENCOUNTER — Telehealth: Payer: Self-pay

## 2019-12-19 NOTE — Telephone Encounter (Signed)
Morley at pre-service center called office, pt's insurance will change to [facility] 12/21/19. CT entero abd/pelvis scheduled for 12/24/19 will need PA from [facility]. [employer] ID# [id number]. [phone] [id number].   Unable to submit PA via [employer] website d/t member not found. [facility], spoke to [employer], unable to submit PA at this time d/t plan takes effect 12/21/19. PA will have to be submitted after 12/21/19.   Called and informed pt's wife CT for 12/24/19 will have to be cancelled. Will reschedule after PA is submitted and approved by [facility].  [employer], CT cancelled. Called and informed [place].

## 2019-12-19 NOTE — Telephone Encounter (Signed)
Noted, pt was told same instructions as letter that was mailed.

## 2019-12-24 ENCOUNTER — Encounter (HOSPITAL_COMMUNITY): Payer: Self-pay

## 2019-12-24 ENCOUNTER — Ambulatory Visit (HOSPITAL_COMMUNITY): Payer: Medicare HMO

## 2019-12-24 NOTE — Telephone Encounter (Signed)
PA for CT entero abd/pelvis w/contrast submitted via [employer] website. Case went to clinical review. Clinical notes faxed to [facility]. [facility] Tracking# [id number].

## 2019-12-24 NOTE — Telephone Encounter (Signed)
[name] at [facility] called office requesting additional clinical notes. Last 2 OV notes and recent CT results attached to previous sent notes. Notes faxed to [employer].

## 2019-12-25 ENCOUNTER — Telehealth: Payer: Self-pay

## 2019-12-25 NOTE — Telephone Encounter (Signed)
Renewal for PA for [name] 1.5 mg approved but patient did discontinue 10/2019.

## 2019-12-26 NOTE — Telephone Encounter (Signed)
CT approved. [employer]# [id number], valid 12/24/19-01/23/20.

## 2019-12-26 NOTE — Telephone Encounter (Signed)
CT entero scheduled for 01/10/20 at 4:00pm, arrive at 2:30pm. NPO 4 hours prior to test.   Called and informed pt of appt. Letter mailed.

## 2020-01-10 ENCOUNTER — Other Ambulatory Visit: Payer: Self-pay

## 2020-01-10 ENCOUNTER — Ambulatory Visit (HOSPITAL_COMMUNITY)
Admission: RE | Admit: 2020-01-10 | Discharge: 2020-01-10 | Disposition: A | Discharge status: Home / Self Care | Payer: Medicare PPO | Source: Ambulatory Visit | Attending: Gastroenterology | Admitting: Gastroenterology

## 2020-01-10 DIAGNOSIS — K639 Disease of intestine, unspecified: Secondary | ICD-10-CM | POA: Diagnosis not present

## 2020-01-10 DIAGNOSIS — K6389 Other specified diseases of intestine: Secondary | ICD-10-CM | POA: Diagnosis not present

## 2020-01-10 DIAGNOSIS — K5710 Diverticulosis of small intestine without perforation or abscess without bleeding: Secondary | ICD-10-CM | POA: Diagnosis not present

## 2020-01-10 MED ORDER — IOHEXOL 300 MG/ML  SOLN
125.0000 mL | Freq: Once | INTRAMUSCULAR | Status: AC | PRN
  Administered 2020-01-10: 125 mL via INTRAVENOUS

## 2020-01-24 ENCOUNTER — Ambulatory Visit: Payer: Medicare Other | Admitting: Psychiatry

## 2020-01-24 DIAGNOSIS — R739 Hyperglycemia, unspecified: Secondary | ICD-10-CM | POA: Diagnosis not present

## 2020-01-24 DIAGNOSIS — I10 Essential (primary) hypertension: Secondary | ICD-10-CM | POA: Diagnosis not present

## 2020-02-06 ENCOUNTER — Encounter: Payer: Self-pay | Admitting: Psychiatry

## 2020-02-06 ENCOUNTER — Ambulatory Visit (INDEPENDENT_AMBULATORY_CARE_PROVIDER_SITE_OTHER): Payer: Medicare PPO | Admitting: Psychiatry

## 2020-02-06 ENCOUNTER — Other Ambulatory Visit: Payer: Self-pay

## 2020-02-06 DIAGNOSIS — F331 Major depressive disorder, recurrent, moderate: Secondary | ICD-10-CM

## 2020-02-06 DIAGNOSIS — F411 Generalized anxiety disorder: Secondary | ICD-10-CM | POA: Diagnosis not present

## 2020-02-06 DIAGNOSIS — F341 Dysthymic disorder: Secondary | ICD-10-CM | POA: Diagnosis not present

## 2020-02-06 MED ORDER — SERTRALINE HCL 50 MG PO TABS: ORAL_TABLET | ORAL | 1 refills | Status: DC

## 2020-02-06 MED ORDER — ALPRAZOLAM 0.5 MG PO TABS: 0.5000 mg | ORAL_TABLET | Freq: Two times a day (BID) | ORAL | 1 refills | Status: DC | PRN

## 2020-02-06 NOTE — Progress Notes (Signed)
ALIUS LAURANCE [id number] 02/07/1951 69 y.o.     Subjective:   Patient ID:  LOCHLANN RAW is a 69 y.o. (DOB 24-Feb-1951) male.  Chief Complaint:  Chief Complaint  Patient presents with  . Depression  . Anxiety  . ADD    Depression        Associated symptoms include no decreased concentration and no suicidal ideas.  [name] presents for follow-up of depression and medication changes.  At visit February 12, 2019.  He had become more depressed off of the [name] and it was recommended he restart [name] 1.5 mg daily.  He also had previously felt edgy and so we reduced the Wellbutrin from 450 to 300 mg daily in hopes of cutting down on that edgy feeling. He felt edgier after reducing the Wellbutrin to 300 so increased it back.    Last visit May 2020.  [name] was increased to 1.5 mg daily to try to help more consistently with depression and anxiety.   Last visit October 24, 2019.  [name] was stopped because it had no beneficial effect.  Adderall was added.  My highs are higher and lows are lower. Started noticing this more since January.   At times more irritable and needs Xanax.  Off Adderall for 2 mos DT NR.  Last 6 weeks had [name] more alone without wife.  Has a good time with [name].  Gets frustrated that wife complains a lot over what he does.  Don't lose my cool anywhere except at home.  Distracted big time.  Mind wanders.  Nothing is changed at home.  Has talked with wife about it.  They are both their for [name] only.  Unhappy with marriage.  I hate to go out of the world like this.   Nothing I can do about it.  Lost 50# and feels good about it.  Has walked a lot and cut back eating.  Step dad died and helps with mother.    Has been compliant.  I stay stressed out all the time.    Still worries all the time.  Dwells on negative things.  Chronic conflict with wife especially over their disabled son [name]  1 cup coffee and a drink a day.  Trazodone still helps sleep.     Sleep better with trazodone. Pt reports that mood is Anxious, Depressed, Dysphoric and Irritable and describes anxiety as Moderate. Anxiety symptoms include: Excessive Worry,. Pt reports no sleep issues. Pt reports that appetite is good. Pt reports that energy is lethargic and down slightly and loss of interest or pleasure in usual activities. Concentration is difficulty with focus and attention. Suicidal thoughts:  denied by patient.  Brain doesn't shut off.  Somatic worry despite normal PE lately.  Hard to push himself into hobbies too.  Unselttl;ed.  Bored in retirement and lonely at home.  W preoccupied and consumed with developmentally disabled son [name] and ignores pt.  Even harder time keeping [name] and managing his emotions about it.  Some rumination and guilt on his thoughts.  Past Psychiatric Medication Trials: [name] 1.5, Abilify side effects in 2012, risperidone,  paroxetine, venlafaxine,  nortriptyline, duloxetine,  Pristiq, amitriptyline,,  lithium, lamotrigine,  Ambien cognitive side effects, Lunesta cognitive side effects, , trazodone,Xanax, buspirone [place], Nuvigil, Concerta,   Review of Systems:  Review of Systems  Musculoskeletal: Positive for back pain.  Neurological: Negative for tremors and weakness.  Psychiatric/Behavioral: Positive for depression and dysphoric mood. Negative for agitation, behavioral problems, confusion, decreased  concentration, hallucinations, self-injury, sleep disturbance and suicidal ideas. The patient is nervous/anxious. The patient is not hyperactive.     Medications: I have reviewed the patient's current medications.  Current Outpatient Medications  Medication Sig Dispense Refill  . brimonidine (ALPHAGAN) 0.2 % ophthalmic solution Place 1 drop into both eyes 2 (two) times daily.     Marland Kitchen buPROPion (WELLBUTRIN XL) 150 MG 24 hr tablet Take 3 tablets (450 mg total) by mouth every morning. 270 tablet 3  . docusate sodium (COLACE) 100 MG  capsule Take 1 capsule (100 mg total) by mouth 2 (two) times daily as needed. 60 capsule 2  . dorzolamide-timolol (COSOPT) 22.3-6.8 MG/ML ophthalmic solution Place 1 drop into both eyes 2 (two) times daily.    . fluticasone (FLONASE) 50 MCG/ACT nasal spray Place 2 sprays into both nostrils daily as needed for allergies.   2  . lamoTRIgine (LAMICTAL) 200 MG tablet Take 1 tablet (200 mg total) by mouth daily. 90 tablet 3  . latanoprost (XALATAN) 0.005 % ophthalmic solution Place 1 drop into both eyes at bedtime.    . pantoprazole (PROTONIX) 40 MG tablet Take 1 tablet (40 mg total) by mouth 2 (two) times daily before a meal. 60 tablet 5  . polyethylene glycol (MIRALAX / GLYCOLAX) 17 g packet Take 17 g by mouth daily.    . traZODone (DESYREL) 50 MG tablet Take 1-2 tablets (50-100 mg total) by mouth at bedtime as needed for sleep (insomnia). 180 tablet 3  . vitamin B-12 (CYANOCOBALAMIN) 500 MCG tablet Take 500 mcg every evening by mouth.    Marland Kitchen amphetamine-dextroamphetamine (ADDERALL) 10 MG tablet Take 1 tablet (10 mg total) by mouth 2 (two) times daily with a meal. (Patient not taking: Reported on 02/06/2020) 60 tablet 0   No current facility-administered medications for this visit.    Medication Side Effects: None  Allergies:  Allergies  Allergen Reactions  . Ambien [Zolpidem Tartrate]     Sleep walks  . Bactrim [Sulfamethoxazole-Trimethoprim] Hives and Swelling  . Dilaudid [Hydromorphone Hcl] Other (See Comments)    Made blood pressure drop with colon resection surgery in 2007   . Morphine And Related Nausea And Vomiting    Past Medical History:  Diagnosis Date  . Anemia    hx of   . Arthritis    fingers   . BPH (benign prostatic hyperplasia)   . Colon cancer ([place])    stage II, diagnosed 2007  . Complication of anesthesia    vagal response after umbilcal hernia at [place]  . Depression   . Diabetes mellitus    no as of 09/08/16 due to weight loss   . GERD (gastroesophageal reflux  disease)   . Glaucoma 2012  . History of kidney stones   . Sleep apnea    wears CiPaP at night  . Staph infection 2008   left side of face    Family History  Problem Relation Age of Onset  . Lung cancer Father   . Diabetes Mother   . Anesthesia problems Neg Hx   . Hypotension Neg Hx   . Malignant hyperthermia Neg Hx   . Pseudochol deficiency Neg Hx     Social History   Socioeconomic History  . Marital status: Married    Spouse name: Not on file  . Number of children: 1  . Years of education: Not on file  . Highest education level: Not on file  Occupational History  . Occupation: school Engineer, manufacturing systems:  [employer]  Tobacco Use  . Smoking status: Never Smoker  . Smokeless tobacco: Never Used  . Tobacco comment: Never smoked  Substance and Sexual Activity  . Alcohol use: No    Alcohol/week: 0.0 standard drinks  . Drug use: No  . Sexual activity: Yes    Birth control/protection: None  Other Topics Concern  . Not on file  Social History Narrative  . Not on file   Social Determinants of Health   Financial Resource Strain:   . Difficulty of Paying Living Expenses: Not on file  Food Insecurity:   . Worried About Charity fundraiser in the Last Year: Not on file  . Ran Out of Food in the Last Year: Not on file  Transportation Needs:   . Lack of Transportation (Medical): Not on file  . Lack of Transportation (Non-Medical): Not on file  Physical Activity:   . Days of Exercise per Week: Not on file  . Minutes of Exercise per Session: Not on file  Stress:   . Feeling of Stress : Not on file  Social Connections:   . Frequency of Communication with Friends and Family: Not on file  . Frequency of Social Gatherings with Friends and Family: Not on file  . Attends Religious Services: Not on file  . Active Member of Clubs or Organizations: Not on file  . Attends Archivist Meetings: Not on file  . Marital Status: Not on file  Intimate Partner  Violence:   . Fear of Current or Ex-Partner: Not on file  . Emotionally Abused: Not on file  . Physically Abused: Not on file  . Sexually Abused: Not on file    Past Medical History, Surgical history, Social history, and Family history were reviewed and updated as appropriate.   Please see review of systems for further details on the patient's review from today.   Objective:   Physical Exam:  There were no vitals taken for this visit.  Physical Exam Constitutional:      General: He is not in acute distress.    Appearance: He is well-developed.  Musculoskeletal:        General: No deformity.  Neurological:     Mental Status: He is alert and oriented to person, place, and time.     Cranial Nerves: No dysarthria.     Coordination: Coordination normal.  Psychiatric:        Attention and Perception: Perception normal. He is inattentive. He does not perceive auditory or visual hallucinations.        Mood and Affect: Mood is depressed. Mood is not anxious. Affect is not labile, blunt, angry or inappropriate.        Speech: Speech normal.        Behavior: Behavior normal. Behavior is cooperative.        Thought Content: Thought content normal. Thought content is not paranoid or delusional. Thought content does not include homicidal or suicidal ideation. Thought content does not include homicidal or suicidal plan.        Cognition and Memory: Cognition and memory normal.        Judgment: Judgment normal.     Comments: Chronic dysthymia is unchanged.  He is not particularly anxious this time.     Lab Review:     Component Value Date/Time   NA 139 08/13/2019 1143   K 4.4 08/13/2019 1143   CL 103 08/13/2019 1143   CO2 28 08/13/2019 1143   GLUCOSE 89  08/13/2019 1143   BUN 14 08/13/2019 1143   CREATININE 0.90 12/11/2019 0832   CALCIUM 9.1 08/13/2019 1143   PROT 6.4 (L) 08/13/2019 1143   ALBUMIN 3.7 08/13/2019 1143   AST 17 08/13/2019 1143   ALT 13 08/13/2019 1143   ALKPHOS 79  08/13/2019 1143   BILITOT 0.7 08/13/2019 1143   GFRNONAA >60 08/13/2019 1143   GFRAA >60 08/13/2019 1143       Component Value Date/Time   WBC 4.6 08/13/2019 1143   RBC 4.76 08/13/2019 1143   HGB 14.1 08/13/2019 1143   HCT 42.6 08/13/2019 1143   PLT 149 (L) 08/13/2019 1143   MCV 89.5 08/13/2019 1143   MCH 29.6 08/13/2019 1143   MCHC 33.1 08/13/2019 1143   RDW 12.7 08/13/2019 1143   LYMPHSABS 0.7 07/31/2019 0235   MONOABS 0.9 07/31/2019 0235   EOSABS 0.0 07/31/2019 0235   BASOSABS 0.0 07/31/2019 0235    No results found for: POCLITH, LITHIUM   No results found for: PHENYTOIN, PHENOBARB, VALPROATE, CBMZ   Normal stress test.  No exercise.  Won't stick with diet. 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  He has failed multiple medications listed above.  He decided that the [name] was not particularly helpful.  Supportive therapy on problems with home life.  Trying to motivate for the hobbies.    More irritable lately ? Related to stopping the [name]. Call back if any symptoms are worse.  Zoloft trial for irritability and anxiety.  25 mg for 7 day then 50 mg daily.  Start sertraline 1/2 tablet daily for 1 week then 1 daily.   If no benefit by the end of the first bottle call the office and we will increase the dose.  It should noticeably help irritability.  Xanax 0.5 mg bid prn anxiety.  It is worth noting that he was worse with irritability and edginess after reducing the Wellbutrin to 300 mg daily and better at 450 mg daily so we will continue Wellbutrin 450 mg daily.  Chronic attention problems remain and he is failed multiple ADD meds including off label things such as modafinil and Nuvigil   This appt was 30 mins.  FU 3 mos  Lynder Parents, MD, DFAPA   Please see After Visit Summary for  patient specific instructions.  Future Appointments  Date Time Provider [place]  05/29/2020 10:00 AM Erenest Rasher, PA-C [facility]    No orders of the defined types were placed in this encounter.     -------------------------------

## 2020-02-06 NOTE — Patient Instructions (Signed)
Start sertraline 1/2 tablet daily for 1 week then 1 daily.   If no benefit by the end of the first bottle call the office and we will increase the dose.  It should noticeably help irritability.

## 2020-02-15 ENCOUNTER — Ambulatory Visit: Payer: Medicare PPO

## 2020-03-19 ENCOUNTER — Other Ambulatory Visit: Payer: Self-pay | Admitting: Psychiatry

## 2020-04-01 ENCOUNTER — Other Ambulatory Visit: Payer: Self-pay | Admitting: Psychiatry

## 2020-04-01 ENCOUNTER — Ambulatory Visit (INDEPENDENT_AMBULATORY_CARE_PROVIDER_SITE_OTHER): Payer: Medicare PPO | Admitting: Psychiatry

## 2020-04-01 ENCOUNTER — Other Ambulatory Visit: Payer: Self-pay

## 2020-04-01 ENCOUNTER — Encounter: Payer: Self-pay | Admitting: Psychiatry

## 2020-04-01 DIAGNOSIS — Z630 Problems in relationship with spouse or partner: Secondary | ICD-10-CM | POA: Diagnosis not present

## 2020-04-01 DIAGNOSIS — F341 Dysthymic disorder: Secondary | ICD-10-CM

## 2020-04-01 DIAGNOSIS — F902 Attention-deficit hyperactivity disorder, combined type: Secondary | ICD-10-CM | POA: Diagnosis not present

## 2020-04-01 DIAGNOSIS — F411 Generalized anxiety disorder: Secondary | ICD-10-CM | POA: Diagnosis not present

## 2020-04-01 DIAGNOSIS — F331 Major depressive disorder, recurrent, moderate: Secondary | ICD-10-CM

## 2020-04-01 MED ORDER — AMPHETAMINE-DEXTROAMPHETAMINE 10 MG PO TABS: 15.0000 mg | ORAL_TABLET | Freq: Two times a day (BID) | ORAL | 0 refills | Status: DC

## 2020-04-01 MED ORDER — SERTRALINE HCL 100 MG PO TABS: ORAL_TABLET | ORAL | 0 refills | Status: DC

## 2020-04-01 NOTE — Progress Notes (Signed)
FERGUSON RENSING PA:[id number] 13-Nov-1951 69 y.o.     Subjective:   Patient ID:  [name] is a 69 y.o. (DOB 1951/02/06) male.  Chief Complaint:  Chief Complaint  Patient presents with  . Follow-up    Zoloft started for irritability  . Depression  . Anxiety    Depression        Associated symptoms include no decreased concentration and no suicidal ideas.  [name] presents for follow-up of depression and medication changes.  At visit February 12, 2019.  He had become more depressed off of the [name] and it was recommended he restart [name] 1.5 mg daily.  He also had previously felt edgy and so we reduced the Wellbutrin from 450 to 300 mg daily in hopes of cutting down on that edgy feeling. He felt edgier after reducing the Wellbutrin to 300 so increased it back.    visit May 2020.  [name] was increased to 1.5 mg daily to try to help more consistently with depression and anxiety.   visit October 24, 2019.  [name] was stopped because it had no beneficial effect.  Adderall was added.  Last visit February 06, 2020 the following was noted: My highs are higher and lows are lower. Started noticing this more since January.   At times more irritable and needs Xanax.  Off Adderall for 2 mos DT NR.  Last 6 weeks had [name] more alone without wife.  Has a good time with [name].  Gets frustrated that wife complains a lot over what he does.  Don't lose my cool anywhere except at home.  Distracted big time.  Mind wanders.  Nothing is changed at home.  Has talked with wife about it.  They are both their for [name] only.  Unhappy with marriage.  I hate to go out of the world like this.   Nothing I can do about it. Lost 50# and feels good about it.  Has walked a lot and cut back eating.  Step dad died and helps with mother. More irritable lately ? Related to stopping the [name]. Call back if any symptoms are worse. In February 2021 the following was started: Zoloft trial for irritability  and anxiety.  25 mg for 7 day then 50 mg daily.  As of 04/01/20 the following noted: Wife irritable and not feeling well and hasn't noticed any effect of his starting Zoloft.  She gets mad over [name]'s care by pt. I've seen a little difference in calmer in situations at home with Zoloft 50.  No SE.  1 cup coffee and a drink a day.  Trazodone still helps sleep.    Sleep better with trazodone. Pt reports that mood is Anxious, Depressed, Dysphoric and Irritable and describes anxiety as Moderate. Anxiety symptoms include: Excessive Worry,. Pt reports no sleep issues. Pt reports that appetite is good. Pt reports that energy is lethargic and down slightly and loss of interest or pleasure in usual activities. Concentration is difficulty with focus and attention. Suicidal thoughts:  denied by patient.  Brain doesn't shut off.  Somatic worry despite normal PE lately.  Hard to push himself into hobbies too.  Unselttl;ed.  Bored in retirement and lonely at home.  W preoccupied and consumed with developmentally disabled son [name] and ignores pt.  Even harder time keeping [name] and managing his emotions about it.  Some rumination and guilt on his thoughts.  Past Psychiatric Medication Trials: [name] 1.5, Abilify side effects in 2012, risperidone,  paroxetine,  venlafaxine,  nortriptyline, duloxetine,  Pristiq, amitriptyline,,  lithium, lamotrigine, [name] no response Ambien cognitive side effects, Lunesta cognitive side effects, , trazodone,Xanax, buspirone [place]o, Nuvigil, Concerta, Vyvanse  Review of Systems:  Review of Systems  Musculoskeletal: Positive for back pain.  Neurological: Positive for tremors. Negative for weakness.  Psychiatric/Behavioral: Positive for depression and dysphoric mood. Negative for agitation, behavioral problems, confusion, decreased concentration, hallucinations, self-injury, sleep disturbance and suicidal ideas. The patient is nervous/anxious. The patient is not  hyperactive.     Medications: I have reviewed the patient's current medications.  Current Outpatient Medications  Medication Sig Dispense Refill  . ALPRAZolam (XANAX) 0.5 MG tablet Take 1 tablet (0.5 mg total) by mouth 2 (two) times daily as needed for anxiety. 60 tablet 1  . amphetamine-dextroamphetamine (ADDERALL) 10 MG tablet Take 1 tablet (10 mg total) by mouth 2 (two) times daily with a meal. 60 tablet 0  . brimonidine (ALPHAGAN) 0.2 % ophthalmic solution Place 1 drop into both eyes 2 (two) times daily.     Marland Kitchen buPROPion (WELLBUTRIN XL) 150 MG 24 hr tablet Take 3 tablets (450 mg total) by mouth every morning. 270 tablet 3  . docusate sodium (COLACE) 100 MG capsule Take 1 capsule (100 mg total) by mouth 2 (two) times daily as needed. 60 capsule 2  . dorzolamide-timolol (COSOPT) 22.3-6.8 MG/ML ophthalmic solution Place 1 drop into both eyes 2 (two) times daily.    . fluticasone (FLONASE) 50 MCG/ACT nasal spray Place 2 sprays into both nostrils daily as needed for allergies.   2  . lamoTRIgine (LAMICTAL) 200 MG tablet Take 1 tablet (200 mg total) by mouth daily. 90 tablet 3  . latanoprost (XALATAN) 0.005 % ophthalmic solution Place 1 drop into both eyes at bedtime.    . pantoprazole (PROTONIX) 40 MG tablet Take 1 tablet (40 mg total) by mouth 2 (two) times daily before a meal. 60 tablet 5  . polyethylene glycol (MIRALAX / GLYCOLAX) 17 g packet Take 17 g by mouth daily.    . sertraline (ZOLOFT) 100 MG tablet 1/2 tablet daily for 1 week then 1 daily 90 tablet 0  . traZODone (DESYREL) 50 MG tablet TAKE 1 TO 2 TABLETS(50 TO 100 MG) BY MOUTH AT BEDTIME AS NEEDED FOR SLEEP OR INSOMNIA 180 tablet 3  . vitamin B-12 (CYANOCOBALAMIN) 500 MCG tablet Take 500 mcg every evening by mouth.    Marland Kitchen amphetamine-dextroamphetamine (ADDERALL) 10 MG tablet Take 1.5 tablets (15 mg total) by mouth 2 (two) times daily with a meal. 90 tablet 0   No current facility-administered medications for this visit.     Medication Side Effects: None  Allergies:  Allergies  Allergen Reactions  . Ambien [Zolpidem Tartrate]     Sleep walks  . Bactrim [Sulfamethoxazole-Trimethoprim] Hives and Swelling  . Dilaudid [Hydromorphone Hcl] Other (See Comments)    Made blood pressure drop with colon resection surgery in 2007   . Morphine And Related Nausea And Vomiting    Past Medical History:  Diagnosis Date  . Anemia    hx of   . Arthritis    fingers   . BPH (benign prostatic hyperplasia)   . Colon cancer ([place])    stage II, diagnosed 2007  . Complication of anesthesia    vagal response after umbilcal hernia at [facility]  . Depression   . Diabetes mellitus    no as of 09/08/16 due to weight loss   . GERD (gastroesophageal reflux disease)   . Glaucoma 2012  .  History of kidney stones   . Sleep apnea    wears CiPaP at night  . Staph infection 2008   left side of face    Family History  Problem Relation Age of Onset  . Lung cancer Father   . Diabetes Mother   . Anesthesia problems Neg Hx   . Hypotension Neg Hx   . Malignant hyperthermia Neg Hx   . Pseudochol deficiency Neg Hx     Social History   Socioeconomic History  . Marital status: Married    Spouse name: Not on file  . Number of children: 1  . Years of education: Not on file  . Highest education level: Not on file  Occupational History  . Occupation: school system    Employer: [employer]  Tobacco Use  . Smoking status: Never Smoker  . Smokeless tobacco: Never Used  . Tobacco comment: Never smoked  Substance and Sexual Activity  . Alcohol use: No    Alcohol/week: 0.0 standard drinks  . Drug use: No  . Sexual activity: Yes    Birth control/protection: None  Other Topics Concern  . Not on file  Social History Narrative  . Not on file   Social Determinants of Health   Financial Resource Strain:   . Difficulty of Paying Living Expenses:   Food Insecurity:   . Worried About Charity fundraiser in the Last  Year:   . Arboriculturist in the Last Year:   Transportation Needs:   . Film/video editor (Medical):   Marland Kitchen Lack of Transportation (Non-Medical):   Physical Activity:   . Days of Exercise per Week:   . Minutes of Exercise per Session:   Stress:   . Feeling of Stress :   Social Connections:   . Frequency of Communication with Friends and Family:   . Frequency of Social Gatherings with Friends and Family:   . Attends Religious Services:   . Active Member of Clubs or Organizations:   . Attends Archivist Meetings:   Marland Kitchen Marital Status:   Intimate Partner Violence:   . Fear of Current or Ex-Partner:   . Emotionally Abused:   Marland Kitchen Physically Abused:   . Sexually Abused:     Past Medical History, Surgical history, Social history, and Family history were reviewed and updated as appropriate.   Please see review of systems for further details on the patient's review from today.   Objective:   Physical Exam:  There were no vitals taken for this visit.  Physical Exam Constitutional:      General: He is not in acute distress.    Appearance: He is well-developed.  Musculoskeletal:        General: No deformity.  Neurological:     Mental Status: He is alert and oriented to person, place, and time.     Cranial Nerves: No dysarthria.     Coordination: Coordination normal.  Psychiatric:        Attention and Perception: Perception normal. He is inattentive. He does not perceive auditory or visual hallucinations.        Mood and Affect: Mood is depressed. Mood is not anxious. Affect is not labile, blunt, angry or inappropriate.        Speech: Speech normal.        Behavior: Behavior normal. Behavior is cooperative.        Thought Content: Thought content normal. Thought content is not paranoid or delusional. Thought content does not  include homicidal or suicidal ideation. Thought content does not include homicidal or suicidal plan.        Cognition and Memory: Cognition and memory  normal.        Judgment: Judgment normal.     Comments: Chronic dysthymia is unchanged.  He is not particularly anxious this time.     Lab Review:     Component Value Date/Time   NA 139 08/13/2019 1143   K 4.4 08/13/2019 1143   CL 103 08/13/2019 1143   CO2 28 08/13/2019 1143   GLUCOSE 89 08/13/2019 1143   BUN 14 08/13/2019 1143   CREATININE 0.90 12/11/2019 0832   CALCIUM 9.1 08/13/2019 1143   PROT 6.4 (L) 08/13/2019 1143   ALBUMIN 3.7 08/13/2019 1143   AST 17 08/13/2019 1143   ALT 13 08/13/2019 1143   ALKPHOS 79 08/13/2019 1143   BILITOT 0.7 08/13/2019 1143   GFRNONAA >60 08/13/2019 1143   GFRAA >60 08/13/2019 1143       Component Value Date/Time   WBC 4.6 08/13/2019 1143   RBC 4.76 08/13/2019 1143   HGB 14.1 08/13/2019 1143   HCT 42.6 08/13/2019 1143   PLT 149 (L) 08/13/2019 1143   MCV 89.5 08/13/2019 1143   MCH 29.6 08/13/2019 1143   MCHC 33.1 08/13/2019 1143   RDW 12.7 08/13/2019 1143   LYMPHSABS 0.7 07/31/2019 0235   MONOABS 0.9 07/31/2019 0235   EOSABS 0.0 07/31/2019 0235   BASOSABS 0.0 07/31/2019 0235    No results found for: POCLITH, LITHIUM   No results found for: PHENYTOIN, PHENOBARB, VALPROATE, CBMZ   Normal stress test.  No exercise.  Won't stick with diet. .res Assessment: Plan:    Major depressive disorder, recurrent episode, moderate ([place]) - Plan: sertraline (ZOLOFT) 100 MG tablet  Dysthymia - Plan: sertraline (ZOLOFT) 100 MG tablet  Attention deficit hyperactivity disorder (ADHD), combined type - Plan: amphetamine-dextroamphetamine (ADDERALL) 10 MG tablet  Generalized anxiety disorder  Marital dysfunction   As noted above Lanny Hurst has treatment resistant major depression and dysthymia.  He has failed multiple medications listed above.  At visit February 2021 we initiated trial of sertraline 50 mg daily which has been helpful for anxiety and irritability.  He has had no side effects.  He would like to try and increase to see if it could be more  helpful.  Supportive therapy on problems with home life.  Trying to motivate for the hobbies.    Zoloft trial for irritability and anxiety.  Trial increase to 100 mg daily.  Disc SE  Xanax 0.5 mg bid prn anxiety. Using rarely for sleep.  It is worth noting that he was worse with irritability and edginess after reducing the Wellbutrin to 300 mg daily and better at 450 mg daily so we will continue Wellbutrin 450 mg daily.  Chronic attention problems remain and he is failed multiple ADD meds including off label things such as modafinil and Nuvigil .  His wife complains of his ADD symptoms as well.  He would like to try another medication to see as if it is helpful.  We discussed side effects in detail.  Consider low dose Adderall or off-label Aricept. Discussed potential benefits, risks, and side effects of stimulants with patient to include increased heart rate, palpitations, insomnia, increased anxiety, increased irritability, or decreased appetite.  Instructed patient to contact office if experiencing any significant tolerability issues. Adderall trial 5-15 BID  This appt was 30 mins.  FU 6 weeks  Lynder Parents, MD,  DFAPA   Please see After Visit Summary for patient specific instructions.  Future Appointments  Date Time Provider [place]  05/09/2020 11:30 AM Cottle, Billey Co., MD [facility] None  05/29/2020 10:00 AM Erenest Rasher, PA-C [facility]    No orders of the defined types were placed in this encounter.     -------------------------------

## 2020-04-01 NOTE — Telephone Encounter (Signed)
Apt today.

## 2020-04-04 DIAGNOSIS — Z85038 Personal history of other malignant neoplasm of large intestine: Secondary | ICD-10-CM | POA: Diagnosis not present

## 2020-04-04 DIAGNOSIS — D518 Other vitamin B12 deficiency anemias: Secondary | ICD-10-CM | POA: Diagnosis not present

## 2020-04-11 DIAGNOSIS — E039 Hypothyroidism, unspecified: Secondary | ICD-10-CM | POA: Diagnosis not present

## 2020-04-11 DIAGNOSIS — Z6831 Body mass index (BMI) 31.0-31.9, adult: Secondary | ICD-10-CM | POA: Diagnosis not present

## 2020-04-11 DIAGNOSIS — C189 Malignant neoplasm of colon, unspecified: Secondary | ICD-10-CM | POA: Diagnosis not present

## 2020-04-11 DIAGNOSIS — R7301 Impaired fasting glucose: Secondary | ICD-10-CM | POA: Diagnosis not present

## 2020-04-24 ENCOUNTER — Other Ambulatory Visit: Payer: Self-pay | Admitting: Psychiatry

## 2020-04-28 DIAGNOSIS — L570 Actinic keratosis: Secondary | ICD-10-CM | POA: Diagnosis not present

## 2020-04-28 DIAGNOSIS — D225 Melanocytic nevi of trunk: Secondary | ICD-10-CM | POA: Diagnosis not present

## 2020-04-28 DIAGNOSIS — X32XXXD Exposure to sunlight, subsequent encounter: Secondary | ICD-10-CM | POA: Diagnosis not present

## 2020-04-28 DIAGNOSIS — Z1283 Encounter for screening for malignant neoplasm of skin: Secondary | ICD-10-CM | POA: Diagnosis not present

## 2020-05-01 ENCOUNTER — Other Ambulatory Visit: Payer: Self-pay | Admitting: Psychiatry

## 2020-05-06 ENCOUNTER — Telehealth: Payer: Self-pay | Admitting: Psychiatry

## 2020-05-06 NOTE — Telephone Encounter (Signed)
Pt requesting refills on his Zoloft,Xanax and whatever else is due before his appointment on 6/25. This was a reschedule due to provider being out. Fill at [facility] on [address].

## 2020-05-07 ENCOUNTER — Other Ambulatory Visit: Payer: Self-pay

## 2020-05-07 DIAGNOSIS — F411 Generalized anxiety disorder: Secondary | ICD-10-CM

## 2020-05-07 DIAGNOSIS — F902 Attention-deficit hyperactivity disorder, combined type: Secondary | ICD-10-CM

## 2020-05-07 MED ORDER — ALPRAZOLAM 0.5 MG PO TABS: 0.5000 mg | ORAL_TABLET | Freq: Two times a day (BID) | ORAL | 1 refills | Status: DC | PRN

## 2020-05-07 MED ORDER — AMPHETAMINE-DEXTROAMPHETAMINE 10 MG PO TABS: 15.0000 mg | ORAL_TABLET | Freq: Two times a day (BID) | ORAL | 0 refills | Status: DC

## 2020-05-07 NOTE — Telephone Encounter (Signed)
Rx's pended for Dr. Clovis Pu to submit

## 2020-05-09 ENCOUNTER — Ambulatory Visit: Payer: Medicare PPO | Admitting: Psychiatry

## 2020-05-15 ENCOUNTER — Ambulatory Visit: Payer: Medicare PPO | Admitting: Psychiatry

## 2020-05-28 ENCOUNTER — Encounter: Payer: Self-pay | Admitting: Gastroenterology

## 2020-05-28 NOTE — Progress Notes (Signed)
Referring Provider: Rory Percy, MD Primary Care Physician:  Rory Percy, MD Primary GI Physician: Dr. Gala Romney  Chief Complaint  Patient presents with  . small bowel lesion    HPI:   [name] is a 69 y.o. male presenting today for follow-up   History of stage II colon cancer with resection in 2007 with known anastomotic ulcer. He also has a history of abnormal LFTs (wnl since Dec. 2015), fatty liver. EGD November 2016 with 2-3 cm submucosal, hard mass within 8-9 mm area of central ulceration in the proximal esophagus. Salmon-colored epithelium also noted suspicious for Barrett's. No esophagitis. Gastric biopsy with chronic active gastritis with H pylori, subsequently treated (documented eradication in March 2017), esophagogastric junction biopsy gastric-type mucosa with mixed inflammation and lymphoid follicles. Esophageal mass biopsy benign, it was felt that biopsy may not be sufficient due to the approach. He subsequently underwent an EUS with Dr. Owens Loffler on 11/20/2015. At this time the EGD findings were entirely normal. EUS findings of the esophagus normal. He had a CT of the chest which was normal. Questionable mass related to GERD, edema resolved after starting PPI therapy?Marland Kitchen  Most recent EGD in November 2018 (for nausea) completely normal. No recommendations to repeat.   Small bowel obstruction in August 2020.   Last seen in our office in August 2020 after small bowel obstruction and was clinically doing well but did note some difficulty with constipation. He was started on MiraLAX and also scheduled for updated colonoscopy due to history of anastomotic ulcer.   TCS 10/11/2019: Diverticulosis in the sigmoid colon and descending colon, focal ulcerations in the distal neoterminal ileum s/p biopsy.  Recommended repeat colonoscopy in 5 years for surveillance.  Pathology revealed focal acute inflammation and granulation tissue consistent with ulcer.  No granulomas, adenomatous  change, or malignancy.  Recommended Givens capsule to evaluate small bowel.   Givens capsule 11/12/2019: Scattered erosions throughout the small bowel.  Ulceration again seen at the anastomosis.  No active bleeding.  Suspected possible ischemia.  NSAIDs are possibility but patient denied.  Otherwise, nonspecific.  Could be early inflammatory bowel disease but findings are subtle.  Additionally, there was a questionable lesion found within the small bowel with recommendations to pursue CT for further evaluation.  Otherwise, recommended 25-month follow-up.  CTE 01/10/2020: No concerning lesions identified.  No small bowel mass, inflammation, obstruction, or stricturing.  Possible small bowel diverticulum without inflammation.  Dr. Gala Romney also reviewed study.  Stated we may have just simply seen a "incidentaloma" on Givens.  Could consider repeating the scan in 1 year.  No strong case for IBD.  Today: No NSAIDs. BMs daily. The more water he drinks the better his bowels move. Stools are between bristol 3-5. Will take MiraLAX as needed if he skips a day. 1-2 times a week. No fiber supplement. Has gained some weight. About 4 weeks ago he was having a lot of burping. This has resolved. Rare breakthrough reflux symptoms. Eating popcorn before bed. Taking Protonix BID. No dysphagia. No blood in the stool or melena. No abdominal pain.   Past Medical History:  Diagnosis Date  . Anemia    hx of   . Arthritis    fingers   . BPH (benign prostatic hyperplasia)   . Colon cancer ([place])    stage II, diagnosed 2007  . Complication of anesthesia    vagal response after umbilcal hernia at [facility]  . Depression   . Diabetes mellitus  no as of 09/08/16 due to weight loss   . GERD (gastroesophageal reflux disease)   . Glaucoma 2012  . H. pylori infection 10/2015   Documented eradication March 2017  . History of kidney stones   . Sleep apnea    wears CiPaP at night  . Staph infection 2008   left side of face     Past Surgical History:  Procedure Laterality Date  . AGILE CAPSULE N/A 10/29/2019   Procedure: AGILE CAPSULE;  Surgeon: Daneil Dolin, MD;  [facility];  Service: Endoscopy;  Laterality: N/A;  7:30am  . APPENDECTOMY    . BIOPSY  10/11/2019   Procedure: BIOPSY;  Surgeon: Daneil Dolin, MD;  Location: [facility];  Service: Endoscopy;;  colon  . CARDIAC CATHETERIZATION    . COLECTOMY  2007   right hemicolectomy  . COLONOSCOPY  05/11/10   normal rectum,normal residual colon with some angry appearring anastomtic mucosa with some erosions and oozing, bx unremarkable  . COLONOSCOPY  05/16/2012   Dr. Gala Romney- normal rectum, sigmoid diverticulosis, anastomotic ulcers. bx= acute ulcer and benign colonic mucosa with intramucosal lympoid aggregates  . COLONOSCOPY N/A 11/17/2016   Dr. Gala Romney: Ulcerated mucosa were present at the anastomosis. benign bx. next TCS in 10/2021.  Marland Kitchen COLONOSCOPY WITH PROPOFOL N/A 10/11/2019   Procedure: COLONOSCOPY WITH PROPOFOL;  Surgeon: Daneil Dolin, MD;  Diverticulosis in the sigmoid colon and descending colon, focal ulcerations in the distal neoterminal ileum s/p biopsy.  Pathology consistent with ulcer, no adenomatous change or malignancy.  Recommended repeat colonoscopy in 5 years for surveillance.   . ESOPHAGEAL DILATION N/A 10/28/2015   Procedure: ESOPHAGEAL DILATION;  Surgeon: Daneil Dolin, MD;  [facility];  Service: Endoscopy;  Laterality: N/A;  . ESOPHAGOGASTRODUODENOSCOPY N/A 10/28/2015   RMR: ulcerated proximal esophageal mass ad described status post biopsy. Abnormal distal esophagus biopsy. status post biopsy. Hiatal hernia . abnormal gastric mucosa doubt clinical significance status post biopsy  . ESOPHAGOGASTRODUODENOSCOPY (EGD) WITH PROPOFOL N/A 11/07/2017   Procedure: ESOPHAGOGASTRODUODENOSCOPY (EGD) WITH PROPOFOL;  Surgeon: Daneil Dolin, MD;  [facility];  Service: Endoscopy;  Laterality: N/A;  8:15am  . EUS  N/A 11/20/2015   Procedure: UPPER ENDOSCOPIC ULTRASOUND (EUS) LINEAR;  Surgeon: Milus Banister, MD;  Location: [facility];  Service: Endoscopy;  Laterality: N/A;  . GIVENS CAPSULE STUDY N/A 11/12/2019   Procedure: GIVENS CAPSULE STUDY;  Surgeon: Daneil Dolin, MD; scattered erosions, anastomotic ulcer, no active bleeding.  Questionable lesion with follow-up CTE negative.  Anastomotic ulcer possibly secondary to ischemia.  Could be early inflammatory bowel disease but findings are subtle and patient is clinically asymptomatic.  Marland Kitchen HEMORRHOID SURGERY  12/31/2011   Procedure: HEMORRHOIDECTOMY;  Surgeon: Jamesetta So;  Location: [facility];  Service: General;  Laterality: N/A;  . HERNIA REPAIR     umbilical  . KIDNEY STONE SURGERY    . PILONIDAL CYST / SINUS EXCISION  1976  . SKIN LESION EXCISION     left shoulder/pre cancerous  . TRANSURETHRAL RESECTION OF PROSTATE N/A 09/09/2016   Procedure: TRANSURETHRAL RESECTION OF THE PROSTATE (TURP);  Surgeon: Irine Seal, MD;  Location: [facility];  Service: Urology;  Laterality: N/A;    Current Outpatient Medications  Medication Sig Dispense Refill  . ALPRAZolam (XANAX) 0.5 MG tablet Take 1 tablet (0.5 mg total) by mouth 2 (two) times daily as needed for anxiety. 60 tablet 1  . amphetamine-dextroamphetamine (ADDERALL) 10 MG tablet Take 1  tablet (10 mg total) by mouth 2 (two) times daily with a meal. 60 tablet 0  . amphetamine-dextroamphetamine (ADDERALL) 10 MG tablet Take 1.5 tablets (15 mg total) by mouth 2 (two) times daily with a meal. 90 tablet 0  . brimonidine (ALPHAGAN) 0.2 % ophthalmic solution Place 1 drop into both eyes 2 (two) times daily.     Marland Kitchen buPROPion (WELLBUTRIN XL) 150 MG 24 hr tablet TAKE 3 TABLETS(450 MG) BY MOUTH EVERY MORNING 270 tablet 3  . dorzolamide-timolol (COSOPT) 22.3-6.8 MG/ML ophthalmic solution Place 1 drop into both eyes 2 (two) times daily.    . fluticasone (FLONASE) 50 MCG/ACT nasal spray Place 2 sprays into both nostrils daily  as needed for allergies.   2  . lamoTRIgine (LAMICTAL) 200 MG tablet TAKE 1 TABLET(200 MG) BY MOUTH DAILY 90 tablet 0  . latanoprost (XALATAN) 0.005 % ophthalmic solution Place 1 drop into both eyes at bedtime.    . pantoprazole (PROTONIX) 40 MG tablet Take 1 tablet (40 mg total) by mouth 2 (two) times daily before a meal. 60 tablet 5  . polyethylene glycol (MIRALAX / GLYCOLAX) 17 g packet Take 17 g by mouth daily. Taking PRN    . traZODone (DESYREL) 50 MG tablet TAKE 1 TO 2 TABLETS(50 TO 100 MG) BY MOUTH AT BEDTIME AS NEEDED FOR SLEEP OR INSOMNIA 180 tablet 3  . vitamin B-12 (CYANOCOBALAMIN) 500 MCG tablet Take 500 mcg every evening by mouth.    . sertraline (ZOLOFT) 100 MG tablet 1/2 tablet daily for 1 week then 1 daily (Patient not taking: Reported on 05/29/2020) 90 tablet 0   No current facility-administered medications for this visit.    Allergies as of 05/29/2020 - Review Complete 05/29/2020  Allergen Reaction Noted  . Ambien [zolpidem tartrate]  09/12/2018  . Bactrim [sulfamethoxazole-trimethoprim] Hives and Swelling 09/08/2016  . Dilaudid [hydromorphone hcl] Other (See Comments) 09/08/2016  . Morphine and related Nausea And Vomiting 09/08/2016    Family History  Problem Relation Age of Onset  . Lung cancer Father   . Diabetes Mother   . Anesthesia problems Neg Hx   . Hypotension Neg Hx   . Malignant hyperthermia Neg Hx   . Pseudochol deficiency Neg Hx   . Colon cancer Neg Hx     Social History   Socioeconomic History  . Marital status: Married    Spouse name: Not on file  . Number of children: 1  . Years of education: Not on file  . Highest education level: Not on file  Occupational History  . Occupation: school system    Employer: [employer]  Tobacco Use  . Smoking status: Never Smoker  . Smokeless tobacco: Never Used  . Tobacco comment: Never smoked  Vaping Use  . Vaping Use: Never used  Substance and Sexual Activity  . Alcohol use: No     Alcohol/week: 0.0 standard drinks  . Drug use: No  . Sexual activity: Yes    Birth control/protection: None  Other Topics Concern  . Not on file  Social History Narrative  . Not on file   Social Determinants of Health   Financial Resource Strain:   . Difficulty of Paying Living Expenses:   Food Insecurity:   . Worried About Charity fundraiser in the Last Year:   . Arboriculturist in the Last Year:   Transportation Needs:   . Film/video editor (Medical):   Marland Kitchen Lack of Transportation (Non-Medical):   Physical Activity:   .  Days of Exercise per Week:   . Minutes of Exercise per Session:   Stress:   . Feeling of Stress :   Social Connections:   . Frequency of Communication with Friends and Family:   . Frequency of Social Gatherings with Friends and Family:   . Attends Religious Services:   . Active Member of Clubs or Organizations:   . Attends Archivist Meetings:   Marland Kitchen Marital Status:     Review of Systems: Gen: Denies fever, chills, cold or flulike symptoms, lightheadedness, dizziness, presyncope, syncope CV: Denies chest pain or palpitations. Resp: Denies dyspnea or cough GI: See HPI Heme: See HPI  Physical Exam: BP (!) 144/78   Pulse 63   Temp (!) 97.1 F (36.2 C) (Oral)   Ht 5\' 9"  (1.753 m)   Wt 209 lb 12.8 oz (95.2 kg)   BMI 30.98 kg/m  General:   Alert and oriented. No distress noted. Pleasant and cooperative.  Head:  Normocephalic and atraumatic. Eyes:  Conjuctiva clear without scleral icterus. Heart:  S1, S2 present without murmurs appreciated. Lungs:  Clear to auscultation bilaterally. No wheezes, rales, or rhonchi. No distress.  Abdomen:  +BS, soft, non-tender and non-distended. No rebound or guarding. No HSM or masses noted. Msk:  Symmetrical without gross deformities. Normal posture. Extremities:  Without edema. Neurologic:  Alert and  oriented x4 Psych: Normal mood and affect.

## 2020-05-29 ENCOUNTER — Ambulatory Visit: Payer: Medicare Other | Admitting: Gastroenterology

## 2020-05-29 ENCOUNTER — Encounter: Payer: Self-pay | Admitting: Gastroenterology

## 2020-05-29 ENCOUNTER — Other Ambulatory Visit: Payer: Self-pay

## 2020-05-29 VITALS — BP 144/78 | HR 63 | Temp 97.1°F | Ht 69.0 in | Wt 209.8 lb

## 2020-05-29 DIAGNOSIS — Z85038 Personal history of other malignant neoplasm of large intestine: Secondary | ICD-10-CM

## 2020-05-29 DIAGNOSIS — K289 Gastrojejunal ulcer, unspecified as acute or chronic, without hemorrhage or perforation: Secondary | ICD-10-CM | POA: Insufficient documentation

## 2020-05-29 DIAGNOSIS — K639 Disease of intestine, unspecified: Secondary | ICD-10-CM | POA: Insufficient documentation

## 2020-05-29 DIAGNOSIS — K5909 Other constipation: Secondary | ICD-10-CM | POA: Diagnosis not present

## 2020-05-29 DIAGNOSIS — K219 Gastro-esophageal reflux disease without esophagitis: Secondary | ICD-10-CM

## 2020-05-29 NOTE — Assessment & Plan Note (Signed)
Addressed under history of colon cancer.  

## 2020-05-29 NOTE — Assessment & Plan Note (Addendum)
Mild constipation using MiraLAX as needed which seems to be working well for him.  No alarm symptoms.  Colonoscopy up-to-date in October 2020.  Due for surveillance in 5 years.  Advised to continue using MiraLAX 17 g daily as needed.  Also recommended adding Benefiber or Metamucil every day and to drink enough water to keep urine pale yellow to clear. Follow-up in 6 months.

## 2020-05-29 NOTE — Assessment & Plan Note (Signed)
Well-controlled on Protonix 40 mg twice daily.  Advised to continue his current medications and follow-up in 6 months.

## 2020-05-29 NOTE — Patient Instructions (Addendum)
Continue taking Protonix 40 mg twice daily 30 minutes before breakfast and dinner.  Continue taking MiraLAX as needed for intermittent constipation.  You may increase to daily if your stools become a little harder on a regular basis.  I recommend you start Benefiber or Metamucil daily.  Drink enough water to keep your urine pale yellow to clear.  Avoid all NSAIDs including ibuprofen, Aleve, Advil, aspirin powders, naproxen, meloxicam or anything that has "NSAID" listed.  We will see you back in 6 months.  Call with questions or concerns prior.  Aliene Altes, PA-C [facility]     Constipation, Adult Constipation is when a person:  Poops (has a bowel movement) fewer times in a week than normal.  Has a hard time pooping.  Has poop that is dry, hard, or bigger than normal. Follow these instructions at home: Eating and drinking   Eat foods that have a lot of fiber, such as: ? Fresh fruits and vegetables. ? Whole grains. ? Beans.  Eat less of foods that are high in fat, low in fiber, or overly processed, such as: ? [place] fries. ? Hamburgers. ? Cookies. ? Candy. ? Soda.  Drink enough fluid to keep your pee (urine) clear or pale yellow. General instructions  Exercise regularly or as told by your doctor.  Go to the restroom when you feel like you need to poop. Do not hold it in.  Take over-the-counter and prescription medicines only as told by your doctor. These include any fiber supplements.  Do pelvic floor retraining exercises, such as: ? Doing deep breathing while relaxing your lower belly (abdomen). ? Relaxing your pelvic floor while pooping.  Watch your condition for any changes.  Keep all follow-up visits as told by your doctor. This is important. Contact a doctor if:  You have pain that gets worse.  You have a fever.  You have not pooped for 4 days.  You throw up (vomit).  You are not hungry.  You lose weight.  You are bleeding  from the anus.  You have thin, pencil-like poop (stool). Get help right away if:  You have a fever, and your symptoms suddenly get worse.  You leak poop or have blood in your poop.  Your belly feels hard or bigger than normal (is bloated).  You have very bad belly pain.  You feel dizzy or you faint. This information is not intended to replace advice given to you by your health care provider. Make sure you discuss any questions you have with your health care provider. Document Revised: 11/18/2017 Document Reviewed: 05/26/2016 Elsevier Patient Education  2020 [employer].

## 2020-05-29 NOTE — Assessment & Plan Note (Addendum)
History of stage II colon cancer with resection in 2007 with known anastomotic ulcer.  Colonoscopy October 2020 with diverticulosis, focal ulceration in the distal neoterminal ileum s/p biopsy.  Pathology revealed focal acute inflammation and granulation tissue consistent with ulcer, no adenomatous change or malignancy.  Recommended repeat colonoscopy in 5 years for surveillance.  Further evaluation of anastomotic ulcer was pursued with Givens capsule in November 2020.  There were scattered erosions throughout the small bowel and ulceration again seen at the anastomosis.  No active bleeding.  Suspected possible ischemia as cause of ulcer.  Patient denies any significant NSAID use.  Could be early inflammatory bowel disease but findings are subtle and patient continues to be without any signs or symptoms of this.   Will continue to monitor.  Advised he continue to avoid all NSAIDs and continue protonix BID.  Plan to follow-up in 6 months.

## 2020-05-29 NOTE — Assessment & Plan Note (Signed)
Questionable small bowel lesion on Givens capsule 11/12/2019.  Follow-up CTE 01/10/2020 unremarkable other than possible small bowel diverticulum without inflammation.  Discussed with Dr. Gala Romney who stated we may have just simply seen a "incidentaloma" on Givens.  Could consider repeating the scan in 1 year.  Patient is doing well at this time with no significant upper or lower GI symptoms and no alarm symptoms.  We will plan to follow-up in 6 months.

## 2020-06-02 NOTE — Progress Notes (Signed)
CC'ED TO PCP 

## 2020-06-10 ENCOUNTER — Ambulatory Visit: Payer: Medicare PPO | Admitting: Psychiatry

## 2020-06-13 ENCOUNTER — Ambulatory Visit (INDEPENDENT_AMBULATORY_CARE_PROVIDER_SITE_OTHER): Payer: Medicare PPO | Admitting: Psychiatry

## 2020-06-13 ENCOUNTER — Other Ambulatory Visit: Payer: Self-pay

## 2020-06-13 ENCOUNTER — Encounter: Payer: Self-pay | Admitting: Psychiatry

## 2020-06-13 DIAGNOSIS — F39 Unspecified mood [affective] disorder: Secondary | ICD-10-CM

## 2020-06-13 DIAGNOSIS — F902 Attention-deficit hyperactivity disorder, combined type: Secondary | ICD-10-CM | POA: Diagnosis not present

## 2020-06-13 DIAGNOSIS — F411 Generalized anxiety disorder: Secondary | ICD-10-CM | POA: Diagnosis not present

## 2020-06-13 DIAGNOSIS — F341 Dysthymic disorder: Secondary | ICD-10-CM

## 2020-06-13 MED ORDER — AMPHETAMINE-DEXTROAMPHETAMINE 30 MG PO TABS: 15.0000 mg | ORAL_TABLET | Freq: Two times a day (BID) | ORAL | 0 refills | Status: DC

## 2020-06-13 NOTE — Progress Notes (Signed)
[name] [id number] 09/18/1951 69 y.o.     Subjective:   Patient ID:  [name] is a 69 y.o. (DOB 1951/04/27) male.  Chief Complaint:  Chief Complaint  Patient presents with  . Follow-up  . Depression  . ADHD  . Stress    Depression        Associated symptoms include no decreased concentration and no suicidal ideas.  [name] presents for follow-up of depression and medication changes.  At visit February 12, 2019.  He had become more depressed off of the [name] and it was recommended he restart [name] 1.5 mg daily.  He also had previously felt edgy and so we reduced the Wellbutrin from 450 to 300 mg daily in hopes of cutting down on that edgy feeling. He felt edgier after reducing the Wellbutrin to 300 so increased it back.    visit May 2020.  [name] was increased to 1.5 mg daily to try to help more consistently with depression and anxiety.   visit October 24, 2019.  [name] was stopped because it had no beneficial effect.  Adderall was added.  Last visit February 06, 2020 the following was noted: My highs are higher and lows are lower. Started noticing this more since January.   At times more irritable and needs Xanax.  Off Adderall for 2 mos DT NR.  Last 6 weeks had [name] more alone without wife.  Has a good time with [name].  Gets frustrated that wife complains a lot over what he does.  Don't lose my cool anywhere except at home.  Distracted big time.  Mind wanders.  Nothing is changed at home.  Has talked with wife about it.  They are both their for [name] only.  Unhappy with marriage.  I hate to go out of the world like this.   Nothing I can do about it. Lost 50# and feels good about it.  Has walked a lot and cut back eating.  Step dad died and helps with mother. More irritable lately ? Related to stopping the [name]. Call back if any symptoms are worse. In February 2021 the following was started: Zoloft trial for irritability and anxiety.  25 mg for 7  day then 50 mg daily.  As of 04/01/20 the following noted: Wife irritable and not feeling well and hasn't noticed any effect of his starting Zoloft.  She gets mad over [name]'s care by pt. I've seen a little difference in calmer in situations at home with Zoloft 50.  No SE.  1 cup coffee and a drink a day.  Trazodone still helps sleep.    06/13/20 appt with the following noted: Adderall helping productivity and focus.  Wife notices Out of sertraline.  Couldn't tell a lot of difference with the Zoloft but not sure. No SE Adderall. I know I'm bipolar bc can get on a high and drop to a low.  Can be hyperverbal, hyper energetic.  Wife says he's stuck in the past. Wishes he could get rid of depression.  Can enjoy some things.  Sleep better with trazodone. Pt reports that mood is Anxious, Depressed, Dysphoric and Irritable and describes anxiety as Moderate. Anxiety symptoms include: Excessive Worry,. Pt reports no sleep issues. Pt reports that appetite is good. Pt reports that energy is lethargic and down slightly and loss of interest or pleasure in usual activities. Concentration is difficulty with focus and attention. Suicidal thoughts:  denied by patient.  Brain doesn't shut off.  Somatic  worry despite normal PE lately.  Hard to push himself into hobbies too.  Unselttl;ed.  Bored in retirement and lonely at home.  W preoccupied and consumed with developmentally disabled son [name] and ignores pt.  Even harder time keeping [name] and managing his emotions about it.  Some rumination and guilt on his thoughts.  Past Psychiatric Medication Trials: [name] 1.5, Abilify side effects in 2012, risperidone,  paroxetine, venlafaxine,  nortriptyline, duloxetine,  Pristiq, amitriptyline,,  lithium, lamotrigine, [name] no response Ambien cognitive side effects, Lunesta cognitive side effects, , trazodone,Xanax, buspirone [place], Nuvigil, Concerta, Vyvanse, Adderall  Review of Systems:  Review of Systems   Musculoskeletal: Positive for back pain.  Neurological: Positive for tremors. Negative for dizziness and weakness.  Psychiatric/Behavioral: Positive for depression and dysphoric mood. Negative for agitation, behavioral problems, confusion, decreased concentration, hallucinations, self-injury, sleep disturbance and suicidal ideas. The patient is nervous/anxious. The patient is not hyperactive.     Medications: I have reviewed the patient's current medications.  Current Outpatient Medications  Medication Sig Dispense Refill  . ALPRAZolam (XANAX) 0.5 MG tablet Take 1 tablet (0.5 mg total) by mouth 2 (two) times daily as needed for anxiety. 60 tablet 1  . brimonidine (ALPHAGAN) 0.2 % ophthalmic solution Place 1 drop into both eyes 2 (two) times daily.     Marland Kitchen buPROPion (WELLBUTRIN XL) 150 MG 24 hr tablet TAKE 3 TABLETS(450 MG) BY MOUTH EVERY MORNING 270 tablet 3  . dorzolamide-timolol (COSOPT) 22.3-6.8 MG/ML ophthalmic solution Place 1 drop into both eyes 2 (two) times daily.    . fluticasone (FLONASE) 50 MCG/ACT nasal spray Place 2 sprays into both nostrils daily as needed for allergies.   2  . lamoTRIgine (LAMICTAL) 200 MG tablet TAKE 1 TABLET(200 MG) BY MOUTH DAILY 90 tablet 0  . latanoprost (XALATAN) 0.005 % ophthalmic solution Place 1 drop into both eyes at bedtime.    . pantoprazole (PROTONIX) 40 MG tablet Take 1 tablet (40 mg total) by mouth 2 (two) times daily before a meal. 60 tablet 5  . polyethylene glycol (MIRALAX / GLYCOLAX) 17 g packet Take 17 g by mouth daily. Taking PRN    . traZODone (DESYREL) 50 MG tablet TAKE 1 TO 2 TABLETS(50 TO 100 MG) BY MOUTH AT BEDTIME AS NEEDED FOR SLEEP OR INSOMNIA 180 tablet 3  . vitamin B-12 (CYANOCOBALAMIN) 500 MCG tablet Take 500 mcg every evening by mouth.    Marland Kitchen amphetamine-dextroamphetamine (ADDERALL) 30 MG tablet Take 0.5 tablets by mouth 2 (two) times daily. 90 tablet 0  . sertraline (ZOLOFT) 100 MG tablet 1/2 tablet daily for 1 week then 1 daily  (Patient not taking: Reported on 06/13/2020) 90 tablet 0   No current facility-administered medications for this visit.    Medication Side Effects: None  Allergies:  Allergies  Allergen Reactions  . Ambien [Zolpidem Tartrate]     Sleep walks  . Bactrim [Sulfamethoxazole-Trimethoprim] Hives and Swelling  . Dilaudid [Hydromorphone Hcl] Other (See Comments)    Made blood pressure drop with colon resection surgery in 2007   . Morphine And Related Nausea And Vomiting    Past Medical History:  Diagnosis Date  . Anemia    hx of   . Arthritis    fingers   . BPH (benign prostatic hyperplasia)   . Colon cancer ([place])    stage II, diagnosed 2007  . Complication of anesthesia    vagal response after umbilcal hernia at [facility]  . Depression   . Diabetes mellitus  no as of 09/08/16 due to weight loss   . GERD (gastroesophageal reflux disease)   . Glaucoma 2012  . H. pylori infection 10/2015   Documented eradication March 2017  . History of kidney stones   . Sleep apnea    wears CiPaP at night  . Staph infection 2008   left side of face    Family History  Problem Relation Age of Onset  . Lung cancer Father   . Diabetes Mother   . Anesthesia problems Neg Hx   . Hypotension Neg Hx   . Malignant hyperthermia Neg Hx   . Pseudochol deficiency Neg Hx   . Colon cancer Neg Hx     Social History   Socioeconomic History  . Marital status: Married    Spouse name: Not on file  . Number of children: 1  . Years of education: Not on file  . Highest education level: Not on file  Occupational History  . Occupation: school system    Employer: [employer]  Tobacco Use  . Smoking status: Never Smoker  . Smokeless tobacco: Never Used  . Tobacco comment: Never smoked  Vaping Use  . Vaping Use: Never used  Substance and Sexual Activity  . Alcohol use: No    Alcohol/week: 0.0 standard drinks  . Drug use: No  . Sexual activity: Yes    Birth control/protection: None  Other  Topics Concern  . Not on file  Social History Narrative  . Not on file   Social Determinants of Health   Financial Resource Strain:   . Difficulty of Paying Living Expenses:   Food Insecurity:   . Worried About Charity fundraiser in the Last Year:   . Arboriculturist in the Last Year:   Transportation Needs:   . Film/video editor (Medical):   Marland Kitchen Lack of Transportation (Non-Medical):   Physical Activity:   . Days of Exercise per Week:   . Minutes of Exercise per Session:   Stress:   . Feeling of Stress :   Social Connections:   . Frequency of Communication with Friends and Family:   . Frequency of Social Gatherings with Friends and Family:   . Attends Religious Services:   . Active Member of Clubs or Organizations:   . Attends Archivist Meetings:   Marland Kitchen Marital Status:   Intimate Partner Violence:   . Fear of Current or Ex-Partner:   . Emotionally Abused:   Marland Kitchen Physically Abused:   . Sexually Abused:     Past Medical History, Surgical history, Social history, and Family history were reviewed and updated as appropriate.   Please see review of systems for further details on the patient's review from today.   Objective:   Physical Exam:  There were no vitals taken for this visit.  Physical Exam Constitutional:      General: He is not in acute distress.    Appearance: He is well-developed.  Musculoskeletal:        General: No deformity.  Neurological:     Mental Status: He is alert and oriented to person, place, and time.     Cranial Nerves: No dysarthria.     Coordination: Coordination normal.  Psychiatric:        Attention and Perception: Perception normal. He is inattentive. He does not perceive auditory or visual hallucinations.        Mood and Affect: Mood is depressed. Mood is not anxious. Affect is not labile,  blunt, angry or inappropriate.        Speech: Speech normal.        Behavior: Behavior normal. Behavior is cooperative.        Thought  Content: Thought content normal. Thought content is not paranoid or delusional. Thought content does not include homicidal or suicidal ideation. Thought content does not include homicidal or suicidal plan.        Cognition and Memory: Cognition and memory normal.        Judgment: Judgment normal.     Comments: Chronic dysthymia is unchanged.  He is not particularly anxious this time. Irritability is present but not too bad.     Lab Review:     Component Value Date/Time   NA 139 08/13/2019 1143   K 4.4 08/13/2019 1143   CL 103 08/13/2019 1143   CO2 28 08/13/2019 1143   GLUCOSE 89 08/13/2019 1143   BUN 14 08/13/2019 1143   CREATININE 0.90 12/11/2019 0832   CALCIUM 9.1 08/13/2019 1143   PROT 6.4 (L) 08/13/2019 1143   ALBUMIN 3.7 08/13/2019 1143   AST 17 08/13/2019 1143   ALT 13 08/13/2019 1143   ALKPHOS 79 08/13/2019 1143   BILITOT 0.7 08/13/2019 1143   GFRNONAA >60 08/13/2019 1143   GFRAA >60 08/13/2019 1143       Component Value Date/Time   WBC 4.6 08/13/2019 1143   RBC 4.76 08/13/2019 1143   HGB 14.1 08/13/2019 1143   HCT 42.6 08/13/2019 1143   PLT 149 (L) 08/13/2019 1143   MCV 89.5 08/13/2019 1143   MCH 29.6 08/13/2019 1143   MCHC 33.1 08/13/2019 1143   RDW 12.7 08/13/2019 1143   LYMPHSABS 0.7 07/31/2019 0235   MONOABS 0.9 07/31/2019 0235   EOSABS 0.0 07/31/2019 0235   BASOSABS 0.0 07/31/2019 0235    No results found for: POCLITH, LITHIUM   No results found for: PHENYTOIN, PHENOBARB, VALPROATE, CBMZ   Normal stress test.  No exercise.  Won't stick with diet. 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  He thinks now he has bipolar disorder.  He has failed multiple medications listed above. He agrees to  a trial of [place].  Discussed potential metabolic side effects associated with atypical antipsychotics, as well as potential risk for movement side effects. Advised pt to contact office if movement side effects occur.   Supportive therapy on problems with home life.  Trying to motivate for the hobbies.    DC sertraline bc minimal effect. Latuda 20 mg suggested. Take Latuda with at least 350 calories. Take 20 mg daily for 4 weeks. If no effect then increase to 40 mg daily.  Xanax 0.5 mg bid prn anxiety. Using rarely for sleep.  It is worth noting that he was worse with irritability and edginess after reducing the Wellbutrin to 300 mg daily and better at 450 mg daily so we will continue Wellbutrin 450 mg daily.  Chronic attention problems remain and he is failed multiple ADD meds including off label things such as modafinil and Nuvigil .  His wife complains of his ADD symptoms as well.  He would like to try another medication to see as if it is helpful.  We discussed side effects in detail.  Consider low dose Adderall or off-label Aricept. Discussed  potential benefits, risks, and side effects of stimulants with patient to include increased heart rate, palpitations, insomnia, increased anxiety, increased irritability, or decreased appetite.  Instructed patient to contact office if experiencing any significant tolerability issues. Adderall trial 15 BID  This appt was 30 mins.  FU 6 weeks  Lynder Parents, MD, DFAPA   Please see After Visit Summary for patient specific instructions.  Future Appointments  Date Time Provider [place]  12/10/2020  1:30 PM Mahala Menghini, PA-C [facility]    No orders of the defined types were placed in this encounter.     -------------------------------

## 2020-06-13 NOTE — Patient Instructions (Addendum)
Take Latuda with at least 350 calories. Take 20 mg daily for 4 weeks.  If no effect then increase to 40 mg daily.

## 2020-06-17 DIAGNOSIS — H401132 Primary open-angle glaucoma, bilateral, moderate stage: Secondary | ICD-10-CM | POA: Diagnosis not present

## 2020-07-11 ENCOUNTER — Telehealth: Payer: Self-pay | Admitting: Psychiatry

## 2020-07-11 NOTE — Telephone Encounter (Signed)
Pt called to report taking Latuda 10 mg. Started taking 20 mg last week and since then he is forgetting everything. Side effect? Contact # [phone]

## 2020-07-11 NOTE — Telephone Encounter (Signed)
I've not seen anyone else with this SE.  Rec drop dose back to 10 mg for 2 weeks and if tolerated well, increase it bacck to20 mg and let us know if the problem recurs.

## 2020-07-11 NOTE — Telephone Encounter (Signed)
LM with information and to call back with an update or further questions or concerns.

## 2020-07-13 ENCOUNTER — Other Ambulatory Visit: Payer: Self-pay | Admitting: Psychiatry

## 2020-07-13 DIAGNOSIS — F411 Generalized anxiety disorder: Secondary | ICD-10-CM

## 2020-07-14 DIAGNOSIS — Z6830 Body mass index (BMI) 30.0-30.9, adult: Secondary | ICD-10-CM | POA: Diagnosis not present

## 2020-07-14 DIAGNOSIS — H109 Unspecified conjunctivitis: Secondary | ICD-10-CM | POA: Diagnosis not present

## 2020-07-14 DIAGNOSIS — B0239 Other herpes zoster eye disease: Secondary | ICD-10-CM | POA: Diagnosis not present

## 2020-07-29 DIAGNOSIS — B0239 Other herpes zoster eye disease: Secondary | ICD-10-CM | POA: Diagnosis not present

## 2020-08-18 ENCOUNTER — Encounter: Payer: Self-pay | Admitting: Psychiatry

## 2020-08-18 ENCOUNTER — Ambulatory Visit (INDEPENDENT_AMBULATORY_CARE_PROVIDER_SITE_OTHER): Payer: Medicare PPO | Admitting: Psychiatry

## 2020-08-18 ENCOUNTER — Other Ambulatory Visit: Payer: Self-pay

## 2020-08-18 VITALS — BP 154/90 | HR 66

## 2020-08-18 DIAGNOSIS — F902 Attention-deficit hyperactivity disorder, combined type: Secondary | ICD-10-CM

## 2020-08-18 DIAGNOSIS — F411 Generalized anxiety disorder: Secondary | ICD-10-CM | POA: Diagnosis not present

## 2020-08-18 DIAGNOSIS — F39 Unspecified mood [affective] disorder: Secondary | ICD-10-CM

## 2020-08-18 DIAGNOSIS — Z630 Problems in relationship with spouse or partner: Secondary | ICD-10-CM | POA: Diagnosis not present

## 2020-08-18 DIAGNOSIS — F341 Dysthymic disorder: Secondary | ICD-10-CM

## 2020-08-18 MED ORDER — AMPHETAMINE-DEXTROAMPHETAMINE 30 MG PO TABS: 15.0000 mg | ORAL_TABLET | Freq: Two times a day (BID) | ORAL | 0 refills | Status: DC

## 2020-08-18 MED ORDER — LAMOTRIGINE 200 MG PO TABS: ORAL_TABLET | ORAL | 3 refills | Status: DC

## 2020-08-18 NOTE — Progress Notes (Signed)
[name] [id number] Nov 17, 1951 69 y.o.     Subjective:   Patient ID:  [name] is a 69 y.o. (DOB 1951-12-02) male.  Chief Complaint:  Chief Complaint  Patient presents with  . Follow-up     Medication management  . Depression     Medication management  . ADHD     Medication management    Depression        Associated symptoms include no decreased concentration and no suicidal ideas.  [name] presents for follow-up of depression and medication changes.  At visit February 12, 2019.  He had become more depressed off of the [name] and it was recommended he restart [name] 1.5 mg daily.  He also had previously felt edgy and so we reduced the Wellbutrin from 450 to 300 mg daily in hopes of cutting down on that edgy feeling. He felt edgier after reducing the Wellbutrin to 300 so increased it back.    visit May 2020.  [name] was increased to 1.5 mg daily to try to help more consistently with depression and anxiety.   visit October 24, 2019.  [name] was stopped because it had no beneficial effect.  Adderall was added.   visit February 06, 2020 the following was noted: My highs are higher and lows are lower. Started noticing this more since January.   At times more irritable and needs Xanax.  Off Adderall for 2 mos DT NR.  Last 6 weeks had [name] more alone without wife.  Has a good time with [name].  Gets frustrated that wife complains a lot over what he does.  Don't lose my cool anywhere except at home.  Distracted big time.  Mind wanders.  Nothing is changed at home.  Has talked with wife about it.  They are both their for [name] only.  Unhappy with marriage.  I hate to go out of the world like this.   Nothing I can do about it. Lost 50# and feels good about it.  Has walked a lot and cut back eating.  Step dad died and helps with mother. More irritable lately ? Related to stopping the [name]. Call back if any symptoms are worse. In February 2021 the following was  started: Zoloft trial for irritability and anxiety.  25 mg for 7 day then 50 mg daily.  As of 04/01/20 the following noted: Wife irritable and not feeling well and hasn't noticed any effect of his starting Zoloft.  She gets mad over [name]'s care by pt. I've seen a little difference in calmer in situations at home with Zoloft 50.  No SE.  1 cup coffee and a drink a day.  Trazodone still helps sleep.    06/13/20 appt with the following noted: Adderall helping productivity and focus.  Wife notices Out of sertraline.  Couldn't tell a lot of difference with the Zoloft but not sure. No SE Adderall. I know I'm bipolar bc can get on a high and drop to a low.  Can be hyperverbal, hyper energetic.  Wife says he's stuck in the past. Wishes he could get rid of depression.  Can enjoy some things. Plan: DC sertraline bc minimal effect. Latuda 20 mg suggested. Take Latuda with at least 350 calories. Take 20 mg daily for 4 weeks. If no effect then increase to 40 mg daily.  08/18/20 appt with the following noted: No change with Latuda 20 that was any better and stopped it.  Not sure he took 40 mg  daily. Seems like he's busier than he's been but also some free time.  Taking care of Mo.  Cuts grass for 2 days.  Went to [name] to visit someone this weekend. Stress wife afraid constantly of Covid and he has to wear mask in his own house.  Sleep better with trazodone. Pt reports that mood is Anxious, Depressed, Dysphoric and Irritable and describes anxiety as Moderate. Anxiety symptoms include: Excessive Worry,. Pt reports no sleep issues. Pt reports that appetite is good. Pt reports that energy is lethargic and down slightly and loss of interest or pleasure in usual activities. Concentration is difficulty with focus and attention. Suicidal thoughts:  denied by patient.  Brain doesn't shut off.  Somatic worry despite normal PE lately.  Hard to push himself into hobbies too.  Unselttl;ed.  Bored in retirement and  lonely at home.  W preoccupied and consumed with developmentally disabled son [name] and ignores pt.  Even harder time keeping [name] and managing his emotions about it.  Some rumination and guilt on his thoughts.  Past Psychiatric Medication Trials: [name] 1.5, Abilify side effects in 2012, risperidone,  paroxetine, venlafaxine,  nortriptyline, duloxetine,  Pristiq, amitriptyline,,  lithium, lamotrigine, [name] no response Latuda 20 for 3 weeks NR Ambien cognitive side effects, Lunesta cognitive side effects, , trazodone, Xanax, buspirone Eve[place]o, Nuvigil, Concerta, Vyvanse, Adderall  Review of Systems:  Review of Systems  Musculoskeletal: Positive for back pain.  Neurological: Negative for dizziness, tremors and weakness.  Psychiatric/Behavioral: Positive for depression and dysphoric mood. Negative for agitation, behavioral problems, confusion, decreased concentration, hallucinations, self-injury, sleep disturbance and suicidal ideas. The patient is nervous/anxious. The patient is not hyperactive.     Medications: I have reviewed the patient's current medications.  Current Outpatient Medications  Medication Sig Dispense Refill  . ALPRAZolam (XANAX) 0.5 MG tablet TAKE 1 TABLET(0.5 MG) BY MOUTH TWICE DAILY AS NEEDED FOR ANXIETY 60 tablet 1  . amphetamine-dextroamphetamine (ADDERALL) 30 MG tablet Take 0.5 tablets by mouth 2 (two) times daily. 90 tablet 0  . atorvastatin (LIPITOR) 10 MG tablet     . brimonidine (ALPHAGAN) 0.2 % ophthalmic solution Place 1 drop into both eyes 2 (two) times daily.     Marland Kitchen buPROPion (WELLBUTRIN XL) 150 MG 24 hr tablet TAKE 3 TABLETS(450 MG) BY MOUTH EVERY MORNING 270 tablet 3  . dorzolamide-timolol (COSOPT) 22.3-6.8 MG/ML ophthalmic solution Place 1 drop into both eyes 2 (two) times daily.    . fluticasone (FLONASE) 50 MCG/ACT nasal spray Place 2 sprays into both nostrils daily as needed for allergies.   2  . lamoTRIgine (LAMICTAL) 200 MG tablet TAKE 1  TABLET(200 MG) BY MOUTH DAILY 90 tablet 3  . latanoprost (XALATAN) 0.005 % ophthalmic solution Place 1 drop into both eyes at bedtime.    . pantoprazole (PROTONIX) 40 MG tablet Take 1 tablet (40 mg total) by mouth 2 (two) times daily before a meal. 60 tablet 5  . polyethylene glycol (MIRALAX / GLYCOLAX) 17 g packet Take 17 g by mouth daily. Taking PRN    . timolol (TIMOPTIC) 0.5 % ophthalmic solution 1 drop Two (2) times a day.    . traZODone (DESYREL) 50 MG tablet TAKE 1 TO 2 TABLETS(50 TO 100 MG) BY MOUTH AT BEDTIME AS NEEDED FOR SLEEP OR INSOMNIA 180 tablet 3  . vitamin B-12 (CYANOCOBALAMIN) 500 MCG tablet Take 500 mcg every evening by mouth.     No current facility-administered medications for this visit.    Medication Side Effects: None  Allergies:  Allergies  Allergen Reactions  . Ambien [Zolpidem Tartrate]     Sleep walks  . Bactrim [Sulfamethoxazole-Trimethoprim] Hives and Swelling  . Dilaudid [Hydromorphone Hcl] Other (See Comments)    Made blood pressure drop with colon resection surgery in 2007   . Morphine And Related Nausea And Vomiting    Past Medical History:  Diagnosis Date  . Anemia    hx of   . Arthritis    fingers   . BPH (benign prostatic hyperplasia)   . Colon cancer ([place])    stage II, diagnosed 2007  . Complication of anesthesia    vagal response after umbilcal hernia at [facility]  . Depression   . Diabetes mellitus    no as of 09/08/16 due to weight loss   . GERD (gastroesophageal reflux disease)   . Glaucoma 2012  . H. pylori infection 10/2015   Documented eradication March 2017  . History of kidney stones   . Sleep apnea    wears CiPaP at night  . Staph infection 2008   left side of face    Family History  Problem Relation Age of Onset  . Lung cancer Father   . Diabetes Mother   . Anesthesia problems Neg Hx   . Hypotension Neg Hx   . Malignant hyperthermia Neg Hx   . Pseudochol deficiency Neg Hx   . Colon cancer Neg Hx     Social  History   Socioeconomic History  . Marital status: Married    Spouse name: Not on file  . Number of children: 1  . Years of education: Not on file  . Highest education level: Not on file  Occupational History  . Occupation: school system    Employer: [employer]  Tobacco Use  . Smoking status: Never Smoker  . Smokeless tobacco: Never Used  . Tobacco comment: Never smoked  Vaping Use  . Vaping Use: Never used  Substance and Sexual Activity  . Alcohol use: No    Alcohol/week: 0.0 standard drinks  . Drug use: No  . Sexual activity: Yes    Birth control/protection: None  Other Topics Concern  . Not on file  Social History Narrative  . Not on file   Social Determinants of Health   Financial Resource Strain:   . Difficulty of Paying Living Expenses: Not on file  Food Insecurity:   . Worried About Charity fundraiser in the Last Year: Not on file  . Ran Out of Food in the Last Year: Not on file  Transportation Needs:   . Lack of Transportation (Medical): Not on file  . Lack of Transportation (Non-Medical): Not on file  Physical Activity:   . Days of Exercise per Week: Not on file  . Minutes of Exercise per Session: Not on file  Stress:   . Feeling of Stress : Not on file  Social Connections:   . Frequency of Communication with Friends and Family: Not on file  . Frequency of Social Gatherings with Friends and Family: Not on file  . Attends Religious Services: Not on file  . Active Member of Clubs or Organizations: Not on file  . Attends Archivist Meetings: Not on file  . Marital Status: Not on file  Intimate Partner Violence:   . Fear of Current or Ex-Partner: Not on file  . Emotionally Abused: Not on file  . Physically Abused: Not on file  . Sexually Abused: Not on file    Past  Medical History, Surgical history, Social history, and Family history were reviewed and updated as appropriate.   Please see review of systems for further details on the  patient's review from today.   Objective:   Physical Exam:  BP (!) 154/90   Pulse 66   Physical Exam Constitutional:      General: He is not in acute distress.    Appearance: He is well-developed.  Musculoskeletal:        General: No deformity.  Neurological:     Mental Status: He is alert and oriented to person, place, and time.     Cranial Nerves: No dysarthria.     Coordination: Coordination normal.  Psychiatric:        Attention and Perception: Perception normal. He is inattentive. He does not perceive auditory or visual hallucinations.        Mood and Affect: Mood is depressed. Mood is not anxious. Affect is not labile, blunt, angry or inappropriate.        Speech: Speech normal. Speech is not rapid and pressured.        Behavior: Behavior normal. Behavior is cooperative.        Thought Content: Thought content normal. Thought content is not paranoid or delusional. Thought content does not include homicidal or suicidal ideation. Thought content does not include homicidal or suicidal plan.        Cognition and Memory: Cognition and memory normal.        Judgment: Judgment normal.     Comments: Chronic dysthymia is unchanged.  He is not particularly anxious this time. Irritability is present but not too bad.     Lab Review:     Component Value Date/Time   NA 139 08/13/2019 1143   K 4.4 08/13/2019 1143   CL 103 08/13/2019 1143   CO2 28 08/13/2019 1143   GLUCOSE 89 08/13/2019 1143   BUN 14 08/13/2019 1143   CREATININE 0.90 12/11/2019 0832   CALCIUM 9.1 08/13/2019 1143   PROT 6.4 (L) 08/13/2019 1143   ALBUMIN 3.7 08/13/2019 1143   AST 17 08/13/2019 1143   ALT 13 08/13/2019 1143   ALKPHOS 79 08/13/2019 1143   BILITOT 0.7 08/13/2019 1143   GFRNONAA >60 08/13/2019 1143   GFRAA >60 08/13/2019 1143       Component Value Date/Time   WBC 4.6 08/13/2019 1143   RBC 4.76 08/13/2019 1143   HGB 14.1 08/13/2019 1143   HCT 42.6 08/13/2019 1143   PLT 149 (L) 08/13/2019 1143    MCV 89.5 08/13/2019 1143   MCH 29.6 08/13/2019 1143   MCHC 33.1 08/13/2019 1143   RDW 12.7 08/13/2019 1143   LYMPHSABS 0.7 07/31/2019 0235   MONOABS 0.9 07/31/2019 0235   EOSABS 0.0 07/31/2019 0235   BASOSABS 0.0 07/31/2019 0235    No results found for: POCLITH, LITHIUM   No results found for: PHENYTOIN, PHENOBARB, VALPROATE, CBMZ   Normal stress test.  No exercise.  Won't stick with diet. 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  He thinks now he has bipolar disorder.  He has failed multiple medications listed above.  Latuda 20 failed and he doesn't want to try 40 mg daily.  This is an option.  Supportive therapy on problems with home life.  Trying to motivate for the hobbies.    Xanax 0.5 mg bid prn anxiety. Using rarely for sleep.  It is worth noting that he was worse with irritability and edginess after reducing the Wellbutrin to 300 mg daily and better at 450 mg daily so we will continue Wellbutrin 450 mg daily.  Chronic attention problems remain and he is failed multiple ADD meds including off label things such as modafinil and Nuvigil .  His wife complains of his ADD symptoms as well.  He would like to try another medication to see as if it is helpful.  We discussed side effects in detail.  Consider low dose Adderall or off-label Aricept. Discussed potential benefits, risks, and side effects of stimulants with patient to include increased heart rate, palpitations, insomnia, increased anxiety, increased irritability, or decreased appetite.  Instructed patient to contact office if experiencing any significant tolerability issues. Adderall trial 15  BID  No further med changes  This appt was 30 mins.  FU 4-6 mos  Lynder Parents, MD, DFAPA   Please see After Visit Summary for patient specific instructions.  Future Appointments  Date Time Provider [place]  12/10/2020  1:30 PM Mahala Menghini, PA-C [facility]    No orders of the defined types were placed in this encounter.     -------------------------------

## 2020-08-30 IMAGING — DX ABDOMEN - 2 VIEW
3 series · 3 of 3 positions shown · non-contrast
Comparison: [facility] 10/05/2007.  CT Abdomen and Pelvis 02/25/2017. The

CLINICAL DATA: 68-year-old male with abdominal pain and bloating
with nausea vomiting.

EXAM:
ABDOMEN - 2 VIEW

[abdomen erect]
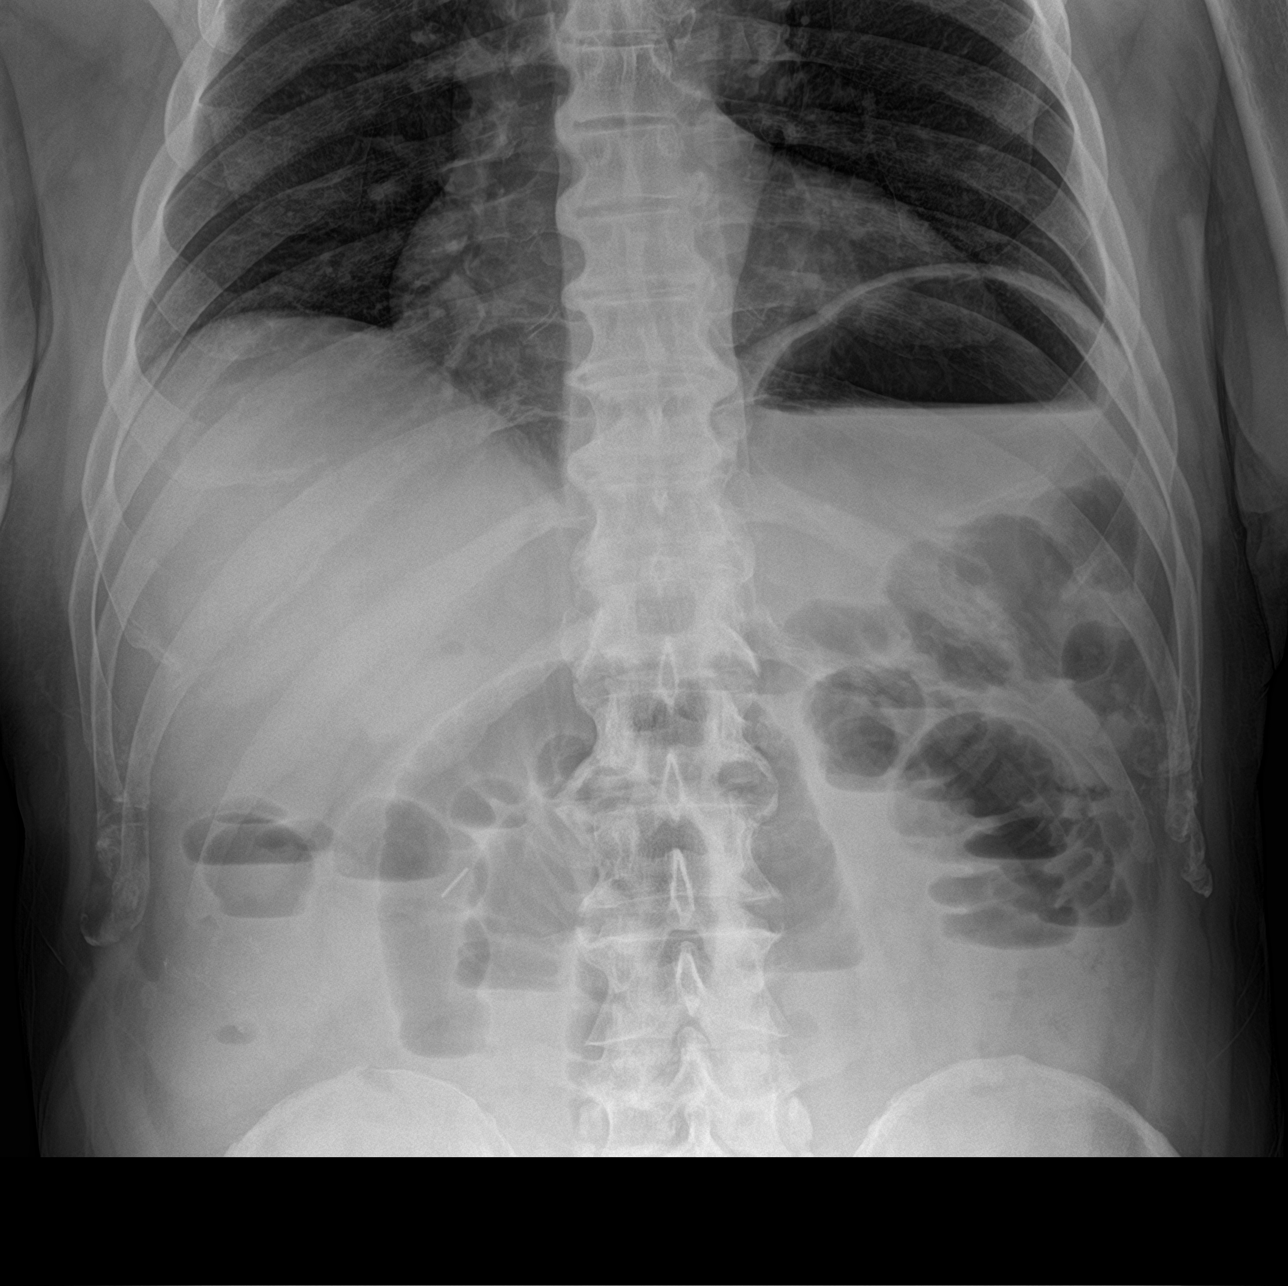

[abdomen supine (1 of 2)]
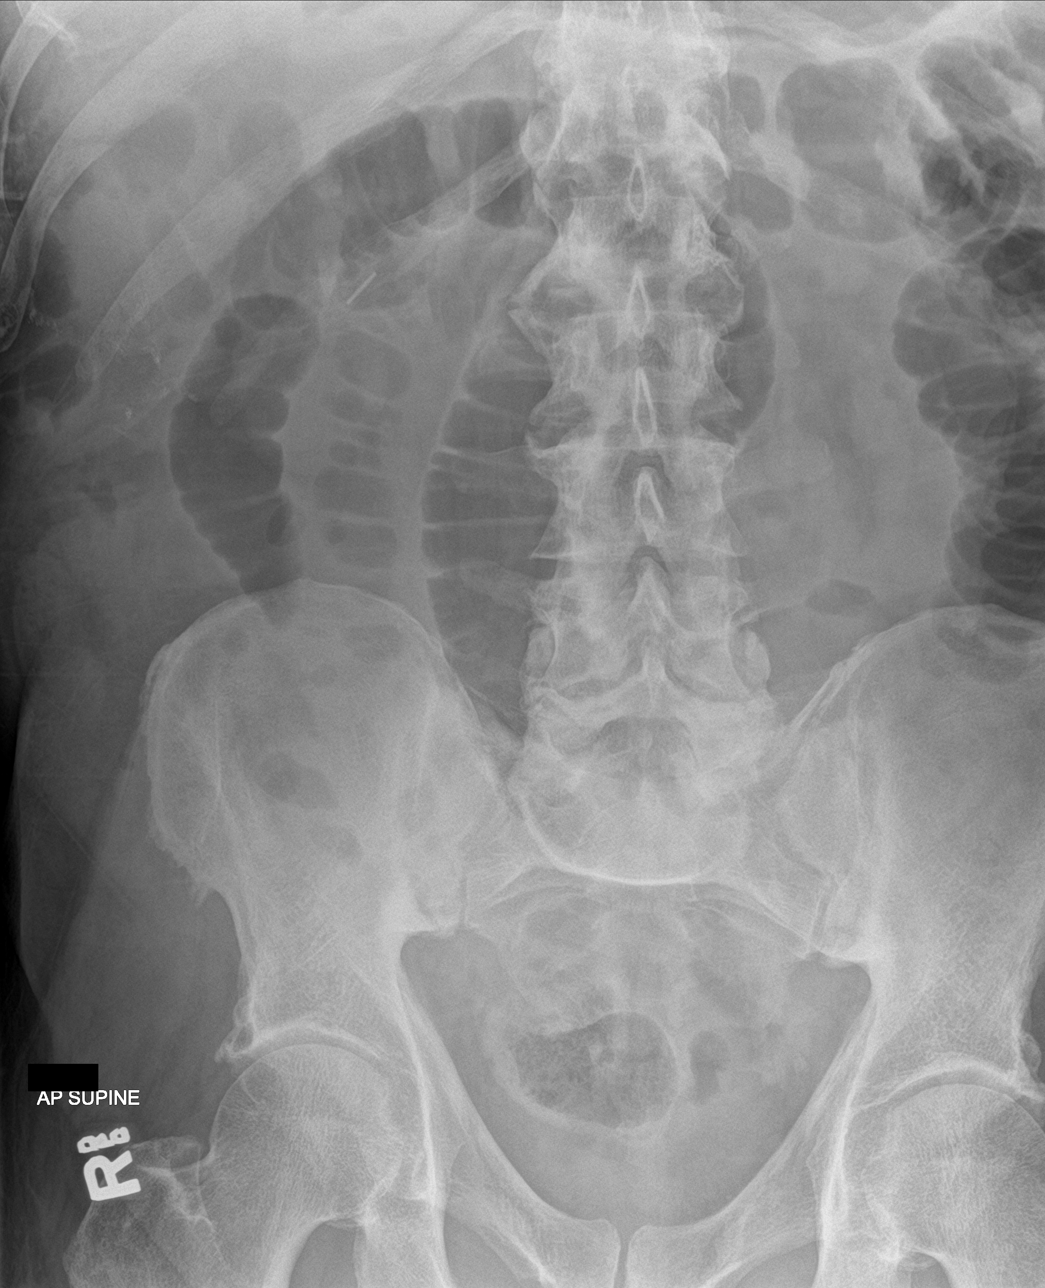

[abdomen supine (2 of 2)]
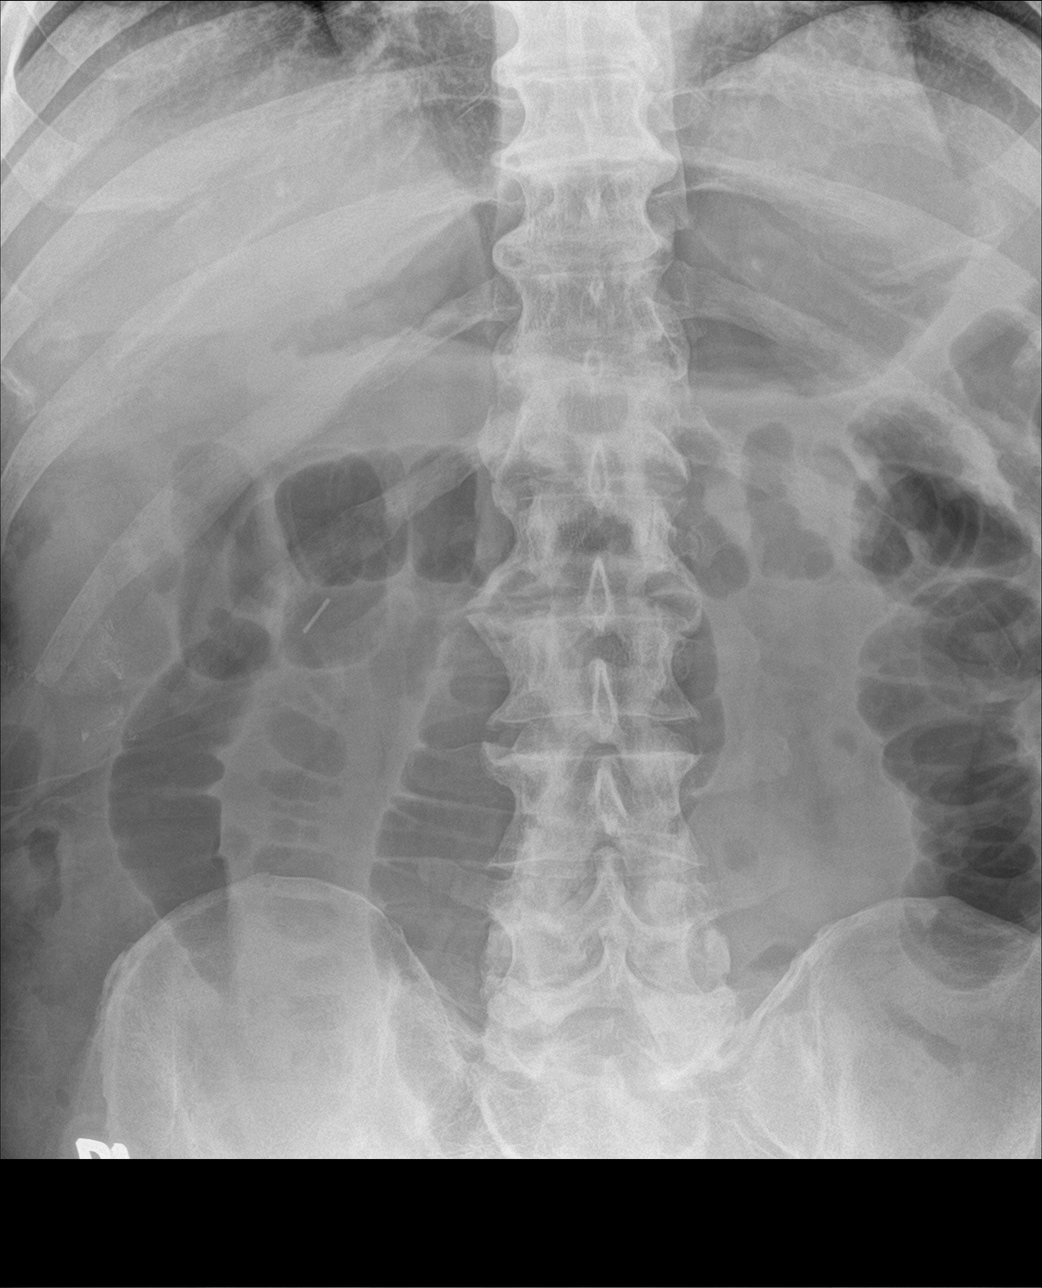

[3 of 3 positions shown; findings below may reference images not displayed]

FINDINGS: Upright and supine views. No pneumoperitoneum. Negative lung bases.

Air-fluid level in the stomach, and multiple small-bowel air-fluid
levels with small-bowel loops dilated up to 40-45 millimeters
diameter. Stable right abdominal surgical clips and staple line.

Paucity of large bowel gas. No acute osseous abnormality identified.
IMPRESSION: 1. Positive for small bowel obstruction. No free air.
2. Previous right hemicolectomy.

## 2020-09-01 IMAGING — CR ABDOMEN - 2 VIEW
1 series · 2 of 2 positions shown · non-contrast
Comparison: 07/31/2019

CLINICAL DATA: Acute abdominal pain with nausea/vomiting for
several days.

EXAM:
ABDOMEN - 2 VIEW

[Series 1: supine ap · 0.17mm/px · 2 of 2 slices shown]
[im 1/2]
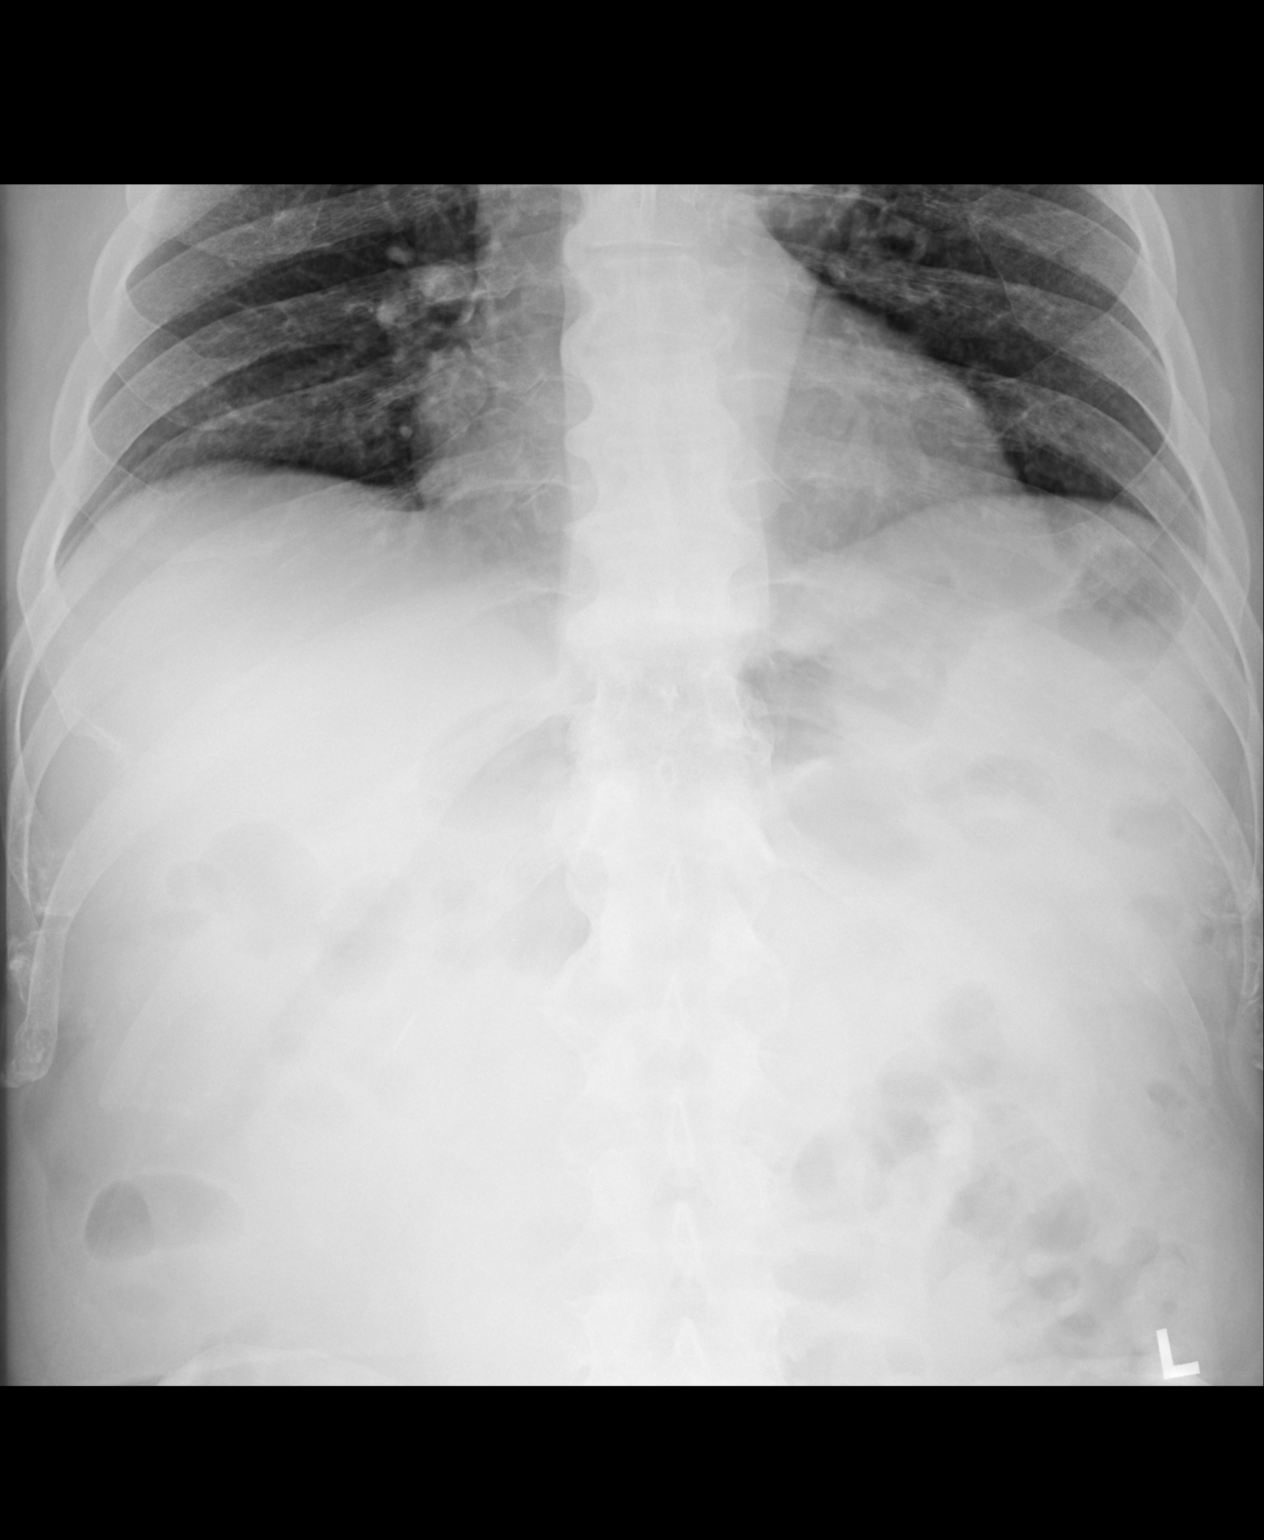
[im 2/2]
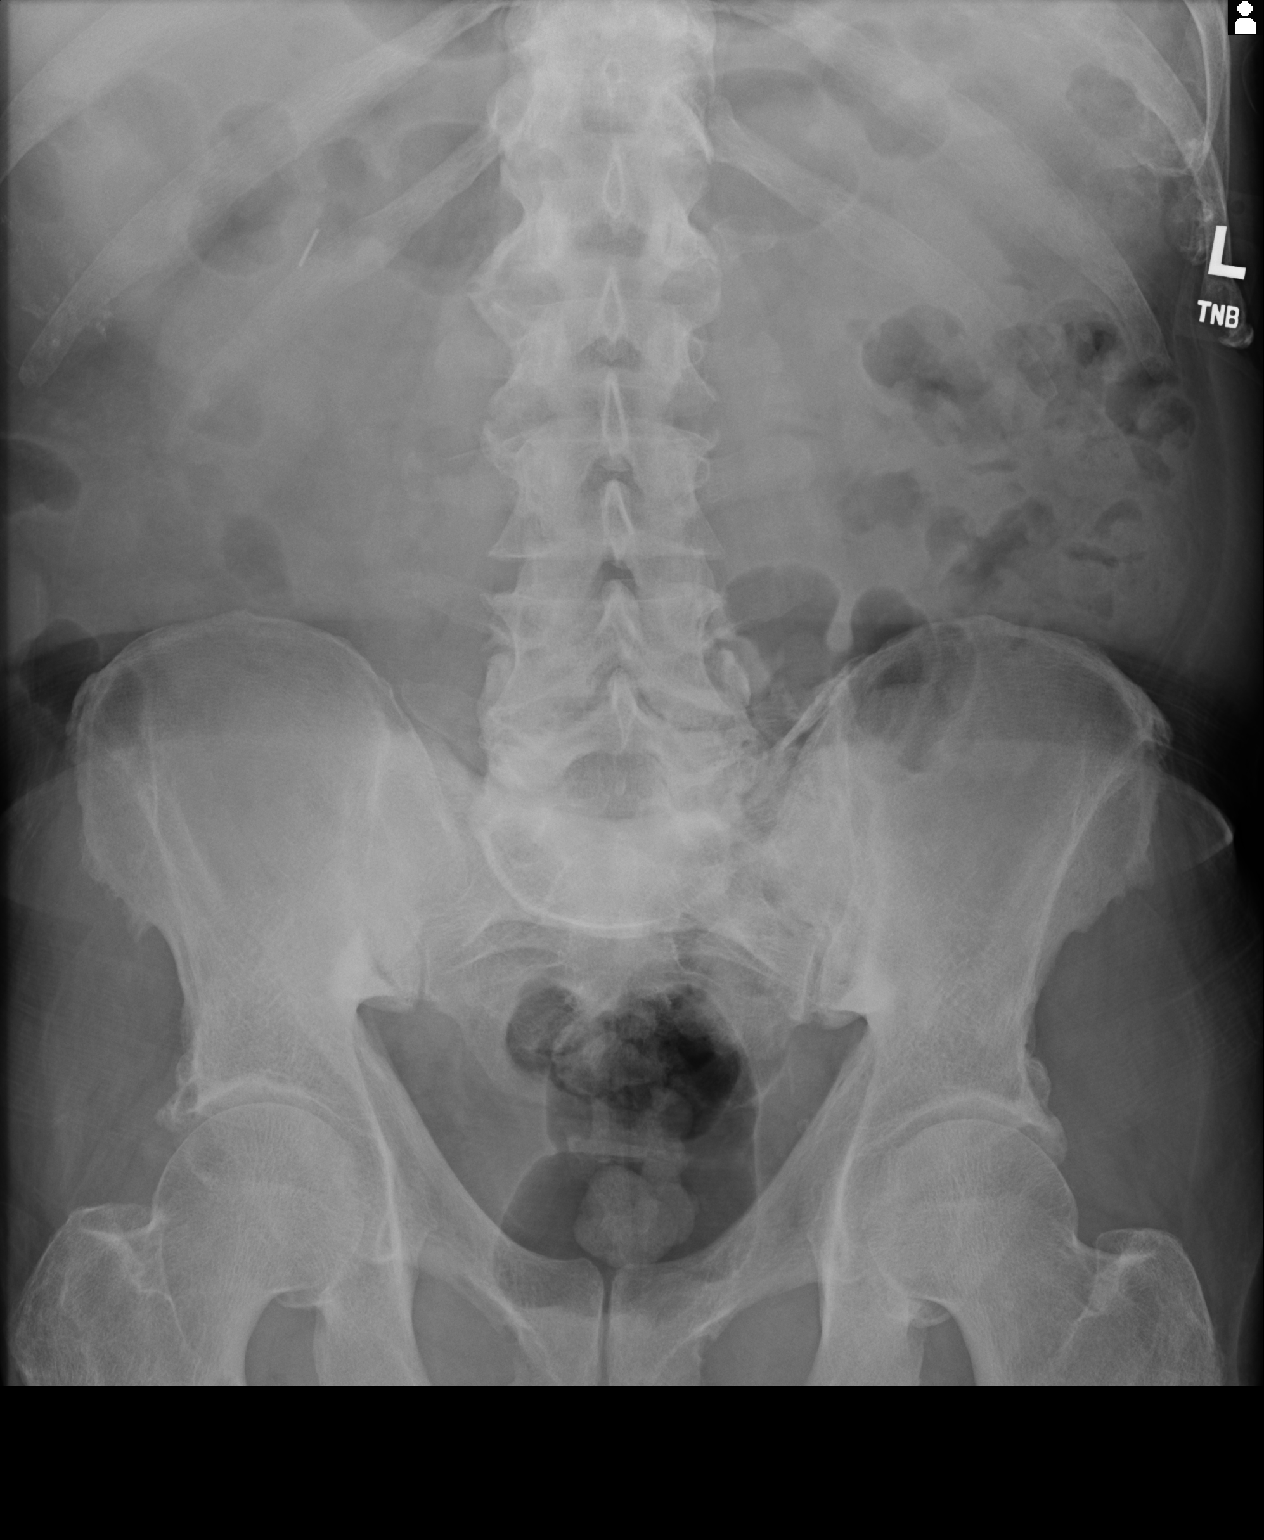

[2 of 2 positions shown; findings below may reference images not displayed]

FINDINGS: The bowel gas pattern is normal.  There is no evidence of free air.

Surgical material within the RIGHT abdomen again noted.

No acute bony abnormalities are present.
IMPRESSION: No acute abnormality.

## 2020-10-06 ENCOUNTER — Other Ambulatory Visit: Payer: Self-pay | Admitting: Psychiatry

## 2020-10-06 DIAGNOSIS — C260 Malignant neoplasm of intestinal tract, part unspecified: Secondary | ICD-10-CM | POA: Diagnosis not present

## 2020-10-06 DIAGNOSIS — E119 Type 2 diabetes mellitus without complications: Secondary | ICD-10-CM | POA: Diagnosis not present

## 2020-10-06 DIAGNOSIS — Z13220 Encounter for screening for lipoid disorders: Secondary | ICD-10-CM | POA: Diagnosis not present

## 2020-10-06 DIAGNOSIS — I10 Essential (primary) hypertension: Secondary | ICD-10-CM | POA: Diagnosis not present

## 2020-10-06 DIAGNOSIS — D518 Other vitamin B12 deficiency anemias: Secondary | ICD-10-CM | POA: Diagnosis not present

## 2020-10-06 DIAGNOSIS — Z85038 Personal history of other malignant neoplasm of large intestine: Secondary | ICD-10-CM | POA: Diagnosis not present

## 2020-10-06 DIAGNOSIS — F39 Unspecified mood [affective] disorder: Secondary | ICD-10-CM

## 2020-10-06 DIAGNOSIS — E039 Hypothyroidism, unspecified: Secondary | ICD-10-CM | POA: Diagnosis not present

## 2020-10-06 DIAGNOSIS — N401 Enlarged prostate with lower urinary tract symptoms: Secondary | ICD-10-CM | POA: Diagnosis not present

## 2020-10-08 DIAGNOSIS — Z0001 Encounter for general adult medical examination with abnormal findings: Secondary | ICD-10-CM | POA: Diagnosis not present

## 2020-10-08 DIAGNOSIS — Z23 Encounter for immunization: Secondary | ICD-10-CM | POA: Diagnosis not present

## 2020-10-08 DIAGNOSIS — R739 Hyperglycemia, unspecified: Secondary | ICD-10-CM | POA: Diagnosis not present

## 2020-10-08 DIAGNOSIS — Z1331 Encounter for screening for depression: Secondary | ICD-10-CM | POA: Diagnosis not present

## 2020-10-08 DIAGNOSIS — E538 Deficiency of other specified B group vitamins: Secondary | ICD-10-CM | POA: Diagnosis not present

## 2020-10-08 DIAGNOSIS — Z1389 Encounter for screening for other disorder: Secondary | ICD-10-CM | POA: Diagnosis not present

## 2020-10-08 DIAGNOSIS — N401 Enlarged prostate with lower urinary tract symptoms: Secondary | ICD-10-CM | POA: Diagnosis not present

## 2020-10-08 DIAGNOSIS — R0602 Shortness of breath: Secondary | ICD-10-CM | POA: Diagnosis not present

## 2020-10-26 ENCOUNTER — Other Ambulatory Visit: Payer: Self-pay | Admitting: Psychiatry

## 2020-10-26 DIAGNOSIS — F411 Generalized anxiety disorder: Secondary | ICD-10-CM

## 2020-10-27 DIAGNOSIS — Z1283 Encounter for screening for malignant neoplasm of skin: Secondary | ICD-10-CM | POA: Diagnosis not present

## 2020-10-27 DIAGNOSIS — I872 Venous insufficiency (chronic) (peripheral): Secondary | ICD-10-CM | POA: Diagnosis not present

## 2020-10-27 DIAGNOSIS — L570 Actinic keratosis: Secondary | ICD-10-CM | POA: Diagnosis not present

## 2020-10-27 DIAGNOSIS — D225 Melanocytic nevi of trunk: Secondary | ICD-10-CM | POA: Diagnosis not present

## 2020-10-27 DIAGNOSIS — X32XXXD Exposure to sunlight, subsequent encounter: Secondary | ICD-10-CM | POA: Diagnosis not present

## 2020-10-27 NOTE — Telephone Encounter (Signed)
Next apt 01/12/21

## 2020-11-16 DIAGNOSIS — R0602 Shortness of breath: Secondary | ICD-10-CM | POA: Diagnosis not present

## 2020-11-16 DIAGNOSIS — R5383 Other fatigue: Secondary | ICD-10-CM | POA: Diagnosis not present

## 2020-11-16 DIAGNOSIS — G4733 Obstructive sleep apnea (adult) (pediatric): Secondary | ICD-10-CM | POA: Diagnosis not present

## 2020-11-16 DIAGNOSIS — I10 Essential (primary) hypertension: Secondary | ICD-10-CM | POA: Diagnosis not present

## 2020-12-10 ENCOUNTER — Encounter: Payer: Self-pay | Admitting: Gastroenterology

## 2020-12-10 ENCOUNTER — Telehealth: Payer: Self-pay

## 2020-12-10 ENCOUNTER — Ambulatory Visit: Payer: Medicare PPO | Admitting: Gastroenterology

## 2020-12-10 ENCOUNTER — Other Ambulatory Visit: Payer: Self-pay

## 2020-12-10 VITALS — BP 146/78 | HR 55 | Temp 96.9°F | Ht 70.0 in | Wt 213.8 lb

## 2020-12-10 DIAGNOSIS — K760 Fatty (change of) liver, not elsewhere classified: Secondary | ICD-10-CM

## 2020-12-10 DIAGNOSIS — R059 Cough, unspecified: Secondary | ICD-10-CM | POA: Diagnosis not present

## 2020-12-10 DIAGNOSIS — K219 Gastro-esophageal reflux disease without esophagitis: Secondary | ICD-10-CM | POA: Diagnosis not present

## 2020-12-10 DIAGNOSIS — K639 Disease of intestine, unspecified: Secondary | ICD-10-CM | POA: Diagnosis not present

## 2020-12-10 DIAGNOSIS — K289 Gastrojejunal ulcer, unspecified as acute or chronic, without hemorrhage or perforation: Secondary | ICD-10-CM

## 2020-12-10 NOTE — Progress Notes (Signed)
Primary Care Physician: Rory Percy, MD  Primary Gastroenterologist:  Garfield Cornea, MD   Chief Complaint  Patient presents with  . Cough    X4 months    HPI: [name] is a 69 y.o. male here for follow-up.  Last seen in June 2021.  Patient has a history of stage II colon cancer with resection in 2007, fatty liver, constipation, GERD, questionable small bowel lesion on Givens capsule November 2020.  Also with history of known anastomotic ulcer seen on last colonoscopy.  See prior GI work-up as outlined below.  Patient presents today with complaints of cough for 4 months.  Symptoms occur mostly at night.  Has a history of GERD, minimal heartburn symptoms.  Weight is up 25 pounds in the past 1 year.  Symptoms occurred after his stepdad died.  He is also been less active.  He denies any associated shortness of breath.  He currently takes pantoprazole in the mornings.  Denies dysphagia.  No abdominal pain.  His bowel movements have been regular, couple times per day.  Generally [place] 3-4.  No melena or rectal bleeding.   Pertinent GI work-up:   EGD November 2016 with 2 to 3 cm submucosal, hard mass with an 8-9 millimeters area of central ulceration in the proximal esophagus. Salmon-colored epithelium also noted suspicious for Barrett's. No esophagitis. Gastric biopsy with chronic active gastritis with H pylori, subsequently treated (documented eradication in March 2017), esophagogastric junction biopsy gastric-type mucosa with mixed inflammation and lymphoid follicles. Esophageal mass biopsy benign, it was felt that biopsy may not be sufficient due to the approach.   EUS with Dr. Owens Loffler on 11/20/2015. At this time the EGD findings were entirely normal. EUS findings of the esophagus normal. He had a CT of the chest which was normal. Questionable mass related to GERD, edema resolved after starting PPI therapy?  EGD in November 2018 for nausea, completely normal.  Small  bowel obstruction in August 2020.  Colonoscopy October 2020: Diverticulosis in the sigmoid colon and descending colon, focal ulcerations in the distal neoterminal ileum s/p biopsy.  Recommended repeat colonoscopy in 5 years for surveillance.  Pathology revealed focal acute inflammation and granulation tissue consistent with ulcer.  No granulomas, adenomatous change, or malignancy.  Recommended Givens capsule to evaluate small bowel.   Givens capsule 11/12/2019: Scattered erosions throughout the small bowel.  Ulceration again seen at the anastomosis.  No active bleeding.  Suspected possible ischemia.  NSAIDs are possibility but patient denied.  Otherwise, nonspecific.  Could be early inflammatory bowel disease but findings are subtle.  Additionally, there was a questionable lesion found within the small bowel with recommendations to pursue CT for further evaluation.  Otherwise, recommended 42-month follow-up.  CTE 01/10/2020: No concerning lesions identified.  No small bowel mass, inflammation, obstruction, or stricturing.  Possible small bowel diverticulum without inflammation.  Dr. Gala Romney also reviewed study.  Stated we may have just simply seen a "incidentaloma" on Givens.  Could consider repeating the scan in 1 year.  No strong case for IBD.   Current Outpatient Medications  Medication Sig Dispense Refill  . ALPRAZolam (XANAX) 0.5 MG tablet TAKE 1 TABLET BY MOUTH TWICE DAILY AS NEEDED FOR ANXIETY 60 tablet 5  . atorvastatin (LIPITOR) 10 MG tablet Take 10 mg by mouth daily.    . brimonidine (ALPHAGAN) 0.2 % ophthalmic solution Place 1 drop into both eyes 2 (two) times daily.     Marland Kitchen buPROPion (WELLBUTRIN XL) 150 MG  24 hr tablet TAKE 3 TABLETS(450 MG) BY MOUTH EVERY MORNING 270 tablet 3  . dorzolamide-timolol (COSOPT) 22.3-6.8 MG/ML ophthalmic solution Place 1 drop into both eyes 2 (two) times daily.    . fluticasone (FLONASE) 50 MCG/ACT nasal spray Place 2 sprays into both nostrils daily as needed for  allergies.   2  . lamoTRIgine (LAMICTAL) 200 MG tablet TAKE 1 TABLET(200 MG) BY MOUTH DAILY 90 tablet 3  . latanoprost (XALATAN) 0.005 % ophthalmic solution Place 1 drop into both eyes at bedtime.    . pantoprazole (PROTONIX) 40 MG tablet Take 1 tablet (40 mg total) by mouth 2 (two) times daily before a meal. 60 tablet 5  . timolol (TIMOPTIC) 0.5 % ophthalmic solution 1 drop Two (2) times a day.    . traZODone (DESYREL) 50 MG tablet TAKE 1 TO 2 TABLETS(50 TO 100 MG) BY MOUTH AT BEDTIME AS NEEDED FOR SLEEP OR INSOMNIA 180 tablet 3  . vitamin B-12 (CYANOCOBALAMIN) 500 MCG tablet Take 500 mcg every evening by mouth.     No current facility-administered medications for this visit.    Allergies as of 12/10/2020 - Review Complete 12/10/2020  Allergen Reaction Noted  . Ambien [zolpidem tartrate]  09/12/2018  . Bactrim [sulfamethoxazole-trimethoprim] Hives and Swelling 09/08/2016  . Dilaudid [hydromorphone hcl] Other (See Comments) 09/08/2016  . Morphine and related Nausea And Vomiting 09/08/2016    ROS:  General: Negative for anorexia, weight loss, fever, chills, fatigue, weakness. ENT: Negative for hoarseness, difficulty swallowing , nasal congestion. CV: Negative for chest pain, angina, palpitations, dyspnea on exertion, peripheral edema.  Respiratory: Negative for dyspnea at rest, dyspnea on exertion,   sputum, wheezing.  See HPI GI: See history of present illness. GU:  Negative for dysuria, hematuria, urinary incontinence, urinary frequency, nocturnal urination.  Endo: Negative for unusual weight change.    Physical Examination:   BP (!) 146/78   Pulse (!) 55   Temp (!) 96.9 F (36.1 C) (Temporal)   Ht 5\' 10"  (1.778 m)   Wt 213 lb 12.8 oz (97 kg)   BMI 30.68 kg/m   General: Well-nourished, well-developed in no acute distress.  Eyes: No icterus. Mouth: masked Lungs: Clear to auscultation bilaterally.  Heart: Regular rate and rhythm, no murmurs rubs or gallops.  Abdomen: Bowel  sounds are normal, nontender, nondistended, no hepatosplenomegaly or masses, no abdominal bruits or hernia , no rebound or guarding.   Extremities: No lower extremity edema. No clubbing or deformities. Neuro: Alert and oriented x 4   Skin: Warm and dry, no jaundice.   Psych: Alert and cooperative, normal mood and affect.   Imaging Studies: No results found.  Impression/plan:  Pleasant 69 year old gentleman with history of stage II colon cancer with resection in 2007 with known anastomotic ulcer, GERD, small bowel lesion, constipation, fatty liver presenting for follow-up.  Anastomotic ulcer in the distal neoterminal ileum goes back several years.  Seen again on a colonoscopy in October 2020.  Pathology with no evidence of malignancy, showed focal acute inflammation and granulation tissue consistent with ulcer.  Givens capsule completed November 2020 showing scattered erosions throughout the small bowel and ulceration again noted at the anastomosis.  No active bleeding.  Patient denied significant NSAID use.  Query early inflammatory bowel disease versus ischemia with findings subtle and patient without any signs or symptoms.  CTE completed noting possible small bowel diverticulum without inflammation, consider repeat scanning in 1 year.  Patient denies any change in bowel habits, no postprandial abdominal pain  whatsoever.  No unintentional weight loss.  Check CRP, sed rate, CBC.  Plan for repeat CTE next month.  GERD with typical symptoms well controlled.  He is on pantoprazole 40 mg in the morning only.  Typically forgets evening dose.  Complains of 64-month history of nocturnal coughing which may be related to reflux.  Discussed with patient with increased to twice daily dosing, second dose before evening meal.  May take several weeks to notice any significant improvement in months for complete resolution.  If no significant improvement he should consider work-up with PCP for other etiology for  chronic nocturnal cough.  Fatty liver.  Reports recent labs from PCP.  Requested for review.  Constipation well managed with MiraLAX as needed.

## 2020-12-10 NOTE — Telephone Encounter (Signed)
CTE abd/pelvis scheduled for 01/05/21 at 9:00am, arrive at 7:30am. NPO 4 hours prior to test.  Tried to call pt, no answer, LMOVM to let him know I would send MyChart message to inform him of appt.  Letter mailed and MyChart message sent.  Will start PA after OV note is completed.

## 2020-12-10 NOTE — Patient Instructions (Signed)
1. Take pantoprazole 40mg  30 minutes before breakfast and 30 minutes before your evening meal. 2. I will request a copy of labs from PCP for review. 3. We also need to check kidney function, inflammatory markers (due to ulceration noted on prior capsule study and colonoscopy).  Please go to [facility] for labs at least 3 days prior to scheduled CT. 4. CT enterography as scheduled.

## 2020-12-11 NOTE — Telephone Encounter (Signed)
Pt read MyChart message.

## 2020-12-16 ENCOUNTER — Encounter: Payer: Self-pay | Admitting: Gastroenterology

## 2020-12-16 ENCOUNTER — Telehealth: Payer: Self-pay | Admitting: Gastroenterology

## 2020-12-16 NOTE — Telephone Encounter (Signed)
Labs from 10/06/20: WBC 5900, H/H 15.6/45.2, platelets 162000 BUN 16, glucose 90, Creatinine 1.03, sodium 139, albumin 4.1, Tbili 0.5, AP 106, AST 19, ALT 15 A1C 5.6 CEA 1.7 TSH 2.970  LFTs normal. H/o fatty liver.  Await CTE.

## 2020-12-17 ENCOUNTER — Telehealth: Payer: Self-pay

## 2020-12-17 DIAGNOSIS — H401132 Primary open-angle glaucoma, bilateral, moderate stage: Secondary | ICD-10-CM | POA: Diagnosis not present

## 2020-12-17 NOTE — Telephone Encounter (Signed)
PA for CTE submitted via [employer] website. Case approved. [employer]# [id number], valid 01/05/21-02/04/21.

## 2020-12-30 DIAGNOSIS — K639 Disease of intestine, unspecified: Secondary | ICD-10-CM | POA: Diagnosis not present

## 2020-12-30 DIAGNOSIS — K219 Gastro-esophageal reflux disease without esophagitis: Secondary | ICD-10-CM | POA: Diagnosis not present

## 2020-12-30 DIAGNOSIS — K289 Gastrojejunal ulcer, unspecified as acute or chronic, without hemorrhage or perforation: Secondary | ICD-10-CM | POA: Diagnosis not present

## 2020-12-30 DIAGNOSIS — R059 Cough, unspecified: Secondary | ICD-10-CM | POA: Diagnosis not present

## 2020-12-30 DIAGNOSIS — K760 Fatty (change of) liver, not elsewhere classified: Secondary | ICD-10-CM | POA: Diagnosis not present

## 2020-12-31 DIAGNOSIS — H9319 Tinnitus, unspecified ear: Secondary | ICD-10-CM | POA: Diagnosis not present

## 2020-12-31 DIAGNOSIS — Z6830 Body mass index (BMI) 30.0-30.9, adult: Secondary | ICD-10-CM | POA: Diagnosis not present

## 2020-12-31 DIAGNOSIS — H9190 Unspecified hearing loss, unspecified ear: Secondary | ICD-10-CM | POA: Diagnosis not present

## 2020-12-31 LAB — CBC WITH DIFFERENTIAL/PLATELET
Basophils Absolute: 0 10*3/uL (ref 0.0–0.2)
Basos: 0 %
EOS (ABSOLUTE): 0.1 10*3/uL (ref 0.0–0.4)
Eos: 2 %
Hematocrit: 46.9 % (ref 37.5–51.0)
Hemoglobin: 16 g/dL (ref 13.0–17.7)
Immature Grans (Abs): 0 10*3/uL (ref 0.0–0.1)
Immature Granulocytes: 0 %
Lymphocytes Absolute: 1.2 10*3/uL (ref 0.7–3.1)
Lymphs: 24 %
MCH: 30.6 pg (ref 26.6–33.0)
MCHC: 34.1 g/dL (ref 31.5–35.7)
MCV: 90 fL (ref 79–97)
Monocytes Absolute: 0.5 10*3/uL (ref 0.1–0.9)
Monocytes: 11 %
Neutrophils Absolute: 3.3 10*3/uL (ref 1.4–7.0)
Neutrophils: 63 %
Platelets: 158 10*3/uL (ref 150–450)
RBC: 5.23 x10E6/uL (ref 4.14–5.80)
RDW: 13.3 % (ref 11.6–15.4)
WBC: 5.1 10*3/uL (ref 3.4–10.8)

## 2020-12-31 LAB — BASIC METABOLIC PANEL
BUN/Creatinine Ratio: 18 (ref 10–24)
BUN: 19 mg/dL (ref 8–27)
CO2: 27 mmol/L (ref 20–29)
Calcium: 9.4 mg/dL (ref 8.6–10.2)
Chloride: 100 mmol/L (ref 96–106)
Creatinine, Ser: 1.03 mg/dL (ref 0.76–1.27)
GFR calc Af Amer: 86 mL/min/{1.73_m2} (ref >59)
GFR calc non Af Amer: 74 mL/min/{1.73_m2} (ref >59)
Glucose: 164 mg/dL — ABNORMAL HIGH (ref 65–99)
Potassium: 4.4 mmol/L (ref 3.5–5.2)
Sodium: 141 mmol/L (ref 134–144)

## 2020-12-31 LAB — SEDIMENTATION RATE: Sed Rate: 2 mm/hr (ref 0–30)

## 2020-12-31 LAB — C-REACTIVE PROTEIN: CRP: 2 mg/L (ref 0–10)

## 2021-01-05 ENCOUNTER — Ambulatory Visit (HOSPITAL_COMMUNITY): Admission: RE | Admit: 2021-01-05 | Payer: Medicare PPO | Source: Ambulatory Visit

## 2021-01-08 DIAGNOSIS — H9313 Tinnitus, bilateral: Secondary | ICD-10-CM | POA: Diagnosis not present

## 2021-01-08 DIAGNOSIS — H903 Sensorineural hearing loss, bilateral: Secondary | ICD-10-CM | POA: Diagnosis not present

## 2021-01-12 ENCOUNTER — Other Ambulatory Visit: Payer: Self-pay

## 2021-01-12 ENCOUNTER — Ambulatory Visit (INDEPENDENT_AMBULATORY_CARE_PROVIDER_SITE_OTHER): Payer: Medicare PPO | Admitting: Psychiatry

## 2021-01-12 ENCOUNTER — Encounter: Payer: Self-pay | Admitting: Psychiatry

## 2021-01-12 DIAGNOSIS — Z630 Problems in relationship with spouse or partner: Secondary | ICD-10-CM | POA: Diagnosis not present

## 2021-01-12 DIAGNOSIS — F411 Generalized anxiety disorder: Secondary | ICD-10-CM

## 2021-01-12 DIAGNOSIS — F341 Dysthymic disorder: Secondary | ICD-10-CM | POA: Diagnosis not present

## 2021-01-12 DIAGNOSIS — F39 Unspecified mood [affective] disorder: Secondary | ICD-10-CM

## 2021-01-12 DIAGNOSIS — F902 Attention-deficit hyperactivity disorder, combined type: Secondary | ICD-10-CM

## 2021-01-12 MED ORDER — LAMOTRIGINE 100 MG PO TABS: ORAL_TABLET | ORAL | 0 refills | Status: DC

## 2021-01-12 NOTE — Patient Instructions (Signed)
Reduce lamotrigine to 1 and 1/2 of the 100 mg tablets for 4 weeks, Then reduce lamotrigine to 1 of the tablets for 4 weeks,  Then reduce lamotrigine to 1/2 tablets for 2 weeks,  Then stop lamotrigine

## 2021-01-12 NOTE — Progress Notes (Signed)
[name] [id number] 07-24-1951 70 y.o.     Subjective:   Patient ID:  [name] is a 70 y.o. (DOB 1951-11-27) male.  Chief Complaint:  Chief Complaint  Patient presents with  . Follow-up  . Depression  . Stress  . ADHD  . Family Problem    Depression        Associated symptoms include no decreased concentration and no suicidal ideas.  [name] presents for follow-up of depression and medication changes.  At visit February 12, 2019.  He had become more depressed off of the [name] and it was recommended he restart [name] 1.5 mg daily.  He also had previously felt edgy and so we reduced the Wellbutrin from 450 to 300 mg daily in hopes of cutting down on that edgy feeling. He felt edgier after reducing the Wellbutrin to 300 so increased it back.    visit May 2020.  [name] was increased to 1.5 mg daily to try to help more consistently with depression and anxiety.   visit October 24, 2019.  [name] was stopped because it had no beneficial effect.  Adderall was added.   visit February 06, 2020 the following was noted: My highs are higher and lows are lower. Started noticing this more since January.   At times more irritable and needs Xanax.  Off Adderall for 2 mos DT NR.  Last 6 weeks had [name] more alone without wife.  Has a good time with [name].  Gets frustrated that wife complains a lot over what he does.  Don't lose my cool anywhere except at home.  Distracted big time.  Mind wanders.  Nothing is changed at home.  Has talked with wife about it.  They are both their for [name] only.  Unhappy with marriage.  I hate to go out of the world like this.   Nothing I can do about it. Lost 50# and feels good about it.  Has walked a lot and cut back eating.  Step dad died and helps with mother. More irritable lately ? Related to stopping the [name]. Call back if any symptoms are worse. In February 2021 the following was started: Zoloft trial for irritability and anxiety.   25 mg for 7 day then 50 mg daily.  As of 04/01/20 the following noted: Wife irritable and not feeling well and hasn't noticed any effect of his starting Zoloft.  She gets mad over [name]'s care by pt. I've seen a little difference in calmer in situations at home with Zoloft 50.  No SE.  1 cup coffee and a drink a day.  Trazodone still helps sleep.    06/13/20 appt with the following noted: Adderall helping productivity and focus.  Wife notices Out of sertraline.  Couldn't tell a lot of difference with the Zoloft but not sure. No SE Adderall. I know I'm bipolar bc can get on a high and drop to a low.  Can be hyperverbal, hyper energetic.  Wife says he's stuck in the past. Wishes he could get rid of depression.  Can enjoy some things. Plan: DC sertraline bc minimal effect. Latuda 20 mg suggested. Take Latuda with at least 350 calories. Take 20 mg daily for 4 weeks. If no effect then increase to 40 mg daily.  08/18/20 appt with the following noted: No change with Latuda 20 that was any better and stopped it.  Not sure he took 40 mg daily. Seems like he's busier than he's been but also some  free time.  Taking care of Mo.  Cuts grass for 2 days.  Went to [name] to visit someone this weekend. Stress wife afraid constantly of Covid and he has to wear mask in his own house. Sleep better with trazodone. Plan: Adderall trial 15 BID  01/12/2021 appointment noted: Adderall not helpful and stopped. Past being concerned about [name]'s comments about his focus.  Getting older. Stayed on Wellbutrin 450, lamotrigine 200, trazodone 100 HS.   No SE. Stress aunt driving him crazy but she's good to him.  Handled Xmas OK. Still wonders at times about bipolar bc irritability.  Pt reports that mood is Anxious, Depressed, Dysphoric and Irritable but still trying to figure it out. Not a lot to do.  No friends to do things with.  Routine of a loner.  Describes anxiety as Moderate. Anxiety symptoms include: Excessive  Worry,. Pt reports no sleep issues. Pt reports that appetite is good. Pt reports that energy is lethargic and down slightly and loss of interest or pleasure in usual activities. Concentration is difficulty with focus and attention. Suicidal thoughts:  denied by patient.  Brain doesn't shut off.  .  Hard to push himself into hobbies too.  Unselttl;ed.  Bored in retirement and lonely at home.  W preoccupied and consumed with developmentally disabled son [name] and ignores pt.  Even harder time keeping [name] and managing his emotions about it.  Some rumination and guilt on his thoughts.  Past Psychiatric Medication Trials: [name] 1.5, Abilify side effects in 2012, risperidone,  paroxetine, venlafaxine,  nortriptyline, duloxetine,  Pristiq, amitriptyline,, lithium, lamotrigine, [name] no response Latuda 20 for 3 weeks NR Ambien cognitive side effects, Lunesta cognitive side effects, , trazodone, Xanax, buspirone Eve[place]o, Nuvigil, Concerta, Vyvanse, Adderall  Review of Systems:  Review of Systems  HENT: Positive for hearing loss.   Musculoskeletal: Positive for back pain.  Neurological: Negative for dizziness, tremors and weakness.  Psychiatric/Behavioral: Positive for depression and dysphoric mood. Negative for agitation, behavioral problems, confusion, decreased concentration, hallucinations, self-injury, sleep disturbance and suicidal ideas. The patient is nervous/anxious. The patient is not hyperactive.     Medications: I have reviewed the patient's current medications.  Current Outpatient Medications  Medication Sig Dispense Refill  . ALPRAZolam (XANAX) 0.5 MG tablet TAKE 1 TABLET BY MOUTH TWICE DAILY AS NEEDED FOR ANXIETY 60 tablet 5  . atorvastatin (LIPITOR) 10 MG tablet Take 10 mg by mouth daily.    . brimonidine (ALPHAGAN) 0.2 % ophthalmic solution Place 1 drop into both eyes 2 (two) times daily.     Marland Kitchen buPROPion (WELLBUTRIN XL) 150 MG 24 hr tablet TAKE 3 TABLETS(450 MG) BY MOUTH EVERY  MORNING 270 tablet 3  . dorzolamide-timolol (COSOPT) 22.3-6.8 MG/ML ophthalmic solution Place 1 drop into both eyes 2 (two) times daily.    . fluticasone (FLONASE) 50 MCG/ACT nasal spray Place 2 sprays into both nostrils daily as needed for allergies.   2  . latanoprost (XALATAN) 0.005 % ophthalmic solution Place 1 drop into both eyes at bedtime.    . pantoprazole (PROTONIX) 40 MG tablet Take 1 tablet (40 mg total) by mouth 2 (two) times daily before a meal. 60 tablet 5  . timolol (TIMOPTIC) 0.5 % ophthalmic solution 1 drop Two (2) times a day.    . traZODone (DESYREL) 50 MG tablet TAKE 1 TO 2 TABLETS(50 TO 100 MG) BY MOUTH AT BEDTIME AS NEEDED FOR SLEEP OR INSOMNIA 180 tablet 3  . vitamin B-12 (CYANOCOBALAMIN) 500 MCG tablet Take 500  mcg every evening by mouth.    . lamoTRIgine (LAMICTAL) 100 MG tablet 1 and 1/2 of the tablets daily 135 tablet 0   No current facility-administered medications for this visit.    Medication Side Effects: None  Allergies:  Allergies  Allergen Reactions  . Ambien [Zolpidem Tartrate]     Sleep walks  . Bactrim [Sulfamethoxazole-Trimethoprim] Hives and Swelling  . Dilaudid [Hydromorphone Hcl] Other (See Comments)    Made blood pressure drop with colon resection surgery in 2007   . Morphine And Related Nausea And Vomiting    Past Medical History:  Diagnosis Date  . Anemia    hx of   . Arthritis    fingers   . BPH (benign prostatic hyperplasia)   . Colon cancer ([place])    stage II, diagnosed 2007  . Complication of anesthesia    vagal response after umbilcal hernia at [facility]  . Depression   . Diabetes mellitus    no as of 09/08/16 due to weight loss   . GERD (gastroesophageal reflux disease)   . Glaucoma 2012  . H. pylori infection 10/2015   Documented eradication March 2017  . History of kidney stones   . Sleep apnea    wears CiPaP at night  . Staph infection 2008   left side of face    Family History  Problem Relation Age of Onset  .  Lung cancer Father   . Diabetes Mother   . Anesthesia problems Neg Hx   . Hypotension Neg Hx   . Malignant hyperthermia Neg Hx   . Pseudochol deficiency Neg Hx   . Colon cancer Neg Hx     Social History   Socioeconomic History  . Marital status: Married    Spouse name: Not on file  . Number of children: 1  . Years of education: Not on file  . Highest education level: Not on file  Occupational History  . Occupation: school system    Employer: [employer]  Tobacco Use  . Smoking status: Never Smoker  . Smokeless tobacco: Never Used  . Tobacco comment: Never smoked  Vaping Use  . Vaping Use: Never used  Substance and Sexual Activity  . Alcohol use: No    Alcohol/week: 0.0 standard drinks  . Drug use: No  . Sexual activity: Yes    Birth control/protection: None  Other Topics Concern  . Not on file  Social History Narrative  . Not on file   Social Determinants of Health   Financial Resource Strain: Not on file  Food Insecurity: Not on file  Transportation Needs: Not on file  Physical Activity: Not on file  Stress: Not on file  Social Connections: Not on file  Intimate Partner Violence: Not on file    Past Medical History, Surgical history, Social history, and Family history were reviewed and updated as appropriate.   Please see review of systems for further details on the patient's review from today.   Objective:   Physical Exam:  There were no vitals taken for this visit.  Physical Exam Constitutional:      General: He is not in acute distress.    Appearance: He is well-developed.  Musculoskeletal:        General: No deformity.  Neurological:     Mental Status: He is alert and oriented to person, place, and time.     Cranial Nerves: No dysarthria.     Coordination: Coordination normal.  Psychiatric:  Attention and Perception: Perception normal. He is inattentive. He does not perceive auditory or visual hallucinations.        Mood and  Affect: Mood is depressed. Mood is not anxious. Affect is not labile, blunt, angry or inappropriate.        Speech: Speech normal. Speech is not rapid and pressured.        Behavior: Behavior normal. Behavior is cooperative.        Thought Content: Thought content normal. Thought content is not paranoid or delusional. Thought content does not include homicidal or suicidal ideation. Thought content does not include homicidal or suicidal plan.        Cognition and Memory: Cognition and memory normal.        Judgment: Judgment normal.     Comments: Chronic dysthymia is unchanged.  He is not particularly anxious this time. Irritability is present but not too bad.     Lab Review:     Component Value Date/Time   NA 141 12/30/2020 1137   K 4.4 12/30/2020 1137   CL 100 12/30/2020 1137   CO2 27 12/30/2020 1137   GLUCOSE 164 (H) 12/30/2020 1137   GLUCOSE 89 08/13/2019 1143   BUN 19 12/30/2020 1137   CREATININE 1.03 12/30/2020 1137   CREATININE 0.90 12/11/2019 0832   CALCIUM 9.4 12/30/2020 1137   PROT 6.4 (L) 08/13/2019 1143   ALBUMIN 3.7 08/13/2019 1143   AST 17 08/13/2019 1143   ALT 13 08/13/2019 1143   ALKPHOS 79 08/13/2019 1143   BILITOT 0.7 08/13/2019 1143   GFRNONAA 74 12/30/2020 1137   GFRAA 86 12/30/2020 1137       Component Value Date/Time   WBC 5.1 12/30/2020 1137   WBC 4.6 08/13/2019 1143   RBC 5.23 12/30/2020 1137   RBC 4.76 08/13/2019 1143   HGB 16.0 12/30/2020 1137   HCT 46.9 12/30/2020 1137   PLT 158 12/30/2020 1137   MCV 90 12/30/2020 1137   MCH 30.6 12/30/2020 1137   MCH 29.6 08/13/2019 1143   MCHC 34.1 12/30/2020 1137   MCHC 33.1 08/13/2019 1143   RDW 13.3 12/30/2020 1137   LYMPHSABS 1.2 12/30/2020 1137   MONOABS 0.9 07/31/2019 0235   EOSABS 0.1 12/30/2020 1137   BASOSABS 0.0 12/30/2020 1137    No results found for: POCLITH, LITHIUM   No results found for: PHENYTOIN, PHENOBARB, VALPROATE, CBMZ   Normal stress test.  No exercise.  Won't stick with  diet. 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  He thinks now he has bipolar disorder.  He has failed multiple medications listed above.  Latuda 20 failed and he doesn't want to try 40 mg daily.  This is an option.  Supportive therapy on problems with home life.  Trying to motivate for the hobbies.    Xanax 0.5 mg bid prn anxiety. Using rarely for sleep.  It is worth noting that he was worse with irritability and edginess after reducing the Wellbutrin to 300 mg daily and better at 450 mg daily so we will continue Wellbutrin 450 mg daily.  Chronic attention problems remain and he is failed multiple ADD meds including off label things such as modafinil and Nuvigil .  His wife complains of his ADD symptoms as well.  Had repeat of extensive discussion about whether or not he has bipolar.  He wonders if the lamotrigine is doing anything. Reduce lamotrigine to 1 and 1/2 of the 100 mg tablets for 4 weeks, Then reduce lamotrigine to 1 of the tablets for 4 weeks,  Then reduce lamotrigine to 1/2 tablets for 2 weeks,  Then stop lamotrigine  This appt was 30 mins.  FU 4-6 mos  Lynder Parents, MD, DFAPA   Please see After Visit Summary for patient specific instructions.  Future Appointments  Date Time Provider [place]  01/28/2021 10:00 AM [facility] 1 [facility] [place] [facility]  02/12/2021 10:45 AM Irine Seal, MD [facility] None    No orders of the defined types were placed in this encounter.     -------------------------------

## 2021-01-22 ENCOUNTER — Ambulatory Visit: Payer: Medicare PPO | Admitting: Urology

## 2021-01-22 DIAGNOSIS — H6691 Otitis media, unspecified, right ear: Secondary | ICD-10-CM | POA: Diagnosis not present

## 2021-01-28 ENCOUNTER — Other Ambulatory Visit: Payer: Self-pay

## 2021-01-28 ENCOUNTER — Ambulatory Visit (HOSPITAL_COMMUNITY)
Admission: RE | Admit: 2021-01-28 | Discharge: 2021-01-28 | Disposition: A | Discharge status: Home / Self Care | Payer: Medicare PPO | Source: Ambulatory Visit | Attending: Gastroenterology | Admitting: Gastroenterology

## 2021-01-28 DIAGNOSIS — M47814 Spondylosis without myelopathy or radiculopathy, thoracic region: Secondary | ICD-10-CM | POA: Diagnosis not present

## 2021-01-28 DIAGNOSIS — R059 Cough, unspecified: Secondary | ICD-10-CM | POA: Diagnosis not present

## 2021-01-28 DIAGNOSIS — K219 Gastro-esophageal reflux disease without esophagitis: Secondary | ICD-10-CM | POA: Insufficient documentation

## 2021-01-28 DIAGNOSIS — K289 Gastrojejunal ulcer, unspecified as acute or chronic, without hemorrhage or perforation: Secondary | ICD-10-CM | POA: Diagnosis not present

## 2021-01-28 DIAGNOSIS — K760 Fatty (change of) liver, not elsewhere classified: Secondary | ICD-10-CM | POA: Insufficient documentation

## 2021-01-28 DIAGNOSIS — K639 Disease of intestine, unspecified: Secondary | ICD-10-CM | POA: Insufficient documentation

## 2021-01-28 DIAGNOSIS — M4316 Spondylolisthesis, lumbar region: Secondary | ICD-10-CM | POA: Diagnosis not present

## 2021-01-28 DIAGNOSIS — N281 Cyst of kidney, acquired: Secondary | ICD-10-CM | POA: Diagnosis not present

## 2021-01-28 DIAGNOSIS — M47816 Spondylosis without myelopathy or radiculopathy, lumbar region: Secondary | ICD-10-CM | POA: Diagnosis not present

## 2021-01-28 MED ORDER — IOHEXOL 300 MG/ML  SOLN
125.0000 mL | Freq: Once | INTRAMUSCULAR | Status: AC | PRN
  Administered 2021-01-28: 125 mL via INTRAVENOUS

## 2021-02-10 NOTE — Progress Notes (Signed)
Subjective:  1. Benign prostatic hyperplasia with urinary obstruction   2. Nocturia      [name] is a 70 year-old male established patient who is here for follow up regarding further evaluation of BPH and lower urinary tract symptoms.  [name] returns in f/u.   He had a TURP on 09/09/16. He had some issues with SUI but that resolved with PT/OT and he is doing well.  He has had no dysuria or hematuria.  He had a recent CT AP that showed a benign left renal cyst and some prostate enlargement.  He has a history of stones but none seen on the recent scan. His UA is clear today.  He has not had a PSA.  He has no associated signs or symptoms.     ROS:  ROS:  A complete review of systems was performed.  All systems are negative except for pertinent findings as noted.   ROS  Allergies  Allergen Reactions  . Ambien [Zolpidem Tartrate]     Sleep walks  . Bactrim [Sulfamethoxazole-Trimethoprim] Hives and Swelling  . Dilaudid [Hydromorphone Hcl] Other (See Comments)    Made blood pressure drop with colon resection surgery in 2007   . Morphine And Related Nausea And Vomiting    Outpatient Encounter Medications as of 02/12/2021  Medication Sig  . ALPRAZolam (XANAX) 0.5 MG tablet TAKE 1 TABLET BY MOUTH TWICE DAILY AS NEEDED FOR ANXIETY  . atorvastatin (LIPITOR) 10 MG tablet Take 10 mg by mouth daily.  . brimonidine (ALPHAGAN) 0.2 % ophthalmic solution Place 1 drop into both eyes 2 (two) times daily.   Marland Kitchen buPROPion (WELLBUTRIN XL) 150 MG 24 hr tablet TAKE 3 TABLETS(450 MG) BY MOUTH EVERY MORNING  . dorzolamide-timolol (COSOPT) 22.3-6.8 MG/ML ophthalmic solution Place 1 drop into both eyes 2 (two) times daily.  . fluticasone (FLONASE) 50 MCG/ACT nasal spray Place 2 sprays into both nostrils daily as needed for allergies.   Marland Kitchen lamoTRIgine (LAMICTAL) 100 MG tablet 1 and 1/2 of the tablets daily  . latanoprost (XALATAN) 0.005 % ophthalmic solution Place 1 drop into both eyes at bedtime.  .  pantoprazole (PROTONIX) 40 MG tablet Take 1 tablet (40 mg total) by mouth 2 (two) times daily before a meal.  . timolol (TIMOPTIC) 0.5 % ophthalmic solution 1 drop Two (2) times a day.  . traZODone (DESYREL) 50 MG tablet TAKE 1 TO 2 TABLETS(50 TO 100 MG) BY MOUTH AT BEDTIME AS NEEDED FOR SLEEP OR INSOMNIA  . vitamin B-12 (CYANOCOBALAMIN) 500 MCG tablet Take 500 mcg every evening by mouth.   No facility-administered encounter medications on file as of 02/12/2021.    Past Medical History:  Diagnosis Date  . Anemia    hx of   . Arthritis    fingers   . BPH (benign prostatic hyperplasia)   . Colon cancer ([name])    stage II, diagnosed 2007  . Complication of anesthesia    vagal response after umbilcal hernia at [facility]  . Depression   . Diabetes mellitus    no as of 09/08/16 due to weight loss   . GERD (gastroesophageal reflux disease)   . Glaucoma 2012  . H. pylori infection 10/2015   Documented eradication March 2017  . History of kidney stones   . Sleep apnea    wears CiPaP at night  . Staph infection 2008   left side of face    Past Surgical History:  Procedure Laterality Date  . AGILE CAPSULE N/A 10/29/2019  Procedure: AGILE CAPSULE;  Surgeon: Daneil Dolin, MD;  [facility]: [facility];  Service: Endoscopy;  Laterality: N/A;  7:30am  . APPENDECTOMY    . BIOPSY  10/11/2019   Procedure: BIOPSY;  Surgeon: Daneil Dolin, MD;  Location: [facility];  Service: Endoscopy;;  colon  . CARDIAC CATHETERIZATION    . COLECTOMY  2007   right hemicolectomy  . COLONOSCOPY  05/11/10   normal rectum,normal residual colon with some angry appearring anastomtic mucosa with some erosions and oozing, bx unremarkable  . COLONOSCOPY  05/16/2012   Dr. Gala Romney- normal rectum, sigmoid diverticulosis, anastomotic ulcers. bx= acute ulcer and benign colonic mucosa with intramucosal lympoid aggregates  . COLONOSCOPY N/A 11/17/2016   Dr. Gala Romney: Ulcerated mucosa were present at the anastomosis. benign  bx. next TCS in 10/2021.  Marland Kitchen COLONOSCOPY WITH PROPOFOL N/A 10/11/2019   Dr. Gala Romney: Diverticulosis in the sigmoid colon and descending colon.  Focal ulcerations distal neoterminal ileum uncertain significance.  Biopsies were benign with no evidence of malignancy.  Givens capsule completed after colonoscopy.  5-year surveillance colonoscopy advised.  . ESOPHAGEAL DILATION N/A 10/28/2015   Procedure: ESOPHAGEAL DILATION;  Surgeon: Daneil Dolin, MD;  [facility];  Service: Endoscopy;  Laterality: N/A;  . ESOPHAGOGASTRODUODENOSCOPY N/A 10/28/2015   RMR: ulcerated proximal esophageal mass ad described status post biopsy. Abnormal distal esophagus biopsy. status post biopsy. Hiatal hernia . abnormal gastric mucosa doubt clinical significance status post biopsy  . ESOPHAGOGASTRODUODENOSCOPY (EGD) WITH PROPOFOL N/A 11/07/2017   Dr. Gala Romney: Normal study.    . EUS N/A 11/20/2015   Procedure: UPPER ENDOSCOPIC ULTRASOUND (EUS) LINEAR;  Surgeon: Milus Banister, MD;  Location: [facility];  Service: Endoscopy;  Laterality: N/A;  . GIVENS CAPSULE STUDY N/A 11/12/2019   Scattered erosions throughout the small bowel.  Ulceration again seen at the anastomosis.  No active bleeding.  Question ischemia  . HEMORRHOID SURGERY  12/31/2011   Procedure: HEMORRHOIDECTOMY;  Surgeon: Jamesetta So;  Location: [facility];  Service: General;  Laterality: N/A;  . HERNIA REPAIR     umbilical  . KIDNEY STONE SURGERY    . PILONIDAL CYST / SINUS EXCISION  1976  . SKIN LESION EXCISION     left shoulder/pre cancerous  . TRANSURETHRAL RESECTION OF PROSTATE N/A 09/09/2016   Procedure: TRANSURETHRAL RESECTION OF THE PROSTATE (TURP);  Surgeon: Irine Seal, MD;  Location: [facility];  Service: Urology;  Laterality: N/A;    Social History   Socioeconomic History  . Marital status: Married    Spouse name: Not on file  . Number of children: 1  . Years of education: Not on file  . Highest education level: Not on file   Occupational History  . Occupation: school system    Employer: [employer]  Tobacco Use  . Smoking status: Never Smoker  . Smokeless tobacco: Never Used  . Tobacco comment: Never smoked  Vaping Use  . Vaping Use: Never used  Substance and Sexual Activity  . Alcohol use: No    Alcohol/week: 0.0 standard drinks  . Drug use: No  . Sexual activity: Yes    Birth control/protection: None  Other Topics Concern  . Not on file  Social History Narrative  . Not on file   Social Determinants of Health   Financial Resource Strain: Not on file  Food Insecurity: Not on file  Transportation Needs: Not on file  Physical Activity: Not on file  Stress: Not on file  Social Connections:  Not on file  Intimate Partner Violence: Not on file    Family History  Problem Relation Age of Onset  . Lung cancer Father   . Diabetes Mother   . Anesthesia problems Neg Hx   . Hypotension Neg Hx   . Malignant hyperthermia Neg Hx   . Pseudochol deficiency Neg Hx   . Colon cancer Neg Hx        Objective: Vitals:   02/12/21 1108  BP: 136/69  Pulse: 63  Temp: 98 F (36.7 C)     Physical Exam Vitals reviewed.  Constitutional:      Appearance: Normal appearance.  Genitourinary:    Comments: AP without lesions. NST without mass. Prostate 1.5+ benign. SV non-palpable. Neurological:     Mental Status: He is alert.     Lab Results:  PSA No results found for: PSA No results found for: TESTOSTERONE No results found for this or any previous visit (from the past 24 hour(s)).    Studies/Results: CLINICAL DATA:  Crohn's disease, possible ulcers along the neo-terminal ileum.  EXAM: CT ABDOMEN AND PELVIS WITH CONTRAST (ENTEROGRAPHY)  TECHNIQUE: Multidetector CT of the abdomen and pelvis during bolus administration of intravenous contrast. Negative oral contrast was given.  CONTRAST:  127mL OMNIPAQUE IOHEXOL 300 MG/ML  SOLN  COMPARISON:  CT enterography of  01/10/2020  FINDINGS: Lower chest:  Unremarkable  Hepatobiliary: Unremarkable  Pancreas: Unremarkable  Spleen: Unremarkable  Adrenals/Urinary Tract: Both adrenal glands appear normal.  Two Bosniak category 1 left renal cysts are present. Other small hypodense renal lesions bilaterally are technically too small to characterize although statistically likely to be cysts.  Stomach/Bowel: Partial right colectomy with ileocolic anastomosis. There is an appearance of wall thickening especially along the lateral wall of the neo terminal ileum on images 30 through 35 of series 8 and also shown on images 98 through 105 of series 5, possibly with a small amount of fluid tracking in the wall, suspicious for ulceration. No extension beyond the wall margin.  Scant sigmoid colon diverticula without diverticulitis. Expected fold pattern in the jejunum. No other regions of bowel wall thickening.  Vascular/Lymphatic: Aortoiliac atherosclerotic vascular disease.  Reproductive: Mild prostatomegaly.  Other: No supplemental non-categorized findings.  Musculoskeletal: Bridging spurring of both sacroiliac joints. Thoracic and lumbar spondylosis with grade 1 degenerative anterolisthesis at L4-5.  IMPRESSION: 1. Partial right colectomy with ileocolic anastomosis. Abnormal wall thickening along the lateral wall of the neo terminal ileum, possibly with a small amount of fluid tracking in the wall, suspicious for ulceration. No extension beyond the wall margin. 2. Bosniak category 1 left renal cysts. Other small hypodense renal lesions bilaterally are technically too small to characterize although statistically likely to be cysts. 3. Mild prostatomegaly. 4. Thoracic and lumbar spondylosis with grade 1 degenerative anterolisthesis at L4-5. 5. Bridging spurring of both sacroiliac joints. 6. Aortic atherosclerosis.  Aortic Atherosclerosis (ICD10-I70.0).   Electronically  Signed   By: Van Clines M.D.   On: 01/28/2021 14:22    Assessment & Plan: BPH with BOO doing well s/p TURP with minimal LUTS.   I will get a PSA and have him return in a PSA in a year.  History of stones.  None seen on recent CT.    No orders of the defined types were placed in this encounter.    Orders Placed This Encounter  Procedures  . Urinalysis, Routine w reflex microscopic  . PSA, total and free  . PSA, total and free    Standing  Status:   Future    Standing Expiration Date:   02/12/2022      No follow-ups on file.   CC: Rory Percy, MD      Irine Seal 02/12/2021

## 2021-02-12 ENCOUNTER — Other Ambulatory Visit: Payer: Self-pay

## 2021-02-12 ENCOUNTER — Encounter: Payer: Self-pay | Admitting: Urology

## 2021-02-12 ENCOUNTER — Ambulatory Visit (INDEPENDENT_AMBULATORY_CARE_PROVIDER_SITE_OTHER): Payer: Medicare PPO | Admitting: Urology

## 2021-02-12 VITALS — BP 136/69 | HR 63 | Temp 98.0°F | Ht 69.0 in | Wt 216.0 lb

## 2021-02-12 DIAGNOSIS — R351 Nocturia: Secondary | ICD-10-CM | POA: Diagnosis not present

## 2021-02-12 DIAGNOSIS — N401 Enlarged prostate with lower urinary tract symptoms: Secondary | ICD-10-CM | POA: Diagnosis not present

## 2021-02-12 DIAGNOSIS — N138 Other obstructive and reflux uropathy: Secondary | ICD-10-CM | POA: Diagnosis not present

## 2021-02-12 NOTE — Progress Notes (Signed)

## 2021-02-13 LAB — URINALYSIS, ROUTINE W REFLEX MICROSCOPIC
Bilirubin, UA: NEGATIVE
Glucose, UA: NEGATIVE
Ketones, UA: NEGATIVE
Leukocytes,UA: NEGATIVE
Nitrite, UA: NEGATIVE
Protein,UA: NEGATIVE
Specific Gravity, UA: 1.025 (ref 1.005–1.030)
Urobilinogen, Ur: 0.2 mg/dL (ref 0.2–1.0)
pH, UA: 5 (ref 5.0–7.5)

## 2021-02-13 LAB — MICROSCOPIC EXAMINATION
Bacteria, UA: NONE SEEN
Epithelial Cells (non renal): NONE SEEN /hpf (ref 0–10)
RBC, Urine: NONE SEEN /hpf (ref 0–2)
Renal Epithel, UA: NONE SEEN /hpf
WBC, UA: NONE SEEN /hpf (ref 0–5)

## 2021-02-13 LAB — PSA, TOTAL AND FREE
PSA, Free Pct: 17.9 %
PSA, Free: 0.25 ng/mL
Prostate Specific Ag, Serum: 1.4 ng/mL (ref 0.0–4.0)

## 2021-02-13 NOTE — Progress Notes (Signed)
Results mailed to patient and sent via my chart.

## 2021-03-18 ENCOUNTER — Encounter: Payer: Self-pay | Admitting: Internal Medicine

## 2021-03-22 ENCOUNTER — Other Ambulatory Visit: Payer: Self-pay | Admitting: Psychiatry

## 2021-04-08 DIAGNOSIS — H4050X0 Glaucoma secondary to other eye disorders, unspecified eye, stage unspecified: Secondary | ICD-10-CM | POA: Diagnosis not present

## 2021-04-08 DIAGNOSIS — E538 Deficiency of other specified B group vitamins: Secondary | ICD-10-CM | POA: Diagnosis not present

## 2021-04-08 DIAGNOSIS — C189 Malignant neoplasm of colon, unspecified: Secondary | ICD-10-CM | POA: Diagnosis not present

## 2021-04-08 DIAGNOSIS — I10 Essential (primary) hypertension: Secondary | ICD-10-CM | POA: Diagnosis not present

## 2021-04-08 DIAGNOSIS — F419 Anxiety disorder, unspecified: Secondary | ICD-10-CM | POA: Diagnosis not present

## 2021-04-13 DIAGNOSIS — H6122 Impacted cerumen, left ear: Secondary | ICD-10-CM | POA: Diagnosis not present

## 2021-04-13 DIAGNOSIS — H903 Sensorineural hearing loss, bilateral: Secondary | ICD-10-CM | POA: Diagnosis not present

## 2021-04-20 ENCOUNTER — Other Ambulatory Visit: Payer: Self-pay | Admitting: Psychiatry

## 2021-04-20 DIAGNOSIS — H903 Sensorineural hearing loss, bilateral: Secondary | ICD-10-CM | POA: Diagnosis not present

## 2021-04-20 DIAGNOSIS — H66012 Acute suppurative otitis media with spontaneous rupture of ear drum, left ear: Secondary | ICD-10-CM | POA: Diagnosis not present

## 2021-04-21 DIAGNOSIS — H26493 Other secondary cataract, bilateral: Secondary | ICD-10-CM | POA: Diagnosis not present

## 2021-04-21 DIAGNOSIS — H401131 Primary open-angle glaucoma, bilateral, mild stage: Secondary | ICD-10-CM | POA: Diagnosis not present

## 2021-05-02 DIAGNOSIS — S90562A Insect bite (nonvenomous), left ankle, initial encounter: Secondary | ICD-10-CM | POA: Diagnosis not present

## 2021-05-02 DIAGNOSIS — Z6831 Body mass index (BMI) 31.0-31.9, adult: Secondary | ICD-10-CM | POA: Diagnosis not present

## 2021-05-06 DIAGNOSIS — G4733 Obstructive sleep apnea (adult) (pediatric): Secondary | ICD-10-CM | POA: Diagnosis not present

## 2021-05-11 DIAGNOSIS — Z1283 Encounter for screening for malignant neoplasm of skin: Secondary | ICD-10-CM | POA: Diagnosis not present

## 2021-05-11 DIAGNOSIS — L570 Actinic keratosis: Secondary | ICD-10-CM | POA: Diagnosis not present

## 2021-05-11 DIAGNOSIS — X32XXXD Exposure to sunlight, subsequent encounter: Secondary | ICD-10-CM | POA: Diagnosis not present

## 2021-05-11 DIAGNOSIS — I872 Venous insufficiency (chronic) (peripheral): Secondary | ICD-10-CM | POA: Diagnosis not present

## 2021-05-12 ENCOUNTER — Ambulatory Visit: Payer: Medicare PPO | Admitting: Internal Medicine

## 2021-05-12 ENCOUNTER — Encounter: Payer: Self-pay | Admitting: Internal Medicine

## 2021-05-12 ENCOUNTER — Other Ambulatory Visit: Payer: Self-pay

## 2021-05-12 VITALS — BP 135/79 | HR 60 | Temp 96.8°F | Ht 69.0 in | Wt 219.2 lb

## 2021-05-12 DIAGNOSIS — Z85038 Personal history of other malignant neoplasm of large intestine: Secondary | ICD-10-CM

## 2021-05-12 DIAGNOSIS — K289 Gastrojejunal ulcer, unspecified as acute or chronic, without hemorrhage or perforation: Secondary | ICD-10-CM | POA: Diagnosis not present

## 2021-05-12 DIAGNOSIS — K219 Gastro-esophageal reflux disease without esophagitis: Secondary | ICD-10-CM

## 2021-05-12 DIAGNOSIS — K760 Fatty (change of) liver, not elsewhere classified: Secondary | ICD-10-CM | POA: Diagnosis not present

## 2021-05-12 MED ORDER — PANTOPRAZOLE SODIUM 40 MG PO TBEC: 40.0000 mg | DELAYED_RELEASE_TABLET | Freq: Two times a day (BID) | ORAL | 3 refills | Status: DC

## 2021-05-12 NOTE — Patient Instructions (Addendum)
GERD information provided  Continue pantoprazole 40 mg twice daily (before breakfast and supper) new prescription - dispense 180 with 3 refills.  Regular aerobic exercise 30 to 45 minutes 3 times a week.  Strive for 20 pound weight loss within the next 12 months  As discussed, we will consider repeating the CT scan to look at the ulcerated area in 1 year when he returns for follow-up.  No need to change planning of next colonoscopy which would be 2025  Office follow-up with me in 12 months  If any interim problems, please call me.

## 2021-05-12 NOTE — Progress Notes (Signed)
Primary Care Physician:  Rory Percy, MD Primary Gastroenterologist:  Dr. Gala Romney  Pre-Procedure History & Physical: HPI:  [name] is a 70 y.o. male here for follow-up of GERD.  History of stage II colon cancer resected 2008. Fatty liver on recent CTE.  Anastomotic ulcer -chronic longstanding.  Labs earlier this year including CBC CRP and LFTs all normal.  Patient denies any alteration in bowel function;  no bleeding.  Denies specifically, postprandial abdominal pain;  weight has fluctuated.  He is gained 6 pounds since his last office visit here.  GERD well controlled on twice daily Protonix.  No dysphagia. Prior ulcer biopsies demonstrated nonspecific ulceration.  Denies NSAIDs.  He does have some atherosclerosis on prior contrast CT - nothing to suggest advanced disease. Patient states he experienced some fatigue earlier in the year but none since that time.  He is now completely retired from [employer].  Prior capsule and ileocolonoscopy findings as chronicled in the medical record.  Past Medical History:  Diagnosis Date  . Anemia    hx of   . Arthritis    fingers   . BPH (benign prostatic hyperplasia)   . Colon cancer ([place])    stage II, diagnosed 2007  . Complication of anesthesia    vagal response after umbilcal hernia at [facility]  . Depression   . Diabetes mellitus    no as of 09/08/16 due to weight loss   . GERD (gastroesophageal reflux disease)   . Glaucoma 2012  . H. pylori infection 10/2015   Documented eradication March 2017  . History of kidney stones   . Sleep apnea    wears CiPaP at night  . Staph infection 2008   left side of face    Past Surgical History:  Procedure Laterality Date  . AGILE CAPSULE N/A 10/29/2019   Procedure: AGILE CAPSULE;  Surgeon: Daneil Dolin, MD;  [facility];  Service: Endoscopy;  Laterality: N/A;  7:30am  . APPENDECTOMY    . BIOPSY  10/11/2019   Procedure: BIOPSY;  Surgeon: Daneil Dolin, MD;  [facility];  Service: Endoscopy;;  colon  . CARDIAC CATHETERIZATION    . COLECTOMY  2007   right hemicolectomy  . COLONOSCOPY  05/11/10   normal rectum,normal residual colon with some angry appearring anastomtic mucosa with some erosions and oozing, bx unremarkable  . COLONOSCOPY  05/16/2012   Dr. Gala Romney- normal rectum, sigmoid diverticulosis, anastomotic ulcers. bx= acute ulcer and benign colonic mucosa with intramucosal lympoid aggregates  . COLONOSCOPY N/A 11/17/2016   Dr. Gala Romney: Ulcerated mucosa were present at the anastomosis. benign bx. next TCS in 10/2021.  Marland Kitchen COLONOSCOPY WITH PROPOFOL N/A 10/11/2019   Dr. Gala Romney: Diverticulosis in the sigmoid colon and descending colon.  Focal ulcerations distal neoterminal ileum uncertain significance.  Biopsies were benign with no evidence of malignancy.  Givens capsule completed after colonoscopy.  5-year surveillance colonoscopy advised.  . ESOPHAGEAL DILATION N/A 10/28/2015   Procedure: ESOPHAGEAL DILATION;  Surgeon: Daneil Dolin, MD;  [facility];  Service: Endoscopy;  Laterality: N/A;  . ESOPHAGOGASTRODUODENOSCOPY N/A 10/28/2015   RMR: ulcerated proximal esophageal mass ad described status post biopsy. Abnormal distal esophagus biopsy. status post biopsy. Hiatal hernia . abnormal gastric mucosa doubt clinical significance status post biopsy  . ESOPHAGOGASTRODUODENOSCOPY (EGD) WITH PROPOFOL N/A 11/07/2017   Dr. Gala Romney: Normal study.    . EUS N/A 11/20/2015   Procedure: UPPER ENDOSCOPIC ULTRASOUND (EUS) LINEAR;  Surgeon: Quillian Quince  Merrily Brittle, MD;  Location: [facility];  Service: Endoscopy;  Laterality: N/A;  . GIVENS CAPSULE STUDY N/A 11/12/2019   Scattered erosions throughout the small bowel.  Ulceration again seen at the anastomosis.  No active bleeding.  Question ischemia  . HEMORRHOID SURGERY  12/31/2011   Procedure: HEMORRHOIDECTOMY;  Surgeon: Jamesetta So;  Location: [facility];  Service: General;  Laterality: N/A;  . HERNIA REPAIR      umbilical  . KIDNEY STONE SURGERY    . PILONIDAL CYST / SINUS EXCISION  1976  . SKIN LESION EXCISION     left shoulder/pre cancerous  . TRANSURETHRAL RESECTION OF PROSTATE N/A 09/09/2016   Procedure: TRANSURETHRAL RESECTION OF THE PROSTATE (TURP);  Surgeon: Irine Seal, MD;  Location: [facility];  Service: Urology;  Laterality: N/A;    Prior to Admission medications   Medication Sig Start Date End Date Taking? Authorizing Provider  ALPRAZolam Duanne Moron) 0.5 MG tablet TAKE 1 TABLET BY MOUTH TWICE DAILY AS NEEDED FOR ANXIETY 10/27/20  Yes Cottle, Billey Co., MD  atorvastatin (LIPITOR) 10 MG tablet Take 10 mg by mouth daily. 08/05/20  Yes [provider]  brimonidine (ALPHAGAN) 0.2 % ophthalmic solution Place 1 drop into both eyes 2 (two) times daily.    Yes [provider]  buPROPion (WELLBUTRIN XL) 150 MG 24 hr tablet TAKE 3 TABLETS(450 MG) BY MOUTH EVERY MORNING 04/22/21  Yes Cottle, Billey Co., MD  dorzolamide-timolol (COSOPT) 22.3-6.8 MG/ML ophthalmic solution Place 1 drop into both eyes 2 (two) times daily.   Yes [provider]  fluticasone (FLONASE) 50 MCG/ACT nasal spray Place 2 sprays into both nostrils daily as needed for allergies.  09/21/18  Yes [provider]  latanoprost (XALATAN) 0.005 % ophthalmic solution Place 1 drop into both eyes at bedtime.   Yes [provider]  pantoprazole (PROTONIX) 40 MG tablet Take 1 tablet (40 mg total) by mouth 2 (two) times daily before a meal. 07/10/19  Yes Mahala Menghini, PA-C  timolol (TIMOPTIC) 0.5 % ophthalmic solution 1 drop Two (2) times a day.   Yes [provider]  traZODone (DESYREL) 50 MG tablet TAKE 1 TO 2 TABLETS(50 TO 100 MG) BY MOUTH AT BEDTIME AS NEEDED FOR SLEEP OR INSOMNIA 03/19/20  Yes Cottle, Billey Co., MD  vitamin B-12 (CYANOCOBALAMIN) 500 MCG tablet Take 500 mcg every evening by mouth.   Yes [provider]    Allergies as of 05/12/2021 - Review Complete 05/12/2021   Allergen Reaction Noted  . Ambien [zolpidem tartrate]  09/12/2018  . Bactrim [sulfamethoxazole-trimethoprim] Hives and Swelling 09/08/2016  . Dilaudid [hydromorphone hcl] Other (See Comments) 09/08/2016  . Morphine and related Nausea And Vomiting 09/08/2016    Family History  Problem Relation Age of Onset  . Lung cancer Father   . Diabetes Mother   . Anesthesia problems Neg Hx   . Hypotension Neg Hx   . Malignant hyperthermia Neg Hx   . Pseudochol deficiency Neg Hx   . Colon cancer Neg Hx     Social History   Socioeconomic History  . Marital status: Married    Spouse name: Not on file  . Number of children: 1  . Years of education: Not on file  . Highest education level: Not on file  Occupational History  . Occupation: school system    Employer: [employer]  Tobacco Use  . Smoking status: Never Smoker  . Smokeless tobacco: Never Used  . Tobacco comment: Never  smoked  Vaping Use  . Vaping Use: Never used  Substance and Sexual Activity  . Alcohol use: No    Alcohol/week: 0.0 standard drinks  . Drug use: No  . Sexual activity: Yes    Birth control/protection: None  Other Topics Concern  . Not on file  Social History Narrative  . Not on file   Social Determinants of Health   Financial Resource Strain: Not on file  Food Insecurity: Not on file  Transportation Needs: Not on file  Physical Activity: Not on file  Stress: Not on file  Social Connections: Not on file  Intimate Partner Violence: Not on file    Review of Systems: See HPI, otherwise negative ROS  Physical Exam: BP 135/79   Pulse 60   Temp (!) 96.8 F (36 C) (Temporal)   Ht 5\' 9"  (1.753 m)   Wt 219 lb 3.2 oz (99.4 kg)   BMI 32.37 kg/m  General:   Alert,   pleasant and cooperative in NAD Neck:  Supple; no masses or thyromegaly. No significant cervical adenopathy. Lungs:  Clear throughout to auscultation.   No wheezes, crackles, or rhonchi. No acute distress. Heart:  Regular rate and  rhythm; no murmurs, clicks, rubs,  or gallops. Abdomen: Non-distended, normal bowel sounds.  Soft and nontender without appreciable mass or hepatosplenomegaly.  Extremities:  Without clubbing or edema.  Impression/Plan: 71 year old gentleman with a longstanding GERD well-controlled on twice daily PPI therapy.  No alarm symptoms.  History of stage II colon cancer.  Anastomotic ulceration as discussed above.  May be due to localized endorgan ischemia related to surgery.  Doubt NSAID effect.  Certainly doubt more global ischemia with weight gain and lack of pain with eating. Cannot absolutely exclude subclinical inflammatory bowel disease (i.e. Crohn's ileitis) in evolution.  It is interesting that he has some bridging spurring in both the sacroiliac joints. Fatty liver with repeatedly normal LFTs over time.  Discussed at length the concept of weight loss and aerobic exercise would be of great benefit to his health overall.  Recommendations:  GERD information provided  Continue pantoprazole 40 mg twice daily (before breakfast and supper) new prescription - dispense 180 with 3 refills.  Regular aerobic exercise 30 to 45 minutes 3 times a week.  Strive for 20 pound weight loss within the next 12 months  As discussed, we will consider repeating the CT scan to look at the ulcerated area in 1 year when he returns for follow-up.  No need to change planning of next colonoscopy which would be 2025  Office follow-up with me in 12 months  If any interim problems, please call me.       Notice: This dictation was prepared with Dragon dictation along with smaller phrase technology. Any transcriptional errors that result from this process are unintentional and may not be corrected upon review.

## 2021-05-14 ENCOUNTER — Other Ambulatory Visit: Payer: Self-pay

## 2021-05-14 ENCOUNTER — Encounter: Payer: Self-pay | Admitting: Psychiatry

## 2021-05-14 ENCOUNTER — Ambulatory Visit (INDEPENDENT_AMBULATORY_CARE_PROVIDER_SITE_OTHER): Payer: Medicare PPO | Admitting: Psychiatry

## 2021-05-14 DIAGNOSIS — F331 Major depressive disorder, recurrent, moderate: Secondary | ICD-10-CM | POA: Diagnosis not present

## 2021-05-14 DIAGNOSIS — F902 Attention-deficit hyperactivity disorder, combined type: Secondary | ICD-10-CM | POA: Diagnosis not present

## 2021-05-14 DIAGNOSIS — F5105 Insomnia due to other mental disorder: Secondary | ICD-10-CM | POA: Diagnosis not present

## 2021-05-14 DIAGNOSIS — F411 Generalized anxiety disorder: Secondary | ICD-10-CM

## 2021-05-14 DIAGNOSIS — F341 Dysthymic disorder: Secondary | ICD-10-CM | POA: Diagnosis not present

## 2021-05-14 DIAGNOSIS — Z630 Problems in relationship with spouse or partner: Secondary | ICD-10-CM

## 2021-05-14 DIAGNOSIS — F39 Unspecified mood [affective] disorder: Secondary | ICD-10-CM

## 2021-05-14 MED ORDER — BUPROPION HCL ER (XL) 150 MG PO TB24: 450.0000 mg | ORAL_TABLET | Freq: Every day | ORAL | 1 refills | Status: DC

## 2021-05-14 MED ORDER — TRAZODONE HCL 150 MG PO TABS: 150.0000 mg | ORAL_TABLET | Freq: Every day | ORAL | 3 refills | Status: DC

## 2021-05-14 NOTE — Progress Notes (Signed)
JADD GASIOR [id number] [birth year] 70 y.o.     Subjective:   Patient ID:  [name] is a 71 y.o. (DOB 25-Jan-1951) male.  Chief Complaint:  Chief Complaint  Patient presents with  . Follow-up  . Depression  . Episodic mood disorder ([place])  . Sleeping Problem  . ADHD    Depression        Associated symptoms include no decreased concentration and no suicidal ideas.  [name] presents for follow-up of depression and medication changes.  At visit February 12, 2019.  He had become more depressed off of the [name] and it was recommended he restart [name] 1.5 mg daily.  He also had previously felt edgy and so we reduced the Wellbutrin from 450 to 300 mg daily in hopes of cutting down on that edgy feeling. He felt edgier after reducing the Wellbutrin to 300 so increased it back.    visit May 2020.  [name] was increased to 1.5 mg daily to try to help more consistently with depression and anxiety.   visit October 24, 2019.  [name] was stopped because it had no beneficial effect.  Adderall was added.   visit February 06, 2020 the following was noted: My highs are higher and lows are lower. Started noticing this more since January.   At times more irritable and needs Xanax.  Off Adderall for 2 mos DT NR.  Last 6 weeks had [name] more alone without wife.  Has a good time with [name].  Gets frustrated that wife complains a lot over what he does.  Don't lose my cool anywhere except at home.  Distracted big time.  Mind wanders.  Nothing is changed at home.  Has talked with wife about it.  They are both their for [name] only.  Unhappy with marriage.  I hate to go out of the world like this.   Nothing I can do about it. Lost 50# and feels good about it.  Has walked a lot and cut back eating.  Step dad died and helps with mother. More irritable lately ? Related to stopping the [name]. Call back if any symptoms are worse. In February 2021 the following was started: Zoloft trial for  irritability and anxiety.  25 mg for 7 day then 50 mg daily.  As of 04/01/20 the following noted: Wife irritable and not feeling well and hasn't noticed any effect of his starting Zoloft.  She gets mad over [name]'s care by pt. I've seen a little difference in calmer in situations at home with Zoloft 50.  No SE.  1 cup coffee and a drink a day.  Trazodone still helps sleep.    06/13/20 appt with the following noted: Adderall helping productivity and focus.  Wife notices Out of sertraline.  Couldn't tell a lot of difference with the Zoloft but not sure. No SE Adderall. I know I'm bipolar bc can get on a high and drop to a low.  Can be hyperverbal, hyper energetic.  Wife says he's stuck in the past. Wishes he could get rid of depression.  Can enjoy some things. Plan: DC sertraline bc minimal effect. Latuda 20 mg suggested. Take Latuda with at least 350 calories. Take 20 mg daily for 4 weeks. If no effect then increase to 40 mg daily.  08/18/20 appt with the following noted: No change with Latuda 20 that was any better and stopped it.  Not sure he took 40 mg daily. Seems like he's busier than he's been  but also some free time.  Taking care of Mo.  Cuts grass for 2 days.  Went to [name] to visit someone this weekend. Stress wife afraid constantly of Covid and he has to wear mask in his own house. Sleep better with trazodone. Plan: Adderall trial 15 BID  01/12/2021 appointment noted: Adderall not helpful and stopped. Past being concerned about [name]'s comments about his focus.  Getting older. Stayed on Wellbutrin 450, lamotrigine 200, trazodone 100 HS.   No SE. Stress aunt driving him crazy but she's good to him.  Handled Xmas OK. Still wonders at times about bipolar bc irritability. Pt reports that mood is Anxious, Depressed, Dysphoric and Irritable but still trying to figure it out. Not a lot to do.  No friends to do things with.  Routine of a loner.  Describes anxiety as Moderate. Anxiety  symptoms include: Excessive Worry,. Pt reports no sleep issues. Pt reports that appetite is good. Pt reports that energy is lethargic and down slightly and loss of interest or pleasure in usual activities. Concentration is difficulty with focus and attention. Suicidal thoughts:  denied by patient.  Brain doesn't shut off.  .  Hard to push himself into hobbies too.  Unselttl;ed. Bored in retirement and lonely at home.  W preoccupied and consumed with developmentally disabled son [name] and ignores pt.  Even harder time keeping [name] and managing his emotions about it.  Some rumination and guilt on his thoughts. Plan: Reduce lamotrigine to 1 and 1/2 of the 100 mg tablets for 4 weeks, Then reduce lamotrigine to 1 of the tablets for 4 weeks,  Then reduce lamotrigine to 1/2 tablets for 2 weeks,  Then stop lamotrigine  05/14/21 appt noted: Dep OK but nothing to do. Can't stand [name] anymore, more irritating.   Not sleeping good with trazodone.  Trouble going to sleep.  To sleep 1230 and up 830.  Just got new CPAP and it helps. To sleep 2 am. Also on Wellbutrin xl 450 mg AM. NO SE Weaned off lamotrigine and no differences noticed. He feels the # eipisodes with W have lessened in frequency and intensity.   Past Psychiatric Medication Trials: [name] 1.5, Abilify side effects in 2012, risperidone,  paroxetine, venlafaxine,  nortriptyline, duloxetine,  Pristiq, amitriptyline,, lithium, lamotrigine NR, [name] no response Latuda 20 for 3 weeks NR Ambien cognitive side effects, Lunesta cognitive side effects, trazodone, Xanax, buspirone [place], Nuvigil, Concerta, Vyvanse, Adderall NR  Review of Systems:  Review of Systems  HENT: Positive for hearing loss.   Cardiovascular: Negative for palpitations.  Musculoskeletal: Positive for back pain.  Neurological: Negative for dizziness, tremors and weakness.  Psychiatric/Behavioral: Positive for depression and dysphoric mood. Negative for agitation,  behavioral problems, confusion, decreased concentration, hallucinations, self-injury, sleep disturbance and suicidal ideas. The patient is nervous/anxious. The patient is not hyperactive.     Medications: I have reviewed the patient's current medications.  Current Outpatient Medications  Medication Sig Dispense Refill  . ALPRAZolam (XANAX) 0.5 MG tablet TAKE 1 TABLET BY MOUTH TWICE DAILY AS NEEDED FOR ANXIETY 60 tablet 5  . atorvastatin (LIPITOR) 10 MG tablet Take 10 mg by mouth daily.    . brimonidine (ALPHAGAN) 0.2 % ophthalmic solution Place 1 drop into both eyes 2 (two) times daily.     . dorzolamide-timolol (COSOPT) 22.3-6.8 MG/ML ophthalmic solution Place 1 drop into both eyes 2 (two) times daily.    . fluticasone (FLONASE) 50 MCG/ACT nasal spray Place 2 sprays into both nostrils daily as  needed for allergies.   2  . latanoprost (XALATAN) 0.005 % ophthalmic solution Place 1 drop into both eyes at bedtime.    . pantoprazole (PROTONIX) 40 MG tablet Take 1 tablet (40 mg total) by mouth 2 (two) times daily before a meal. 60 tablet 5  . timolol (TIMOPTIC) 0.5 % ophthalmic solution 1 drop Two (2) times a day.    . vitamin B-12 (CYANOCOBALAMIN) 500 MCG tablet Take 500 mcg every evening by mouth.    Marland Kitchen buPROPion (WELLBUTRIN XL) 150 MG 24 hr tablet Take 3 tablets (450 mg total) by mouth daily. 270 tablet 1  . traZODone (DESYREL) 150 MG tablet Take 1 tablet (150 mg total) by mouth at bedtime. 90 tablet 3   No current facility-administered medications for this visit.    Medication Side Effects: None  Allergies:  Allergies  Allergen Reactions  . Ambien [Zolpidem Tartrate]     Sleep walks  . Bactrim [Sulfamethoxazole-Trimethoprim] Hives and Swelling  . Dilaudid [Hydromorphone Hcl] Other (See Comments)    Made blood pressure drop with colon resection surgery in 2007   . Morphine And Related Nausea And Vomiting    Past Medical History:  Diagnosis Date  . Anemia    hx of   . Arthritis     fingers   . BPH (benign prostatic hyperplasia)   . Colon cancer ([place])    stage II, diagnosed 2007  . Complication of anesthesia    vagal response after umbilcal hernia at [facility]  . Depression   . Diabetes mellitus    no as of 09/08/16 due to weight loss   . GERD (gastroesophageal reflux disease)   . Glaucoma 2012  . H. pylori infection 10/2015   Documented eradication March 2017  . History of kidney stones   . Sleep apnea    wears CiPaP at night  . Staph infection 2008   left side of face    Family History  Problem Relation Age of Onset  . Lung cancer Father   . Diabetes Mother   . Anesthesia problems Neg Hx   . Hypotension Neg Hx   . Malignant hyperthermia Neg Hx   . Pseudochol deficiency Neg Hx   . Colon cancer Neg Hx     Social History   Socioeconomic History  . Marital status: Married    Spouse name: Not on file  . Number of children: 1  . Years of education: Not on file  . Highest education level: Not on file  Occupational History  . Occupation: school system    Employer: [employer]  Tobacco Use  . Smoking status: Never Smoker  . Smokeless tobacco: Never Used  . Tobacco comment: Never smoked  Vaping Use  . Vaping Use: Never used  Substance and Sexual Activity  . Alcohol use: No    Alcohol/week: 0.0 standard drinks  . Drug use: No  . Sexual activity: Yes    Birth control/protection: None  Other Topics Concern  . Not on file  Social History Narrative  . Not on file   Social Determinants of Health   Financial Resource Strain: Not on file  Food Insecurity: Not on file  Transportation Needs: Not on file  Physical Activity: Not on file  Stress: Not on file  Social Connections: Not on file  Intimate Partner Violence: Not on file    Past Medical History, Surgical history, Social history, and Family history were reviewed and updated as appropriate.   Please see review of  systems for further details on the patient's review from today.    Objective:   Physical Exam:  There were no vitals taken for this visit.  Physical Exam Constitutional:      General: He is not in acute distress.    Appearance: He is well-developed.  Musculoskeletal:        General: No deformity.  Neurological:     Mental Status: He is alert and oriented to person, place, and time.     Cranial Nerves: No dysarthria.     Coordination: Coordination normal.  Psychiatric:        Attention and Perception: Perception normal. He is inattentive. He does not perceive auditory or visual hallucinations.        Mood and Affect: Mood is depressed. Mood is not anxious. Affect is not labile, blunt, angry, tearful or inappropriate.        Speech: Speech normal. Speech is not rapid and pressured.        Behavior: Behavior normal. Behavior is cooperative.        Thought Content: Thought content normal. Thought content is not paranoid or delusional. Thought content does not include homicidal or suicidal ideation. Thought content does not include homicidal or suicidal plan.        Cognition and Memory: Cognition and memory normal.        Judgment: Judgment normal.     Comments: Chronic dysthymia is unchanged.  He is not particularly anxious this time. Irritability is present but not too bad.     Lab Review:     Component Value Date/Time   NA 141 12/30/2020 1137   K 4.4 12/30/2020 1137   CL 100 12/30/2020 1137   CO2 27 12/30/2020 1137   GLUCOSE 164 (H) 12/30/2020 1137   GLUCOSE 89 08/13/2019 1143   BUN 19 12/30/2020 1137   CREATININE 1.03 12/30/2020 1137   CREATININE 0.90 12/11/2019 0832   CALCIUM 9.4 12/30/2020 1137   PROT 6.4 (L) 08/13/2019 1143   ALBUMIN 3.7 08/13/2019 1143   AST 17 08/13/2019 1143   ALT 13 08/13/2019 1143   ALKPHOS 79 08/13/2019 1143   BILITOT 0.7 08/13/2019 1143   GFRNONAA 74 12/30/2020 1137   GFRAA 86 12/30/2020 1137       Component Value Date/Time   WBC 5.1 12/30/2020 1137   WBC 4.6 08/13/2019 1143   RBC 5.23 12/30/2020  1137   RBC 4.76 08/13/2019 1143   HGB 16.0 12/30/2020 1137   HCT 46.9 12/30/2020 1137   PLT 158 12/30/2020 1137   MCV 90 12/30/2020 1137   MCH 30.6 12/30/2020 1137   MCH 29.6 08/13/2019 1143   MCHC 34.1 12/30/2020 1137   MCHC 33.1 08/13/2019 1143   RDW 13.3 12/30/2020 1137   LYMPHSABS 1.2 12/30/2020 1137   MONOABS 0.9 07/31/2019 0235   EOSABS 0.1 12/30/2020 1137   BASOSABS 0.0 12/30/2020 1137    No results found for: POCLITH, LITHIUM   No results found for: PHENYTOIN, PHENOBARB, VALPROATE, CBMZ   Normal stress test.  No exercise.  Won't stick with diet. 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  He thinks now he has bipolar disorder.  He has failed multiple medications listed above.  Latuda 20 failed and he doesn't want to try 40 mg daily.  This is an option. Stopped Latuda.  Supportive therapy on problems with home life.  Trying to motivate for the hobbies.    Xanax 0.5 mg bid prn anxiety. Using rarely for sleep.  It is worth noting that he was worse with irritability and edginess after reducing the Wellbutrin to 300 mg daily and better at 450 mg daily so we will continue Wellbutrin 450 mg daily.  Chronic attention problems remain and he is failed multiple ADD meds including off label things such as modafinil and Nuvigil .  His wife complains of his ADD symptoms as well.   No problem off lamotrigine.  Increase trazodone to 150 mg HS for sleep and might help mood.  This appt was 30 mins.  FU 4-6  mos  Lynder Parents, MD, DFAPA   Please see After Visit Summary for patient specific instructions.  Future Appointments  Date Time Provider [place]  02/04/2022 10:30 AM [facility] None  02/11/2022 10:30 AM Irine Seal, MD [facility] None    No orders of the defined types were placed in this encounter.     -------------------------------

## 2021-06-06 DIAGNOSIS — G4733 Obstructive sleep apnea (adult) (pediatric): Secondary | ICD-10-CM | POA: Diagnosis not present

## 2021-06-10 DIAGNOSIS — E538 Deficiency of other specified B group vitamins: Secondary | ICD-10-CM | POA: Diagnosis not present

## 2021-06-10 DIAGNOSIS — R5383 Other fatigue: Secondary | ICD-10-CM | POA: Diagnosis not present

## 2021-06-15 ENCOUNTER — Telehealth: Payer: Self-pay | Admitting: Internal Medicine

## 2021-06-15 NOTE — Telephone Encounter (Signed)
Returned the pt's call and was advised by him that basically before his last ov with you he had anal itching. States it has seem to get worse because he's out in the heat a lot and since ov he has had pain in the same area. Pt states it's more like a bruising feeling especially when he wipes. Pt states sitting in the car is like he is sitting on something. No bleeding. Please advise

## 2021-06-15 NOTE — Telephone Encounter (Signed)
Pt called to say that he had seen RMR in the office on 05/12/2021 and since then he is having rectal pain with itching. Please advise. [phone]

## 2021-06-16 ENCOUNTER — Other Ambulatory Visit: Payer: Self-pay

## 2021-06-16 NOTE — Progress Notes (Signed)
Phoned in Rx for the pt to [place] with 2 refill.  2. Phoned the pt and gave directions on how to use the cream and advised of a powder called Zeasorb prevention super absorbent (OTC/[employer]) where he can purchase. Directions given on how to use both medications.

## 2021-06-16 NOTE — Telephone Encounter (Signed)
Here are my recommendations:  In the evening take a shower.  Dry perianal area very well.  Apply a pea-sized amount of [place] apothecary cream (dispense 30 g apply pea-sized amount to the anal canal and perianal area each evening-1 refill).  In the mornings,  use Zeasorb prevention super absorbent powder (over-the-counter at your drugstore or available [employer]) liberally to the perianal area.

## 2021-06-16 NOTE — Telephone Encounter (Signed)
Floria Raveling I, CMA at 06/16/2021  3:01 PM  Status: Signed  Phoned in Rx for the pt to [facility] with 2 refill.   2. Phoned the pt and gave directions on how to use the cream and advised of a powder called Zeasorb prevention super absorbent (OTC/[employer]) where he can purchase. Directions given on how to use both medications.

## 2021-07-04 DIAGNOSIS — H6090 Unspecified otitis externa, unspecified ear: Secondary | ICD-10-CM | POA: Diagnosis not present

## 2021-07-04 DIAGNOSIS — J069 Acute upper respiratory infection, unspecified: Secondary | ICD-10-CM | POA: Diagnosis not present

## 2021-07-04 DIAGNOSIS — Z20828 Contact with and (suspected) exposure to other viral communicable diseases: Secondary | ICD-10-CM | POA: Diagnosis not present

## 2021-07-06 DIAGNOSIS — G4733 Obstructive sleep apnea (adult) (pediatric): Secondary | ICD-10-CM | POA: Diagnosis not present

## 2021-07-09 DIAGNOSIS — H60331 Swimmer's ear, right ear: Secondary | ICD-10-CM | POA: Diagnosis not present

## 2021-07-20 ENCOUNTER — Telehealth: Payer: Self-pay | Admitting: Internal Medicine

## 2021-07-20 NOTE — Telephone Encounter (Signed)
Per 05/12/2021 OV with Dr. Gala Romney: "As discussed, we will consider repeating the CT scan to look at the ulcerated area in 1 year when he returns for follow-up."

## 2021-07-20 NOTE — Telephone Encounter (Signed)
RECALL FOR CTE 

## 2021-07-23 ENCOUNTER — Ambulatory Visit: Payer: Medicare PPO | Admitting: Psychiatry

## 2021-07-24 ENCOUNTER — Encounter: Payer: Self-pay | Admitting: Psychiatry

## 2021-07-24 ENCOUNTER — Ambulatory Visit: Payer: Medicare PPO | Admitting: Psychiatry

## 2021-07-24 ENCOUNTER — Other Ambulatory Visit: Payer: Self-pay

## 2021-07-24 ENCOUNTER — Other Ambulatory Visit: Payer: Self-pay | Admitting: Psychiatry

## 2021-07-24 DIAGNOSIS — F331 Major depressive disorder, recurrent, moderate: Secondary | ICD-10-CM | POA: Diagnosis not present

## 2021-07-24 DIAGNOSIS — F39 Unspecified mood [affective] disorder: Secondary | ICD-10-CM | POA: Diagnosis not present

## 2021-07-24 DIAGNOSIS — F902 Attention-deficit hyperactivity disorder, combined type: Secondary | ICD-10-CM | POA: Diagnosis not present

## 2021-07-24 DIAGNOSIS — F341 Dysthymic disorder: Secondary | ICD-10-CM | POA: Diagnosis not present

## 2021-07-24 DIAGNOSIS — F5105 Insomnia due to other mental disorder: Secondary | ICD-10-CM

## 2021-07-24 DIAGNOSIS — F411 Generalized anxiety disorder: Secondary | ICD-10-CM | POA: Diagnosis not present

## 2021-07-24 MED ORDER — OLANZAPINE 10 MG PO TABS: 10.0000 mg | ORAL_TABLET | Freq: Every day | ORAL | 1 refills | Status: DC

## 2021-07-24 NOTE — Progress Notes (Signed)
PERKINS EDGE PA:[id number] 1951-10-20 70 y.o.     Subjective:   Patient ID:  [name] is a 70 y.o. (DOB [birth year]) male.  Chief Complaint:  Chief Complaint  Patient presents with   Follow-up   Depression   Anxiety    Depression        Associated symptoms include no decreased concentration and no suicidal ideas. [name] presents for follow-up of depression and medication changes.  At visit February 12, 2019.  He had become more depressed off of the [name] and it was recommended he restart [name] 1.5 mg daily.  He also had previously felt edgy and so we reduced the Wellbutrin from 450 to 300 mg daily in hopes of cutting down on that edgy feeling. He felt edgier after reducing the Wellbutrin to 300 so increased it back.    visit May 2020.  [name] was increased to 1.5 mg daily to try to help more consistently with depression and anxiety.   visit October 24, 2019.  [name] was stopped because it had no beneficial effect.  Adderall was added.   visit February 06, 2020 the following was noted: My highs are higher and lows are lower. Started noticing this more since January.   At times more irritable and needs Xanax.  Off Adderall for 2 mos DT NR.  Last 6 weeks had [name] more alone without wife.  Has a good time with [name].  Gets frustrated that wife complains a lot over what he does.  Don't lose my cool anywhere except at home.  Distracted big time.  Mind wanders.  Nothing is changed at home.  Has talked with wife about it.  They are both their for [name] only.  Unhappy with marriage.  I hate to go out of the world like this.   Nothing I can do about it. Lost 50# and feels good about it.  Has walked a lot and cut back eating.  Step dad died and helps with mother. More irritable lately ? Related to stopping the [name]. Call back if any symptoms are worse. In February 2021 the following was started: Zoloft trial for irritability and anxiety.  25 mg for 7 day then 50 mg  daily.  As of 04/01/20 the following noted: Wife irritable and not feeling well and hasn't noticed any effect of his starting Zoloft.  She gets mad over [name]'s care by pt. I've seen a little difference in calmer in situations at home with Zoloft 50.  No SE.  1 cup coffee and a drink a day.  Trazodone still helps sleep.    06/13/20 appt with the following noted: Adderall helping productivity and focus.  Wife notices Out of sertraline.  Couldn't tell a lot of difference with the Zoloft but not sure. No SE Adderall. I know I'm bipolar bc can get on a high and drop to a low.  Can be hyperverbal, hyper energetic.  Wife says he's stuck in the past. Wishes he could get rid of depression.  Can enjoy some things. Plan: DC sertraline bc minimal effect. Latuda 20 mg suggested. Take Latuda with at least 350 calories. Take 20 mg daily for 4 weeks. If no effect then increase to 40 mg daily.  08/18/20 appt with the following noted: No change with Latuda 20 that was any better and stopped it.  Not sure he took 40 mg daily. Seems like he's busier than he's been but also some free time.  Taking care of Mo.  Cuts grass for 2 days.  Went to [name] to visit someone this weekend. Stress wife afraid constantly of Covid and he has to wear mask in his own house. Sleep better with trazodone. Plan: Adderall trial 15 BID  01/12/2021 appointment noted: Adderall not helpful and stopped. Past being concerned about [name]'s comments about his focus.  Getting older. Stayed on Wellbutrin 450, lamotrigine 200, trazodone 100 HS.   No SE. Stress aunt driving him crazy but she's good to him.  Handled Xmas OK. Still wonders at times about bipolar bc irritability. Pt reports that mood is Anxious, Depressed, Dysphoric and Irritable but still trying to figure it out. Not a lot to do.  No friends to do things with.  Routine of a loner.  Describes anxiety as Moderate. Anxiety symptoms include: Excessive Worry,. Pt reports no sleep  issues. Pt reports that appetite is good. Pt reports that energy is lethargic and down slightly and loss of interest or pleasure in usual activities. Concentration is difficulty with focus and attention. Suicidal thoughts:  denied by patient.  Brain doesn't shut off.  .  Hard to push himself into hobbies too.  Unselttl;ed. Bored in retirement and lonely at home.  W preoccupied and consumed with developmentally disabled son [name] and ignores pt.  Even harder time keeping [name] and managing his emotions about it.  Some rumination and guilt on his thoughts. Plan: Reduce lamotrigine to 1 and 1/2 of the 100 mg tablets for 4 weeks, Then reduce lamotrigine to 1 of the tablets for 4 weeks,  Then reduce lamotrigine to 1/2 tablets for 2 weeks,  Then stop lamotrigine  05/14/21 appt noted: Dep OK but nothing to do. Can't stand [name] anymore, more irritating.   Not sleeping good with trazodone.  Trouble going to sleep.  To sleep 1230 and up 830.  Just got new CPAP and it helps. To sleep 2 am. Also on Wellbutrin xl 450 mg AM. NO SE Weaned off lamotrigine and no differences noticed. He feels the # eipisodes with W have lessened in frequency and intensity. Plan: Increase trazodone to 150 mg HS for sleep and might help mood.  07/24/21 appt noted: Took care of aunt for couple years and now caring for mother and son more.  Avoids wife as much as they can.  Gets frustrated with situation of mother and W is critical of him all the time.  Back against the wall.  Yesterday wife said I know you hate me bc I want be intimate with me.  No sex in 14 years.   Recognizes his anger at God over losses and burdens and his son's disabillity.   Emotionally reactive esp with wife who triggers anger.  Chronic tension with wife.  She gets upset with minor things at him.  Past Psychiatric Medication Trials: [name] 1.5, Abilify side effects in 2012, risperidone, [name] no response, Latuda 20 for 3 weeks NR lithium,  lamotrigine NR,  paroxetine, venlafaxine,  nortriptyline, duloxetine,  Pristiq, amitriptyline,, Ambien cognitive side effects, Lunesta cognitive side effects, trazodone, Xanax, buspirone [place], Nuvigil, Concerta, Vyvanse, Adderall NR  Review of Systems:  Review of Systems  HENT:  Positive for hearing loss.   Cardiovascular:  Negative for palpitations.  Musculoskeletal:  Positive for back pain.  Neurological:  Negative for dizziness, tremors and weakness.  Psychiatric/Behavioral:  Positive for agitation and dysphoric mood. Negative for behavioral problems, confusion, decreased concentration, hallucinations, self-injury, sleep disturbance and suicidal ideas. The patient is nervous/anxious. The patient is not hyperactive.  Medications: I have reviewed the patient's current medications.  Current Outpatient Medications  Medication Sig Dispense Refill   ALPRAZolam (XANAX) 0.5 MG tablet TAKE 1 TABLET BY MOUTH TWICE DAILY AS NEEDED FOR ANXIETY 60 tablet 5   atorvastatin (LIPITOR) 10 MG tablet Take 10 mg by mouth daily.     brimonidine (ALPHAGAN) 0.2 % ophthalmic solution Place 1 drop into both eyes 2 (two) times daily.      buPROPion (WELLBUTRIN XL) 150 MG 24 hr tablet Take 3 tablets (450 mg total) by mouth daily. 270 tablet 1   dorzolamide-timolol (COSOPT) 22.3-6.8 MG/ML ophthalmic solution Place 1 drop into both eyes 2 (two) times daily.     fluticasone (FLONASE) 50 MCG/ACT nasal spray Place 2 sprays into both nostrils daily as needed for allergies.   2   latanoprost (XALATAN) 0.005 % ophthalmic solution Place 1 drop into both eyes at bedtime.     OLANZapine (ZYPREXA) 10 MG tablet Take 1 tablet (10 mg total) by mouth at bedtime. 30 tablet 1   pantoprazole (PROTONIX) 40 MG tablet Take 1 tablet (40 mg total) by mouth 2 (two) times daily before a meal. 60 tablet 5   timolol (TIMOPTIC) 0.5 % ophthalmic solution 1 drop Two (2) times a day.     traZODone (DESYREL) 150 MG tablet Take 1 tablet (150  mg total) by mouth at bedtime. 90 tablet 3   vitamin B-12 (CYANOCOBALAMIN) 500 MCG tablet Take 500 mcg every evening by mouth.     No current facility-administered medications for this visit.    Medication Side Effects: None  Allergies:  Allergies  Allergen Reactions   Ambien [Zolpidem Tartrate]     Sleep walks   Bactrim [Sulfamethoxazole-Trimethoprim] Hives and Swelling   Dilaudid [Hydromorphone Hcl] Other (See Comments)    Made blood pressure drop with colon resection surgery in 2007    Morphine And Related Nausea And Vomiting    Past Medical History:  Diagnosis Date   Anemia    hx of    Arthritis    fingers    BPH (benign prostatic hyperplasia)    Colon cancer ([place])    stage II, diagnosed [id number]   Complication of anesthesia    vagal response after umbilcal hernia at [facility]   Depression    Diabetes mellitus    no as of 09/08/16 due to weight loss    GERD (gastroesophageal reflux disease)    Glaucoma 2012   H. pylori infection 10/2015   Documented eradication March 2017   History of kidney stones    Sleep apnea    wears CiPaP at night   Staph infection 2008   left side of face    Family History  Problem Relation Age of Onset   Lung cancer Father    Diabetes Mother    Anesthesia problems Neg Hx    Hypotension Neg Hx    Malignant hyperthermia Neg Hx    Pseudochol deficiency Neg Hx    Colon cancer Neg Hx     Social History   Socioeconomic History   Marital status: Married    Spouse name: Not on file   Number of children: 1   Years of education: Not on file   Highest education level: Not on file  Occupational History   Occupation: school system    Employer: [employer]  Tobacco Use   Smoking status: Never   Smokeless tobacco: Never   Tobacco comments:    Never smoked  Vaping Use  Vaping Use: Never used  Substance and Sexual Activity   Alcohol use: No    Alcohol/week: 0.0 standard drinks   Drug use: No   Sexual activity: Yes    Birth  control/protection: None  Other Topics Concern   Not on file  Social History Narrative   Not on file   Social Determinants of Health   Financial Resource Strain: Not on file  Food Insecurity: Not on file  Transportation Needs: Not on file  Physical Activity: Not on file  Stress: Not on file  Social Connections: Not on file  Intimate Partner Violence: Not on file    Past Medical History, Surgical history, Social history, and Family history were reviewed and updated as appropriate.   Please see review of systems for further details on the patient's review from today.   Objective:   Physical Exam:  There were no vitals taken for this visit.  Physical Exam Constitutional:      General: He is not in acute distress.    Appearance: He is well-developed.  Musculoskeletal:        General: No deformity.  Neurological:     Mental Status: He is alert and oriented to person, place, and time.     Cranial Nerves: No dysarthria.     Coordination: Coordination normal.  Psychiatric:        Attention and Perception: Perception normal. He is inattentive. He does not perceive auditory or visual hallucinations.        Mood and Affect: Mood is depressed. Mood is not anxious. Affect is not labile, blunt, angry, tearful or inappropriate.        Speech: Speech normal. Speech is not rapid and pressured.        Behavior: Behavior normal. Behavior is cooperative.        Thought Content: Thought content normal. Thought content is not paranoid or delusional. Thought content does not include homicidal or suicidal ideation. Thought content does not include homicidal or suicidal plan.        Cognition and Memory: Cognition and memory normal.        Judgment: Judgment normal.     Comments: Chronic dysthymia is unchanged.  He is not particularly anxious this time. Irritability is present.    Lab Review:     Component Value Date/Time   NA 141 12/30/2020 1137   K 4.4 12/30/2020 1137   CL 100  12/30/2020 1137   CO2 27 12/30/2020 1137   GLUCOSE 164 (H) 12/30/2020 1137   GLUCOSE 89 08/13/2019 1143   BUN 19 12/30/2020 1137   CREATININE 1.03 12/30/2020 1137   CREATININE 0.90 12/11/2019 0832   CALCIUM 9.4 12/30/2020 1137   PROT 6.4 (L) 08/13/2019 1143   ALBUMIN 3.7 08/13/2019 1143   AST 17 08/13/2019 1143   ALT 13 08/13/2019 1143   ALKPHOS 79 08/13/2019 1143   BILITOT 0.7 08/13/2019 1143   GFRNONAA 74 12/30/2020 1137   GFRAA 86 12/30/2020 1137       Component Value Date/Time   WBC 5.1 12/30/2020 1137   WBC 4.6 08/13/2019 1143   RBC 5.23 12/30/2020 1137   RBC 4.76 08/13/2019 1143   HGB 16.0 12/30/2020 1137   HCT 46.9 12/30/2020 1137   PLT 158 12/30/2020 1137   MCV 90 12/30/2020 1137   MCH 30.6 12/30/2020 1137   MCH 29.6 08/13/2019 1143   MCHC 34.1 12/30/2020 1137   MCHC 33.1 08/13/2019 1143   RDW 13.3 12/30/2020 1137   LYMPHSABS 1.2  12/30/2020 1137   MONOABS 0.9 07/31/2019 0235   EOSABS 0.1 12/30/2020 1137   BASOSABS 0.0 12/30/2020 1137    No results found for: POCLITH, LITHIUM   No results found for: PHENYTOIN, PHENOBARB, VALPROATE, CBMZ   Normal stress test.  No exercise.  Won't stick with diet. .res Assessment: Plan:    Episodic mood disorder ([place]) - Plan: OLANZapine (ZYPREXA) 10 MG tablet  Major depressive disorder, recurrent episode, moderate ([place])  Dysthymia  Generalized anxiety disorder  Attention deficit hyperactivity disorder (ADHD), combined type  Insomnia due to mental condition   As noted above [name] has treatment resistant major depression and dysthymia.  He wonders if he has bipolar disorder.  He has failed multiple medications listed above.  Latuda 20 failed and he doesn't want to try 40 mg daily.  This is an option. Stopped Latuda.  Supportive therapy on problems with home life.  Trying to motivate for the hobbies.  Disc anger turned inwards can be a source of depression.  Admits anger at God. 30 min discussion of anger and depression  and spirituality.  Xanax 0.5 mg bid prn anxiety. Using rarely for sleep.  It is worth noting that he was worse with irritability and edginess after reducing the Wellbutrin to 300 mg daily and better at 450 mg daily so we will continue Wellbutrin 450 mg daily.  Chronic attention problems remain and he is failed multiple ADD meds including off label things such as modafinil and Nuvigil .  His wife complains of his ADD symptoms as well.   No problem off lamotrigine.  Wants trial of olanzapine 10 mg pm for mood stability  This appt was 30 mins.  FU 2 mos  Lynder Parents, MD, DFAPA   Please see After Visit Summary for patient specific instructions.  Future Appointments  Date Time Provider [place]  09/17/2021  9:30 AM Cottle, Billey Co., MD [facility] None  02/04/2022 10:30 AM [facility] None  02/11/2022 10:30 AM Irine Seal, MD [facility] None    No orders of the defined types were placed in this encounter.     -------------------------------

## 2021-07-27 DIAGNOSIS — H60331 Swimmer's ear, right ear: Secondary | ICD-10-CM | POA: Diagnosis not present

## 2021-08-03 DIAGNOSIS — L03115 Cellulitis of right lower limb: Secondary | ICD-10-CM | POA: Diagnosis not present

## 2021-08-03 DIAGNOSIS — Z6832 Body mass index (BMI) 32.0-32.9, adult: Secondary | ICD-10-CM | POA: Diagnosis not present

## 2021-08-03 DIAGNOSIS — L03116 Cellulitis of left lower limb: Secondary | ICD-10-CM | POA: Diagnosis not present

## 2021-08-06 DIAGNOSIS — G4733 Obstructive sleep apnea (adult) (pediatric): Secondary | ICD-10-CM | POA: Diagnosis not present

## 2021-08-12 DIAGNOSIS — E538 Deficiency of other specified B group vitamins: Secondary | ICD-10-CM | POA: Diagnosis not present

## 2021-08-12 DIAGNOSIS — R5383 Other fatigue: Secondary | ICD-10-CM | POA: Diagnosis not present

## 2021-08-12 DIAGNOSIS — G4733 Obstructive sleep apnea (adult) (pediatric): Secondary | ICD-10-CM | POA: Diagnosis not present

## 2021-08-26 DIAGNOSIS — H401131 Primary open-angle glaucoma, bilateral, mild stage: Secondary | ICD-10-CM | POA: Diagnosis not present

## 2021-08-31 DIAGNOSIS — L570 Actinic keratosis: Secondary | ICD-10-CM | POA: Diagnosis not present

## 2021-08-31 DIAGNOSIS — L820 Inflamed seborrheic keratosis: Secondary | ICD-10-CM | POA: Diagnosis not present

## 2021-08-31 DIAGNOSIS — X32XXXD Exposure to sunlight, subsequent encounter: Secondary | ICD-10-CM | POA: Diagnosis not present

## 2021-08-31 DIAGNOSIS — L821 Other seborrheic keratosis: Secondary | ICD-10-CM | POA: Diagnosis not present

## 2021-09-01 DIAGNOSIS — M47812 Spondylosis without myelopathy or radiculopathy, cervical region: Secondary | ICD-10-CM | POA: Diagnosis not present

## 2021-09-01 DIAGNOSIS — M9901 Segmental and somatic dysfunction of cervical region: Secondary | ICD-10-CM | POA: Diagnosis not present

## 2021-09-02 DIAGNOSIS — M47812 Spondylosis without myelopathy or radiculopathy, cervical region: Secondary | ICD-10-CM | POA: Diagnosis not present

## 2021-09-02 DIAGNOSIS — M9901 Segmental and somatic dysfunction of cervical region: Secondary | ICD-10-CM | POA: Diagnosis not present

## 2021-09-06 DIAGNOSIS — G4733 Obstructive sleep apnea (adult) (pediatric): Secondary | ICD-10-CM | POA: Diagnosis not present

## 2021-09-07 DIAGNOSIS — M47812 Spondylosis without myelopathy or radiculopathy, cervical region: Secondary | ICD-10-CM | POA: Diagnosis not present

## 2021-09-07 DIAGNOSIS — M9901 Segmental and somatic dysfunction of cervical region: Secondary | ICD-10-CM | POA: Diagnosis not present

## 2021-09-12 DIAGNOSIS — G4733 Obstructive sleep apnea (adult) (pediatric): Secondary | ICD-10-CM | POA: Diagnosis not present

## 2021-09-17 ENCOUNTER — Ambulatory Visit: Payer: Medicare PPO | Admitting: Psychiatry

## 2021-09-17 ENCOUNTER — Encounter: Payer: Self-pay | Admitting: Psychiatry

## 2021-09-17 ENCOUNTER — Other Ambulatory Visit: Payer: Self-pay

## 2021-09-17 DIAGNOSIS — F341 Dysthymic disorder: Secondary | ICD-10-CM

## 2021-09-17 DIAGNOSIS — Z630 Problems in relationship with spouse or partner: Secondary | ICD-10-CM | POA: Diagnosis not present

## 2021-09-17 DIAGNOSIS — F39 Unspecified mood [affective] disorder: Secondary | ICD-10-CM

## 2021-09-17 DIAGNOSIS — F902 Attention-deficit hyperactivity disorder, combined type: Secondary | ICD-10-CM

## 2021-09-17 DIAGNOSIS — F331 Major depressive disorder, recurrent, moderate: Secondary | ICD-10-CM | POA: Diagnosis not present

## 2021-09-17 DIAGNOSIS — F411 Generalized anxiety disorder: Secondary | ICD-10-CM | POA: Diagnosis not present

## 2021-09-17 MED ORDER — ALPRAZOLAM 0.5 MG PO TABS: 0.5000 mg | ORAL_TABLET | Freq: Two times a day (BID) | ORAL | 5 refills | Status: DC | PRN

## 2021-09-17 MED ORDER — BUPROPION HCL ER (XL) 150 MG PO TB24: 450.0000 mg | ORAL_TABLET | Freq: Every day | ORAL | 1 refills | Status: DC

## 2021-09-17 NOTE — Progress Notes (Signed)
[name] [id number] Apr 23, 1951 70 y.o.     Subjective:   Patient ID:  [name] is a 70 y.o. (DOB 01-Oct-1951) male.  Chief Complaint:  Chief Complaint  Patient presents with   Follow-up   Depression   Stress    irritability    Depression        Associated symptoms include no decreased concentration and no suicidal ideas. [name] presents for follow-up of depression and medication changes.  At visit February 12, 2019.  He had become more depressed off of the [name] and it was recommended he restart [name] 1.5 mg daily.  He also had previously felt edgy and so we reduced the Wellbutrin from 450 to 300 mg daily in hopes of cutting down on that edgy feeling. He felt edgier after reducing the Wellbutrin to 300 so increased it back.    visit May 2020.  [name] was increased to 1.5 mg daily to try to help more consistently with depression and anxiety.   visit October 24, 2019.  [name] was stopped because it had no beneficial effect.  Adderall was added.   visit February 06, 2020 the following was noted: My highs are higher and lows are lower. Started noticing this more since January.   At times more irritable and needs Xanax.  Off Adderall for 2 mos DT NR.  Last 6 weeks had [name] more alone without wife.  Has a good time with [name].  Gets frustrated that wife complains a lot over what he does.  Don't lose my cool anywhere except at home.  Distracted big time.  Mind wanders.  Nothing is changed at home.  Has talked with wife about it.  They are both their for [name] only.  Unhappy with marriage.  I hate to go out of the world like this.   Nothing I can do about it. Lost 50# and feels good about it.  Has walked a lot and cut back eating.  Step dad died and helps with mother. More irritable lately ? Related to stopping the [name]. Call back if any symptoms are worse. In February 2021 the following was started: Zoloft trial for irritability and anxiety.  25 mg for 7 day  then 50 mg daily.  As of 04/01/20 the following noted: Wife irritable and not feeling well and hasn't noticed any effect of his starting Zoloft.  She gets mad over [name]'s care by pt. I've seen a little difference in calmer in situations at home with Zoloft 50.  No SE.  1 cup coffee and a drink a day.  Trazodone still helps sleep.    06/13/20 appt with the following noted: Adderall helping productivity and focus.  Wife notices Out of sertraline.  Couldn't tell a lot of difference with the Zoloft but not sure. No SE Adderall. I know I'm bipolar bc can get on a high and drop to a low.  Can be hyperverbal, hyper energetic.  Wife says he's stuck in the past. Wishes he could get rid of depression.  Can enjoy some things. Plan: DC sertraline bc minimal effect. Latuda 20 mg suggested. Take Latuda with at least 350 calories. Take 20 mg daily for 4 weeks. If no effect then increase to 40 mg daily.  08/18/20 appt with the following noted: No change with Latuda 20 that was any better and stopped it.  Not sure he took 40 mg daily. Seems like he's busier than he's been but also some free time.  Taking  care of Mo.  Cuts grass for 2 days.  Went to [name] to visit someone this weekend. Stress wife afraid constantly of Covid and he has to wear mask in his own house. Sleep better with trazodone. Plan: Adderall trial 15 BID  01/12/2021 appointment noted: Adderall not helpful and stopped. Past being concerned about [name]'s comments about his focus.  Getting older. Stayed on Wellbutrin 450, lamotrigine 200, trazodone 100 HS.   No SE. Stress aunt driving him crazy but she's good to him.  Handled Xmas OK. Still wonders at times about bipolar bc irritability. Pt reports that mood is Anxious, Depressed, Dysphoric and Irritable but still trying to figure it out. Not a lot to do.  No friends to do things with.  Routine of a loner.  Describes anxiety as Moderate. Anxiety symptoms include: Excessive Worry,. Pt  reports no sleep issues. Pt reports that appetite is good. Pt reports that energy is lethargic and down slightly and loss of interest or pleasure in usual activities. Concentration is difficulty with focus and attention. Suicidal thoughts:  denied by patient.  Brain doesn't shut off.  .  Hard to push himself into hobbies too.  Unselttl;ed. Bored in retirement and lonely at home.  W preoccupied and consumed with developmentally disabled son [name] and ignores pt.  Even harder time keeping [name] and managing his emotions about it.  Some rumination and guilt on his thoughts. Plan: Reduce lamotrigine to 1 and 1/2 of the 100 mg tablets for 4 weeks, Then reduce lamotrigine to 1 of the tablets for 4 weeks,  Then reduce lamotrigine to 1/2 tablets for 2 weeks,  Then stop lamotrigine  05/14/21 appt noted: Dep OK but nothing to do. Can't stand [name] anymore, more irritating.   Not sleeping good with trazodone.  Trouble going to sleep.  To sleep 1230 and up 830.  Just got new CPAP and it helps. To sleep 2 am. Also on Wellbutrin xl 450 mg AM. NO SE Weaned off lamotrigine and no differences noticed. He feels the # eipisodes with W have lessened in frequency and intensity. Plan: Increase trazodone to 150 mg HS for sleep and might help mood.  07/24/21 appt noted: Took care of aunt for couple years and now caring for mother and son more.  Avoids wife as much as they can.  Gets frustrated with situation of mother and W is critical of him all the time.  Back against the wall.  Yesterday wife said I know you hate me bc I want be intimate with me.  No sex in 64 years.   Recognizes his anger at God over losses and burdens and his son's disabillity.   Emotionally reactive esp with wife who triggers anger.  Chronic tension with wife.  She gets upset with minor things at him. Plan: Wants trial of olanzapine 10 mg pm for mood stability  09/17/21 appt noted: Olanzapine sedated and took 3 weeks.  [name] noticed him  sedated. Doing as good as I can do.  Residual irritability.  Rare Xanax but likes it.  I wish life could be different but it's not.  Playing golf more.  Need to lose weight. Wants to continue other meds.  Past Psychiatric Medication Trials: [name] 1.5, Abilify side effects in 2012, risperidone, [name] no response, Latuda 20 for 3 weeks NR, olanzapine 10 SE sed lithium, lamotrigine NR,  paroxetine, venlafaxine,  nortriptyline, duloxetine,  Pristiq, amitriptyline,, Ambien cognitive side effects, Lunesta cognitive side effects, trazodone, Xanax, buspirone Evekeo, Nuvigil,  Concerta, Vyvanse, Adderall NR  Review of Systems:  Review of Systems  HENT:  Positive for hearing loss.   Cardiovascular:  Positive for chest pain. Negative for palpitations.  Musculoskeletal:  Positive for back pain.  Neurological:  Negative for dizziness, tremors and weakness.  Psychiatric/Behavioral:  Positive for agitation and dysphoric mood. Negative for behavioral problems, confusion, decreased concentration, hallucinations, self-injury, sleep disturbance and suicidal ideas. The patient is nervous/anxious. The patient is not hyperactive.    Medications: I have reviewed the patient's current medications.  Current Outpatient Medications  Medication Sig Dispense Refill   atorvastatin (LIPITOR) 10 MG tablet Take 10 mg by mouth daily.     brimonidine (ALPHAGAN) 0.2 % ophthalmic solution Place 1 drop into both eyes 2 (two) times daily.      buPROPion (WELLBUTRIN XL) 150 MG 24 hr tablet Take 3 tablets (450 mg total) by mouth daily. 270 tablet 1   dorzolamide-timolol (COSOPT) 22.3-6.8 MG/ML ophthalmic solution Place 1 drop into both eyes 2 (two) times daily.     fluticasone (FLONASE) 50 MCG/ACT nasal spray Place 2 sprays into both nostrils daily as needed for allergies.   2   latanoprost (XALATAN) 0.005 % ophthalmic solution Place 1 drop into both eyes at bedtime.     pantoprazole (PROTONIX) 40 MG tablet Take 1 tablet (40  mg total) by mouth 2 (two) times daily before a meal. 60 tablet 5   timolol (TIMOPTIC) 0.5 % ophthalmic solution 1 drop Two (2) times a day.     traZODone (DESYREL) 150 MG tablet Take 1 tablet (150 mg total) by mouth at bedtime. 90 tablet 3   vitamin B-12 (CYANOCOBALAMIN) 500 MCG tablet Take 500 mcg every evening by mouth.     ALPRAZolam (XANAX) 0.5 MG tablet TAKE 1 TABLET BY MOUTH TWICE DAILY AS NEEDED FOR ANXIETY (Patient not taking: Reported on 09/17/2021) 60 tablet 5   No current facility-administered medications for this visit.    Medication Side Effects: None  Allergies:  Allergies  Allergen Reactions   Ambien [Zolpidem Tartrate]     Sleep walks   Bactrim [Sulfamethoxazole-Trimethoprim] Hives and Swelling   Dilaudid [Hydromorphone Hcl] Other (See Comments)    Made blood pressure drop with colon resection surgery in 2007    Morphine And Related Nausea And Vomiting    Past Medical History:  Diagnosis Date   Anemia    hx of    Arthritis    fingers    BPH (benign prostatic hyperplasia)    Colon cancer ([facility])    stage II, diagnosed 8341   Complication of anesthesia    vagal response after umbilcal hernia at [facility]   Depression    Diabetes mellitus    no as of 09/08/16 due to weight loss    GERD (gastroesophageal reflux disease)    Glaucoma 2012   H. pylori infection 10/2015   Documented eradication March 2017   History of kidney stones    Sleep apnea    wears CiPaP at night   Staph infection 2008   left side of face    Family History  Problem Relation Age of Onset   Lung cancer Father    Diabetes Mother    Anesthesia problems Neg Hx    Hypotension Neg Hx    Malignant hyperthermia Neg Hx    Pseudochol deficiency Neg Hx    Colon cancer Neg Hx     Social History   Socioeconomic History   Marital status: Married    Spouse  name: Not on file   Number of children: 1   Years of education: Not on file   Highest education level: Not on file  Occupational  History   Occupation: school system    Employer: [employer]  Tobacco Use   Smoking status: Never   Smokeless tobacco: Never   Tobacco comments:    Never smoked  Vaping Use   Vaping Use: Never used  Substance and Sexual Activity   Alcohol use: No    Alcohol/week: 0.0 standard drinks   Drug use: No   Sexual activity: Yes    Birth control/protection: None  Other Topics Concern   Not on file  Social History Narrative   Not on file   Social Determinants of Health   Financial Resource Strain: Not on file  Food Insecurity: Not on file  Transportation Needs: Not on file  Physical Activity: Not on file  Stress: Not on file  Social Connections: Not on file  Intimate Partner Violence: Not on file    Past Medical History, Surgical history, Social history, and Family history were reviewed and updated as appropriate.   Please see review of systems for further details on the patient's review from today.   Objective:   Physical Exam:  There were no vitals taken for this visit.  Physical Exam Constitutional:      General: He is not in acute distress.    Appearance: He is well-developed.  Musculoskeletal:        General: No deformity.  Neurological:     Mental Status: He is alert and oriented to person, place, and time.     Cranial Nerves: No dysarthria.     Coordination: Coordination normal.  Psychiatric:        Attention and Perception: Perception normal. He is inattentive. He does not perceive auditory or visual hallucinations.        Mood and Affect: Mood is depressed. Mood is not anxious. Affect is not labile, blunt, angry, tearful or inappropriate.        Speech: Speech normal. Speech is not rapid and pressured.        Behavior: Behavior normal. Behavior is cooperative.        Thought Content: Thought content normal. Thought content is not paranoid or delusional. Thought content does not include homicidal or suicidal ideation. Thought content does not include  homicidal or suicidal plan.        Cognition and Memory: Cognition and memory normal.        Judgment: Judgment normal.     Comments: Chronic dysthymia is unchanged.  He is not particularly anxious this time. Irritability is present and chronic.    Lab Review:     Component Value Date/Time   NA 141 12/30/2020 1137   K 4.4 12/30/2020 1137   CL 100 12/30/2020 1137   CO2 27 12/30/2020 1137   GLUCOSE 164 (H) 12/30/2020 1137   GLUCOSE 89 08/13/2019 1143   BUN 19 12/30/2020 1137   CREATININE 1.03 12/30/2020 1137   CREATININE 0.90 12/11/2019 0832   CALCIUM 9.4 12/30/2020 1137   PROT 6.4 (L) 08/13/2019 1143   ALBUMIN 3.7 08/13/2019 1143   AST 17 08/13/2019 1143   ALT 13 08/13/2019 1143   ALKPHOS 79 08/13/2019 1143   BILITOT 0.7 08/13/2019 1143   GFRNONAA 74 12/30/2020 1137   GFRAA 86 12/30/2020 1137       Component Value Date/Time   WBC 5.1 12/30/2020 1137   WBC 4.6 08/13/2019 1143  RBC 5.23 12/30/2020 1137   RBC 4.76 08/13/2019 1143   HGB 16.0 12/30/2020 1137   HCT 46.9 12/30/2020 1137   PLT 158 12/30/2020 1137   MCV 90 12/30/2020 1137   MCH 30.6 12/30/2020 1137   MCH 29.6 08/13/2019 1143   MCHC 34.1 12/30/2020 1137   MCHC 33.1 08/13/2019 1143   RDW 13.3 12/30/2020 1137   LYMPHSABS 1.2 12/30/2020 1137   MONOABS 0.9 07/31/2019 0235   EOSABS 0.1 12/30/2020 1137   BASOSABS 0.0 12/30/2020 1137    No results found for: POCLITH, LITHIUM   No results found for: PHENYTOIN, PHENOBARB, VALPROATE, CBMZ   Normal stress test.  No exercise.  Won't stick with diet. 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  He wonders if he has bipolar disorder.  He has failed multiple medications listed  above.  trial of olanzapine 10 mg too sedating so stopped.  Supportive therapy on problems with home life.  Trying to motivate for the hobbies.  Disc anger turned inwards can be a source of depression.  Admits anger at God. 30 min discussion of anger and depression and spirituality.  Caring for mother with brother.  Xanax 0.5 mg bid prn anxiety. Using rarely for sleep and anxiety.  It is worth noting that he was worse with irritability and edginess after reducing the Wellbutrin to 300 mg daily and better at 450 mg daily so we will continue Wellbutrin 450 mg daily.  Chronic attention problems remain and he is failed multiple ADD meds including off label things such as modafinil and Nuvigil .  His wife complains of his ADD symptoms as well.     Encourage more golf as much as possible..    Returned to trazodone 150 mg HS No indication for changes in meds per his request  This appt was 30 mins.  FU 2 mos  Lynder Parents, MD, DFAPA   Please see After Visit Summary for patient specific instructions.  Future Appointments  Date Time Provider Hope  02/04/2022 10:30 AM [facility] None  02/11/2022 10:30 AM Irine Seal, MD [facility] None    No orders of the defined types were placed in this encounter.     -------------------------------

## 2021-09-21 DIAGNOSIS — R079 Chest pain, unspecified: Secondary | ICD-10-CM | POA: Diagnosis not present

## 2021-09-22 DIAGNOSIS — E7849 Other hyperlipidemia: Secondary | ICD-10-CM | POA: Diagnosis not present

## 2021-09-22 DIAGNOSIS — I10 Essential (primary) hypertension: Secondary | ICD-10-CM | POA: Diagnosis not present

## 2021-09-22 DIAGNOSIS — R7301 Impaired fasting glucose: Secondary | ICD-10-CM | POA: Diagnosis not present

## 2021-09-22 DIAGNOSIS — E782 Mixed hyperlipidemia: Secondary | ICD-10-CM | POA: Diagnosis not present

## 2021-09-22 DIAGNOSIS — E119 Type 2 diabetes mellitus without complications: Secondary | ICD-10-CM | POA: Diagnosis not present

## 2021-10-06 DIAGNOSIS — G4733 Obstructive sleep apnea (adult) (pediatric): Secondary | ICD-10-CM | POA: Diagnosis not present

## 2021-10-08 DIAGNOSIS — I10 Essential (primary) hypertension: Secondary | ICD-10-CM | POA: Diagnosis not present

## 2021-10-08 DIAGNOSIS — E119 Type 2 diabetes mellitus without complications: Secondary | ICD-10-CM | POA: Diagnosis not present

## 2021-10-08 DIAGNOSIS — E538 Deficiency of other specified B group vitamins: Secondary | ICD-10-CM | POA: Diagnosis not present

## 2021-10-08 DIAGNOSIS — E782 Mixed hyperlipidemia: Secondary | ICD-10-CM | POA: Diagnosis not present

## 2021-10-08 DIAGNOSIS — E039 Hypothyroidism, unspecified: Secondary | ICD-10-CM | POA: Diagnosis not present

## 2021-10-08 DIAGNOSIS — E7849 Other hyperlipidemia: Secondary | ICD-10-CM | POA: Diagnosis not present

## 2021-10-12 DIAGNOSIS — G4733 Obstructive sleep apnea (adult) (pediatric): Secondary | ICD-10-CM | POA: Diagnosis not present

## 2021-10-13 DIAGNOSIS — E538 Deficiency of other specified B group vitamins: Secondary | ICD-10-CM | POA: Diagnosis not present

## 2021-10-13 DIAGNOSIS — H4050X0 Glaucoma secondary to other eye disorders, unspecified eye, stage unspecified: Secondary | ICD-10-CM | POA: Diagnosis not present

## 2021-10-13 DIAGNOSIS — M79641 Pain in right hand: Secondary | ICD-10-CM | POA: Diagnosis not present

## 2021-10-13 DIAGNOSIS — Z1331 Encounter for screening for depression: Secondary | ICD-10-CM | POA: Diagnosis not present

## 2021-10-13 DIAGNOSIS — Z1389 Encounter for screening for other disorder: Secondary | ICD-10-CM | POA: Diagnosis not present

## 2021-10-13 DIAGNOSIS — I10 Essential (primary) hypertension: Secondary | ICD-10-CM | POA: Diagnosis not present

## 2021-10-13 DIAGNOSIS — N401 Enlarged prostate with lower urinary tract symptoms: Secondary | ICD-10-CM | POA: Diagnosis not present

## 2021-10-13 DIAGNOSIS — Z0001 Encounter for general adult medical examination with abnormal findings: Secondary | ICD-10-CM | POA: Diagnosis not present

## 2021-10-13 DIAGNOSIS — Z85038 Personal history of other malignant neoplasm of large intestine: Secondary | ICD-10-CM | POA: Diagnosis not present

## 2021-10-21 DIAGNOSIS — H60331 Swimmer's ear, right ear: Secondary | ICD-10-CM | POA: Diagnosis not present

## 2021-10-21 NOTE — Progress Notes (Signed)
Cardiology Office Note:    Date:  10/22/2021   ID:  [name], DOB 01/10/1951, MRN [id number]  PCP:  Rory Percy, MD   [place] Providers Cardiologist:  Werner Lean, MD     Referring MD: Bonnita Hollow, MD   CC: Back, chest and arm pain Consulted for the evaluation of CP at the behest of Rory Percy, MD  History of Present Illness:    [name] is a 70 y.o. male with a hx of OSA on CPAP, T2DM, Aortic atherosclerosis prior history of colon cancer who presents for evaluation 10/22/21.  Patient notes that he is feeling what he thinks to be musculoskeletal pain.  For two months he has had chest pain and back pain that comes and goes.  Can pick up the weed eater without DOE or chest discomfort.  He notes that some of his discomfort occurs with certain movements.  Hurt his had playing golf but no recent injuries.  No chest discomfort is a sharp twinge.  He can push move 1/3 of an acre without symptoms. Sharp pain has resolved. No shortness of breath, DOE is only rarely.  No PND or orthopnea.  No weight gain, leg swelling , or abdominal swelling.  No syncope or near syncope . Notes  no palpitations or funny heart beats.   No leg claudication.  Past Medical History:  Diagnosis Date   Anemia    hx of    Arthritis    fingers    BPH (benign prostatic hyperplasia)    Colon cancer ([place])    stage II, diagnosed 6568   Complication of anesthesia    vagal response after umbilcal hernia at [facility]   Depression    Diabetes mellitus    no as of 09/08/16 due to weight loss    GERD (gastroesophageal reflux disease)    Glaucoma 2012   H. pylori infection 10/2015   Documented eradication March 2017   History of kidney stones    Sleep apnea    wears CiPaP at night   Staph infection 2008   left side of face    Past Surgical History:  Procedure Laterality Date   AGILE CAPSULE N/A 10/29/2019   Procedure: AGILE CAPSULE;  Surgeon: Daneil Dolin, MD;  [facility];  Service: Endoscopy;  Laterality: N/A;  7:30am   APPENDECTOMY     BIOPSY  10/11/2019   Procedure: BIOPSY;  Surgeon: Daneil Dolin, MD;  [facility];  Service: Endoscopy;;  colon   CARDIAC CATHETERIZATION     COLECTOMY  2007   right hemicolectomy   COLONOSCOPY  05/11/10   normal rectum,normal residual colon with some angry appearring anastomtic mucosa with some erosions and oozing, bx unremarkable   COLONOSCOPY  05/16/2012   Dr. Gala Romney- normal rectum, sigmoid diverticulosis, anastomotic ulcers. bx= acute ulcer and benign colonic mucosa with intramucosal lympoid aggregates   COLONOSCOPY N/A 11/17/2016   Dr. Gala Romney: Ulcerated mucosa were present at the anastomosis. benign bx. next TCS in 10/2021.   COLONOSCOPY WITH PROPOFOL N/A 10/11/2019   Dr. Gala Romney: Diverticulosis in the sigmoid colon and descending colon.  Focal ulcerations distal neoterminal ileum uncertain significance.  Biopsies were benign with no evidence of malignancy.  Givens capsule completed after colonoscopy.  5-year surveillance colonoscopy advised.   ESOPHAGEAL DILATION N/A 10/28/2015   Procedure: ESOPHAGEAL DILATION;  Surgeon: Daneil Dolin, MD;  [facility];  Service: Endoscopy;  Laterality: N/A;   ESOPHAGOGASTRODUODENOSCOPY  N/A 10/28/2015   RMR: ulcerated proximal esophageal mass ad described status post biopsy. Abnormal distal esophagus biopsy. status post biopsy. Hiatal hernia . abnormal gastric mucosa doubt clinical significance status post biopsy   ESOPHAGOGASTRODUODENOSCOPY (EGD) WITH PROPOFOL N/A 11/07/2017   Dr. Gala Romney: Normal study.     EUS N/A 11/20/2015   Procedure: UPPER ENDOSCOPIC ULTRASOUND (EUS) LINEAR;  Surgeon: Milus Banister, MD;  [facility];  Service: Endoscopy;  Laterality: N/A;   GIVENS CAPSULE STUDY N/A 11/12/2019   Scattered erosions throughout the small bowel.  Ulceration again seen at the anastomosis.  No active bleeding.  Question ischemia   HEMORRHOID  SURGERY  12/31/2011   Procedure: HEMORRHOIDECTOMY;  Surgeon: Jamesetta So;  Location: [facility];  Service: General;  Laterality: N/A;   HERNIA REPAIR     umbilical   KIDNEY STONE SURGERY     PILONIDAL CYST / SINUS EXCISION  1976   SKIN LESION EXCISION     left shoulder/pre cancerous   TRANSURETHRAL RESECTION OF PROSTATE N/A 09/09/2016   Procedure: TRANSURETHRAL RESECTION OF THE PROSTATE (TURP);  Surgeon: Irine Seal, MD;  [facility];  Service: Urology;  Laterality: N/A;    Current Medications: Current Meds  Medication Sig   ALPRAZolam (XANAX) 0.5 MG tablet Take 1 tablet (0.5 mg total) by mouth 2 (two) times daily as needed. for anxiety   aspirin EC 81 MG tablet Take 81 mg by mouth daily. Swallow whole.   atorvastatin (LIPITOR) 10 MG tablet Take 10 mg by mouth daily.   brimonidine (ALPHAGAN) 0.2 % ophthalmic solution Place 1 drop into both eyes 2 (two) times daily.    buPROPion (WELLBUTRIN XL) 150 MG 24 hr tablet Take 3 tablets (450 mg total) by mouth daily.   dorzolamide-timolol (COSOPT) 22.3-6.8 MG/ML ophthalmic solution Place 1 drop into both eyes 2 (two) times daily.   fluticasone (FLONASE) 50 MCG/ACT nasal spray Place 2 sprays into both nostrils daily as needed for allergies.    latanoprost (XALATAN) 0.005 % ophthalmic solution Place 1 drop into both eyes at bedtime.   pantoprazole (PROTONIX) 40 MG tablet Take 1 tablet (40 mg total) by mouth 2 (two) times daily before a meal.   timolol (TIMOPTIC) 0.5 % ophthalmic solution 1 drop Two (2) times a day.   traZODone (DESYREL) 150 MG tablet Take 1 tablet (150 mg total) by mouth at bedtime.   vitamin B-12 (CYANOCOBALAMIN) 500 MCG tablet Take 500 mcg every evening by mouth.     Allergies:   Ambien [zolpidem tartrate], Bactrim [sulfamethoxazole-trimethoprim], Dilaudid [hydromorphone hcl], and Morphine and related   Social History   Socioeconomic History   Marital status: Married    Spouse name: Not on file   Number of children: 1    Years of education: Not on file   Highest education level: Not on file  Occupational History   Occupation: school system    Employer: [employer]  Tobacco Use   Smoking status: Never   Smokeless tobacco: Never   Tobacco comments:    Never smoked  Vaping Use   Vaping Use: Never used  Substance and Sexual Activity   Alcohol use: No    Alcohol/week: 0.0 standard drinks   Drug use: No   Sexual activity: Yes    Birth control/protection: None  Other Topics Concern   Not on file  Social History Narrative   Not on file   Social Determinants of Health   Financial Resource Strain: Not on file  Food  Insecurity: Not on file  Transportation Needs: Not on file  Physical Activity: Not on file  Stress: Not on file  Social Connections: Not on file    Social: taught school for 42 years; coached basketball, plays golf; son has cerebal palsy and is at home  Family History: The patient's family history includes Diabetes in his mother; Lung cancer in his father. There is no history of Anesthesia problems, Hypotension, Malignant hyperthermia, Pseudochol deficiency, or Colon cancer. Grandmother and grandfathers had MI.  ROS:   Please see the history of present illness.     All other systems reviewed and are negative.  EKGs/Labs/Other Studies Reviewed:    The following studies were reviewed today:  EKG:  EKG is  ordered today.  The ekg ordered today demonstrates  10/22/21: Sinus bradycardia 1st HB with lead reversal  Transthoracic Echocardiogram: Date: 12/14/2012 Results: Study Conclusions   - Left ventricle: The cavity size was normal. Wall thickness    was increased in a pattern of mild LVH. Systolic function    was vigorous. The estimated ejection fraction was in the    range of 65% to 70%. Wall motion was normal; there were no    regional wall motion abnormalities. Doppler parameters are    consistent with abnormal left ventricular relaxation    (grade 1 diastolic  dysfunction).  - Mitral valve: Trivial regurgitation.  - Tricuspid valve: Trivial regurgitation.  - Pericardium, extracardiac: There was no pericardial    effusion.   PET- non cardiac  Date:01/01/2015 Results: Aortic Atherosclerosis and LM and LAD CAC  Per OSH Records Saw Dr. Olevia Bowens in 2019 (cardiology [facility]) Negative Stress test 10/24/2018 LHC 2005 no disease  Recent Labs: 12/30/2020: BUN 19; Creatinine, Ser 1.03; Hemoglobin 16.0; Platelets 158; Potassium 4.4; Sodium 141  Recent Lipid Panel No results found for: CHOL, TRIG, HDL, CHOLHDL, VLDL, LDLCALC, LDLDIRECT      Physical Exam:    VS:  BP 126/80   Pulse (!) 54   Ht 5\' 9"  (1.753 m)   Wt 217 lb 12.8 oz (98.8 kg)   SpO2 98%   BMI 32.16 kg/m     Wt Readings from Last 3 Encounters:  10/22/21 217 lb 12.8 oz (98.8 kg)  05/12/21 219 lb 3.2 oz (99.4 kg)  02/12/21 216 lb (98 kg)     GEN: Well nourished, well developed in no acute distress HEENT: R ear Frank's sign NECK: No JVD LYMPHATICS: No lymphadenopathy CARDIAC: RRR, no murmurs, rubs, gallops RESPIRATORY:  Clear to auscultation without rales, wheezing or rhonchi  ABDOMEN: Soft, non-tender, non-distended MUSCULOSKELETAL:  No edema; No deformity  SKIN: Warm and dry NEUROLOGIC:  Alert and oriented x 3 PSYCHIATRIC:  Normal affect   ASSESSMENT:    1. Chest pain, unspecified type   2. OSA (obstructive sleep apnea)   3. Aortic atherosclerosis (HCC)    PLAN:    Precordial Pain- possibly cardiac  OSA on CPAP Aortic Atherosclerosis and CAC DM - possible cardiac CP with equivocal stress test in 2019 (unable to augment heart rate for diagnostic test) - reviewed cardiac CT with patient - Would recommend CCTA with possible FFR as needed to exclude obstructive CAD and to assess for non-obstructive CAD requiring secondary prevention - do not suspect we will need high tube current - low heart rate with some degree of chronotropic incompetence in 2019 will not add BB -  creatinine 10/17/21 was 0.99 will not need BMP - LDL at goal (54) - No change in medications (may  add ASA based on results) continue 10 mg PO daily  March 2023 f/u  Medication Adjustments/Labs and Tests Ordered: Current medicines are reviewed at length with the patient today.  Concerns regarding medicines are outlined above.  Orders Placed This Encounter  Procedures   EKG 12-Lead   No orders of the defined types were placed in this encounter.   Patient Instructions  Medication Instructions:  Your physician recommends that you continue on your current medications as directed. Please refer to the Current Medication list given to you today.  *If you need a refill on your cardiac medications before your next appointment, please call your pharmacy*   Lab Work: None ordered  If you have labs (blood work) drawn today and your tests are completely normal, you will receive your results only by: [place] (if you have MyChart) OR A paper copy in the mail If you have any lab test that is abnormal or we need to change your treatment, we will call you to review the results.   Testing/Procedures: Your physician has requested that you have a lexiscan myoview. For further information please visit [email or web address].[email or web address]. Please follow instruction sheet, BELOW:      Follow-Up: At [facility], you and your health needs are our priority.  As part of our continuing mission to provide you with exceptional heart care, we have created designated Provider Care Teams.  These Care Teams include your primary Cardiologist (physician) and Advanced Practice Providers (APPs -  Physician Assistants and Nurse Practitioners) who all work together to provide you with the care you need, when you need it.  We recommend signing up for the patient portal called "MyChart".  Sign up information is provided on this After Visit Summary.  MyChart is used to connect with patients for Virtual Visits (Telemedicine).   Patients are able to view lab/test results, encounter notes, upcoming appointments, etc.  Non-urgent messages can be sent to your provider as well.   To learn more about what you can do with MyChart, go to [email or web address].    Your next appointment:   6 month(s)  The format for your next appointment:   In Person  Provider:   You may see Werner Lean, MD or one of the following Advanced Practice Providers on your designated Care Team:   Bernerd Pho, PA-C  Ermalinda Barrios, PA-C    Other Instructions    Signed, Werner Lean, MD  10/22/2021 10:28 AM    [place]

## 2021-10-22 ENCOUNTER — Other Ambulatory Visit: Payer: Self-pay

## 2021-10-22 ENCOUNTER — Encounter: Payer: Self-pay | Admitting: Internal Medicine

## 2021-10-22 ENCOUNTER — Ambulatory Visit: Payer: Medicare PPO | Admitting: Internal Medicine

## 2021-10-22 VITALS — BP 126/80 | HR 54 | Ht 69.0 in | Wt 217.8 lb

## 2021-10-22 DIAGNOSIS — R072 Precordial pain: Secondary | ICD-10-CM | POA: Diagnosis not present

## 2021-10-22 DIAGNOSIS — R079 Chest pain, unspecified: Secondary | ICD-10-CM | POA: Diagnosis not present

## 2021-10-22 DIAGNOSIS — I700 Atherosclerosis of aorta: Secondary | ICD-10-CM

## 2021-10-22 DIAGNOSIS — G4733 Obstructive sleep apnea (adult) (pediatric): Secondary | ICD-10-CM

## 2021-10-22 NOTE — Patient Instructions (Addendum)
Medication Instructions:  Your physician recommends that you continue on your current medications as directed. Please refer to the Current Medication list given to you today.  *If you need a refill on your cardiac medications before your next appointment, please call your pharmacy*   Lab Work: None ordered  If you have labs (blood work) drawn today and your tests are completely normal, you will receive your results only by: [place] (if you have MyChart) OR A paper copy in the mail If you have any lab test that is abnormal or we need to change your treatment, we will call you to review the results.   Testing/Procedures: Your physician has requested that you have cardiac CT. Cardiac computed tomography (CT) is a painless test that uses an x-ray machine to take clear, detailed pictures of your heart. For further information please visit [email or web address]. Please follow instruction sheet BELOW:     Your cardiac CT will be scheduled at one of the below locations:   [facility] [address] [place], [place] [address] [phone]  At [facility], please arrive at the [facility] main entrance (entrance A) of [facility] 30 minutes prior to test start time. You can use the FREE valet parking offered at the main entrance (encouraged to control the heart rate for the test) Proceed to the [facility] Department (first floor) to check-in and test prep.  Please follow these instructions carefully (unless otherwise directed):  Hold all erectile dysfunction medications at least 3 days (72 hrs) prior to test.  On the Night Before the Test: Be sure to Drink plenty of water. Do not consume any caffeinated/decaffeinated beverages or chocolate 12 hours prior to your test. Do not take any antihistamines 12 hours prior to your test.  On the Day of the Test: Drink plenty of water until 1 hour prior to the test. Do not eat any food 4 hours prior  to the test. You may take your regular medications prior to the test.      After the Test: Drink plenty of water. After receiving IV contrast, you may experience a mild flushed feeling. This is normal. On occasion, you may experience a mild rash up to 24 hours after the test. This is not dangerous. If this occurs, you can take Benadryl 25 mg and increase your fluid intake. If you experience trouble breathing, this can be serious. If it is severe call 911 IMMEDIATELY. If it is mild, please call our office. If you take any of these medications: Glipizide/Metformin, Avandament, Glucavance, please do not take 48 hours after completing test unless otherwise instructed.  Please allow 2-4 weeks for scheduling of routine cardiac CTs. Some insurance companies require a pre-authorization which may delay scheduling of this test.   For non-scheduling related questions, please contact the cardiac imaging nurse navigator should you have any questions/concerns: Marchia Bond, Cardiac Imaging Nurse Navigator Gordy Clement, Cardiac Imaging Nurse Navigator [facility] Direct Office Dial: [phone]   For scheduling needs, including cancellations and rescheduling, please call [place], [phone].     Follow-Up: At [facility], you and your health needs are our priority.  As part of our continuing mission to provide you with exceptional heart care, we have created designated Provider Care Teams.  These Care Teams include your primary Cardiologist (physician) and Advanced Practice Providers (APPs -  Physician Assistants and Nurse Practitioners) who all work together to provide you with the care you need, when  you need it.  We recommend signing up for the patient portal called "MyChart".  Sign up information is provided on this After Visit Summary.  MyChart is used to connect with patients for Virtual Visits (Telemedicine).  Patients are able to view lab/test results,  encounter notes, upcoming appointments, etc.  Non-urgent messages can be sent to your provider as well.   To learn more about what you can do with MyChart, go to [email or web address].    Your next appointment:   In March 2023  The format for your next appointment:   In Person  Provider:   You may see Werner Lean, MD or one of the following Advanced Practice Providers on your designated Care Team:     Other Instructions

## 2021-10-26 ENCOUNTER — Ambulatory Visit: Payer: Medicare PPO

## 2021-10-26 ENCOUNTER — Ambulatory Visit: Payer: Medicare PPO | Admitting: Orthopedic Surgery

## 2021-10-26 ENCOUNTER — Encounter: Payer: Self-pay | Admitting: Orthopedic Surgery

## 2021-10-26 ENCOUNTER — Other Ambulatory Visit: Payer: Self-pay

## 2021-10-26 VITALS — BP 145/73 | HR 61 | Ht 69.0 in | Wt 220.0 lb

## 2021-10-26 DIAGNOSIS — M25531 Pain in right wrist: Secondary | ICD-10-CM

## 2021-10-26 DIAGNOSIS — M79641 Pain in right hand: Secondary | ICD-10-CM | POA: Diagnosis not present

## 2021-10-26 DIAGNOSIS — S60221A Contusion of right hand, initial encounter: Secondary | ICD-10-CM

## 2021-10-26 DIAGNOSIS — S62154A Nondisplaced fracture of hook process of hamate [unciform] bone, right wrist, initial encounter for closed fracture: Secondary | ICD-10-CM | POA: Diagnosis not present

## 2021-10-26 NOTE — Progress Notes (Signed)
EVALUATION AND MANAGEMENT   Type of appointment : New patient  PLAN: 70 year old male has a nondisplaced fracture at the tip of his hamate which is getting better and is now into 3 months since the injury  This should get better on its own  We did discuss procedures to excise the fragment but it is nondisplaced.  I would have to send to hand surgeon as I have no experience doing that particular procedure and there are some neurovascular structures nearby  We will see him again in about 6 to 8 weeks just to make sure we are continuing to get better.  If surgery were planned CT scan or MRI would be needed to evaluate the injury as well  No orders of the defined types were placed in this encounter.    Chief Complaint  Patient presents with   Hand Pain    Right since mid August injury playing golf     70 year old male ground at a club playing golf complains of pain on the palmar aspect of his right hand ulnar to the midline.  August 2022 date of injury   Review of Systems  All other systems reviewed and are negative.   Body mass index is 32.49 kg/m.  Physical Exam Constitutional:      General: He is not in acute distress.    Appearance: He is well-developed.     Comments: Well developed, well nourished Normal grooming and hygiene     Cardiovascular:     Comments: No peripheral edema Musculoskeletal:     Comments: Both hands are examined he is tender over the palm near the hamate on the right not on the left otherwise full range of motion  Notes that when he tries to extend his MP joints he feels the pain but it is all localized to the ulnar side of the palm proximally near the hamate  No other abnormalities are noted nontender wrist joint and ulnar head and radial ulnar joint  Skin:    General: Skin is warm and dry.  Neurological:     Mental Status: He is alert and oriented to person, place, and time.     Sensory: No sensory deficit.     Coordination: Coordination  normal.     Gait: Gait normal.     Deep Tendon Reflexes: Reflexes are normal and symmetric.  Psychiatric:        Mood and Affect: Mood normal.        Behavior: Behavior normal.        Thought Content: Thought content normal.        Judgment: Judgment normal.     Comments: Affect normal     Past Medical History:  Diagnosis Date   Anemia    hx of    Arthritis    fingers    BPH (benign prostatic hyperplasia)    Colon cancer ([place])    stage II, diagnosed 9381   Complication of anesthesia    vagal response after umbilcal hernia at [facility]   Depression    Diabetes mellitus    no as of 09/08/16 due to weight loss    GERD (gastroesophageal reflux disease)    Glaucoma 2012   H. pylori infection 10/2015   Documented eradication March 2017   History of kidney stones    Sleep apnea    wears CiPaP at night   Staph infection 2008   left side of face   Past Surgical History:  Procedure Laterality Date  AGILE CAPSULE N/A 10/29/2019   Procedure: AGILE CAPSULE;  Surgeon: Daneil Dolin, MD;  [facility];  Service: Endoscopy;  Laterality: N/A;  7:30am   APPENDECTOMY     BIOPSY  10/11/2019   Procedure: BIOPSY;  Surgeon: Daneil Dolin, MD;  [facility];  Service: Endoscopy;;  colon   CARDIAC CATHETERIZATION     COLECTOMY  2007   right hemicolectomy   COLONOSCOPY  05/11/10   normal rectum,normal residual colon with some angry appearring anastomtic mucosa with some erosions and oozing, bx unremarkable   COLONOSCOPY  05/16/2012   Dr. Gala Romney- normal rectum, sigmoid diverticulosis, anastomotic ulcers. bx= acute ulcer and benign colonic mucosa with intramucosal lympoid aggregates   COLONOSCOPY N/A 11/17/2016   Dr. Gala Romney: Ulcerated mucosa were present at the anastomosis. benign bx. next TCS in 10/2021.   COLONOSCOPY WITH PROPOFOL N/A 10/11/2019   Dr. Gala Romney: Diverticulosis in the sigmoid colon and descending colon.  Focal ulcerations distal neoterminal ileum uncertain  significance.  Biopsies were benign with no evidence of malignancy.  Givens capsule completed after colonoscopy.  5-year surveillance colonoscopy advised.   ESOPHAGEAL DILATION N/A 10/28/2015   Procedure: ESOPHAGEAL DILATION;  Surgeon: Daneil Dolin, MD;  [facility];  Service: Endoscopy;  Laterality: N/A;   ESOPHAGOGASTRODUODENOSCOPY N/A 10/28/2015   RMR: ulcerated proximal esophageal mass ad described status post biopsy. Abnormal distal esophagus biopsy. status post biopsy. Hiatal hernia . abnormal gastric mucosa doubt clinical significance status post biopsy   ESOPHAGOGASTRODUODENOSCOPY (EGD) WITH PROPOFOL N/A 11/07/2017   Dr. Gala Romney: Normal study.     EUS N/A 11/20/2015   Procedure: UPPER ENDOSCOPIC ULTRASOUND (EUS) LINEAR;  Surgeon: Milus Banister, MD;  [facility];  Service: Endoscopy;  Laterality: N/A;   GIVENS CAPSULE STUDY N/A 11/12/2019   Scattered erosions throughout the small bowel.  Ulceration again seen at the anastomosis.  No active bleeding.  Question ischemia   HEMORRHOID SURGERY  12/31/2011   Procedure: HEMORRHOIDECTOMY;  Surgeon: Jamesetta So;  Location: [facility];  Service: General;  Laterality: N/A;   HERNIA REPAIR     umbilical   KIDNEY STONE SURGERY     PILONIDAL CYST / SINUS EXCISION  1976   SKIN LESION EXCISION     left shoulder/pre cancerous   TRANSURETHRAL RESECTION OF PROSTATE N/A 09/09/2016   Procedure: TRANSURETHRAL RESECTION OF THE PROSTATE (TURP);  Surgeon: Irine Seal, MD;  [facility];  Service: Urology;  Laterality: N/A;   Social History   Tobacco Use   Smoking status: Never   Smokeless tobacco: Never   Tobacco comments:    Never smoked  Vaping Use   Vaping Use: Never used  Substance Use Topics   Alcohol use: No    Alcohol/week: 0.0 standard drinks   Drug use: No     Assessment and Plan:  Encounter Diagnoses  Name Primary?   Contusion of right hand, initial encounter Yes   Pain in right hand    Pain in right wrist      Hook of hamate nondisplaced fracture continue nonoperative treatment follow-up in 6 to 8 weeks

## 2021-11-03 ENCOUNTER — Telehealth (HOSPITAL_COMMUNITY): Payer: Self-pay | Admitting: Emergency Medicine

## 2021-11-03 ENCOUNTER — Other Ambulatory Visit: Payer: Self-pay

## 2021-11-03 ENCOUNTER — Other Ambulatory Visit (HOSPITAL_COMMUNITY): Payer: Self-pay | Admitting: Emergency Medicine

## 2021-11-03 ENCOUNTER — Other Ambulatory Visit (HOSPITAL_COMMUNITY)
Admission: RE | Admit: 2021-11-03 | Discharge: 2021-11-03 | Disposition: A | Discharge status: Home / Self Care | Payer: Medicare PPO | Source: Ambulatory Visit | Attending: Internal Medicine | Admitting: Internal Medicine

## 2021-11-03 ENCOUNTER — Other Ambulatory Visit: Payer: Medicare PPO

## 2021-11-03 DIAGNOSIS — R079 Chest pain, unspecified: Secondary | ICD-10-CM | POA: Diagnosis not present

## 2021-11-03 LAB — BASIC METABOLIC PANEL
Anion gap: 7 (ref 5–15)
BUN: 16 mg/dL (ref 8–23)
CO2: 26 mmol/L (ref 22–32)
Calcium: 8.9 mg/dL (ref 8.9–10.3)
Chloride: 103 mmol/L (ref 98–111)
Creatinine, Ser: 1.08 mg/dL (ref 0.61–1.24)
GFR, Estimated: >60 mL/min (ref >60)
Glucose, Bld: 106 mg/dL — ABNORMAL HIGH (ref 70–99)
Potassium: 4.4 mmol/L (ref 3.5–5.1)
Sodium: 136 mmol/L (ref 135–145)

## 2021-11-03 NOTE — Telephone Encounter (Signed)
Reaching out to patient to offer assistance regarding upcoming cardiac imaging study; pt verbalizes understanding of appt date/time, parking situation and where to check in, pre-test NPO status and medications ordered, and verified current allergies; name and call back number provided for further questions should they arise Marchia Bond RN Navigator Cardiac Imaging [facility] [phone] office [phone] cell  Getting labs today Not taking BB, HR 61 Denies iv issues

## 2021-11-03 NOTE — Progress Notes (Signed)
BMP ordered for CCTA

## 2021-11-05 ENCOUNTER — Ambulatory Visit (HOSPITAL_COMMUNITY)
Admission: RE | Admit: 2021-11-05 | Discharge: 2021-11-05 | Disposition: A | Discharge status: Home / Self Care | Payer: Medicare PPO | Source: Ambulatory Visit | Attending: Internal Medicine | Admitting: Internal Medicine

## 2021-11-05 ENCOUNTER — Encounter (HOSPITAL_COMMUNITY): Payer: Self-pay

## 2021-11-05 ENCOUNTER — Other Ambulatory Visit: Payer: Self-pay

## 2021-11-05 DIAGNOSIS — R079 Chest pain, unspecified: Secondary | ICD-10-CM | POA: Diagnosis not present

## 2021-11-05 DIAGNOSIS — R072 Precordial pain: Secondary | ICD-10-CM | POA: Insufficient documentation

## 2021-11-05 DIAGNOSIS — I700 Atherosclerosis of aorta: Secondary | ICD-10-CM | POA: Diagnosis not present

## 2021-11-05 MED ORDER — NITROGLYCERIN 0.4 MG SL SUBL
0.8000 mg | SUBLINGUAL_TABLET | Freq: Once | SUBLINGUAL | Status: AC
  Administered 2021-11-05: 11:00:00 0.8 mg via SUBLINGUAL

## 2021-11-05 MED ORDER — NITROGLYCERIN 0.4 MG SL SUBL
SUBLINGUAL_TABLET | SUBLINGUAL | Status: AC
  Filled 2021-11-05: qty 2

## 2021-11-05 MED ORDER — IOHEXOL 350 MG/ML SOLN
95.0000 mL | Freq: Once | INTRAVENOUS | Status: AC | PRN
  Administered 2021-11-05: 11:00:00 95 mL via INTRAVENOUS

## 2021-11-09 ENCOUNTER — Telehealth: Payer: Self-pay | Admitting: *Deleted

## 2021-11-09 DIAGNOSIS — Z1283 Encounter for screening for malignant neoplasm of skin: Secondary | ICD-10-CM | POA: Diagnosis not present

## 2021-11-09 DIAGNOSIS — L570 Actinic keratosis: Secondary | ICD-10-CM | POA: Diagnosis not present

## 2021-11-09 DIAGNOSIS — X32XXXD Exposure to sunlight, subsequent encounter: Secondary | ICD-10-CM | POA: Diagnosis not present

## 2021-11-09 DIAGNOSIS — D225 Melanocytic nevi of trunk: Secondary | ICD-10-CM | POA: Diagnosis not present

## 2021-11-09 NOTE — Telephone Encounter (Signed)
Pt returning call for results... please advise  

## 2021-11-09 NOTE — Telephone Encounter (Signed)
Confirmed that pt did receive lab and CT results via [place].

## 2021-11-26 DIAGNOSIS — H9012 Conductive hearing loss, unilateral, left ear, with unrestricted hearing on the contralateral side: Secondary | ICD-10-CM | POA: Diagnosis not present

## 2021-11-26 DIAGNOSIS — H60332 Swimmer's ear, left ear: Secondary | ICD-10-CM | POA: Diagnosis not present

## 2021-12-07 ENCOUNTER — Encounter: Payer: Self-pay | Admitting: Orthopedic Surgery

## 2021-12-07 ENCOUNTER — Ambulatory Visit (INDEPENDENT_AMBULATORY_CARE_PROVIDER_SITE_OTHER): Payer: Medicare PPO | Admitting: Orthopedic Surgery

## 2021-12-07 ENCOUNTER — Other Ambulatory Visit: Payer: Self-pay

## 2021-12-07 DIAGNOSIS — M65341 Trigger finger, right ring finger: Secondary | ICD-10-CM

## 2021-12-07 DIAGNOSIS — S62154G Nondisplaced fracture of hook process of hamate [unciform] bone, right wrist, subsequent encounter for fracture with delayed healing: Secondary | ICD-10-CM

## 2021-12-07 NOTE — Progress Notes (Signed)
Chief Complaint  Patient presents with   Hand Pain    Still same had golf injury approx August hook of hamate fracture / states pain persists     Encounter Diagnosis  Name Primary?   Closed nondisplaced fracture of hook process of hamate of right wrist with delayed healing, subsequent encounter Yes    Mr. Matranga had what we presumed is a hook of the hamate injury  He comes in today with pain over the A1 pulley of his right hand with no real complaints in the palm near the wrist  We went over this extensively several times  He has tenderness over the A1 pulley he has not had any locking but he does notice stiffness of this ring and small finger overnight  I think he is having more tenosynovitis of the flexor tendon with a nontender exam to palpation of the hamate  Recommend injection of cortisone  Trigger finger injection  Diagnosis tenosynovitis right ring finger Procedure injection A1 pulley Medications lidocaine 1% 1 mL and Depo-Medrol 40 mg 1 mL Skin prep alcohol and ethyl chloride Verbal consent was obtained Timeout confirmed the injection site  After cleaning the skin with alcohol and anesthetizing the skin with ethyl chloride the A1 pulley was palpated and the injection was performed without complication   Marinated for 4 weeks and then come back for recheck

## 2021-12-10 DIAGNOSIS — H903 Sensorineural hearing loss, bilateral: Secondary | ICD-10-CM | POA: Diagnosis not present

## 2021-12-10 DIAGNOSIS — H60332 Swimmer's ear, left ear: Secondary | ICD-10-CM | POA: Diagnosis not present

## 2022-01-06 ENCOUNTER — Ambulatory Visit: Payer: Medicare PPO | Admitting: Orthopedic Surgery

## 2022-02-04 ENCOUNTER — Other Ambulatory Visit: Payer: Medicare PPO

## 2022-02-04 ENCOUNTER — Other Ambulatory Visit: Payer: Self-pay

## 2022-02-04 DIAGNOSIS — N138 Other obstructive and reflux uropathy: Secondary | ICD-10-CM | POA: Diagnosis not present

## 2022-02-04 DIAGNOSIS — N401 Enlarged prostate with lower urinary tract symptoms: Secondary | ICD-10-CM | POA: Diagnosis not present

## 2022-02-05 LAB — PSA, TOTAL AND FREE
PSA, Free Pct: 20 %
PSA, Free: 0.24 ng/mL
Prostate Specific Ag, Serum: 1.2 ng/mL (ref 0.0–4.0)

## 2022-02-11 ENCOUNTER — Other Ambulatory Visit: Payer: Self-pay

## 2022-02-11 ENCOUNTER — Ambulatory Visit: Payer: Medicare PPO | Admitting: Urology

## 2022-02-11 ENCOUNTER — Encounter: Payer: Self-pay | Admitting: Urology

## 2022-02-11 VITALS — BP 142/73 | HR 64 | Ht 69.0 in | Wt 224.0 lb

## 2022-02-11 DIAGNOSIS — R351 Nocturia: Secondary | ICD-10-CM | POA: Diagnosis not present

## 2022-02-11 DIAGNOSIS — N138 Other obstructive and reflux uropathy: Secondary | ICD-10-CM

## 2022-02-11 DIAGNOSIS — N401 Enlarged prostate with lower urinary tract symptoms: Secondary | ICD-10-CM

## 2022-02-11 DIAGNOSIS — Z87442 Personal history of urinary calculi: Secondary | ICD-10-CM

## 2022-02-11 LAB — URINALYSIS, ROUTINE W REFLEX MICROSCOPIC
Bilirubin, UA: NEGATIVE
Glucose, UA: NEGATIVE
Leukocytes,UA: NEGATIVE
Nitrite, UA: NEGATIVE
RBC, UA: NEGATIVE
Specific Gravity, UA: >1.03 — ABNORMAL HIGH (ref 1.005–1.030)
Urobilinogen, Ur: 1 mg/dL (ref 0.2–1.0)
pH, UA: 5.5 (ref 5.0–7.5)

## 2022-02-11 NOTE — Progress Notes (Signed)
Subjective:  1. Benign prostatic hyperplasia with urinary obstruction   2. Nocturia   3. History of nephrolithiasis      [name] is a 71 year-old male established patient who is here for follow up regarding further evaluation of BPH and lower urinary tract symptoms.  [name] returns in f/u.   He had a TURP on 09/09/16. He had some issues with SUI but that resolved with PT/OT and he is doing well.  He has had no dysuria or hematuria.  He had a recent CT AP that showed a benign left renal cyst and some prostate enlargement.  He has a history of stones but none seen on the recent scan. His UA is clear today.  He has not had a PSA.  He has no associated signs or symptoms.   02/11/22: [name] returns today in f/u for his history of BPH with BOO treated with a TURP in 2017.  He has nocturia x 1.  He  has some increased frequency q3hrs.  He is not sure he empties well but he has a good stream.  His IPSS is 6.   His PSA remains low at 1.2.   He has had no further SUI.  He has had no hematuria or flank pain.  UA is clear today.     IPSS     Row Name 02/11/22 1300         International Prostate Symptom Score   How often have you had the sensation of not emptying your bladder? Less than half the time     How often have you had to urinate less than every two hours? About half the time     How often have you found you stopped and started again several times when you urinated? Not at All     How often have you found it difficult to postpone urination? Not at All     How often have you had a weak urinary stream? Not at All     How often have you had to strain to start urination? Not at All     How many times did you typically get up at night to urinate? 1 Time     Total IPSS Score 6       Quality of Life due to urinary symptoms   If you were to spend the rest of your life with your urinary condition just the way it is now how would you feel about that? Mostly Satisfied                ROS:  ROS:  A complete review of systems was performed.  All systems are negative except for pertinent findings as noted.   Review of Systems  Genitourinary:  Positive for frequency.  Musculoskeletal:  Positive for back pain and joint pain.  Neurological:  Positive for dizziness.  Psychiatric/Behavioral:  Positive for depression. The patient is nervous/anxious.   All other systems reviewed and are negative.  Allergies  Allergen Reactions   Ambien [Zolpidem Tartrate]     Sleep walks   Bactrim [Sulfamethoxazole-Trimethoprim] Hives and Swelling   Dilaudid [Hydromorphone Hcl] Other (See Comments)    Made blood pressure drop with colon resection surgery in 2007    Morphine And Related Nausea And Vomiting    Outpatient Encounter Medications as of 02/11/2022  Medication Sig   ALPRAZolam (XANAX) 0.5 MG tablet Take 1 tablet (0.5 mg total) by mouth 2 (two) times daily as needed. for anxiety  aspirin EC 81 MG tablet Take 81 mg by mouth daily. Swallow whole.   atorvastatin (LIPITOR) 10 MG tablet Take 10 mg by mouth daily.   brimonidine (ALPHAGAN) 0.2 % ophthalmic solution Place 1 drop into both eyes 2 (two) times daily.    buPROPion (WELLBUTRIN XL) 150 MG 24 hr tablet Take 3 tablets (450 mg total) by mouth daily.   dorzolamide-timolol (COSOPT) 22.3-6.8 MG/ML ophthalmic solution Place 1 drop into both eyes 2 (two) times daily.   fluticasone (FLONASE) 50 MCG/ACT nasal spray Place 2 sprays into both nostrils daily as needed for allergies.    latanoprost (XALATAN) 0.005 % ophthalmic solution Place 1 drop into both eyes at bedtime.   pantoprazole (PROTONIX) 40 MG tablet Take 1 tablet (40 mg total) by mouth 2 (two) times daily before a meal.   timolol (TIMOPTIC) 0.5 % ophthalmic solution 1 drop Two (2) times a day.   traZODone (DESYREL) 150 MG tablet Take 1 tablet (150 mg total) by mouth at bedtime.   vitamin B-12 (CYANOCOBALAMIN) 500 MCG tablet Take 500 mcg every evening by mouth.   No  facility-administered encounter medications on file as of 02/11/2022.    Past Medical History:  Diagnosis Date   Anemia    hx of    Arthritis    fingers    BPH (benign prostatic hyperplasia)    Colon cancer ([facility])    stage II, diagnosed 5400   Complication of anesthesia    vagal response after umbilcal hernia at [facility]   Depression    Diabetes mellitus    no as of 09/08/16 due to weight loss    GERD (gastroesophageal reflux disease)    Glaucoma 2012   H. pylori infection 10/2015   Documented eradication March 2017   History of kidney stones    Sleep apnea    wears CiPaP at night   Staph infection 2008   left side of face    Past Surgical History:  Procedure Laterality Date   AGILE CAPSULE N/A 10/29/2019   Procedure: AGILE CAPSULE;  Surgeon: Daneil Dolin, MD;  Location: [facility];  Service: Endoscopy;  Laterality: N/A;  7:30am   APPENDECTOMY     BIOPSY  10/11/2019   Procedure: BIOPSY;  Surgeon: Daneil Dolin, MD;  Location: [facility];  Service: Endoscopy;;  colon   CARDIAC CATHETERIZATION     COLECTOMY  2007   right hemicolectomy   COLONOSCOPY  05/11/10   normal rectum,normal residual colon with some angry appearring anastomtic mucosa with some erosions and oozing, bx unremarkable   COLONOSCOPY  05/16/2012   Dr. Gala Romney- normal rectum, sigmoid diverticulosis, anastomotic ulcers. bx= acute ulcer and benign colonic mucosa with intramucosal lympoid aggregates   COLONOSCOPY N/A 11/17/2016   Dr. Gala Romney: Ulcerated mucosa were present at the anastomosis. benign bx. next TCS in 10/2021.   COLONOSCOPY WITH PROPOFOL N/A 10/11/2019   Dr. Gala Romney: Diverticulosis in the sigmoid colon and descending colon.  Focal ulcerations distal neoterminal ileum uncertain significance.  Biopsies were benign with no evidence of malignancy.  Givens capsule completed after colonoscopy.  5-year surveillance colonoscopy advised.   ESOPHAGEAL DILATION N/A 10/28/2015   Procedure: ESOPHAGEAL DILATION;   Surgeon: Daneil Dolin, MD;  Location: [facility];  Service: Endoscopy;  Laterality: N/A;   ESOPHAGOGASTRODUODENOSCOPY N/A 10/28/2015   RMR: ulcerated proximal esophageal mass ad described status post biopsy. Abnormal distal esophagus biopsy. status post biopsy. Hiatal hernia . abnormal gastric mucosa doubt clinical significance status post biopsy  ESOPHAGOGASTRODUODENOSCOPY (EGD) WITH PROPOFOL N/A 11/07/2017   Dr. Gala Romney: Normal study.     EUS N/A 11/20/2015   Procedure: UPPER ENDOSCOPIC ULTRASOUND (EUS) LINEAR;  Surgeon: Milus Banister, MD;  Location: [facility];  Service: Endoscopy;  Laterality: N/A;   GIVENS CAPSULE STUDY N/A 11/12/2019   Scattered erosions throughout the small bowel.  Ulceration again seen at the anastomosis.  No active bleeding.  Question ischemia   HEMORRHOID SURGERY  12/31/2011   Procedure: HEMORRHOIDECTOMY;  Surgeon: Jamesetta So;  Location: [facility];  Service: General;  Laterality: N/A;   HERNIA REPAIR     umbilical   KIDNEY STONE SURGERY     PILONIDAL CYST / SINUS EXCISION  1976   SKIN LESION EXCISION     left shoulder/pre cancerous   TRANSURETHRAL RESECTION OF PROSTATE N/A 09/09/2016   Procedure: TRANSURETHRAL RESECTION OF THE PROSTATE (TURP);  Surgeon: Irine Seal, MD;  Location: [facility];  Service: Urology;  Laterality: N/A;    Social History   Socioeconomic History   Marital status: Married    Spouse name: Not on file   Number of children: 1   Years of education: Not on file   Highest education level: Not on file  Occupational History   Occupation: school system    Employer: [employer]  Tobacco Use   Smoking status: Never   Smokeless tobacco: Never   Tobacco comments:    Never smoked  Vaping Use   Vaping Use: Never used  Substance and Sexual Activity   Alcohol use: No    Alcohol/week: 0.0 standard drinks   Drug use: No   Sexual activity: Yes    Birth control/protection: None  Other Topics Concern   Not on file  Social History  Narrative   Not on file   Social Determinants of Health   Financial Resource Strain: Not on file  Food Insecurity: Not on file  Transportation Needs: Not on file  Physical Activity: Not on file  Stress: Not on file  Social Connections: Not on file  Intimate Partner Violence: Not on file    Family History  Problem Relation Age of Onset   Lung cancer Father    Diabetes Mother    Anesthesia problems Neg Hx    Hypotension Neg Hx    Malignant hyperthermia Neg Hx    Pseudochol deficiency Neg Hx    Colon cancer Neg Hx        Objective: Vitals:   02/11/22 1335  BP: (!) 142/73  Pulse: 64     Physical Exam Vitals reviewed.  Constitutional:      Appearance: Normal appearance.  Neurological:     Mental Status: He is alert.    Lab Results:  PSA No results found for: PSA No results found for: TESTOSTERONE Results for orders placed or performed in visit on 02/11/22 (from the past 24 hour(s))  Urinalysis, Routine w reflex microscopic     Status: Abnormal   Collection Time: 02/11/22  1:59 PM  Result Value Ref Range   Specific Gravity, UA >1.030 (H) 1.005 - 1.030   pH, UA 5.5 5.0 - 7.5   Color, UA Amber (A) Yellow   Appearance Ur Clear Clear   Leukocytes,UA Negative Negative   Protein,UA Trace (A) Negative/Trace   Glucose, UA Negative Negative   Ketones, UA Trace (A) Negative   RBC, UA Negative Negative   Bilirubin, UA Negative Negative   Urobilinogen, Ur 1.0 0.2 - 1.0 mg/dL   Nitrite, UA Negative  Negative   Microscopic Examination Comment    Narrative   Performed at:  [facility] [address] [place], [place]  [id number] Lab Director: Coats, Phone:  [phone]    UA is clear.  Lab Results  Component Value Date   PSA1 1.2 02/04/2022   PSA1 1.4 02/12/2021     Studies/Results:  Assessment & Plan: BPH with BOO doing well s/p TURP with minimal LUTS.   His PSA remains low and he will return in a PSA in a year.  History of  stones.  He has had no symptoms of concern.  No orders of the defined types were placed in this encounter.    Orders Placed This Encounter  Procedures   Urinalysis, Routine w reflex microscopic   PSA, total and free    Standing Status:   Future    Standing Expiration Date:   02/11/2023      Return in about 1 year (around 02/11/2023) for with PSA.   CC: Nolon Rod 02/11/2022

## 2022-02-16 ENCOUNTER — Encounter: Payer: Self-pay | Admitting: Psychiatry

## 2022-02-16 ENCOUNTER — Ambulatory Visit: Payer: Medicare PPO | Admitting: Psychiatry

## 2022-02-16 ENCOUNTER — Other Ambulatory Visit: Payer: Self-pay

## 2022-02-16 DIAGNOSIS — F411 Generalized anxiety disorder: Secondary | ICD-10-CM

## 2022-02-16 DIAGNOSIS — F902 Attention-deficit hyperactivity disorder, combined type: Secondary | ICD-10-CM

## 2022-02-16 DIAGNOSIS — F39 Unspecified mood [affective] disorder: Secondary | ICD-10-CM

## 2022-02-16 DIAGNOSIS — F331 Major depressive disorder, recurrent, moderate: Secondary | ICD-10-CM

## 2022-02-16 DIAGNOSIS — F341 Dysthymic disorder: Secondary | ICD-10-CM

## 2022-02-16 DIAGNOSIS — Z630 Problems in relationship with spouse or partner: Secondary | ICD-10-CM

## 2022-02-16 DIAGNOSIS — F5105 Insomnia due to other mental disorder: Secondary | ICD-10-CM

## 2022-02-16 NOTE — Patient Instructions (Signed)
Reduce Wellbutrin to 1 each AM and add Auvelity  1 in the AM for 5 days then 1 twice daily with the 1 Wellbutrin

## 2022-02-16 NOTE — Progress Notes (Signed)
[name] [id number] 1951/05/19 71 y.o.     Subjective:   Patient ID:  [name] is a 71 y.o. (DOB 24-Nov-1951) male.  Chief Complaint:  Chief Complaint  Patient presents with   Follow-up    Episodic mood disorder ([place])   Depression   Stress    Depression        Associated symptoms include no decreased concentration and no suicidal ideas. [name] presents for follow-up of depression and medication changes.  At visit February 12, 2019.  He had become more depressed off of the [name] and it was recommended he restart [name] 1.5 mg daily.  He also had previously felt edgy and so we reduced the Wellbutrin from 450 to 300 mg daily in hopes of cutting down on that edgy feeling. He felt edgier after reducing the Wellbutrin to 300 so increased it back.    visit May 2020.  [name] was increased to 1.5 mg daily to try to help more consistently with depression and anxiety.   visit October 24, 2019.  [name] was stopped because it had no beneficial effect.  Adderall was added.   visit February 06, 2020 the following was noted: My highs are higher and lows are lower. Started noticing this more since January.   At times more irritable and needs Xanax.  Off Adderall for 2 mos DT NR.  Last 6 weeks had [name] more alone without wife.  Has a good time with [name].  Gets frustrated that wife complains a lot over what he does.  Don't lose my cool anywhere except at home.  Distracted big time.  Mind wanders.  Nothing is changed at home.  Has talked with wife about it.  They are both their for [name] only.  Unhappy with marriage.  I hate to go out of the world like this.   Nothing I can do about it. Lost 50# and feels good about it.  Has walked a lot and cut back eating.  Step dad died and helps with mother. More irritable lately ? Related to stopping the [name]. Call back if any symptoms are worse. In February 2021 the following was started: Zoloft trial for irritability and anxiety.   25 mg for 7 day then 50 mg daily.  As of 04/01/20 the following noted: Wife irritable and not feeling well and hasn't noticed any effect of his starting Zoloft.  She gets mad over [name]'s care by pt. I've seen a little difference in calmer in situations at home with Zoloft 50.  No SE.  1 cup coffee and a drink a day.  Trazodone still helps sleep.    06/13/20 appt with the following noted: Adderall helping productivity and focus.  Wife notices Out of sertraline.  Couldn't tell a lot of difference with the Zoloft but not sure. No SE Adderall. I know I'm bipolar bc can get on a high and drop to a low.  Can be hyperverbal, hyper energetic.  Wife says he's stuck in the past. Wishes he could get rid of depression.  Can enjoy some things. Plan: DC sertraline bc minimal effect. Latuda 20 mg suggested. Take Latuda with at least 350 calories. Take 20 mg daily for 4 weeks. If no effect then increase to 40 mg daily.  08/18/20 appt with the following noted: No change with Latuda 20 that was any better and stopped it.  Not sure he took 40 mg daily. Seems like he's busier than he's been but also some free  time.  Taking care of [name].  Cuts grass for 2 days.  Went to [name] to visit someone this weekend. Stress wife afraid constantly of Covid and he has to wear mask in his own house. Sleep better with trazodone. Plan: Adderall trial 15 BID  01/12/2021 appointment noted: Adderall not helpful and stopped. Past being concerned about [name]'s comments about his focus.  Getting older. Stayed on Wellbutrin 450, lamotrigine 200, trazodone 100 HS.   No SE. Stress aunt driving him crazy but she's good to him.  Handled Xmas OK. Still wonders at times about bipolar bc irritability. Pt reports that mood is Anxious, Depressed, Dysphoric and Irritable but still trying to figure it out. Not a lot to do.  No friends to do things with.  Routine of a loner.  Describes anxiety as Moderate. Anxiety symptoms include: Excessive  Worry,. Pt reports no sleep issues. Pt reports that appetite is good. Pt reports that energy is lethargic and down slightly and loss of interest or pleasure in usual activities. Concentration is difficulty with focus and attention. Suicidal thoughts:  denied by patient.  Brain doesn't shut off.  .  Hard to push himself into hobbies too.  Unselttl;ed. Bored in retirement and lonely at home.  W preoccupied and consumed with developmentally disabled son [name] and ignores pt.  Even harder time keeping [name] and managing his emotions about it.  Some rumination and guilt on his thoughts. Plan: Reduce lamotrigine to 1 and 1/2 of the 100 mg tablets for 4 weeks, Then reduce lamotrigine to 1 of the tablets for 4 weeks,  Then reduce lamotrigine to 1/2 tablets for 2 weeks,  Then stop lamotrigine  05/14/21 appt noted: Dep OK but nothing to do. Can't stand [name] anymore, more irritating.   Not sleeping good with trazodone.  Trouble going to sleep.  To sleep 1230 and up 830.  Just got new CPAP and it helps. To sleep 2 am. Also on Wellbutrin xl 450 mg AM. NO SE Weaned off lamotrigine and no differences noticed. He feels the # eipisodes with W have lessened in frequency and intensity. Plan: Increase trazodone to 150 mg HS for sleep and might help mood.  07/24/21 appt noted: Took care of aunt for couple years and now caring for mother and son more.  Avoids wife as much as they can.  Gets frustrated with situation of mother and W is critical of him all the time.  Back against the wall.  Yesterday wife said I know you hate me bc I want be intimate with me.  No sex in 70 years.   Recognizes his anger at God over losses and burdens and his son's disabillity.   Emotionally reactive esp with wife who triggers anger.  Chronic tension with wife.  She gets upset with minor things at him. Plan: Wants trial of olanzapine 10 mg pm for mood stability  09/17/21 appt noted: Olanzapine sedated and took 3 weeks.  [name]  noticed him sedated. Doing as good as I can do.  Residual irritability.  Rare Xanax but likes it.  I wish life could be different but it's not.  Playing golf more.  Need to lose weight. Wants to continue other meds. Plan: Returned to trazodone 150 mg HS  02/16/22 appt noted: Continues Wellbutrin 450 and trazodone 150 mg HS. Xanax used about 0.5 mg daily. Recognizes things are not going to get better at home.  Still plays golf on occasion.  Enjoyed that when he did it.  Chronic stress at home.  Takes a trip in April.   Not sure he's clinically depressed but doesn't like what's going on at home.  She is constantly critical of him.   Sleeps with trazodone. No SE. Reads and watches TV and tries to isolate from her to keep the peace.   Past Psychiatric Medication Trials: [name] 1.5, Abilify side effects in 2012, risperidone, [name] no response, Latuda 20 for 3 weeks NR, olanzapine 10 SE sed lithium, lamotrigine NR,  paroxetine, venlafaxine,  nortriptyline, duloxetine,  Pristiq, amitriptyline,, Ambien cognitive side effects, Lunesta cognitive side effects, trazodone,  Xanax, buspirone Evekeo, Nuvigil, Concerta, Vyvanse, Adderall NR  Review of Systems:  Review of Systems  HENT:  Positive for hearing loss.   Cardiovascular:  Positive for chest pain.  Musculoskeletal:  Positive for back pain.  Neurological:  Negative for dizziness, tremors and weakness.  Psychiatric/Behavioral:  Positive for dysphoric mood. Negative for agitation, behavioral problems, confusion, decreased concentration, hallucinations, self-injury, sleep disturbance and suicidal ideas. The patient is nervous/anxious. The patient is not hyperactive.    Medications: I have reviewed the patient's current medications.  Current Outpatient Medications  Medication Sig Dispense Refill   ALPRAZolam (XANAX) 0.5 MG tablet Take 1 tablet (0.5 mg total) by mouth 2 (two) times daily as needed. for anxiety 60 tablet 5   aspirin EC 81 MG  tablet Take 81 mg by mouth daily. Swallow whole.     atorvastatin (LIPITOR) 10 MG tablet Take 10 mg by mouth daily.     brimonidine (ALPHAGAN) 0.2 % ophthalmic solution Place 1 drop into both eyes 2 (two) times daily.      buPROPion (WELLBUTRIN XL) 150 MG 24 hr tablet Take 3 tablets (450 mg total) by mouth daily. 270 tablet 1   dorzolamide-timolol (COSOPT) 22.3-6.8 MG/ML ophthalmic solution Place 1 drop into both eyes 2 (two) times daily.     fluticasone (FLONASE) 50 MCG/ACT nasal spray Place 2 sprays into both nostrils daily as needed for allergies.   2   latanoprost (XALATAN) 0.005 % ophthalmic solution Place 1 drop into both eyes at bedtime.     pantoprazole (PROTONIX) 40 MG tablet Take 1 tablet (40 mg total) by mouth 2 (two) times daily before a meal. 60 tablet 5   timolol (TIMOPTIC) 0.5 % ophthalmic solution 1 drop Two (2) times a day.     traZODone (DESYREL) 150 MG tablet Take 1 tablet (150 mg total) by mouth at bedtime. 90 tablet 3   vitamin B-12 (CYANOCOBALAMIN) 500 MCG tablet Take 500 mcg every evening by mouth. (Patient not taking: Reported on 02/16/2022)     No current facility-administered medications for this visit.    Medication Side Effects: None  Allergies:  Allergies  Allergen Reactions   Ambien [Zolpidem Tartrate]     Sleep walks   Bactrim [Sulfamethoxazole-Trimethoprim] Hives and Swelling   Dilaudid [Hydromorphone Hcl] Other (See Comments)    Made blood pressure drop with colon resection surgery in 2007    Morphine And Related Nausea And Vomiting    Past Medical History:  Diagnosis Date   Anemia    hx of    Arthritis    fingers    BPH (benign prostatic hyperplasia)    Colon cancer ([place])    stage II, diagnosed 7322   Complication of anesthesia    vagal response after umbilcal hernia at [facility]   Depression    Diabetes mellitus    no as of 09/08/16 due to weight loss  GERD (gastroesophageal reflux disease)    Glaucoma 2012   H. pylori infection 10/2015    Documented eradication March 2017   History of kidney stones    Sleep apnea    wears CiPaP at night   Staph infection 2008   left side of face    Family History  Problem Relation Age of Onset   Lung cancer Father    Diabetes Mother    Anesthesia problems Neg Hx    Hypotension Neg Hx    Malignant hyperthermia Neg Hx    Pseudochol deficiency Neg Hx    Colon cancer Neg Hx     Social History   Socioeconomic History   Marital status: Married    Spouse name: Not on file   Number of children: 1   Years of education: Not on file   Highest education level: Not on file  Occupational History   Occupation: school system    Employer: [employer]  Tobacco Use   Smoking status: Never   Smokeless tobacco: Never   Tobacco comments:    Never smoked  Vaping Use   Vaping Use: Never used  Substance and Sexual Activity   Alcohol use: No    Alcohol/week: 0.0 standard drinks   Drug use: No   Sexual activity: Yes    Birth control/protection: None  Other Topics Concern   Not on file  Social History Narrative   Not on file   Social Determinants of Health   Financial Resource Strain: Not on file  Food Insecurity: Not on file  Transportation Needs: Not on file  Physical Activity: Not on file  Stress: Not on file  Social Connections: Not on file  Intimate Partner Violence: Not on file    Past Medical History, Surgical history, Social history, and Family history were reviewed and updated as appropriate.   Please see review of systems for further details on the patient's review from today.   Objective:   Physical Exam:  There were no vitals taken for this visit.  Physical Exam Constitutional:      General: He is not in acute distress.    Appearance: He is well-developed.  Musculoskeletal:        General: No deformity.  Neurological:     Mental Status: He is alert and oriented to person, place, and time.     Cranial Nerves: No dysarthria.     Coordination:  Coordination normal.  Psychiatric:        Attention and Perception: Perception normal. He is inattentive. He does not perceive auditory or visual hallucinations.        Mood and Affect: Mood is depressed. Mood is not anxious. Affect is not labile, blunt, angry, tearful or inappropriate.        Speech: Speech normal. Speech is not rapid and pressured.        Behavior: Behavior normal. Behavior is cooperative.        Thought Content: Thought content normal. Thought content is not paranoid or delusional. Thought content does not include homicidal or suicidal ideation. Thought content does not include suicidal plan.        Cognition and Memory: Cognition and memory normal.        Judgment: Judgment normal.     Comments: Chronic dysthymia is unchanged.  He is not particularly anxious this time. Irritability is present and chronic.    Lab Review:     Component Value Date/Time   NA 136 11/03/2021 1542   NA  141 12/30/2020 1137   K 4.4 11/03/2021 1542   CL 103 11/03/2021 1542   CO2 26 11/03/2021 1542   GLUCOSE 106 (H) 11/03/2021 1542   BUN 16 11/03/2021 1542   BUN 19 12/30/2020 1137   CREATININE 1.08 11/03/2021 1542   CREATININE 0.90 12/11/2019 0832   CALCIUM 8.9 11/03/2021 1542   PROT 6.4 (L) 08/13/2019 1143   ALBUMIN 3.7 08/13/2019 1143   AST 17 08/13/2019 1143   ALT 13 08/13/2019 1143   ALKPHOS 79 08/13/2019 1143   BILITOT 0.7 08/13/2019 1143   GFRNONAA >60 11/03/2021 1542   GFRAA 86 12/30/2020 1137       Component Value Date/Time   WBC 5.1 12/30/2020 1137   WBC 4.6 08/13/2019 1143   RBC 5.23 12/30/2020 1137   RBC 4.76 08/13/2019 1143   HGB 16.0 12/30/2020 1137   HCT 46.9 12/30/2020 1137   PLT 158 12/30/2020 1137   MCV 90 12/30/2020 1137   MCH 30.6 12/30/2020 1137   MCH 29.6 08/13/2019 1143   MCHC 34.1 12/30/2020 1137   MCHC 33.1 08/13/2019 1143   RDW 13.3 12/30/2020 1137   LYMPHSABS 1.2 12/30/2020 1137   MONOABS 0.9 07/31/2019 0235   EOSABS 0.1 12/30/2020 1137    BASOSABS 0.0 12/30/2020 1137    No results found for: POCLITH, LITHIUM   No results found for: PHENYTOIN, PHENOBARB, VALPROATE, CBMZ   Normal stress test.  No exercise.  Won't stick with diet. .res Assessment: Plan:    Episodic mood disorder (HCC)  Major depressive disorder, recurrent episode, moderate (HCC)  Dysthymia  Generalized anxiety disorder  Attention deficit hyperactivity disorder (ADHD), combined type  Marital dysfunction  Insomnia due to mental condition   Greater than 50% of 30 min face to face time with patient was spent on counseling and coordination of care. 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  He wonders if he has bipolar disorder.  He has failed multiple medications listed above.   Supportive therapy on problems with home life.  Trying to motivate for the hobbies.  Disc anger turned inwards can be a source of depression.  Admits anger at God. 30 min discussion of anger and depression and spirituality.  Caring for mother with brother.  Disc conflict with wife at home chronically.  Xanax 0.5 mg bid prn anxiety. Using rarely for sleep and anxiety.  It is worth noting that he was worse with irritability and edginess after reducing the Wellbutrin to 300 mg daily and better at 450 mg daily so we will continue Wellbutrin 450 mg daily. Consider Auvelity.  Disc SE and potential expense.  Change in meds will not change home life which is the primary problem.   Yes bc easy to do with low risk: Reduce Wellbutrin to 1 each AM and add Auvelity  1 in the AM for 5 days,  then 1 twice daily with the 1 Wellbutrin  Chronic attention problems remain and he is failed multiple ADD meds including off label things such as modafinil and Nuvigil .  His wife complains of his ADD symptoms as well.     Encourage more golf as much as possible..    Returned to trazodone 150 mg HS No indication for changes in meds per his  request  This appt was 30 mins.  FU 2 mos  Lynder Parents, MD, DFAPA   Please see After Visit Summary for patient specific instructions.  Future Appointments  Date Time Provider Department  Center  02/23/2022  2:00 PM Werner Lean, MD [facility]  02/03/2023 11:00 AM [facility] None  02/10/2023  1:30 PM Irine Seal, MD [facility] None    No orders of the defined types were placed in this encounter.      -------------------------------

## 2022-02-22 NOTE — Progress Notes (Unsigned)
Cardiology Office Note:    Date:  02/22/2022   ID:  [name], DOB 1951/10/22, MRN [id number]  PCP:  Jalene Mullet, PA-C   [facility] Cardiologist:  Werner Lean, MD     Referring MD: Rory Percy, MD   CC: chest pain f/u  History of Present Illness:    [name] is a 71 y.o. male with a hx of OSA on CPAP, T2DM, Aortic atherosclerosis prior history of colon cancer who presents for evaluation 10/22/21. In interval, had CCTA with minimal non obstructive CAD.  Patient notes that he is doing ***.   Since day prior/last visit notes *** . There are no*** interval hospital/ED visit.    No chest pain or pressure ***.  No SOB/DOE*** and no PND/Orthopnea***.  No weight gain or leg swelling***.  No palpitations or syncope ***.  Ambulatory blood pressure ***.   Past Medical History:  Diagnosis Date   Anemia    hx of    Arthritis    fingers    BPH (benign prostatic hyperplasia)    Colon cancer ([place])    stage II, diagnosed 3474   Complication of anesthesia    vagal response after umbilcal hernia at [facility]   Depression    Diabetes mellitus    no as of 09/08/16 due to weight loss    GERD (gastroesophageal reflux disease)    Glaucoma 2012   H. pylori infection 10/2015   Documented eradication March 2017   History of kidney stones    Sleep apnea    wears CiPaP at night   Staph infection 2008   left side of face    Past Surgical History:  Procedure Laterality Date   AGILE CAPSULE N/A 10/29/2019   Procedure: AGILE CAPSULE;  Surgeon: Daneil Dolin, MD;  [facility];  Service: Endoscopy;  Laterality: N/A;  7:30am   APPENDECTOMY     BIOPSY  10/11/2019   Procedure: BIOPSY;  Surgeon: Daneil Dolin, MD;  Location: [facility];  Service: Endoscopy;;  colon   CARDIAC CATHETERIZATION     COLECTOMY  2007   right hemicolectomy   COLONOSCOPY  05/11/10   normal rectum,normal residual colon with some angry appearring anastomtic mucosa with  some erosions and oozing, bx unremarkable   COLONOSCOPY  05/16/2012   Dr. Gala Romney- normal rectum, sigmoid diverticulosis, anastomotic ulcers. bx= acute ulcer and benign colonic mucosa with intramucosal lympoid aggregates   COLONOSCOPY N/A 11/17/2016   Dr. Gala Romney: Ulcerated mucosa were present at the anastomosis. benign bx. next TCS in 10/2021.   COLONOSCOPY WITH PROPOFOL N/A 10/11/2019   Dr. Gala Romney: Diverticulosis in the sigmoid colon and descending colon.  Focal ulcerations distal neoterminal ileum uncertain significance.  Biopsies were benign with no evidence of malignancy.  Givens capsule completed after colonoscopy.  5-year surveillance colonoscopy advised.   ESOPHAGEAL DILATION N/A 10/28/2015   Procedure: ESOPHAGEAL DILATION;  Surgeon: Daneil Dolin, MD;  [facility];  Service: Endoscopy;  Laterality: N/A;   ESOPHAGOGASTRODUODENOSCOPY N/A 10/28/2015   RMR: ulcerated proximal esophageal mass ad described status post biopsy. Abnormal distal esophagus biopsy. status post biopsy. Hiatal hernia . abnormal gastric mucosa doubt clinical significance status post biopsy   ESOPHAGOGASTRODUODENOSCOPY (EGD) WITH PROPOFOL N/A 11/07/2017   Dr. Gala Romney: Normal study.     EUS N/A 11/20/2015   Procedure: UPPER ENDOSCOPIC ULTRASOUND (EUS) LINEAR;  Surgeon: Milus Banister, MD;  Location: [facility];  Service: Endoscopy;  Laterality: N/A;  GIVENS CAPSULE STUDY N/A 11/12/2019   Scattered erosions throughout the small bowel.  Ulceration again seen at the anastomosis.  No active bleeding.  Question ischemia   HEMORRHOID SURGERY  12/31/2011   Procedure: HEMORRHOIDECTOMY;  Surgeon: Jamesetta So;  Location: [facility];  Service: General;  Laterality: N/A;   HERNIA REPAIR     umbilical   KIDNEY STONE SURGERY     PILONIDAL CYST / SINUS EXCISION  1976   SKIN LESION EXCISION     left shoulder/pre cancerous   TRANSURETHRAL RESECTION OF PROSTATE N/A 09/09/2016   Procedure: TRANSURETHRAL RESECTION OF THE PROSTATE  (TURP);  Surgeon: Irine Seal, MD;  Location: [facility];  Service: Urology;  Laterality: N/A;    Current Medications: No outpatient medications have been marked as taking for the 02/23/22 encounter (Appointment) with Werner Lean, MD.     Allergies:   Ambien [zolpidem tartrate], Bactrim [sulfamethoxazole-trimethoprim], Dilaudid [hydromorphone hcl], and Morphine and related   Social History   Socioeconomic History   Marital status: Married    Spouse name: Not on file   Number of children: 1   Years of education: Not on file   Highest education level: Not on file  Occupational History   Occupation: school system    Employer: [employer]  Tobacco Use   Smoking status: Never   Smokeless tobacco: Never   Tobacco comments:    Never smoked  Vaping Use   Vaping Use: Never used  Substance and Sexual Activity   Alcohol use: No    Alcohol/week: 0.0 standard drinks   Drug use: No   Sexual activity: Yes    Birth control/protection: None  Other Topics Concern   Not on file  Social History Narrative   Not on file   Social Determinants of Health   Financial Resource Strain: Not on file  Food Insecurity: Not on file  Transportation Needs: Not on file  Physical Activity: Not on file  Stress: Not on file  Social Connections: Not on file    Social: taught school for 42 years; coached basketball, plays golf; son has cerebal palsy and is at home  Family History: The patient's family history includes Diabetes in his mother; Lung cancer in his father. There is no history of Anesthesia problems, Hypotension, Malignant hyperthermia, Pseudochol deficiency, or Colon cancer. Grandmother and grandfathers had MI.  ROS:   Please see the history of present illness.     All other systems reviewed and are negative.  EKGs/Labs/Other Studies Reviewed:    The following studies were reviewed today:  EKG:   10/22/21: Sinus bradycardia 1st HB with lead reversal  Transthoracic  Echocardiogram: Date: 12/14/2012 Results: Study Conclusions   - Left ventricle: The cavity size was normal. Wall thickness    was increased in a pattern of mild LVH. Systolic function    was vigorous. The estimated ejection fraction was in the    range of 65% to 70%. Wall motion was normal; there were no    regional wall motion abnormalities. Doppler parameters are    consistent with abnormal left ventricular relaxation    (grade 1 diastolic dysfunction).  - Mitral valve: Trivial regurgitation.  - Tricuspid valve: Trivial regurgitation.  - Pericardium, extracardiac: There was no pericardial    effusion.   PET- non cardiac  Date:01/01/2015 Results: Aortic Atherosclerosis and LM and LAD CAC  Per OSH Records Saw Dr. Olevia Bowens in 2019 (cardiology [facility]) Negative Stress test 10/24/2018 LHC 2005 no disease  Recent Labs:  11/03/2021: BUN 16; Creatinine, Ser 1.08; Potassium 4.4; Sodium 136  Recent Lipid Panel No results found for: CHOL, TRIG, HDL, CHOLHDL, VLDL, LDLCALC, LDLDIRECT      Physical Exam:    VS:  There were no vitals taken for this visit.    Wt Readings from Last 3 Encounters:  02/11/22 224 lb (101.6 kg)  10/26/21 220 lb (99.8 kg)  10/22/21 217 lb 12.8 oz (98.8 kg)     Gen: *** distress, *** obese/well nourished/malnourished   Neck: No JVD, *** carotid bruit Ears: *** Frank Sign Cardiac: No Rubs or Gallops, *** Murmur, ***cardia, *** radial pulses Respiratory: Clear to auscultation bilaterally, *** effort, ***  respiratory rate GI: Soft, nontender, non-distended *** MS: No *** edema; *** moves all extremities Integument: Skin feels *** Neuro:  At time of evaluation, alert and oriented to person/place/time/situation *** Psych: Normal affect, patient feels ***   ASSESSMENT:    No diagnosis found.  PLAN:    Non cardiac CP OSA on CPAP Aortic Atherosclerosis and  minimal non obstructive CAD HLD with DM - symptomatic on *** with stable/unstable angina on therapy  *** - anatomy: *** - continue ASA 81 mg; Continue *** until *** - continue statin, goal LDL < 55 - sending Lp(a) *** - no BB due to low resting heart rates - continue nitrates; *** PDEi - continue ACEi - discussed cardiac rehab   Medication Adjustments/Labs and Tests Ordered: Current medicines are reviewed at length with the patient today.  Concerns regarding medicines are outlined above.  No orders of the defined types were placed in this encounter.  No orders of the defined types were placed in this encounter.    There are no Patient Instructions on file for this visit.   Signed, Werner Lean, MD  02/22/2022 8:27 AM    [place]

## 2022-02-23 ENCOUNTER — Ambulatory Visit: Payer: Medicare PPO | Admitting: Internal Medicine

## 2022-03-16 DIAGNOSIS — I10 Essential (primary) hypertension: Secondary | ICD-10-CM | POA: Diagnosis not present

## 2022-03-16 DIAGNOSIS — H6120 Impacted cerumen, unspecified ear: Secondary | ICD-10-CM | POA: Diagnosis not present

## 2022-03-16 DIAGNOSIS — Z6833 Body mass index (BMI) 33.0-33.9, adult: Secondary | ICD-10-CM | POA: Diagnosis not present

## 2022-03-22 DIAGNOSIS — H401111 Primary open-angle glaucoma, right eye, mild stage: Secondary | ICD-10-CM | POA: Diagnosis not present

## 2022-03-22 DIAGNOSIS — H401122 Primary open-angle glaucoma, left eye, moderate stage: Secondary | ICD-10-CM | POA: Diagnosis not present

## 2022-03-22 DIAGNOSIS — H26493 Other secondary cataract, bilateral: Secondary | ICD-10-CM | POA: Diagnosis not present

## 2022-04-01 ENCOUNTER — Other Ambulatory Visit: Payer: Self-pay | Admitting: Psychiatry

## 2022-04-01 DIAGNOSIS — F411 Generalized anxiety disorder: Secondary | ICD-10-CM

## 2022-04-08 DIAGNOSIS — E7849 Other hyperlipidemia: Secondary | ICD-10-CM | POA: Diagnosis not present

## 2022-04-08 DIAGNOSIS — Z13220 Encounter for screening for lipoid disorders: Secondary | ICD-10-CM | POA: Diagnosis not present

## 2022-04-08 DIAGNOSIS — E119 Type 2 diabetes mellitus without complications: Secondary | ICD-10-CM | POA: Diagnosis not present

## 2022-04-08 DIAGNOSIS — R5383 Other fatigue: Secondary | ICD-10-CM | POA: Diagnosis not present

## 2022-04-08 DIAGNOSIS — E782 Mixed hyperlipidemia: Secondary | ICD-10-CM | POA: Diagnosis not present

## 2022-04-08 DIAGNOSIS — E039 Hypothyroidism, unspecified: Secondary | ICD-10-CM | POA: Diagnosis not present

## 2022-04-08 DIAGNOSIS — E538 Deficiency of other specified B group vitamins: Secondary | ICD-10-CM | POA: Diagnosis not present

## 2022-04-08 DIAGNOSIS — I10 Essential (primary) hypertension: Secondary | ICD-10-CM | POA: Diagnosis not present

## 2022-04-09 DIAGNOSIS — H401122 Primary open-angle glaucoma, left eye, moderate stage: Secondary | ICD-10-CM | POA: Diagnosis not present

## 2022-04-09 DIAGNOSIS — H401111 Primary open-angle glaucoma, right eye, mild stage: Secondary | ICD-10-CM | POA: Diagnosis not present

## 2022-04-09 DIAGNOSIS — H26493 Other secondary cataract, bilateral: Secondary | ICD-10-CM | POA: Diagnosis not present

## 2022-04-13 DIAGNOSIS — Z85038 Personal history of other malignant neoplasm of large intestine: Secondary | ICD-10-CM | POA: Diagnosis not present

## 2022-04-13 DIAGNOSIS — N3943 Post-void dribbling: Secondary | ICD-10-CM | POA: Diagnosis not present

## 2022-04-13 DIAGNOSIS — G4733 Obstructive sleep apnea (adult) (pediatric): Secondary | ICD-10-CM | POA: Diagnosis not present

## 2022-04-13 DIAGNOSIS — H4050X0 Glaucoma secondary to other eye disorders, unspecified eye, stage unspecified: Secondary | ICD-10-CM | POA: Diagnosis not present

## 2022-04-13 DIAGNOSIS — Z23 Encounter for immunization: Secondary | ICD-10-CM | POA: Diagnosis not present

## 2022-04-13 DIAGNOSIS — I10 Essential (primary) hypertension: Secondary | ICD-10-CM | POA: Diagnosis not present

## 2022-04-13 DIAGNOSIS — H6122 Impacted cerumen, left ear: Secondary | ICD-10-CM | POA: Diagnosis not present

## 2022-04-13 DIAGNOSIS — E782 Mixed hyperlipidemia: Secondary | ICD-10-CM | POA: Diagnosis not present

## 2022-04-13 DIAGNOSIS — H6121 Impacted cerumen, right ear: Secondary | ICD-10-CM | POA: Diagnosis not present

## 2022-04-13 DIAGNOSIS — F419 Anxiety disorder, unspecified: Secondary | ICD-10-CM | POA: Diagnosis not present

## 2022-04-13 DIAGNOSIS — R7989 Other specified abnormal findings of blood chemistry: Secondary | ICD-10-CM | POA: Diagnosis not present

## 2022-04-13 DIAGNOSIS — E538 Deficiency of other specified B group vitamins: Secondary | ICD-10-CM | POA: Diagnosis not present

## 2022-04-13 DIAGNOSIS — R739 Hyperglycemia, unspecified: Secondary | ICD-10-CM | POA: Diagnosis not present

## 2022-04-21 ENCOUNTER — Encounter: Payer: Self-pay | Admitting: Internal Medicine

## 2022-04-22 ENCOUNTER — Ambulatory Visit (INDEPENDENT_AMBULATORY_CARE_PROVIDER_SITE_OTHER): Payer: Medicare PPO | Admitting: Psychiatry

## 2022-04-22 ENCOUNTER — Encounter: Payer: Self-pay | Admitting: Psychiatry

## 2022-04-22 DIAGNOSIS — F331 Major depressive disorder, recurrent, moderate: Secondary | ICD-10-CM | POA: Diagnosis not present

## 2022-04-22 DIAGNOSIS — F5105 Insomnia due to other mental disorder: Secondary | ICD-10-CM | POA: Diagnosis not present

## 2022-04-22 DIAGNOSIS — F39 Unspecified mood [affective] disorder: Secondary | ICD-10-CM | POA: Diagnosis not present

## 2022-04-22 DIAGNOSIS — F411 Generalized anxiety disorder: Secondary | ICD-10-CM | POA: Diagnosis not present

## 2022-04-22 DIAGNOSIS — Z630 Problems in relationship with spouse or partner: Secondary | ICD-10-CM | POA: Diagnosis not present

## 2022-04-22 DIAGNOSIS — F902 Attention-deficit hyperactivity disorder, combined type: Secondary | ICD-10-CM

## 2022-04-22 DIAGNOSIS — F341 Dysthymic disorder: Secondary | ICD-10-CM | POA: Diagnosis not present

## 2022-04-22 MED ORDER — AUVELITY 45-105 MG PO TBCR: 1.0000 | EXTENDED_RELEASE_TABLET | Freq: Two times a day (BID) | ORAL | 1 refills | Status: DC

## 2022-04-22 NOTE — Progress Notes (Signed)
XZAVIOR REINIG ?[id number] ?[birth year] ?71 y.o.  ? ?Virtual Visit via Telephone Note ? ?I connected with pt by telephone and verified that I am speaking with the correct person using two identifiers. ?  ?I discussed the limitations, risks, security and privacy concerns of performing an evaluation and management service by telephone and the availability of in person appointments. I also discussed with the patient that there may be a patient responsible charge related to this service. The patient expressed understanding and agreed to proceed. ? ?I discussed the assessment and treatment plan with the patient. The patient was provided an opportunity to ask questions and all were answered. The patient agreed with the plan and demonstrated an understanding of the instructions. ?  ?The patient was advised to call back or seek an in-person evaluation if the symptoms worsen or if the condition fails to improve as anticipated. ? ?I provided 25 minutes of non-face-to-face time during this encounter. The call started at 9:00 and ended at 930. The patient was located at home and the provider was located office. ?Patient was caring for his house bound son. ? ?Subjective:  ? ?Patient ID:  [name] is a 71 y.o. (DOB Apr 06, 1951) male. ? ?Chief Complaint:  ?Chief Complaint  ?Patient presents with  ? Follow-up  ? Episodic mood disorder   ? Major depressive disorder, recurrent episode, moderate   ? Office Visit Attention deficit hyperactivity disorder (ADHD  ? Attention deficit hyperactivity disorder (ADHD), combined t  ? ? ?Depression ?       Associated symptoms include no decreased concentration and no suicidal ideas. ?[name] presents for follow-up of depression and medication changes. ? ?At visit February 12, 2019.  He had become more depressed off of the [name] and it was recommended he restart [name] 1.5 mg daily.  He also had previously felt edgy and so we reduced the Wellbutrin from 450 to 300 mg daily in hopes of  cutting down on that edgy feeling. ?He felt edgier after reducing the Wellbutrin to 300 so increased it back.   ? ?visit May 2020.  [name] was increased to 1.5 mg daily to try to help more consistently with depression and anxiety.  ? ?visit October 24, 2019.  [name] was stopped because it had no beneficial effect.  Adderall was added. ? ? visit February 06, 2020 the following was noted: ?My highs are higher and lows are lower. Started noticing this more since January.   At times more irritable and needs Xanax.  Off Adderall for 2 mos DT NR.  Last 6 weeks had [name] more alone without wife.  Has a good time with [name].  Gets frustrated that wife complains a lot over what he does.  Don't lose my cool anywhere except at home. ? Distracted big time.  Mind wanders.  Nothing is changed at home.  Has talked with wife about it.  They are both their for [name] only.  Unhappy with marriage.  I hate to go out of the world like this.   Nothing I can do about it. ?Lost 50# and feels good about it.  Has walked a lot and cut back eating.  Step dad died and helps with mother. ?More irritable lately ? Related to stopping the [name]. ?Call back if any symptoms are worse. ?In February 2021 the following was started: Zoloft trial for irritability and anxiety.  25 mg for 7 day then 50 mg daily. ? ?As of 04/01/20 the following noted: ?Wife irritable  and not feeling well and hasn't noticed any effect of his starting Zoloft.  She gets mad over [name]'s care by pt. ?I've seen a little difference in calmer in situations at home with Zoloft 50.  No SE. ? ?1 cup coffee and a drink a day.  Trazodone still helps sleep.   ? ?06/13/20 appt with the following noted: ?Adderall helping productivity and focus.  Wife notices ?Out of sertraline.  Couldn't tell a lot of difference with the Zoloft but not sure. ?No SE Adderall. ?I know I'm bipolar bc can get on a high and drop to a low.  Can be hyperverbal, hyper energetic.  Wife says he's stuck in the  past. ?Wishes he could get rid of depression.  Can enjoy some things. ?Plan: DC sertraline bc minimal effect. ?Latuda 20 mg suggested. ?Take Latuda with at least 350 calories. Take 20 mg daily for 4 weeks. ?If no effect then increase to 40 mg daily. ? ?08/18/20 appt with the following noted: ?No change with Latuda 20 that was any better and stopped it.  Not sure he took 40 mg daily. ?Seems like he's busier than he's been but also some free time.  Taking care of Mo.  Cuts grass for 2 days.  Went to [name] to visit someone this weekend. ?Stress wife afraid constantly of Covid and he has to wear mask in his own house. ?Sleep better with trazodone. ?Plan: Adderall trial 15 BID ? ?01/12/2021 appointment noted: ?Adderall not helpful and stopped. ?Past being concerned about [name]'s comments about his focus.  Getting older. ?Stayed on Wellbutrin 450, lamotrigine 200, trazodone 100 HS.   No SE. ?Stress aunt driving him crazy but she's good to him.  Handled Xmas OK. ?Still wonders at times about bipolar bc irritability. ?Pt reports that mood is Anxious, Depressed, Dysphoric and Irritable but still trying to figure it out. Not a lot to do.  No friends to do things with.  Routine of a loner.  Describes anxiety as Moderate. Anxiety symptoms include: Excessive Worry,. Pt reports no sleep issues. Pt reports that appetite is good. Pt reports that energy is lethargic and down slightly and loss of interest or pleasure in usual activities. Concentration is difficulty with focus and attention. Suicidal thoughts:  denied by patient.  Brain doesn't shut off.  .  Hard to push himself into hobbies too.  Unselttl;ed. ?Bored in retirement and lonely at home.  W preoccupied and consumed with developmentally disabled son [name] and ignores pt.  Even harder time keeping [name] and managing his emotions about it.  Some rumination and guilt on his thoughts. ?Plan: Reduce lamotrigine to 1 and 1/2 of the 100 mg tablets for 4 weeks, ?Then reduce  lamotrigine to 1 of the tablets for 4 weeks,  ?Then reduce lamotrigine to 1/2 tablets for 2 weeks,  ?Then stop lamotrigine ? ?05/14/21 appt noted: ?Dep OK but nothing to do. ?[place] anymore, more irritating.   Not sleeping good with trazodone.  Trouble going to sleep.  To sleep 1230 and up 830.  Just got new CPAP and it helps. ?To sleep 2 am. ?Also on Wellbutrin xl 450 mg AM. ?NO SE ?Weaned off lamotrigine and no differences noticed. ?He feels the # eipisodes with W have lessened in frequency and intensity. ?Plan: Increase trazodone to 150 mg HS for sleep and might help mood. ? ?07/24/21 appt noted: ?Took care of aunt for couple years and now caring for mother and son more.  Avoids wife  as much as they can.  Gets frustrated with situation of mother and W is critical of him all the time.  Back against the wall.  Yesterday wife said I know you hate me bc I want be intimate with me.  No sex in 72 years.   ?Recognizes his anger at God over losses and burdens and his son's disabillity.   ?Emotionally reactive esp with wife who triggers anger.  Chronic tension with wife.  She gets upset with minor things at him. ?Plan: Wants trial of olanzapine 10 mg pm for mood stability ? ?09/17/21 appt noted: ?Olanzapine sedated and took 3 weeks.  [name] noticed him sedated. ?Doing as good as I can do.  Residual irritability.  Rare Xanax but likes it.  I wish life could be different but it's not.  Playing golf more.  Need to lose weight. ?Wants to continue other meds. ?Plan: Returned to trazodone 150 mg HS ? ?02/16/22 appt noted: ?Continues Wellbutrin 450 and trazodone 150 mg HS. ?Xanax used about 0.5 mg daily. ?Recognizes things are not going to get better at home.  Still plays golf on occasion.  Enjoyed that when he did it.   ?Chronic stress at home.  Takes a trip in April.   ?Not sure he's clinically depressed but doesn't like what's going on at home.  She is constantly critical of him.   ?Sleeps with trazodone. ?No  SE. ?Reads and watches TV and tries to isolate from her to keep the peace.  ? ?04/22/22 appt noted: ?Less explosive with change to [place] from Wellbutrin per wife.  Took it 6 week-8 weeks. ?No SE ?Ran out and back

## 2022-04-26 ENCOUNTER — Telehealth: Payer: Self-pay

## 2022-04-26 ENCOUNTER — Other Ambulatory Visit: Payer: Self-pay | Admitting: Psychiatry

## 2022-04-26 DIAGNOSIS — F5105 Insomnia due to other mental disorder: Secondary | ICD-10-CM

## 2022-04-26 NOTE — Telephone Encounter (Signed)
Prior Authorization submitted and approved for AUVELITY 45-105 MG effective 12/20/2021-12/19/2022 PA# [id number] with [facility] [id number] ?

## 2022-04-27 ENCOUNTER — Telehealth: Payer: Self-pay | Admitting: Psychiatry

## 2022-04-27 NOTE — Telephone Encounter (Signed)
I called pt to make follow up appt.  He advised that he wanted Dr. Clovis Pu to know the [facility] med was $1200.  I found note from yesterday from [place] that the PA had been approved and advised him. He would the script sent to [facility] on [address] in [place]. ? ? ? ?Next appt 6/5 ?

## 2022-04-27 NOTE — Telephone Encounter (Signed)
Called pharmacy to have them run thru insurance and cost is $100. Notified patient.  ?

## 2022-05-05 DIAGNOSIS — H6123 Impacted cerumen, bilateral: Secondary | ICD-10-CM | POA: Diagnosis not present

## 2022-05-10 DIAGNOSIS — L304 Erythema intertrigo: Secondary | ICD-10-CM | POA: Diagnosis not present

## 2022-05-10 DIAGNOSIS — D225 Melanocytic nevi of trunk: Secondary | ICD-10-CM | POA: Diagnosis not present

## 2022-05-10 DIAGNOSIS — Z1283 Encounter for screening for malignant neoplasm of skin: Secondary | ICD-10-CM | POA: Diagnosis not present

## 2022-05-10 DIAGNOSIS — L218 Other seborrheic dermatitis: Secondary | ICD-10-CM | POA: Diagnosis not present

## 2022-05-24 ENCOUNTER — Encounter: Payer: Self-pay | Admitting: Psychiatry

## 2022-05-24 ENCOUNTER — Ambulatory Visit: Payer: Medicare PPO | Admitting: Psychiatry

## 2022-05-24 DIAGNOSIS — F411 Generalized anxiety disorder: Secondary | ICD-10-CM

## 2022-05-24 DIAGNOSIS — F341 Dysthymic disorder: Secondary | ICD-10-CM

## 2022-05-24 DIAGNOSIS — F331 Major depressive disorder, recurrent, moderate: Secondary | ICD-10-CM

## 2022-05-24 DIAGNOSIS — F5105 Insomnia due to other mental disorder: Secondary | ICD-10-CM | POA: Diagnosis not present

## 2022-05-24 DIAGNOSIS — F902 Attention-deficit hyperactivity disorder, combined type: Secondary | ICD-10-CM | POA: Diagnosis not present

## 2022-05-24 DIAGNOSIS — F39 Unspecified mood [affective] disorder: Secondary | ICD-10-CM

## 2022-05-24 MED ORDER — BUPROPION HCL ER (XL) 150 MG PO TB24
150.0000 mg | ORAL_TABLET | Freq: Every day | ORAL | 0 refills | Status: DC
Start: 2022-05-24 — End: 2022-10-20

## 2022-05-24 MED ORDER — AUVELITY 45-105 MG PO TBCR: 1.0000 | EXTENDED_RELEASE_TABLET | Freq: Two times a day (BID) | ORAL | 2 refills | Status: DC

## 2022-05-24 MED ORDER — TRAZODONE HCL 150 MG PO TABS: 150.0000 mg | ORAL_TABLET | Freq: Every day | ORAL | 0 refills | Status: DC

## 2022-05-24 MED ORDER — ALPRAZOLAM 0.5 MG PO TABS: 0.5000 mg | ORAL_TABLET | Freq: Two times a day (BID) | ORAL | 3 refills | Status: DC | PRN

## 2022-05-24 NOTE — Progress Notes (Signed)
[name] [id number] 1951/07/06 70 y.o.    Subjective:   Patient ID:  [name] is a 71 y.o. (DOB August 19, 1951) male.  Chief Complaint:  Chief Complaint  Patient presents with   Follow-up    Episodic mood disorder ([place])   Depression   Anxiety    Depression        Associated symptoms include no decreased concentration and no suicidal ideas. [name] presents for follow-up of depression and medication changes.  At visit February 12, 2019.  He had become more depressed off of the [name] and it was recommended he restart [name] 1.5 mg daily.  He also had previously felt edgy and so we reduced the Wellbutrin from 450 to 300 mg daily in hopes of cutting down on that edgy feeling. He felt edgier after reducing the Wellbutrin to 300 so increased it back.    visit May 2020.  [name] was increased to 1.5 mg daily to try to help more consistently with depression and anxiety.   visit October 24, 2019.  [name] was stopped because it had no beneficial effect.  Adderall was added.   visit February 06, 2020 the following was noted: My highs are higher and lows are lower. Started noticing this more since January.   At times more irritable and needs Xanax.  Off Adderall for 2 mos DT NR.  Last 6 weeks had [name] more alone without wife.  Has a good time with [name].  Gets frustrated that wife complains a lot over what he does.  Don't lose my cool anywhere except at home.  Distracted big time.  Mind wanders.  Nothing is changed at home.  Has talked with wife about it.  They are both their for [name] only.  Unhappy with marriage.  I hate to go out of the world like this.   Nothing I can do about it. Lost 50# and feels good about it.  Has walked a lot and cut back eating.  Step dad died and helps with mother. More irritable lately ? Related to stopping the [name]. Call back if any symptoms are worse. In February 2021 the following was started: Zoloft trial for irritability and anxiety.   25 mg for 7 day then 50 mg daily.  As of 04/01/20 the following noted: Wife irritable and not feeling well and hasn't noticed any effect of his starting Zoloft.  She gets mad over [name]'s care by pt. I've seen a little difference in calmer in situations at home with Zoloft 50.  No SE.  1 cup coffee and a drink a day.  Trazodone still helps sleep.    06/13/20 appt with the following noted: Adderall helping productivity and focus.  Wife notices Out of sertraline.  Couldn't tell a lot of difference with the Zoloft but not sure. No SE Adderall. I know I'm bipolar bc can get on a high and drop to a low.  Can be hyperverbal, hyper energetic.  Wife says he's stuck in the past. Wishes he could get rid of depression.  Can enjoy some things. Plan: DC sertraline bc minimal effect. Latuda 20 mg suggested. Take Latuda with at least 350 calories. Take 20 mg daily for 4 weeks. If no effect then increase to 40 mg daily.  08/18/20 appt with the following noted: No change with Latuda 20 that was any better and stopped it.  Not sure he took 40 mg daily. Seems like he's busier than he's been but also some free time.  Taking care of [name].  Cuts grass for 2 days.  Went to [name] to visit someone this weekend. Stress wife afraid constantly of Covid and he has to wear mask in his own house. Sleep better with trazodone. Plan: Adderall trial 15 BID  01/12/2021 appointment noted: Adderall not helpful and stopped. Past being concerned about [name]'s comments about his focus.  Getting older. Stayed on Wellbutrin 450, lamotrigine 200, trazodone 100 HS.   No SE. Stress aunt driving him crazy but she's good to him.  Handled Xmas OK. Still wonders at times about bipolar bc irritability. Pt reports that mood is Anxious, Depressed, Dysphoric and Irritable but still trying to figure it out. Not a lot to do.  No friends to do things with.  Routine of a loner.  Describes anxiety as Moderate. Anxiety symptoms include: Excessive  Worry,. Pt reports no sleep issues. Pt reports that appetite is good. Pt reports that energy is lethargic and down slightly and loss of interest or pleasure in usual activities. Concentration is difficulty with focus and attention. Suicidal thoughts:  denied by patient.  Brain doesn't shut off.  .  Hard to push himself into hobbies too.  Unselttl;ed. Bored in retirement and lonely at home.  W preoccupied and consumed with developmentally disabled son [name] and ignores pt.  Even harder time keeping [name] and managing his emotions about it.  Some rumination and guilt on his thoughts. Plan: Reduce lamotrigine to 1 and 1/2 of the 100 mg tablets for 4 weeks, Then reduce lamotrigine to 1 of the tablets for 4 weeks,  Then reduce lamotrigine to 1/2 tablets for 2 weeks,  Then stop lamotrigine  05/14/21 appt noted: Dep OK but nothing to do. Can't stand [name] anymore, more irritating.   Not sleeping good with trazodone.  Trouble going to sleep.  To sleep 1230 and up 830.  Just got new CPAP and it helps. To sleep 2 am. Also on Wellbutrin xl 450 mg AM. NO SE Weaned off lamotrigine and no differences noticed. He feels the # eipisodes with W have lessened in frequency and intensity. Plan: Increase trazodone to 150 mg HS for sleep and might help mood.  07/24/21 appt noted: Took care of aunt for couple years and now caring for mother and son more.  Avoids wife as much as they can.  Gets frustrated with situation of mother and W is critical of him all the time.  Back against the wall.  Yesterday wife said I know you hate me bc I want be intimate with me.  No sex in 52 years.   Recognizes his anger at God over losses and burdens and his son's disabillity.   Emotionally reactive esp with wife who triggers anger.  Chronic tension with wife.  She gets upset with minor things at him. Plan: Wants trial of olanzapine 10 mg pm for mood stability  09/17/21 appt noted: Olanzapine sedated and took 3 weeks.  [name]  noticed him sedated. Doing as good as I can do.  Residual irritability.  Rare Xanax but likes it.  I wish life could be different but it's not.  Playing golf more.  Need to lose weight. Wants to continue other meds. Plan: Returned to trazodone 150 mg HS  02/16/22 appt noted: Continues Wellbutrin 450 and trazodone 150 mg HS. Xanax used about 0.5 mg daily. Recognizes things are not going to get better at home.  Still plays golf on occasion.  Enjoyed that when he did it.  Chronic stress at home.  Takes a trip in April.   Not sure he's clinically depressed but doesn't like what's going on at home.  She is constantly critical of him.   Sleeps with trazodone. No SE. Reads and watches TV and tries to isolate from her to keep the peace.   04/22/22 appt noted: Less explosive with change to Auvelity from Wellbutrin per wife.  Took it 6 week-8 weeks. No SE Ran out and back on Wellbutrin for 5 dayh He thinks maybe he felt somewhat better on Auvelity.  Didn't feel as down. Wife had arthroscopic knee surgery so he's caring for [name] for 5-6 weeks with big change in routine. Routine at night still same with [name].   Plan: Both he and wife noted better mood with Auvelity than wellbiutrin, wo will attempt to get it for him.  05/24/2022 appointment the following noted: Taking Auvelity 1 BID and 300 mg Wellbutrin reduced to 300 mg daily.Idelle Crouch has helped with mood.  Still has focus problems. More stress last 3 mos than in life with wife's knee surgery taking care of [name]. Wife says he still has outbursts, he says it's rare and triggered only by wife's criticism of him. Sleep good. No other med concerns except feels a little euphoric and disoriented first thing in the AM after meds. Does grapple with age.  Past Psychiatric Medication Trials: [name] 1.5, Abilify side effects in 2012, risperidone, [name] no response, Latuda 20 for 3 weeks NR, olanzapine 10 SE sed lithium, lamotrigine NR,   paroxetine, venlafaxine,  nortriptyline, duloxetine,  Pristiq, amitriptyline, Welllbutrin 450 Ambien cognitive side effects, Lunesta cognitive side effects, trazodone,  Xanax, buspirone Evekeo, Nuvigil, Concerta, Vyvanse, Adderall NR  Review of Systems:  Review of Systems  HENT:  Positive for hearing loss.   Cardiovascular:  Positive for chest pain. Negative for palpitations.  Musculoskeletal:  Positive for back pain.  Neurological:  Negative for dizziness and tremors.  Psychiatric/Behavioral:  Positive for dysphoric mood. Negative for agitation, behavioral problems, confusion, decreased concentration, hallucinations, self-injury, sleep disturbance and suicidal ideas. The patient is nervous/anxious. The patient is not hyperactive.    Medications: I have reviewed the patient's current medications.  Current Outpatient Medications  Medication Sig Dispense Refill   aspirin EC 81 MG tablet Take 81 mg by mouth daily. Swallow whole.     atorvastatin (LIPITOR) 10 MG tablet Take 10 mg by mouth daily.     bimatoprost (LUMIGAN) 0.03 % ophthalmic solution Place 1 drop into both eyes at bedtime.     brimonidine (ALPHAGAN) 0.2 % ophthalmic solution Place 1 drop into both eyes 2 (two) times daily.      dorzolamide-timolol (COSOPT) 22.3-6.8 MG/ML ophthalmic solution Place 1 drop into both eyes 2 (two) times daily.     fluticasone (FLONASE) 50 MCG/ACT nasal spray Place 2 sprays into both nostrils daily as needed for allergies.   2   pantoprazole (PROTONIX) 40 MG tablet Take 1 tablet (40 mg total) by mouth 2 (two) times daily before a meal. 60 tablet 5   timolol (TIMOPTIC) 0.5 % ophthalmic solution 1 drop Two (2) times a day.     vitamin B-12 (CYANOCOBALAMIN) 500 MCG tablet Take 500 mcg by mouth every evening.     ALPRAZolam (XANAX) 0.5 MG tablet Take 1 tablet (0.5 mg total) by mouth 2 (two) times daily as needed for anxiety. 60 tablet 3   buPROPion (WELLBUTRIN XL) 150 MG 24 hr tablet Take 1 tablet (150  mg total) by  mouth daily. 90 tablet 0   Dextromethorphan-buPROPion ER (AUVELITY) 45-105 MG TBCR Take 1 tablet by mouth 2 (two) times daily. 60 tablet 2   traZODone (DESYREL) 150 MG tablet Take 1 tablet (150 mg total) by mouth at bedtime. 90 tablet 0   No current facility-administered medications for this visit.    Medication Side Effects: None  Allergies:  Allergies  Allergen Reactions   Ambien [Zolpidem Tartrate]     Sleep walks   Bactrim [Sulfamethoxazole-Trimethoprim] Hives and Swelling   Dilaudid [Hydromorphone Hcl] Other (See Comments)    Made blood pressure drop with colon resection surgery in 2007    Morphine And Related Nausea And Vomiting    Past Medical History:  Diagnosis Date   Anemia    hx of    Arthritis    fingers    BPH (benign prostatic hyperplasia)    Colon cancer ([place])    stage II, diagnosed 8250   Complication of anesthesia    vagal response after umbilcal hernia at [facility]   Depression    Diabetes mellitus    no as of 09/08/16 due to weight loss    GERD (gastroesophageal reflux disease)    Glaucoma 2012   H. pylori infection 10/2015   Documented eradication March 2017   History of kidney stones    Sleep apnea    wears CiPaP at night   Staph infection 2008   left side of face    Family History  Problem Relation Age of Onset   Lung cancer Father    Diabetes Mother    Anesthesia problems Neg Hx    Hypotension Neg Hx    Malignant hyperthermia Neg Hx    Pseudochol deficiency Neg Hx    Colon cancer Neg Hx     Social History   Socioeconomic History   Marital status: Married    Spouse name: Not on file   Number of children: 1   Years of education: Not on file   Highest education level: Not on file  Occupational History   Occupation: school system    Employer: [employer]  Tobacco Use   Smoking status: Never   Smokeless tobacco: Never   Tobacco comments:    Never smoked  Vaping Use   Vaping Use: Never used  Substance and  Sexual Activity   Alcohol use: No    Alcohol/week: 0.0 standard drinks   Drug use: No   Sexual activity: Yes    Birth control/protection: None  Other Topics Concern   Not on file  Social History Narrative   Not on file   Social Determinants of Health   Financial Resource Strain: Not on file  Food Insecurity: Not on file  Transportation Needs: Not on file  Physical Activity: Not on file  Stress: Not on file  Social Connections: Not on file  Intimate Partner Violence: Not on file    Past Medical History, Surgical history, Social history, and Family history were reviewed and updated as appropriate.   Please see review of systems for further details on the patient's review from today.   Objective:   Physical Exam:  There were no vitals taken for this visit.  Physical Exam Constitutional:      General: He is not in acute distress.    Appearance: He is well-developed.  Musculoskeletal:        General: No deformity.  Neurological:     Mental Status: He is alert and oriented to person, place, and time.  Cranial Nerves: No dysarthria.     Coordination: Coordination normal.  Psychiatric:        Attention and Perception: Perception normal. He is inattentive. He does not perceive auditory or visual hallucinations.        Mood and Affect: Mood is depressed. Mood is not anxious. Affect is not labile, blunt, angry, tearful or inappropriate.        Speech: Speech normal. Speech is not rapid and pressured.        Behavior: Behavior normal. Behavior is cooperative.        Thought Content: Thought content normal. Thought content is not paranoid or delusional. Thought content does not include homicidal or suicidal ideation. Thought content does not include suicidal plan.        Cognition and Memory: Cognition and memory normal.        Judgment: Judgment normal.     Comments: Chronic dysthymia is unchanged.   Both he and wife noted better mood with Auvelity than wellbiutrin, wo will  attempt to get it for him. He is not particularly anxious this time. Irritability is present and chronic.    Lab Review:     Component Value Date/Time   NA 136 11/03/2021 1542   NA 141 12/30/2020 1137   K 4.4 11/03/2021 1542   CL 103 11/03/2021 1542   CO2 26 11/03/2021 1542   GLUCOSE 106 (H) 11/03/2021 1542   BUN 16 11/03/2021 1542   BUN 19 12/30/2020 1137   CREATININE 1.08 11/03/2021 1542   CREATININE 0.90 12/11/2019 0832   CALCIUM 8.9 11/03/2021 1542   PROT 6.4 (L) 08/13/2019 1143   ALBUMIN 3.7 08/13/2019 1143   AST 17 08/13/2019 1143   ALT 13 08/13/2019 1143   ALKPHOS 79 08/13/2019 1143   BILITOT 0.7 08/13/2019 1143   GFRNONAA >60 11/03/2021 1542   GFRAA 86 12/30/2020 1137       Component Value Date/Time   WBC 5.1 12/30/2020 1137   WBC 4.6 08/13/2019 1143   RBC 5.23 12/30/2020 1137   RBC 4.76 08/13/2019 1143   HGB 16.0 12/30/2020 1137   HCT 46.9 12/30/2020 1137   PLT 158 12/30/2020 1137   MCV 90 12/30/2020 1137   MCH 30.6 12/30/2020 1137   MCH 29.6 08/13/2019 1143   MCHC 34.1 12/30/2020 1137   MCHC 33.1 08/13/2019 1143   RDW 13.3 12/30/2020 1137   LYMPHSABS 1.2 12/30/2020 1137   MONOABS 0.9 07/31/2019 0235   EOSABS 0.1 12/30/2020 1137   BASOSABS 0.0 12/30/2020 1137    No results found for: POCLITH, LITHIUM   No results found for: PHENYTOIN, PHENOBARB, VALPROATE, CBMZ   Normal stress test.  No exercise.  Won't stick with diet. .res Assessment: Plan:    Major depressive disorder, recurrent episode, moderate (HCC) - Plan: buPROPion (WELLBUTRIN XL) 150 MG 24 hr tablet, Dextromethorphan-buPROPion ER (AUVELITY) 45-105 MG TBCR  Episodic mood disorder (HCC) - Plan: buPROPion (WELLBUTRIN XL) 150 MG 24 hr tablet  Dysthymia - Plan: Dextromethorphan-buPROPion ER (AUVELITY) 45-105 MG TBCR  Generalized anxiety disorder - Plan: ALPRAZolam (XANAX) 0.5 MG tablet  Attention deficit hyperactivity disorder (ADHD), combined type  Insomnia due to mental condition -  Plan: traZODone (DESYREL) 150 MG tablet   Greater than 50% of 30 min face to face time with patient was spent on counseling and coordination of care. 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  He wonders if he has bipolar disorder.  He has failed multiple medications  listed above.   Supportive therapy on problems with home life.  Trying to motivate for the hobbies.  Disc anger turned inwards can be a source of depression.  Caring for mother with brother.  Disc conflict with wife at home chronically.  Xanax 0.5 mg bid prn anxiety. Using rarely for sleep and anxiety.  It is worth noting that he was worse with irritability and edginess after reducing the Wellbutrin to 300 mg daily and better at 450 mg daily  Benefit Auvelity with mood but maybe some SE so reduce Wellbutrin to 150 mg AM.  Did worse as noted with only 300 mg daily.  Chronic attention problems remain and he is failed multiple ADD meds including off label things such as modafinil and Nuvigil .  His wife complains of his ADD symptoms as well.     Encourage more golf as much as possible..    Returned to trazodone 150 mg HS No indication for changes in meds per his request  This appt was 30 mins.  FU 2-3 mos  Lynder Parents, MD, DFAPA   Please see After Visit Summary for patient specific instructions.  Future Appointments  Date Time Provider [place]  02/03/2023 11:00 AM [facility] None  02/10/2023  1:30 PM Irine Seal, MD [facility] None    No orders of the defined types were placed in this encounter.      -------------------------------

## 2022-05-25 DIAGNOSIS — Z6831 Body mass index (BMI) 31.0-31.9, adult: Secondary | ICD-10-CM | POA: Diagnosis not present

## 2022-05-25 DIAGNOSIS — H6980 Other specified disorders of Eustachian tube, unspecified ear: Secondary | ICD-10-CM | POA: Diagnosis not present

## 2022-05-25 DIAGNOSIS — J309 Allergic rhinitis, unspecified: Secondary | ICD-10-CM | POA: Diagnosis not present

## 2022-05-25 DIAGNOSIS — I10 Essential (primary) hypertension: Secondary | ICD-10-CM | POA: Diagnosis not present

## 2022-06-08 ENCOUNTER — Ambulatory Visit: Payer: Medicare PPO | Admitting: Internal Medicine

## 2022-06-08 ENCOUNTER — Encounter: Payer: Self-pay | Admitting: Internal Medicine

## 2022-06-08 VITALS — BP 142/71 | HR 53 | Temp 97.1°F | Ht 69.0 in | Wt 217.8 lb

## 2022-06-08 DIAGNOSIS — Z85038 Personal history of other malignant neoplasm of large intestine: Secondary | ICD-10-CM | POA: Diagnosis not present

## 2022-06-08 DIAGNOSIS — K219 Gastro-esophageal reflux disease without esophagitis: Secondary | ICD-10-CM

## 2022-06-08 NOTE — Patient Instructions (Signed)
It was good to see you again today!  Continue Protonix 40 mg before breakfast daily; if a second dose is needed best to take it before supper  As discussed, we will plan to perform a surveillance colonoscopy in 2025.  If you have any interim problems prior to your next office visit please let me know  We will plan to see you back in the office here in 1 year.

## 2022-06-08 NOTE — Progress Notes (Unsigned)
Primary Care Physician:  Riley Lam Primary Gastroenterologist:  Dr. Gala Romney  Pre-Procedure History & Physical: HPI:  [name] is a 71 y.o. male here for follow-up.  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  History of anastomotic ulcer seen on prior colonoscopy.  Labs all good.  Clinically no GI symptoms at this time aside from GERD well-controlled on Protonix 40 mg daily; rarely takes a second dose.  No dysphagia.  Weight fairly stable.  He will be due for a surveillance colonoscopy 2025.  Past Medical History:  Diagnosis Date   Anemia    hx of    Arthritis    fingers    BPH (benign prostatic hyperplasia)    Colon cancer ([place])    stage II, diagnosed 8144   Complication of anesthesia    vagal response after umbilcal hernia at [facility]   Depression    Diabetes mellitus    no as of 09/08/16 due to weight loss    GERD (gastroesophageal reflux disease)    Glaucoma 2012   H. pylori infection 10/2015   Documented eradication March 2017   History of kidney stones    Sleep apnea    wears CiPaP at night   Staph infection 2008   left side of face    Past Surgical History:  Procedure Laterality Date   AGILE CAPSULE N/A 10/29/2019   Procedure: AGILE CAPSULE;  Surgeon: Daneil Dolin, MD;  [facility];  Service: Endoscopy;  Laterality: N/A;  7:30am   APPENDECTOMY     BIOPSY  10/11/2019   Procedure: BIOPSY;  Surgeon: Daneil Dolin, MD;  [facility]: [facility];  Service: Endoscopy;;  colon   CARDIAC CATHETERIZATION     COLECTOMY  2007   right hemicolectomy   COLONOSCOPY  05/11/10   normal rectum,normal residual colon with some angry appearring anastomtic mucosa with some erosions and oozing, bx unremarkable   COLONOSCOPY  05/16/2012   Dr. Gala Romney- normal rectum, sigmoid diverticulosis, anastomotic ulcers. bx= acute ulcer and benign colonic mucosa with intramucosal lympoid aggregates   COLONOSCOPY N/A 11/17/2016   Dr.  Gala Romney: Ulcerated mucosa were present at the anastomosis. benign bx. next TCS in 10/2021.   COLONOSCOPY WITH PROPOFOL N/A 10/11/2019   Dr. Gala Romney: Diverticulosis in the sigmoid colon and descending colon.  Focal ulcerations distal neoterminal ileum uncertain significance.  Biopsies were benign with no evidence of malignancy.  Givens capsule completed after colonoscopy.  5-year surveillance colonoscopy advised.   ESOPHAGEAL DILATION N/A 10/28/2015   Procedure: ESOPHAGEAL DILATION;  Surgeon: Daneil Dolin, MD;  [facility];  Service: Endoscopy;  Laterality: N/A;   ESOPHAGOGASTRODUODENOSCOPY N/A 10/28/2015   RMR: ulcerated proximal esophageal mass ad described status post biopsy. Abnormal distal esophagus biopsy. status post biopsy. Hiatal hernia . abnormal gastric mucosa doubt clinical significance status post biopsy   ESOPHAGOGASTRODUODENOSCOPY (EGD) WITH PROPOFOL N/A 11/07/2017   Dr. Gala Romney: Normal study.     EUS N/A 11/20/2015   Procedure: UPPER ENDOSCOPIC ULTRASOUND (EUS) LINEAR;  Surgeon: Milus Banister, MD;  [facility]: [facility];  Service: Endoscopy;  Laterality: N/A;   GIVENS CAPSULE STUDY N/A 11/12/2019   Scattered erosions throughout the small bowel.  Ulceration again seen at the anastomosis.  No active bleeding.  Question ischemia   HEMORRHOID SURGERY  12/31/2011   Procedure: HEMORRHOIDECTOMY;  Surgeon: Jamesetta So;  Location: [facility];  Service: General;  Laterality: N/A;   HERNIA  REPAIR     umbilical   KIDNEY STONE SURGERY     PILONIDAL CYST / SINUS EXCISION  1976   SKIN LESION EXCISION     left shoulder/pre cancerous   TRANSURETHRAL RESECTION OF PROSTATE N/A 09/09/2016   Procedure: TRANSURETHRAL RESECTION OF THE PROSTATE (TURP);  Surgeon: Irine Seal, MD;  Location: [facility];  Service: Urology;  Laterality: N/A;    Prior to Admission medications   Medication Sig Start Date End Date Taking? Authorizing Provider  ALPRAZolam Duanne Moron) 0.5 MG tablet Take 1 tablet (0.5 mg total)  by mouth 2 (two) times daily as needed for anxiety. 05/24/22  Yes Cottle, Billey Co., MD  aspirin EC 81 MG tablet Take 81 mg by mouth daily. Swallow whole.   Yes [provider]  atorvastatin (LIPITOR) 10 MG tablet Take 10 mg by mouth daily. 08/05/20  Yes [provider]  bimatoprost (LUMIGAN) 0.03 % ophthalmic solution Place 1 drop into both eyes at bedtime.   Yes [provider]  brimonidine (ALPHAGAN) 0.2 % ophthalmic solution Place 1 drop into both eyes 2 (two) times daily.    Yes [provider]  buPROPion (WELLBUTRIN XL) 150 MG 24 hr tablet Take 1 tablet (150 mg total) by mouth daily. 05/24/22  Yes Cottle, Billey Co., MD  Dextromethorphan-buPROPion ER (AUVELITY) 45-105 MG TBCR Take 1 tablet by mouth 2 (two) times daily. 05/24/22  Yes Cottle, Billey Co., MD  dorzolamide-timolol (COSOPT) 22.3-6.8 MG/ML ophthalmic solution Place 1 drop into both eyes 2 (two) times daily.   Yes [provider]  fluticasone (FLONASE) 50 MCG/ACT nasal spray Place 2 sprays into both nostrils daily as needed for allergies.  09/21/18  Yes [provider]  latanoprost (XALATAN) 0.005 % ophthalmic solution 1 drop at bedtime. 05/15/22  Yes [provider]  Multiple Vitamin (MULTIVITAMIN) tablet Take 1 tablet by mouth daily.   Yes [provider]  pantoprazole (PROTONIX) 40 MG tablet Take 1 tablet (40 mg total) by mouth 2 (two) times daily before a meal. 07/10/19  Yes Mahala Menghini, PA-C  traZODone (DESYREL) 150 MG tablet Take 1 tablet (150 mg total) by mouth at bedtime. 05/24/22  Yes Cottle, Billey Co., MD  vitamin B-12 (CYANOCOBALAMIN) 500 MCG tablet Take 500 mcg by mouth every evening.   Yes [provider]    Allergies as of 06/08/2022 - Review Complete 06/08/2022  Allergen Reaction Noted   Ambien [zolpidem tartrate]  09/12/2018   Bactrim [sulfamethoxazole-trimethoprim] Hives and Swelling 09/08/2016   Dilaudid [hydromorphone hcl] Other (See  Comments) 09/08/2016   Morphine and related Nausea And Vomiting 09/08/2016    Family History  Problem Relation Age of Onset   Lung cancer Father    Diabetes Mother    Anesthesia problems Neg Hx    Hypotension Neg Hx    Malignant hyperthermia Neg Hx    Pseudochol deficiency Neg Hx    Colon cancer Neg Hx     Social History   Socioeconomic History   Marital status: Married    Spouse name: Not on file   Number of children: 1   Years of education: Not on file   Highest education level: Not on file  Occupational History   Occupation: school system    Employer: [employer]  Tobacco Use   Smoking status: Never   Smokeless tobacco: Never   Tobacco comments:    Never smoked  Vaping Use   Vaping Use: Never used  Substance and  Sexual Activity   Alcohol use: No    Alcohol/week: 0.0 standard drinks of alcohol   Drug use: No   Sexual activity: Yes    Birth control/protection: None  Other Topics Concern   Not on file  Social History Narrative   Not on file   Social Determinants of Health   Financial Resource Strain: Not on file  Food Insecurity: Not on file  Transportation Needs: Not on file  Physical Activity: Not on file  Stress: Not on file  Social Connections: Not on file  Intimate Partner Violence: Not on file    Review of Systems: See HPI, otherwise negative ROS  Physical Exam: BP (!) 142/71 (BP Location: Right Arm, Patient Position: Sitting, Cuff Size: Normal)   Pulse (!) 53   Temp (!) 97.1 F (36.2 C) (Temporal)   Ht '5\' 9"'$  (1.753 m)   Wt 217 lb 12.8 oz (98.8 kg)   SpO2 96%   BMI 32.16 kg/m  General:   Alert,  Well-developed, well-nourished, pleasant and cooperative in NAD SNeck:  Supple; no masses or thyromegaly. No significant cervical adenopathy. Lungs:  Clear throughout to auscultation.   No wheezes, crackles, or rhonchi. No acute distress. Heart:  Regular rate and rhythm; no murmurs, clicks, rubs,  or gallops. Abdomen: Non-distended, normal  bowel sounds.  Soft and nontender without appreciable mass or hepatosplenomegaly.  Pulses:  Normal pulses noted. Extremities:  Without clubbing or edema.  Impression/Plan: Distant history of stage II colon cancer cured with resection.  He is good on surveillance colonoscopy history of a small anastomotic ulceration may be endorgan ischemia-not clinically significant.  Due for surveillance colonoscopy 2025  GERD well-controlled on Protonix 40 mg basically once daily no alarm features.  Recommendations:  Continue Protonix 40 mg before breakfast daily; if a second dose is needed best to take it before supper  As discussed, we will plan to perform a surveillance colonoscopy in 2025.  If you have any interim problems prior to your next office visit please let me know  We will plan to see you back in the office here in 1 year.   Notice: This dictation was prepared with Dragon dictation along with smaller phrase technology. Any transcriptional errors that result from this process are unintentional and may not be corrected upon review.

## 2022-06-08 NOTE — Progress Notes (Unsigned)
Primary Care Physician:  Encarnacion Slates, PA-C Primary Gastroenterologist:  Dr.   Pre-Procedure History & Physical: HPI:  [name] is a 71 y.o. male here for   Past Medical History:  Diagnosis Date   Anemia    hx of    Arthritis    fingers    BPH (benign prostatic hyperplasia)    Colon cancer ([facility])    stage II, diagnosed 2007   Complication of anesthesia    vagal response after umbilcal hernia at [facility]   Depression    Diabetes mellitus    no as of 09/08/16 due to weight loss    GERD (gastroesophageal reflux disease)    Glaucoma 2012   H. pylori infection 10/2015   Documented eradication March 2017   History of kidney stones    Sleep apnea    wears CiPaP at night   Staph infection 2008   left side of face    Past Surgical History:  Procedure Laterality Date   AGILE CAPSULE N/A 10/29/2019   Procedure: AGILE CAPSULE;  Surgeon: Corbin Ade, MD;  [facility];  Service: Endoscopy;  Laterality: N/A;  7:30am   APPENDECTOMY     BIOPSY  10/11/2019   Procedure: BIOPSY;  Surgeon: Corbin Ade, MD;  [facility];  Service: Endoscopy;;  colon   CARDIAC CATHETERIZATION     COLECTOMY  2007   right hemicolectomy   COLONOSCOPY  05/11/10   normal rectum,normal residual colon with some angry appearring anastomtic mucosa with some erosions and oozing, bx unremarkable   COLONOSCOPY  05/16/2012   Dr. Jena Gauss- normal rectum, sigmoid diverticulosis, anastomotic ulcers. bx= acute ulcer and benign colonic mucosa with intramucosal lympoid aggregates   COLONOSCOPY N/A 11/17/2016   Dr. Jena Gauss: Ulcerated mucosa were present at the anastomosis. benign bx. next TCS in 10/2021.   COLONOSCOPY WITH PROPOFOL N/A 10/11/2019   Dr. Jena Gauss: Diverticulosis in the sigmoid colon and descending colon.  Focal ulcerations distal neoterminal ileum uncertain significance.  Biopsies were benign with no evidence of malignancy.  Givens capsule completed after colonoscopy.  5-year  surveillance colonoscopy advised.   ESOPHAGEAL DILATION N/A 10/28/2015   Procedure: ESOPHAGEAL DILATION;  Surgeon: Corbin Ade, MD;  [facility];  Service: Endoscopy;  Laterality: N/A;   ESOPHAGOGASTRODUODENOSCOPY N/A 10/28/2015   RMR: ulcerated proximal esophageal mass ad described status post biopsy. Abnormal distal esophagus biopsy. status post biopsy. Hiatal hernia . abnormal gastric mucosa doubt clinical significance status post biopsy   ESOPHAGOGASTRODUODENOSCOPY (EGD) WITH PROPOFOL N/A 11/07/2017   Dr. Jena Gauss: Normal study.     EUS N/A 11/20/2015   Procedure: UPPER ENDOSCOPIC ULTRASOUND (EUS) LINEAR;  Surgeon: Rachael Fee, MD;  [facility];  Service: Endoscopy;  Laterality: N/A;   GIVENS CAPSULE STUDY N/A 11/12/2019   Scattered erosions throughout the small bowel.  Ulceration again seen at the anastomosis.  No active bleeding.  Question ischemia   HEMORRHOID SURGERY  12/31/2011   Procedure: HEMORRHOIDECTOMY;  Surgeon: [name];  Location: [facility];  Service: General;  Laterality: N/A;   HERNIA REPAIR     umbilical   KIDNEY STONE SURGERY     PILONIDAL CYST / SINUS EXCISION  1976   SKIN LESION EXCISION     left shoulder/pre cancerous   TRANSURETHRAL RESECTION OF PROSTATE N/A 09/09/2016   Procedure: TRANSURETHRAL RESECTION OF THE PROSTATE (TURP);  Surgeon: Bjorn Pippin, MD;  Location: [facility];  Service: Urology;  Laterality: N/A;  Prior to Admission medications   Medication Sig Start Date End Date Taking? Authorizing Provider  ALPRAZolam Prudy Feeler) 0.5 MG tablet Take 1 tablet (0.5 mg total) by mouth 2 (two) times daily as needed for anxiety. 05/24/22  Yes Cottle, Steva Ready., MD  aspirin EC 81 MG tablet Take 81 mg by mouth daily. Swallow whole.   Yes [provider]  atorvastatin (LIPITOR) 10 MG tablet Take 10 mg by mouth daily. 08/05/20  Yes [provider]  bimatoprost (LUMIGAN) 0.03 % ophthalmic solution Place 1 drop into both eyes at bedtime.    Yes [provider]  brimonidine (ALPHAGAN) 0.2 % ophthalmic solution Place 1 drop into both eyes 2 (two) times daily.    Yes [provider]  buPROPion (WELLBUTRIN XL) 150 MG 24 hr tablet Take 1 tablet (150 mg total) by mouth daily. 05/24/22  Yes Cottle, Steva Ready., MD  Dextromethorphan-buPROPion ER (AUVELITY) 45-105 MG TBCR Take 1 tablet by mouth 2 (two) times daily. 05/24/22  Yes Cottle, Steva Ready., MD  dorzolamide-timolol (COSOPT) 22.3-6.8 MG/ML ophthalmic solution Place 1 drop into both eyes 2 (two) times daily.   Yes [provider]  fluticasone (FLONASE) 50 MCG/ACT nasal spray Place 2 sprays into both nostrils daily as needed for allergies.  09/21/18  Yes [provider]  latanoprost (XALATAN) 0.005 % ophthalmic solution 1 drop at bedtime. 05/15/22  Yes [provider]  Multiple Vitamin (MULTIVITAMIN) tablet Take 1 tablet by mouth daily.   Yes [provider]  pantoprazole (PROTONIX) 40 MG tablet Take 1 tablet (40 mg total) by mouth 2 (two) times daily before a meal. 07/10/19  Yes Tiffany Kocher, PA-C  traZODone (DESYREL) 150 MG tablet Take 1 tablet (150 mg total) by mouth at bedtime. 05/24/22  Yes Cottle, Steva Ready., MD  vitamin B-12 (CYANOCOBALAMIN) 500 MCG tablet Take 500 mcg by mouth every evening.   Yes [provider]    Allergies as of 06/08/2022 - Review Complete 06/08/2022  Allergen Reaction Noted   Ambien [zolpidem tartrate]  09/12/2018   Bactrim [sulfamethoxazole-trimethoprim] Hives and Swelling 09/08/2016   Dilaudid [hydromorphone hcl] Other (See Comments) 09/08/2016   Morphine and related Nausea And Vomiting 09/08/2016    Family History  Problem Relation Age of Onset   Lung cancer Father    Diabetes Mother    Anesthesia problems Neg Hx    Hypotension Neg Hx    Malignant hyperthermia Neg Hx    Pseudochol deficiency Neg Hx    Colon cancer Neg Hx     Social History   Socioeconomic History   Marital status:  Married    Spouse name: Not on file   Number of children: 1   Years of education: Not on file   Highest education level: Not on file  Occupational History   Occupation: school system    Employer: [employer]  Tobacco Use   Smoking status: Never   Smokeless tobacco: Never   Tobacco comments:    Never smoked  Vaping Use   Vaping Use: Never used  Substance and Sexual Activity   Alcohol use: No    Alcohol/week: 0.0 standard drinks of alcohol   Drug use: No   Sexual activity: Yes    Birth control/protection: None  Other Topics Concern   Not on file  Social History Narrative   Not on file   Social Determinants of Health   Financial Resource Strain: Not on file  Food Insecurity: Not on  file  Transportation Needs: Not on file  Physical Activity: Not on file  Stress: Not on file  Social Connections: Not on file  Intimate Partner Violence: Not on file    Review of Systems: See HPI, otherwise negative ROS  Physical Exam: BP (!) 142/71 (BP Location: Right Arm, Patient Position: Sitting, Cuff Size: Normal)   Pulse (!) 53   Temp (!) 97.1 F (36.2 C) (Temporal)   Ht 5\' 9"  (1.753 m)   Wt 217 lb 12.8 oz (98.8 kg)   SpO2 96%   BMI 32.16 kg/m  General:   Alert,  Well-developed, well-nourished, pleasant and cooperative in NAD Skin:  Intact without significant lesions or rashes. Eyes:  Sclera clear, no icterus.   Conjunctiva pink. Ears:  Normal auditory acuity. Nose:  No deformity, discharge,  or lesions. Mouth:  No deformity or lesions. Neck:  Supple; no masses or thyromegaly. No significant cervical adenopathy. Lungs:  Clear throughout to auscultation.   No wheezes, crackles, or rhonchi. No acute distress. Heart:  Regular rate and rhythm; no murmurs, clicks, rubs,  or gallops. Abdomen: Non-distended, normal bowel sounds.  Soft and nontender without appreciable mass or hepatosplenomegaly.  Pulses:  Normal pulses noted. Extremities:  Without clubbing or  edema.  Impression/Plan:  ***     Notice: This dictation was prepared with Dragon dictation along with smaller phrase technology. Any transcriptional errors that result from this process are unintentional and may not be corrected upon review.

## 2022-06-15 ENCOUNTER — Ambulatory Visit: Payer: Medicare PPO | Admitting: Internal Medicine

## 2022-06-17 DIAGNOSIS — H6123 Impacted cerumen, bilateral: Secondary | ICD-10-CM | POA: Diagnosis not present

## 2022-06-23 DIAGNOSIS — L570 Actinic keratosis: Secondary | ICD-10-CM | POA: Diagnosis not present

## 2022-06-23 DIAGNOSIS — X32XXXD Exposure to sunlight, subsequent encounter: Secondary | ICD-10-CM | POA: Diagnosis not present

## 2022-06-23 DIAGNOSIS — L720 Epidermal cyst: Secondary | ICD-10-CM | POA: Diagnosis not present

## 2022-06-23 DIAGNOSIS — H00019 Hordeolum externum unspecified eye, unspecified eyelid: Secondary | ICD-10-CM | POA: Diagnosis not present

## 2022-06-29 ENCOUNTER — Other Ambulatory Visit: Payer: Self-pay | Admitting: Psychiatry

## 2022-06-29 DIAGNOSIS — F5105 Insomnia due to other mental disorder: Secondary | ICD-10-CM

## 2022-07-09 DIAGNOSIS — R030 Elevated blood-pressure reading, without diagnosis of hypertension: Secondary | ICD-10-CM | POA: Diagnosis not present

## 2022-07-09 DIAGNOSIS — H6123 Impacted cerumen, bilateral: Secondary | ICD-10-CM | POA: Diagnosis not present

## 2022-07-21 DIAGNOSIS — L820 Inflamed seborrheic keratosis: Secondary | ICD-10-CM | POA: Diagnosis not present

## 2022-07-21 DIAGNOSIS — H6123 Impacted cerumen, bilateral: Secondary | ICD-10-CM | POA: Diagnosis not present

## 2022-07-27 ENCOUNTER — Ambulatory Visit: Payer: Medicare PPO | Admitting: Psychiatry

## 2022-07-27 ENCOUNTER — Encounter: Payer: Self-pay | Admitting: Psychiatry

## 2022-07-27 ENCOUNTER — Other Ambulatory Visit: Payer: Self-pay | Admitting: Psychiatry

## 2022-07-27 DIAGNOSIS — F331 Major depressive disorder, recurrent, moderate: Secondary | ICD-10-CM | POA: Diagnosis not present

## 2022-07-27 DIAGNOSIS — F902 Attention-deficit hyperactivity disorder, combined type: Secondary | ICD-10-CM

## 2022-07-27 DIAGNOSIS — F39 Unspecified mood [affective] disorder: Secondary | ICD-10-CM

## 2022-07-27 DIAGNOSIS — F5105 Insomnia due to other mental disorder: Secondary | ICD-10-CM | POA: Diagnosis not present

## 2022-07-27 DIAGNOSIS — F341 Dysthymic disorder: Secondary | ICD-10-CM | POA: Diagnosis not present

## 2022-07-27 DIAGNOSIS — F411 Generalized anxiety disorder: Secondary | ICD-10-CM | POA: Diagnosis not present

## 2022-07-27 MED ORDER — DONEPEZIL HCL 5 MG PO TABS: 5.0000 mg | ORAL_TABLET | Freq: Every day | ORAL | 1 refills | Status: DC
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
## 2022-08-04 DIAGNOSIS — H9201 Otalgia, right ear: Secondary | ICD-10-CM | POA: Diagnosis not present

## 2022-08-04 DIAGNOSIS — R030 Elevated blood-pressure reading, without diagnosis of hypertension: Secondary | ICD-10-CM | POA: Diagnosis not present

## 2022-08-04 DIAGNOSIS — Z6831 Body mass index (BMI) 31.0-31.9, adult: Secondary | ICD-10-CM | POA: Diagnosis not present

## 2022-08-20 DIAGNOSIS — H6123 Impacted cerumen, bilateral: Secondary | ICD-10-CM | POA: Diagnosis not present

## 2022-08-31 DIAGNOSIS — H401131 Primary open-angle glaucoma, bilateral, mild stage: Secondary | ICD-10-CM | POA: Diagnosis not present

## 2022-08-31 DIAGNOSIS — H524 Presbyopia: Secondary | ICD-10-CM | POA: Diagnosis not present

## 2022-09-01 DIAGNOSIS — R059 Cough, unspecified: Secondary | ICD-10-CM | POA: Diagnosis not present

## 2022-09-01 DIAGNOSIS — R509 Fever, unspecified: Secondary | ICD-10-CM | POA: Diagnosis not present

## 2022-09-01 DIAGNOSIS — Z20828 Contact with and (suspected) exposure to other viral communicable diseases: Secondary | ICD-10-CM | POA: Diagnosis not present

## 2022-09-01 DIAGNOSIS — Z6831 Body mass index (BMI) 31.0-31.9, adult: Secondary | ICD-10-CM | POA: Diagnosis not present

## 2022-09-07 ENCOUNTER — Ambulatory Visit: Payer: Medicare PPO | Admitting: Internal Medicine

## 2022-09-13 DIAGNOSIS — Z6831 Body mass index (BMI) 31.0-31.9, adult: Secondary | ICD-10-CM | POA: Diagnosis not present

## 2022-09-13 DIAGNOSIS — J329 Chronic sinusitis, unspecified: Secondary | ICD-10-CM | POA: Diagnosis not present

## 2022-09-13 DIAGNOSIS — I700 Atherosclerosis of aorta: Secondary | ICD-10-CM | POA: Diagnosis not present

## 2022-09-13 DIAGNOSIS — J40 Bronchitis, not specified as acute or chronic: Secondary | ICD-10-CM | POA: Diagnosis not present

## 2022-09-15 ENCOUNTER — Other Ambulatory Visit: Payer: Self-pay | Admitting: Psychiatry

## 2022-09-15 DIAGNOSIS — F341 Dysthymic disorder: Secondary | ICD-10-CM

## 2022-09-15 DIAGNOSIS — F331 Major depressive disorder, recurrent, moderate: Secondary | ICD-10-CM

## 2022-09-23 DIAGNOSIS — H6123 Impacted cerumen, bilateral: Secondary | ICD-10-CM | POA: Diagnosis not present

## 2022-09-24 ENCOUNTER — Other Ambulatory Visit: Payer: Self-pay | Admitting: Psychiatry

## 2022-09-24 DIAGNOSIS — F5105 Insomnia due to other mental disorder: Secondary | ICD-10-CM

## 2022-09-27 ENCOUNTER — Ambulatory Visit: Payer: Medicare PPO | Admitting: Internal Medicine

## 2022-10-04 ENCOUNTER — Encounter: Payer: Self-pay | Admitting: Internal Medicine

## 2022-10-04 ENCOUNTER — Ambulatory Visit: Payer: Medicare PPO | Admitting: Internal Medicine

## 2022-10-04 VITALS — BP 144/77 | HR 76 | Temp 97.9°F | Ht 69.0 in | Wt 216.8 lb

## 2022-10-04 DIAGNOSIS — K219 Gastro-esophageal reflux disease without esophagitis: Secondary | ICD-10-CM

## 2022-10-04 DIAGNOSIS — Z85038 Personal history of other malignant neoplasm of large intestine: Secondary | ICD-10-CM

## 2022-10-04 DIAGNOSIS — R151 Fecal smearing: Secondary | ICD-10-CM | POA: Diagnosis not present

## 2022-10-04 MED ORDER — PANTOPRAZOLE SODIUM 40 MG PO TBEC: 40.0000 mg | DELAYED_RELEASE_TABLET | Freq: Every day | ORAL | 3 refills | Status: DC

## 2022-10-04 NOTE — Patient Instructions (Signed)
It was good seeing you again today!  Your rectal exam was normal.  There was no blood in your stool.  Please begin Metamucil or psyllium fiber supplement 1 dose daily.  May cause transient bloating/gas initially but will go away over time.  The idea here is to bulk up your stool so you evacuate more efficiently/completely with each bowel movement so as to decrease the likelihood of any fecal seepage at night.  Keep a record of any seepage  Office visit with me in 3 months  We will continue to keep plans for repeat colonoscopy in 2025.  Continue Protonix 40 mg once daily before breakfast.  New prescription dispense 90 with 3 refills.  Call me prior to your next office visit if you are having any problems.

## 2022-10-04 NOTE — Progress Notes (Signed)
Primary Care Physician:  Jalene Mullet, PA-C Primary Gastroenterologist:  Dr. Gala Romney  Pre-Procedure History & Physical: HPI:  [name] is a 71 y.o. male with history of stage II colon cancer resected back in 2008 returns complaining of half a dozen episodes of fecal seepage stooling at night unbeknownst to patient while he is asleep.  No issues during the day.  Describes 1-2 [place] 3-4 stools daily.  He has not seen any blood.  No melena.  No associated abdominal pain.  Denies having to strain with a bowel movement. Will be due for surveillance colonoscopy 2025.  History of small anastomotic ulcer seen previously biopsies benign.  Likely focal ischemia in origin.  Reflux well-controlled on Protonix 40 mg once daily.  No dysphagia.  He does not take any bowels supplements.  Incidentally, he tells me that his PCP sent him for pelvic floor strengthening exercises because of urinary dribbling.  He stated that helped quite a bit.  He takes Aricept prescribed by his PCP.  Prior surgical hemorrhoidectomy by Dr. Arnoldo Morale.  Patient continues to feel that procedure helped him quite a bit.  Past Medical History:  Diagnosis Date   Anemia    hx of    Arthritis    fingers    BPH (benign prostatic hyperplasia)    Colon cancer ([place])    stage II, diagnosed 1478   Complication of anesthesia    vagal response after umbilcal hernia at [facility]   Depression    Diabetes mellitus    no as of 09/08/16 due to weight loss    GERD (gastroesophageal reflux disease)    Glaucoma 2012   H. pylori infection 10/2015   Documented eradication March 2017   History of kidney stones    Sleep apnea    wears CiPaP at night   Staph infection 2008   left side of face    Past Surgical History:  Procedure Laterality Date   AGILE CAPSULE N/A 10/29/2019   Procedure: AGILE CAPSULE;  Surgeon: Daneil Dolin, MD;  Location: [facility];  Service: Endoscopy;  Laterality: N/A;  7:30am   APPENDECTOMY     BIOPSY   10/11/2019   Procedure: BIOPSY;  Surgeon: Daneil Dolin, MD;  Location: [facility];  Service: Endoscopy;;  colon   CARDIAC CATHETERIZATION     COLECTOMY  2007   right hemicolectomy   COLONOSCOPY  05/11/10   normal rectum,normal residual colon with some angry appearring anastomtic mucosa with some erosions and oozing, bx unremarkable   COLONOSCOPY  05/16/2012   Dr. Gala Romney- normal rectum, sigmoid diverticulosis, anastomotic ulcers. bx= acute ulcer and benign colonic mucosa with intramucosal lympoid aggregates   COLONOSCOPY N/A 11/17/2016   Dr. Gala Romney: Ulcerated mucosa were present at the anastomosis. benign bx. next TCS in 10/2021.   COLONOSCOPY WITH PROPOFOL N/A 10/11/2019   Dr. Gala Romney: Diverticulosis in the sigmoid colon and descending colon.  Focal ulcerations distal neoterminal ileum uncertain significance.  Biopsies were benign with no evidence of malignancy.  Givens capsule completed after colonoscopy.  5-year surveillance colonoscopy advised.   ESOPHAGEAL DILATION N/A 10/28/2015   Procedure: ESOPHAGEAL DILATION;  Surgeon: Daneil Dolin, MD;  Location: [facility];  Service: Endoscopy;  Laterality: N/A;   ESOPHAGOGASTRODUODENOSCOPY N/A 10/28/2015   RMR: ulcerated proximal esophageal mass ad described status post biopsy. Abnormal distal esophagus biopsy. status post biopsy. Hiatal hernia . abnormal gastric mucosa doubt clinical significance status post biopsy   ESOPHAGOGASTRODUODENOSCOPY (EGD) WITH PROPOFOL  N/A 11/07/2017   Dr. Gala Romney: Normal study.     EUS N/A 11/20/2015   Procedure: UPPER ENDOSCOPIC ULTRASOUND (EUS) LINEAR;  Surgeon: Milus Banister, MD;  Location: [facility];  Service: Endoscopy;  Laterality: N/A;   GIVENS CAPSULE STUDY N/A 11/12/2019   Scattered erosions throughout the small bowel.  Ulceration again seen at the anastomosis.  No active bleeding.  Question ischemia   HEMORRHOID SURGERY  12/31/2011   Procedure: HEMORRHOIDECTOMY;  Surgeon: Jamesetta So;  Location: [facility];  Service: General;  Laterality: N/A;   HERNIA REPAIR     umbilical   KIDNEY STONE SURGERY     PILONIDAL CYST / SINUS EXCISION  1976   SKIN LESION EXCISION     left shoulder/pre cancerous   TRANSURETHRAL RESECTION OF PROSTATE N/A 09/09/2016   Procedure: TRANSURETHRAL RESECTION OF THE PROSTATE (TURP);  Surgeon: Irine Seal, MD;  Location: [facility];  Service: Urology;  Laterality: N/A;    Prior to Admission medications   Medication Sig Start Date End Date Taking? Authorizing Provider  ALPRAZolam Duanne Moron) 0.5 MG tablet Take 1 tablet (0.5 mg total) by mouth 2 (two) times daily as needed for anxiety. 05/24/22  Yes Cottle, Billey Co., MD  aspirin EC 81 MG tablet Take 81 mg by mouth daily. Swallow whole.   Yes [provider]  atorvastatin (LIPITOR) 10 MG tablet Take 10 mg by mouth daily. 08/05/20  Yes [provider]  AUVELITY 45-105 MG TBCR TAKE 1 TABLET BY MOUTH TWICE DAILY 09/15/22  Yes Cottle, Billey Co., MD  brimonidine (ALPHAGAN) 0.2 % ophthalmic solution Place 1 drop into both eyes 2 (two) times daily.    Yes [provider]  buPROPion (WELLBUTRIN XL) 150 MG 24 hr tablet Take 1 tablet (150 mg total) by mouth daily. 05/24/22  Yes Cottle, Billey Co., MD  donepezil (ARICEPT) 5 MG tablet TAKE 1 TABLET(5 MG) BY MOUTH AT BEDTIME 07/27/22  Yes Cottle, Billey Co., MD  dorzolamide-timolol (COSOPT) 22.3-6.8 MG/ML ophthalmic solution Place 1 drop into both eyes 2 (two) times daily.   Yes [provider]  fluticasone (FLONASE) 50 MCG/ACT nasal spray Place 2 sprays into both nostrils daily as needed for allergies.  09/21/18  Yes [provider]  latanoprost (XALATAN) 0.005 % ophthalmic solution 1 drop at bedtime. 05/15/22  Yes [provider]  Multiple Vitamin (MULTIVITAMIN) tablet Take 1 tablet by mouth daily.   Yes [provider]  pantoprazole (PROTONIX) 40 MG tablet Take 1 tablet (40 mg total) by mouth 2 (two) times daily before a meal. 07/10/19   Yes Mahala Menghini, PA-C  traZODone (DESYREL) 150 MG tablet TAKE 1 TABLET(150 MG) BY MOUTH AT BEDTIME 09/27/22  Yes Cottle, Billey Co., MD  vitamin B-12 (CYANOCOBALAMIN) 500 MCG tablet Take 500 mcg by mouth every evening.   Yes [provider]    Allergies as of 10/04/2022 - Review Complete 10/04/2022  Allergen Reaction Noted   Ambien [zolpidem tartrate]  09/12/2018   Bactrim [sulfamethoxazole-trimethoprim] Hives and Swelling 09/08/2016   Dilaudid [hydromorphone hcl] Other (See Comments) 09/08/2016   Morphine and related Nausea And Vomiting 09/08/2016    Family History  Problem Relation Age of Onset   Lung cancer Father    Diabetes Mother    Anesthesia problems Neg Hx    Hypotension Neg Hx    Malignant hyperthermia Neg Hx    Pseudochol deficiency Neg Hx    Colon cancer Neg Hx  Social History   Socioeconomic History   Marital status: Married    Spouse name: Not on file   Number of children: 1   Years of education: Not on file   Highest education level: Not on file  Occupational History   Occupation: school system    Employer: [employer]  Tobacco Use   Smoking status: Never   Smokeless tobacco: Never   Tobacco comments:    Never smoked  Vaping Use   Vaping Use: Never used  Substance and Sexual Activity   Alcohol use: No    Alcohol/week: 0.0 standard drinks of alcohol   Drug use: No   Sexual activity: Yes    Birth control/protection: None  Other Topics Concern   Not on file  Social History Narrative   Not on file   Social Determinants of Health   Financial Resource Strain: Not on file  Food Insecurity: Not on file  Transportation Needs: Not on file  Physical Activity: Not on file  Stress: Not on file  Social Connections: Not on file  Intimate Partner Violence: Not on file    Review of Systems: See HPI, otherwise negative ROS  Physical Exam: BP (!) 144/77 (BP Location: Right Arm, Patient Position: Sitting, Cuff Size: Large)    Pulse 76   Temp 97.9 F (36.6 C) (Oral)   Ht '5\' 9"'$  (1.753 m)   Wt 216 lb 12.8 oz (98.3 kg)   SpO2 97%   BMI 32.02 kg/m  General:   Alert,  Well-developed, well-nourished, pleasant and cooperative in NAD Neck:  Supple; no masses or thyromegaly. No significant cervical adenopathy. Lungs:  Clear throughout to auscultation.   No wheezes, crackles, or rhonchi. No acute distress. Heart:  Regular rate and rhythm; no murmurs, clicks, rubs,  or gallops. Abdomen: Non-distended, normal bowel sounds.  Soft and nontender without appreciable mass or hepatosplenomegaly.  Pulses:  Normal pulses noted. Extremities:  Without clubbing or edema. Rectal: No external lesions.  Moderately good resting sphincter tone with adequate voluntary squeeze.  No mass in the rectal vault.  No impaction.  Scant brown stool is Hemoccult negative   Impression/Plan: 71 year old gentleman with a history of stage II colon cancer status post right hemicolectomy in the distant past, hemorrhoidectomy presents with nocturnal fecal seepage.  Otherwise, has normal bowel function 1-2 [place] 3-4 stools daily.  He has essentially negative rectal exam today.  Patient would likely benefit from bolstered fiber intake.  Ideally a psyllium preparation.  Go this route prior to resin binder or antimotility therapy  No need for anorectal manometry at this point in time  Recommendations:  Please begin Metamucil or psyllium fiber supplement 1 dose daily.  May cause transient bloating/gas initially but will go away over time.  The idea here is to bulk up your stool so you evacuate more efficiently/completely with each bowel movement so as to decrease the likelihood of any fecal seepage at night.  Keep a record of any seepage  Office visit with me in 3 months  We will continue to keep plans for repeat colonoscopy in 2025.  Continue Protonix 40 mg once daily before breakfast.  New prescription dispense 90 with 3 refills.  Call me prior to  your next office visit if you are having any problems.       Notice: This dictation was prepared with Dragon dictation along with smaller phrase technology. Any transcriptional errors that result from this process are unintentional and may not be corrected upon review.

## 2022-10-13 DIAGNOSIS — R7989 Other specified abnormal findings of blood chemistry: Secondary | ICD-10-CM | POA: Diagnosis not present

## 2022-10-13 DIAGNOSIS — I10 Essential (primary) hypertension: Secondary | ICD-10-CM | POA: Diagnosis not present

## 2022-10-13 DIAGNOSIS — E039 Hypothyroidism, unspecified: Secondary | ICD-10-CM | POA: Diagnosis not present

## 2022-10-13 DIAGNOSIS — E7849 Other hyperlipidemia: Secondary | ICD-10-CM | POA: Diagnosis not present

## 2022-10-13 DIAGNOSIS — E119 Type 2 diabetes mellitus without complications: Secondary | ICD-10-CM | POA: Diagnosis not present

## 2022-10-13 DIAGNOSIS — R5383 Other fatigue: Secondary | ICD-10-CM | POA: Diagnosis not present

## 2022-10-13 DIAGNOSIS — E538 Deficiency of other specified B group vitamins: Secondary | ICD-10-CM | POA: Diagnosis not present

## 2022-10-20 ENCOUNTER — Encounter: Payer: Self-pay | Admitting: Psychiatry

## 2022-10-20 ENCOUNTER — Telehealth (INDEPENDENT_AMBULATORY_CARE_PROVIDER_SITE_OTHER): Payer: Medicare PPO | Admitting: Psychiatry

## 2022-10-20 DIAGNOSIS — F331 Major depressive disorder, recurrent, moderate: Secondary | ICD-10-CM

## 2022-10-20 DIAGNOSIS — F341 Dysthymic disorder: Secondary | ICD-10-CM | POA: Diagnosis not present

## 2022-10-20 DIAGNOSIS — F411 Generalized anxiety disorder: Secondary | ICD-10-CM | POA: Diagnosis not present

## 2022-10-20 DIAGNOSIS — F902 Attention-deficit hyperactivity disorder, combined type: Secondary | ICD-10-CM

## 2022-10-20 DIAGNOSIS — F5105 Insomnia due to other mental disorder: Secondary | ICD-10-CM | POA: Diagnosis not present

## 2022-10-20 DIAGNOSIS — F39 Unspecified mood [affective] disorder: Secondary | ICD-10-CM

## 2022-10-20 DIAGNOSIS — Z630 Problems in relationship with spouse or partner: Secondary | ICD-10-CM | POA: Diagnosis not present

## 2022-10-20 MED ORDER — DONEPEZIL HCL 10 MG PO TABS: 10.0000 mg | ORAL_TABLET | Freq: Every day | ORAL | 0 refills | Status: DC

## 2022-10-20 MED ORDER — TRAZODONE HCL 150 MG PO TABS: 150.0000 mg | ORAL_TABLET | Freq: Every day | ORAL | 1 refills | Status: DC

## 2022-10-20 MED ORDER — BUPROPION HCL ER (XL) 150 MG PO TB24: 450.0000 mg | ORAL_TABLET | Freq: Every day | ORAL | 0 refills | Status: DC

## 2022-10-20 MED ORDER — ALPRAZOLAM 0.5 MG PO TABS: 0.5000 mg | ORAL_TABLET | Freq: Two times a day (BID) | ORAL | 3 refills | Status: DC | PRN
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
## 2022-10-22 DIAGNOSIS — R030 Elevated blood-pressure reading, without diagnosis of hypertension: Secondary | ICD-10-CM | POA: Diagnosis not present

## 2022-10-22 DIAGNOSIS — J019 Acute sinusitis, unspecified: Secondary | ICD-10-CM | POA: Diagnosis not present

## 2022-10-22 DIAGNOSIS — Z6831 Body mass index (BMI) 31.0-31.9, adult: Secondary | ICD-10-CM | POA: Diagnosis not present

## 2022-10-25 DIAGNOSIS — H4050X0 Glaucoma secondary to other eye disorders, unspecified eye, stage unspecified: Secondary | ICD-10-CM | POA: Diagnosis not present

## 2022-10-25 DIAGNOSIS — R739 Hyperglycemia, unspecified: Secondary | ICD-10-CM | POA: Diagnosis not present

## 2022-10-25 DIAGNOSIS — Z85038 Personal history of other malignant neoplasm of large intestine: Secondary | ICD-10-CM | POA: Diagnosis not present

## 2022-10-25 DIAGNOSIS — N401 Enlarged prostate with lower urinary tract symptoms: Secondary | ICD-10-CM | POA: Diagnosis not present

## 2022-10-25 DIAGNOSIS — Z0001 Encounter for general adult medical examination with abnormal findings: Secondary | ICD-10-CM | POA: Diagnosis not present

## 2022-10-25 DIAGNOSIS — F419 Anxiety disorder, unspecified: Secondary | ICD-10-CM | POA: Diagnosis not present

## 2022-10-25 DIAGNOSIS — E7849 Other hyperlipidemia: Secondary | ICD-10-CM | POA: Diagnosis not present

## 2022-10-25 DIAGNOSIS — I10 Essential (primary) hypertension: Secondary | ICD-10-CM | POA: Diagnosis not present

## 2022-10-25 DIAGNOSIS — G4733 Obstructive sleep apnea (adult) (pediatric): Secondary | ICD-10-CM | POA: Diagnosis not present

## 2022-11-16 DIAGNOSIS — R059 Cough, unspecified: Secondary | ICD-10-CM | POA: Diagnosis not present

## 2022-11-16 DIAGNOSIS — J069 Acute upper respiratory infection, unspecified: Secondary | ICD-10-CM | POA: Diagnosis not present

## 2022-11-16 DIAGNOSIS — R030 Elevated blood-pressure reading, without diagnosis of hypertension: Secondary | ICD-10-CM | POA: Diagnosis not present

## 2022-11-16 DIAGNOSIS — Z20828 Contact with and (suspected) exposure to other viral communicable diseases: Secondary | ICD-10-CM | POA: Diagnosis not present

## 2022-11-18 DIAGNOSIS — M9903 Segmental and somatic dysfunction of lumbar region: Secondary | ICD-10-CM | POA: Diagnosis not present

## 2022-11-18 DIAGNOSIS — M47816 Spondylosis without myelopathy or radiculopathy, lumbar region: Secondary | ICD-10-CM | POA: Diagnosis not present

## 2022-11-18 DIAGNOSIS — M5441 Lumbago with sciatica, right side: Secondary | ICD-10-CM | POA: Diagnosis not present

## 2022-11-23 DIAGNOSIS — M9903 Segmental and somatic dysfunction of lumbar region: Secondary | ICD-10-CM | POA: Diagnosis not present

## 2022-11-23 DIAGNOSIS — M47816 Spondylosis without myelopathy or radiculopathy, lumbar region: Secondary | ICD-10-CM | POA: Diagnosis not present

## 2022-11-23 DIAGNOSIS — M5441 Lumbago with sciatica, right side: Secondary | ICD-10-CM | POA: Diagnosis not present

## 2022-11-24 DIAGNOSIS — H6123 Impacted cerumen, bilateral: Secondary | ICD-10-CM | POA: Diagnosis not present

## 2022-11-30 DIAGNOSIS — R1012 Left upper quadrant pain: Secondary | ICD-10-CM | POA: Diagnosis not present

## 2022-11-30 DIAGNOSIS — Z6832 Body mass index (BMI) 32.0-32.9, adult: Secondary | ICD-10-CM | POA: Diagnosis not present

## 2022-11-30 DIAGNOSIS — R1032 Left lower quadrant pain: Secondary | ICD-10-CM | POA: Diagnosis not present

## 2022-11-30 DIAGNOSIS — R059 Cough, unspecified: Secondary | ICD-10-CM | POA: Diagnosis not present

## 2022-11-30 DIAGNOSIS — R109 Unspecified abdominal pain: Secondary | ICD-10-CM | POA: Diagnosis not present

## 2022-11-30 DIAGNOSIS — Z85038 Personal history of other malignant neoplasm of large intestine: Secondary | ICD-10-CM | POA: Diagnosis not present

## 2022-12-01 DIAGNOSIS — M9903 Segmental and somatic dysfunction of lumbar region: Secondary | ICD-10-CM | POA: Diagnosis not present

## 2022-12-01 DIAGNOSIS — M5441 Lumbago with sciatica, right side: Secondary | ICD-10-CM | POA: Diagnosis not present

## 2022-12-01 DIAGNOSIS — M47816 Spondylosis without myelopathy or radiculopathy, lumbar region: Secondary | ICD-10-CM | POA: Diagnosis not present

## 2022-12-07 DIAGNOSIS — K760 Fatty (change of) liver, not elsewhere classified: Secondary | ICD-10-CM | POA: Diagnosis not present

## 2022-12-07 DIAGNOSIS — M47816 Spondylosis without myelopathy or radiculopathy, lumbar region: Secondary | ICD-10-CM | POA: Diagnosis not present

## 2022-12-07 DIAGNOSIS — M5441 Lumbago with sciatica, right side: Secondary | ICD-10-CM | POA: Diagnosis not present

## 2022-12-07 DIAGNOSIS — N281 Cyst of kidney, acquired: Secondary | ICD-10-CM | POA: Diagnosis not present

## 2022-12-07 DIAGNOSIS — R1032 Left lower quadrant pain: Secondary | ICD-10-CM | POA: Diagnosis not present

## 2022-12-07 DIAGNOSIS — R1012 Left upper quadrant pain: Secondary | ICD-10-CM | POA: Diagnosis not present

## 2022-12-07 DIAGNOSIS — I700 Atherosclerosis of aorta: Secondary | ICD-10-CM | POA: Diagnosis not present

## 2022-12-07 DIAGNOSIS — Z9289 Personal history of other medical treatment: Secondary | ICD-10-CM | POA: Diagnosis not present

## 2022-12-07 DIAGNOSIS — M9903 Segmental and somatic dysfunction of lumbar region: Secondary | ICD-10-CM | POA: Diagnosis not present

## 2022-12-07 DIAGNOSIS — Z85038 Personal history of other malignant neoplasm of large intestine: Secondary | ICD-10-CM | POA: Diagnosis not present

## 2022-12-15 DIAGNOSIS — M9903 Segmental and somatic dysfunction of lumbar region: Secondary | ICD-10-CM | POA: Diagnosis not present

## 2022-12-15 DIAGNOSIS — M47816 Spondylosis without myelopathy or radiculopathy, lumbar region: Secondary | ICD-10-CM | POA: Diagnosis not present

## 2022-12-15 DIAGNOSIS — M5441 Lumbago with sciatica, right side: Secondary | ICD-10-CM | POA: Diagnosis not present

## 2022-12-28 DIAGNOSIS — M5441 Lumbago with sciatica, right side: Secondary | ICD-10-CM | POA: Diagnosis not present

## 2022-12-28 DIAGNOSIS — M47816 Spondylosis without myelopathy or radiculopathy, lumbar region: Secondary | ICD-10-CM | POA: Diagnosis not present

## 2022-12-28 DIAGNOSIS — M9903 Segmental and somatic dysfunction of lumbar region: Secondary | ICD-10-CM | POA: Diagnosis not present

## 2023-01-04 ENCOUNTER — Ambulatory Visit: Payer: Medicare PPO | Admitting: Internal Medicine

## 2023-01-04 DIAGNOSIS — M9903 Segmental and somatic dysfunction of lumbar region: Secondary | ICD-10-CM | POA: Diagnosis not present

## 2023-01-04 DIAGNOSIS — M5441 Lumbago with sciatica, right side: Secondary | ICD-10-CM | POA: Diagnosis not present

## 2023-01-04 DIAGNOSIS — H401131 Primary open-angle glaucoma, bilateral, mild stage: Secondary | ICD-10-CM | POA: Diagnosis not present

## 2023-01-04 DIAGNOSIS — H26493 Other secondary cataract, bilateral: Secondary | ICD-10-CM | POA: Diagnosis not present

## 2023-01-04 DIAGNOSIS — M47816 Spondylosis without myelopathy or radiculopathy, lumbar region: Secondary | ICD-10-CM | POA: Diagnosis not present

## 2023-01-11 DIAGNOSIS — M9903 Segmental and somatic dysfunction of lumbar region: Secondary | ICD-10-CM | POA: Diagnosis not present

## 2023-01-11 DIAGNOSIS — M5441 Lumbago with sciatica, right side: Secondary | ICD-10-CM | POA: Diagnosis not present

## 2023-01-11 DIAGNOSIS — M47816 Spondylosis without myelopathy or radiculopathy, lumbar region: Secondary | ICD-10-CM | POA: Diagnosis not present

## 2023-01-17 ENCOUNTER — Encounter: Payer: Self-pay | Admitting: Psychiatry

## 2023-01-17 ENCOUNTER — Ambulatory Visit: Payer: Medicare PPO | Admitting: Psychiatry

## 2023-01-17 NOTE — Progress Notes (Signed)
Patient could not stay for the appointment today because of the 45-minute wait.  He will reschedule  Lynder Parents, MD, DFAPA

## 2023-01-24 ENCOUNTER — Other Ambulatory Visit: Payer: Self-pay | Admitting: Psychiatry

## 2023-01-24 DIAGNOSIS — F902 Attention-deficit hyperactivity disorder, combined type: Secondary | ICD-10-CM

## 2023-01-24 MED ORDER — DONEPEZIL HCL 10 MG PO TABS: 10.0000 mg | ORAL_TABLET | Freq: Every day | ORAL | 1 refills | Status: DC

## 2023-01-25 ENCOUNTER — Other Ambulatory Visit: Payer: Self-pay | Admitting: Psychiatry

## 2023-01-25 DIAGNOSIS — M5441 Lumbago with sciatica, right side: Secondary | ICD-10-CM | POA: Diagnosis not present

## 2023-01-25 DIAGNOSIS — M9903 Segmental and somatic dysfunction of lumbar region: Secondary | ICD-10-CM | POA: Diagnosis not present

## 2023-01-25 DIAGNOSIS — M47816 Spondylosis without myelopathy or radiculopathy, lumbar region: Secondary | ICD-10-CM | POA: Diagnosis not present

## 2023-01-27 DIAGNOSIS — H6123 Impacted cerumen, bilateral: Secondary | ICD-10-CM | POA: Diagnosis not present

## 2023-01-31 ENCOUNTER — Other Ambulatory Visit: Payer: Medicare PPO

## 2023-01-31 ENCOUNTER — Other Ambulatory Visit: Payer: Self-pay

## 2023-01-31 DIAGNOSIS — N138 Other obstructive and reflux uropathy: Secondary | ICD-10-CM

## 2023-01-31 DIAGNOSIS — X32XXXD Exposure to sunlight, subsequent encounter: Secondary | ICD-10-CM | POA: Diagnosis not present

## 2023-01-31 DIAGNOSIS — L570 Actinic keratosis: Secondary | ICD-10-CM | POA: Diagnosis not present

## 2023-01-31 DIAGNOSIS — N401 Enlarged prostate with lower urinary tract symptoms: Secondary | ICD-10-CM | POA: Diagnosis not present

## 2023-01-31 DIAGNOSIS — L820 Inflamed seborrheic keratosis: Secondary | ICD-10-CM | POA: Diagnosis not present

## 2023-01-31 DIAGNOSIS — D225 Melanocytic nevi of trunk: Secondary | ICD-10-CM | POA: Diagnosis not present

## 2023-01-31 DIAGNOSIS — Z1283 Encounter for screening for malignant neoplasm of skin: Secondary | ICD-10-CM | POA: Diagnosis not present

## 2023-02-02 LAB — PSA, TOTAL AND FREE
PSA, Free Pct: 21.8 %
PSA, Free: 0.24 ng/mL
Prostate Specific Ag, Serum: 1.1 ng/mL (ref 0.0–4.0)

## 2023-02-03 ENCOUNTER — Other Ambulatory Visit: Payer: Medicare PPO

## 2023-02-10 ENCOUNTER — Ambulatory Visit: Payer: Medicare PPO | Admitting: Urology

## 2023-02-10 ENCOUNTER — Encounter: Payer: Self-pay | Admitting: Urology

## 2023-02-10 VITALS — BP 153/84 | HR 66 | Ht 69.0 in | Wt 216.0 lb

## 2023-02-10 DIAGNOSIS — R351 Nocturia: Secondary | ICD-10-CM | POA: Diagnosis not present

## 2023-02-10 DIAGNOSIS — N138 Other obstructive and reflux uropathy: Secondary | ICD-10-CM

## 2023-02-10 DIAGNOSIS — N3943 Post-void dribbling: Secondary | ICD-10-CM

## 2023-02-10 DIAGNOSIS — N401 Enlarged prostate with lower urinary tract symptoms: Secondary | ICD-10-CM

## 2023-02-10 DIAGNOSIS — Z87442 Personal history of urinary calculi: Secondary | ICD-10-CM

## 2023-02-10 LAB — URINALYSIS, ROUTINE W REFLEX MICROSCOPIC
Bilirubin, UA: NEGATIVE
Glucose, UA: NEGATIVE
Ketones, UA: NEGATIVE
Leukocytes,UA: NEGATIVE
Nitrite, UA: NEGATIVE
Protein,UA: NEGATIVE
RBC, UA: NEGATIVE
Specific Gravity, UA: 1.015 (ref 1.005–1.030)
Urobilinogen, Ur: 0.2 mg/dL (ref 0.2–1.0)
pH, UA: 5.5 (ref 5.0–7.5)

## 2023-02-10 NOTE — Progress Notes (Signed)
Subjective:  1. [name]   2. Nocturia   3. Post-void dribbling   4. History of nephrolithiasis      [name] is a 72 year-old male established patient who is here for follow up regarding further evaluation of BPH and lower urinary tract symptoms.  [name].   He had a TURP on 09/09/16. He had some issues with SUI but that resolved with PT/OT and he is doing well.  He has had no dysuria or hematuria.  He had a recent CT AP that showed a Robert left renal cyst and some prostate enlargement.  He has a history of stones but none seen on the recent scan. His UA is clear [name].  He has not had a PSA.  He has no associated signs or symptoms.   02/11/22: [name] for his history of BPH with BOO treated with a TURP in 2017.  He has nocturia x 1.  He  has some increased frequency q3hrs.  He is not sure he empties well but he has a good stream.  His IPSS is 6.   His PSA remains low at 1.2.   He has had no further SUI.  He has had no hematuria or flank pain.  UA is clear [name].    02/10/23: [name] for annual [name] for his history of BPH with LUTS and prior stones.  His PSA remains low at 1.1.  He continues to void well following his TURP in 2017 and he has no stone symptoms.   He has nocturia x 1 and some post void dribbling.  His IPSS is 1.        IPSS     Row Name 02/10/23 1300         International Prostate Symptom Score   How often have you had the sensation of not emptying your bladder? Not at All     How often have you had to urinate less than every two hours? Not at All     How often have you found you stopped and started again several times when you urinated? Not at All     How often have you found it difficult to postpone urination? Not at All     How often have you had a weak urinary stream? Not at All     How often have you had to strain to start urination? Not at All     How many times did  you typically get up at night to urinate? 1 Time     Total IPSS Score 1       Quality of Life due to urinary symptoms   If you were to spend the rest of your life with your urinary condition just the way it is now how would you feel about that? Pleased               ROS:  ROS:  A complete review of systems was performed.  All systems are negative except for pertinent findings as noted.   Review of Systems  Musculoskeletal:  Positive for back pain and joint pain.  Psychiatric/Behavioral:  Positive for depression. The patient is nervous/anxious.   All other systems reviewed and are negative.   Allergies  Allergen Reactions   Ambien [Zolpidem Tartrate]     Sleep walks   Bactrim [Sulfamethoxazole-Trimethoprim] Hives and Swelling   Dilaudid [Hydromorphone Hcl] Other (See Comments)    Made  blood pressure drop with colon resection surgery in 2007    Morphine And Related Nausea And Vomiting    Outpatient Encounter Medications as of 02/10/2023  Medication Sig   ALPRAZolam (XANAX) 0.5 MG tablet Take 1 tablet (0.5 mg total) by mouth 2 (two) times daily as needed for anxiety.   aspirin EC 81 MG tablet Take 81 mg by mouth daily. Swallow whole.   atorvastatin (LIPITOR) 10 MG tablet Take 10 mg by mouth daily.   brimonidine (ALPHAGAN) 0.2 % ophthalmic solution Place 1 drop into both eyes 2 (two) times daily.    buPROPion (WELLBUTRIN XL) 150 MG 24 hr tablet Take 3 tablets (450 mg total) by mouth daily.   donepezil (ARICEPT) 10 MG tablet TAKE 1 TABLET(10 MG) BY MOUTH AT BEDTIME   dorzolamide-timolol (COSOPT) 22.3-6.8 MG/ML ophthalmic solution Place 1 drop into both eyes 2 (two) times daily.   fluticasone (FLONASE) 50 MCG/ACT nasal spray Place 2 sprays into both nostrils daily as needed for allergies.    latanoprost (XALATAN) 0.005 % ophthalmic solution 1 drop at bedtime.   Multiple Vitamin (MULTIVITAMIN) tablet Take 1 tablet by mouth daily.   pantoprazole (PROTONIX) 40 MG tablet Take 1  tablet (40 mg total) by mouth daily.   traZODone (DESYREL) 150 MG tablet Take 1 tablet (150 mg total) by mouth at bedtime.   vitamin B-12 (CYANOCOBALAMIN) 500 MCG tablet Take 500 mcg by mouth every evening.   donepezil (ARICEPT) 10 MG tablet Take 1 tablet (10 mg total) by mouth at bedtime.   No facility-administered encounter medications on file as of 02/10/2023.    Past Medical History:  Diagnosis Date   Anemia    hx of    Arthritis    fingers    BPH (Robert prostatic hyperplasia)    Colon cancer ([place])    stage II, diagnosed [id number]   Complication of anesthesia    vagal response after umbilcal hernia at [facility]   Depression    Diabetes mellitus    no as of 09/08/16 due to weight loss    GERD (gastroesophageal reflux disease)    Glaucoma 2012   H. pylori infection 10/2015   Documented eradication March 2017   History of kidney stones    Sleep apnea    wears CiPaP at night   Staph infection 2008   left side of face    Past Surgical History:  Procedure Laterality Date   AGILE CAPSULE N/A 10/29/2019   Procedure: AGILE CAPSULE;  Surgeon: Daneil Dolin, MD;  Location: [facility];  Service: Endoscopy;  Laterality: N/A;  7:30am   APPENDECTOMY     BIOPSY  10/11/2019   Procedure: BIOPSY;  Surgeon: Daneil Dolin, MD;  Location: [facility];  Service: Endoscopy;;  colon   CARDIAC CATHETERIZATION     COLECTOMY  2007   right hemicolectomy   COLONOSCOPY  05/11/10   normal rectum,normal residual colon with some angry appearring anastomtic mucosa with some erosions and oozing, bx unremarkable   COLONOSCOPY  05/16/2012   Dr. Gala Romney- normal rectum, sigmoid diverticulosis, anastomotic ulcers. bx= acute ulcer and Robert colonic mucosa with intramucosal lympoid aggregates   COLONOSCOPY N/A 11/17/2016   Dr. Gala Romney: Ulcerated mucosa were present at the anastomosis. Robert bx. next TCS in 10/2021.   COLONOSCOPY WITH PROPOFOL N/A 10/11/2019   Dr. Gala Romney: Diverticulosis in the sigmoid colon  and descending colon.  Focal ulcerations distal neoterminal ileum uncertain significance.  Biopsies were Robert with no evidence of malignancy.  Givens capsule completed after colonoscopy.  5-year surveillance colonoscopy advised.   ESOPHAGEAL DILATION N/A 10/28/2015   Procedure: ESOPHAGEAL DILATION;  Surgeon: Daneil Dolin, MD;  [facility]: [facility];  Service: Endoscopy;  Laterality: N/A;   ESOPHAGOGASTRODUODENOSCOPY N/A 10/28/2015   RMR: ulcerated proximal esophageal mass ad described status post biopsy. Abnormal distal esophagus biopsy. status post biopsy. Hiatal hernia . abnormal gastric mucosa doubt clinical significance status post biopsy   ESOPHAGOGASTRODUODENOSCOPY (EGD) WITH PROPOFOL N/A 11/07/2017   Dr. Gala Romney: Normal study.     EUS N/A 11/20/2015   Procedure: UPPER ENDOSCOPIC ULTRASOUND (EUS) LINEAR;  Surgeon: Milus Banister, MD;  Location: [facility];  Service: Endoscopy;  Laterality: N/A;   GIVENS CAPSULE STUDY N/A 11/12/2019   Scattered erosions throughout the small bowel.  Ulceration again seen at the anastomosis.  No active bleeding.  Question ischemia   HEMORRHOID SURGERY  12/31/2011   Procedure: HEMORRHOIDECTOMY;  Surgeon: Jamesetta So;  Location: [facility];  Service: General;  Laterality: N/A;   HERNIA REPAIR     umbilical   KIDNEY STONE SURGERY     PILONIDAL CYST / SINUS EXCISION  1976   SKIN LESION EXCISION     left shoulder/pre cancerous   TRANSURETHRAL RESECTION OF PROSTATE N/A 09/09/2016   Procedure: TRANSURETHRAL RESECTION OF THE PROSTATE (TURP);  Surgeon: Irine Seal, MD;  Location: [facility];  Service: Urology;  Laterality: N/A;    Social History   Socioeconomic History   Marital status: Married    Spouse name: Not on file   Number of children: 1   Years of education: Not on file   Highest education level: Not on file  Occupational History   Occupation: school system    Employer: [employer]  Tobacco Use   Smoking status: Never   Smokeless tobacco:  Never   Tobacco comments:    Never smoked  Vaping Use   Vaping Use: Never used  Substance and Sexual Activity   Alcohol use: No    Alcohol/week: 0.0 standard drinks of alcohol   Drug use: No   Sexual activity: Yes    Birth control/protection: None  Other Topics Concern   Not on file  Social History Narrative   Not on file   Social Determinants of Health   Financial Resource Strain: Not on file  Food Insecurity: Not on file  Transportation Needs: Not on file  Physical Activity: Not on file  Stress: Not on file  Social Connections: Not on file  Intimate Partner Violence: Not on file    Family History  Problem Relation Age of Onset   Lung cancer Father    Diabetes Mother    Anesthesia problems Neg Hx    Hypotension Neg Hx    Malignant hyperthermia Neg Hx    Pseudochol deficiency Neg Hx    Colon cancer Neg Hx        Objective: Vitals:   02/10/23 1325  BP: (!) 153/84  Pulse: 66     Physical Exam Vitals reviewed.  Constitutional:      Appearance: Normal appearance.  Neurological:     Mental Status: He is alert.     Lab Results:  PSA No results found for: "PSA" No results found for: "TESTOSTERONE" Results for orders placed or performed in visit on 02/10/23 (from the past 24 hour(s))  Urinalysis, Routine w reflex microscopic     Status: None   Collection Time: 02/10/23  1:26 PM  Result Value Ref Range  Specific Gravity, UA 1.015 1.005 - 1.030   pH, UA 5.5 5.0 - 7.5   Color, UA Yellow Yellow   Appearance Ur Clear Clear   Leukocytes,UA Negative Negative   Protein,UA Negative Negative/Trace   Glucose, UA Negative Negative   Ketones, UA Negative Negative   RBC, UA Negative Negative   Bilirubin, UA Negative Negative   Urobilinogen, Ur 0.2 0.2 - 1.0 mg/dL   Nitrite, UA Negative Negative   Microscopic Examination Comment    Narrative   Performed at:  [facility] [address] [place], [place]  [id number] Lab Director: [place], Phone:  [phone]     UA is clear.  Lab Results  Component Value Date   PSA1 1.1 01/31/2023   PSA1 1.2 02/04/2022   PSA1 1.4 02/12/2021     Studies/Results:  Assessment & Plan: BPH with BOO doing well s/p TURP with minimal LUTS.   His PSA remains low.  I will just have him return as needed.  He can continue PSA [name] with his PCP.    History of stones.  He has had no symptoms of concern.  No orders of the defined types were placed in this encounter.    Orders Placed This Encounter  Procedures   Urinalysis, Routine w reflex microscopic      Return if symptoms worsen or fail to improve.   CC: Nolon Rod 02/11/2023

## 2023-02-17 ENCOUNTER — Encounter: Payer: Self-pay | Admitting: Radiology

## 2023-02-22 DIAGNOSIS — G4733 Obstructive sleep apnea (adult) (pediatric): Secondary | ICD-10-CM | POA: Diagnosis not present

## 2023-02-25 DIAGNOSIS — H60331 Swimmer's ear, right ear: Secondary | ICD-10-CM | POA: Diagnosis not present

## 2023-03-09 ENCOUNTER — Encounter: Payer: Self-pay | Admitting: Psychiatry

## 2023-03-09 ENCOUNTER — Telehealth (INDEPENDENT_AMBULATORY_CARE_PROVIDER_SITE_OTHER): Payer: Medicare PPO | Admitting: Psychiatry

## 2023-03-09 DIAGNOSIS — F341 Dysthymic disorder: Secondary | ICD-10-CM | POA: Diagnosis not present

## 2023-03-09 DIAGNOSIS — F902 Attention-deficit hyperactivity disorder, combined type: Secondary | ICD-10-CM

## 2023-03-09 DIAGNOSIS — F331 Major depressive disorder, recurrent, moderate: Secondary | ICD-10-CM

## 2023-03-09 DIAGNOSIS — Z630 Problems in relationship with spouse or partner: Secondary | ICD-10-CM | POA: Diagnosis not present

## 2023-03-09 DIAGNOSIS — F411 Generalized anxiety disorder: Secondary | ICD-10-CM | POA: Diagnosis not present

## 2023-03-09 DIAGNOSIS — F5105 Insomnia due to other mental disorder: Secondary | ICD-10-CM | POA: Diagnosis not present
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
## 2023-03-09 NOTE — Patient Instructions (Addendum)
Stop Wellbutrin, start trintellix 5 mg daily for few days then increase to 10 mg 1 daily

## 2023-03-13 ENCOUNTER — Other Ambulatory Visit: Payer: Self-pay | Admitting: Psychiatry

## 2023-03-13 DIAGNOSIS — F411 Generalized anxiety disorder: Secondary | ICD-10-CM

## 2023-03-14 ENCOUNTER — Other Ambulatory Visit: Payer: Self-pay

## 2023-03-14 ENCOUNTER — Telehealth: Payer: Self-pay | Admitting: Psychiatry

## 2023-03-14 DIAGNOSIS — F411 Generalized anxiety disorder: Secondary | ICD-10-CM

## 2023-03-14 NOTE — Telephone Encounter (Signed)
Next visit is 04/25/23. Requesting a refill on Alprazolam 0.5 mg called to:  [facility] [phone] - [place], [address] AT [place]   [phone]

## 2023-03-14 NOTE — Telephone Encounter (Signed)
Pended.

## 2023-03-29 ENCOUNTER — Telehealth: Payer: Self-pay | Admitting: Psychiatry

## 2023-03-29 NOTE — Telephone Encounter (Signed)
Pt lvm @ 10:47a.  He said that the Trintellix is not causing any stomach issues.  He said is making him better than the wellbutrin, so he would like a script sent to [facility] on [address] in [place].   He said he has about 10 pills left.  Next appt 5/6

## 2023-03-31 MED ORDER — VORTIOXETINE HBR 10 MG PO TABS: 10.0000 mg | ORAL_TABLET | Freq: Every day | ORAL | 1 refills | Status: DC

## 2023-03-31 NOTE — Telephone Encounter (Signed)
Sent!

## 2023-04-19 DIAGNOSIS — R0789 Other chest pain: Secondary | ICD-10-CM | POA: Diagnosis not present

## 2023-04-19 DIAGNOSIS — R109 Unspecified abdominal pain: Secondary | ICD-10-CM | POA: Diagnosis not present

## 2023-04-19 DIAGNOSIS — R1032 Left lower quadrant pain: Secondary | ICD-10-CM | POA: Diagnosis not present

## 2023-04-19 DIAGNOSIS — C189 Malignant neoplasm of colon, unspecified: Secondary | ICD-10-CM | POA: Diagnosis not present

## 2023-04-19 DIAGNOSIS — E538 Deficiency of other specified B group vitamins: Secondary | ICD-10-CM | POA: Diagnosis not present

## 2023-04-25 ENCOUNTER — Encounter: Payer: Self-pay | Admitting: Psychiatry

## 2023-04-25 ENCOUNTER — Ambulatory Visit (INDEPENDENT_AMBULATORY_CARE_PROVIDER_SITE_OTHER): Payer: Medicare PPO | Admitting: Psychiatry

## 2023-04-25 DIAGNOSIS — F5105 Insomnia due to other mental disorder: Secondary | ICD-10-CM

## 2023-04-25 DIAGNOSIS — F902 Attention-deficit hyperactivity disorder, combined type: Secondary | ICD-10-CM | POA: Diagnosis not present

## 2023-04-25 DIAGNOSIS — F341 Dysthymic disorder: Secondary | ICD-10-CM | POA: Diagnosis not present

## 2023-04-25 DIAGNOSIS — F411 Generalized anxiety disorder: Secondary | ICD-10-CM | POA: Diagnosis not present

## 2023-04-25 DIAGNOSIS — F331 Major depressive disorder, recurrent, moderate: Secondary | ICD-10-CM | POA: Diagnosis not present

## 2023-04-25 MED ORDER — VORTIOXETINE HBR 10 MG PO TABS: 10.0000 mg | ORAL_TABLET | Freq: Every day | ORAL | 1 refills | Status: DC
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
## 2023-04-26 DIAGNOSIS — E538 Deficiency of other specified B group vitamins: Secondary | ICD-10-CM | POA: Diagnosis not present

## 2023-04-26 DIAGNOSIS — I10 Essential (primary) hypertension: Secondary | ICD-10-CM | POA: Diagnosis not present

## 2023-04-26 DIAGNOSIS — H4050X0 Glaucoma secondary to other eye disorders, unspecified eye, stage unspecified: Secondary | ICD-10-CM | POA: Diagnosis not present

## 2023-04-26 DIAGNOSIS — I700 Atherosclerosis of aorta: Secondary | ICD-10-CM | POA: Diagnosis not present

## 2023-04-26 DIAGNOSIS — R739 Hyperglycemia, unspecified: Secondary | ICD-10-CM | POA: Diagnosis not present

## 2023-04-26 DIAGNOSIS — N401 Enlarged prostate with lower urinary tract symptoms: Secondary | ICD-10-CM | POA: Diagnosis not present

## 2023-04-26 DIAGNOSIS — R7989 Other specified abnormal findings of blood chemistry: Secondary | ICD-10-CM | POA: Diagnosis not present

## 2023-04-26 DIAGNOSIS — N3943 Post-void dribbling: Secondary | ICD-10-CM | POA: Diagnosis not present

## 2023-04-26 DIAGNOSIS — E7849 Other hyperlipidemia: Secondary | ICD-10-CM | POA: Diagnosis not present

## 2023-05-09 DIAGNOSIS — H401131 Primary open-angle glaucoma, bilateral, mild stage: Secondary | ICD-10-CM | POA: Diagnosis not present

## 2023-05-09 DIAGNOSIS — H26493 Other secondary cataract, bilateral: Secondary | ICD-10-CM | POA: Diagnosis not present

## 2023-05-10 ENCOUNTER — Encounter: Payer: Self-pay | Admitting: Internal Medicine

## 2023-05-10 DIAGNOSIS — M47816 Spondylosis without myelopathy or radiculopathy, lumbar region: Secondary | ICD-10-CM | POA: Diagnosis not present

## 2023-05-10 DIAGNOSIS — M5441 Lumbago with sciatica, right side: Secondary | ICD-10-CM | POA: Diagnosis not present

## 2023-05-10 DIAGNOSIS — M9903 Segmental and somatic dysfunction of lumbar region: Secondary | ICD-10-CM | POA: Diagnosis not present

## 2023-05-11 DIAGNOSIS — H6122 Impacted cerumen, left ear: Secondary | ICD-10-CM | POA: Diagnosis not present

## 2023-05-11 DIAGNOSIS — H903 Sensorineural hearing loss, bilateral: Secondary | ICD-10-CM | POA: Diagnosis not present

## 2023-05-11 DIAGNOSIS — H60331 Swimmer's ear, right ear: Secondary | ICD-10-CM | POA: Diagnosis not present

## 2023-05-17 DIAGNOSIS — M9903 Segmental and somatic dysfunction of lumbar region: Secondary | ICD-10-CM | POA: Diagnosis not present

## 2023-05-17 DIAGNOSIS — M5441 Lumbago with sciatica, right side: Secondary | ICD-10-CM | POA: Diagnosis not present

## 2023-05-17 DIAGNOSIS — M47816 Spondylosis without myelopathy or radiculopathy, lumbar region: Secondary | ICD-10-CM | POA: Diagnosis not present

## 2023-05-23 DIAGNOSIS — M9901 Segmental and somatic dysfunction of cervical region: Secondary | ICD-10-CM | POA: Diagnosis not present

## 2023-05-23 DIAGNOSIS — S233XXA Sprain of ligaments of thoracic spine, initial encounter: Secondary | ICD-10-CM | POA: Diagnosis not present

## 2023-05-23 DIAGNOSIS — M9902 Segmental and somatic dysfunction of thoracic region: Secondary | ICD-10-CM | POA: Diagnosis not present

## 2023-05-23 DIAGNOSIS — M5442 Lumbago with sciatica, left side: Secondary | ICD-10-CM | POA: Diagnosis not present

## 2023-05-23 DIAGNOSIS — M47816 Spondylosis without myelopathy or radiculopathy, lumbar region: Secondary | ICD-10-CM | POA: Diagnosis not present

## 2023-05-23 DIAGNOSIS — M9903 Segmental and somatic dysfunction of lumbar region: Secondary | ICD-10-CM | POA: Diagnosis not present

## 2023-05-23 DIAGNOSIS — M47812 Spondylosis without myelopathy or radiculopathy, cervical region: Secondary | ICD-10-CM | POA: Diagnosis not present

## 2023-05-28 ENCOUNTER — Other Ambulatory Visit: Payer: Self-pay | Admitting: Psychiatry

## 2023-05-28 DIAGNOSIS — F5105 Insomnia due to other mental disorder: Secondary | ICD-10-CM

## 2023-06-02 DIAGNOSIS — H9011 Conductive hearing loss, unilateral, right ear, with unrestricted hearing on the contralateral side: Secondary | ICD-10-CM | POA: Diagnosis not present

## 2023-06-02 DIAGNOSIS — H60331 Swimmer's ear, right ear: Secondary | ICD-10-CM | POA: Diagnosis not present

## 2023-06-05 ENCOUNTER — Ambulatory Visit
Admission: EM | Admit: 2023-06-05 | Discharge: 2023-06-05 | Disposition: A | Discharge status: Home / Self Care | Payer: Medicare PPO | Attending: Physician Assistant | Admitting: Physician Assistant

## 2023-06-05 DIAGNOSIS — Z1152 Encounter for screening for COVID-19: Secondary | ICD-10-CM | POA: Insufficient documentation

## 2023-06-05 DIAGNOSIS — R0981 Nasal congestion: Secondary | ICD-10-CM | POA: Insufficient documentation

## 2023-06-05 DIAGNOSIS — J0100 Acute maxillary sinusitis, unspecified: Secondary | ICD-10-CM | POA: Insufficient documentation

## 2023-06-05 MED ORDER — AMOXICILLIN-POT CLAVULANATE 875-125 MG PO TABS: 1.0000 | ORAL_TABLET | Freq: Two times a day (BID) | ORAL | 0 refills | Status: DC

## 2023-06-05 NOTE — ED Provider Notes (Signed)
[facility]    CSN: [id number] Arrival date & time: 06/05/23  0957      History   Chief Complaint No chief complaint on file.   HPI [name] is a 72 y.o. male.   Patient presents today with a 2-week history of URI symptoms.  He reports nasal congestion, scratchy throat, postnasal drainage, cough.  Denies any fever, chest pain, shortness of breath.  He does report some ear fullness.  He was seen by his ENT but not prescribed any medication as exam was normal.  He has been taking over-the-counter allergy medication without improvement of symptoms.  Denies any known sick contacts.  He has taken 2 COVID test that were negative.  He is requesting a COVID test here as he has a 86-year-old son with cerebral palsy that is immunosuppressed and so his wife is requesting confirmatory testing.  We did discuss that this would not change our management and is possible that it could be positive if his symptoms began as COVID even though he would no longer be considered contagious but he still is interested in having testing.  Denies any recent antibiotics or steroids.  He denies history of asthma, COPD, smoking.      Past Medical History:  Diagnosis Date   Anemia    hx of    Arthritis    fingers    BPH (benign prostatic hyperplasia)    Colon cancer (HCC)    stage II, diagnosed 2007   Complication of anesthesia    vagal response after umbilcal hernia at [facility]   Depression    Diabetes mellitus    no as of 09/08/16 due to weight loss    GERD (gastroesophageal reflux disease)    Glaucoma 2012   H. pylori infection 10/2015   Documented eradication March 2017   History of kidney stones    Sleep apnea    wears CiPaP at night   Staph infection 2008   left side of face    Patient Active Problem List   Diagnosis Date Noted   Aortic atherosclerosis (HCC) 10/22/2021   Cough 12/10/2020   History of colon cancer 05/29/2020   Small bowel lesion 05/29/2020   Anastomotic ulcer  05/29/2020   Constipation 08/06/2019   Small bowel obstruction (HCC) 07/31/2019   Glaucoma 07/31/2019   SBO (small bowel obstruction) (HCC) 07/31/2019   OSA (obstructive sleep apnea)    BPH (benign prostatic hyperplasia)    Anxiety 10/04/2018   Post-void dribbling 10/04/2018   d 09/12/2018   Major depressive disorder, recurrent episode (HCC) 09/12/2018   Bipolar disorder, unspecified (HCC) 09/12/2018   Nausea without vomiting 09/19/2017   Rectal pain 10/27/2016   Benign prostatic hyperplasia with urinary obstruction 09/09/2016   Dysphonia 07/01/2016   Laryngopharyngeal reflux (LPR) 07/01/2016   Pharyngeal dysphagia 07/01/2016   Helicobacter pylori gastritis 02/23/2016   SOB (shortness of breath) 12/01/2015   Esophageal mass    Mucosal abnormality of stomach    GERD (gastroesophageal reflux disease) 10/20/2015   Abnormal liver function tests 10/20/2015   Esophageal dysphagia 10/20/2015   Fatty liver 10/20/2015   Lytic lesion of bone on x-ray 12/31/2014   Chest pain 12/21/2012   Type 2 diabetes mellitus (HCC) 12/21/2012   Change in stool 04/25/2012   Musculoskeletal pain 04/20/2012   Strain of hip adductor muscle 04/20/2012   Hip pain 04/20/2012   Psoriasis 01/06/2012   GASTROESOPHAGEAL REFLUX DISEASE, HX OF 04/20/2010   Malignant neoplasm of colon (HCC) 08/14/2009  VITAMIN B12 DEFICIENCY 08/14/2009   OBESITY 08/14/2009    Past Surgical History:  Procedure Laterality Date   AGILE CAPSULE N/A 10/29/2019   Procedure: AGILE CAPSULE;  Surgeon: Corbin Ade, MD;  Location: [facility];  Service: Endoscopy;  Laterality: N/A;  7:30am   APPENDECTOMY     BIOPSY  10/11/2019   Procedure: BIOPSY;  Surgeon: Corbin Ade, MD;  Location: [facility];  Service: Endoscopy;;  colon   CARDIAC CATHETERIZATION     COLECTOMY  2007   right hemicolectomy   COLONOSCOPY  05/11/10   normal rectum,normal residual colon with some angry appearring anastomtic mucosa with some erosions and  oozing, bx unremarkable   COLONOSCOPY  05/16/2012   Dr. Jena Gauss- normal rectum, sigmoid diverticulosis, anastomotic ulcers. bx= acute ulcer and benign colonic mucosa with intramucosal lympoid aggregates   COLONOSCOPY N/A 11/17/2016   Dr. Jena Gauss: Ulcerated mucosa were present at the anastomosis. benign bx. next TCS in 10/2021.   COLONOSCOPY WITH PROPOFOL N/A 10/11/2019   Dr. Jena Gauss: Diverticulosis in the sigmoid colon and descending colon.  Focal ulcerations distal neoterminal ileum uncertain significance.  Biopsies were benign with no evidence of malignancy.  Givens capsule completed after colonoscopy.  5-year surveillance colonoscopy advised.   ESOPHAGEAL DILATION N/A 10/28/2015   Procedure: ESOPHAGEAL DILATION;  Surgeon: Corbin Ade, MD;  [facility]: [facility];  Service: Endoscopy;  Laterality: N/A;   ESOPHAGOGASTRODUODENOSCOPY N/A 10/28/2015   RMR: ulcerated proximal esophageal mass ad described status post biopsy. Abnormal distal esophagus biopsy. status post biopsy. Hiatal hernia . abnormal gastric mucosa doubt clinical significance status post biopsy   ESOPHAGOGASTRODUODENOSCOPY (EGD) WITH PROPOFOL N/A 11/07/2017   Dr. Jena Gauss: Normal study.     EUS N/A 11/20/2015   Procedure: UPPER ENDOSCOPIC ULTRASOUND (EUS) LINEAR;  Surgeon: Rachael Fee, MD;  Location: [facility];  Service: Endoscopy;  Laterality: N/A;   GIVENS CAPSULE STUDY N/A 11/12/2019   Scattered erosions throughout the small bowel.  Ulceration again seen at the anastomosis.  No active bleeding.  Question ischemia   HEMORRHOID SURGERY  12/31/2011   Procedure: HEMORRHOIDECTOMY;  Surgeon: [name];  Location: [facility];  Service: General;  Laterality: N/A;   HERNIA REPAIR     umbilical   KIDNEY STONE SURGERY     PILONIDAL CYST / SINUS EXCISION  1976   SKIN LESION EXCISION     left shoulder/pre cancerous   TRANSURETHRAL RESECTION OF PROSTATE N/A 09/09/2016   Procedure: TRANSURETHRAL RESECTION OF THE PROSTATE (TURP);  Surgeon:  Bjorn Pippin, MD;  Location: [facility];  Service: Urology;  Laterality: N/A;       Home Medications    Prior to Admission medications   Medication Sig Start Date End Date Taking? Authorizing Provider  amoxicillin-clavulanate (AUGMENTIN) 875-125 MG tablet Take 1 tablet by mouth every 12 (twelve) hours. 06/05/23  Yes Henning Ehle, Noberto Retort, PA-C  ALPRAZolam (XANAX) 0.5 MG tablet TAKE 1 TABLET(0.5 MG) BY MOUTH TWICE DAILY AS NEEDED FOR ANXIETY 03/14/23   Cottle, Steva Ready., MD  aspirin EC 81 MG tablet Take 81 mg by mouth daily. Swallow whole.    [provider]  atorvastatin (LIPITOR) 10 MG tablet Take 10 mg by mouth daily. 08/05/20   [provider]  brimonidine (ALPHAGAN) 0.2 % ophthalmic solution Place 1 drop into both eyes 2 (two) times daily.     [provider]  donepezil (ARICEPT) 10 MG tablet TAKE 1 TABLET(10 MG) BY MOUTH AT BEDTIME 01/25/23   Cottle,  Steva Ready., MD  dorzolamide-timolol (COSOPT) 22.3-6.8 MG/ML ophthalmic solution Place 1 drop into both eyes 2 (two) times daily.    [provider]  fluticasone (FLONASE) 50 MCG/ACT nasal spray Place 2 sprays into both nostrils daily as needed for allergies.  09/21/18   [provider]  latanoprost (XALATAN) 0.005 % ophthalmic solution 1 drop at bedtime. 05/15/22   [provider]  Multiple Vitamin (MULTIVITAMIN) tablet Take 1 tablet by mouth daily.    [provider]  pantoprazole (PROTONIX) 40 MG tablet Take 1 tablet (40 mg total) by mouth daily. 10/04/22   Corbin Ade, MD  traZODone (DESYREL) 150 MG tablet TAKE 1 TABLET(150 MG) BY MOUTH AT BEDTIME 05/29/23   Cottle, Steva Ready., MD  vitamin B-12 (CYANOCOBALAMIN) 500 MCG tablet Take 500 mcg by mouth every evening.    [provider]  vortioxetine HBr (TRINTELLIX) 10 MG TABS tablet Take 1 tablet (10 mg total) by mouth daily. 04/25/23   Cottle, Steva Ready., MD    Family History Family History  Problem Relation Age of Onset   Lung  cancer Father    Diabetes Mother    Anesthesia problems Neg Hx    Hypotension Neg Hx    Malignant hyperthermia Neg Hx    Pseudochol deficiency Neg Hx    Colon cancer Neg Hx     Social History Social History   Tobacco Use   Smoking status: Never   Smokeless tobacco: Never   Tobacco comments:    Never smoked  Vaping Use   Vaping Use: Never used  Substance Use Topics   Alcohol use: No    Alcohol/week: 0.0 standard drinks of alcohol   Drug use: No     Allergies   Ambien [zolpidem tartrate], Bactrim [sulfamethoxazole-trimethoprim], Dilaudid [hydromorphone hcl], and Morphine and codeine   Review of Systems Review of Systems  Constitutional:  Positive for activity change. Negative for appetite change, fatigue and fever.  HENT:  Positive for congestion, ear pain (fullness), postnasal drip and sinus pressure. Negative for sneezing and sore throat.   Respiratory:  Positive for cough. Negative for shortness of breath.   Cardiovascular:  Negative for chest pain.  Neurological:  Negative for dizziness, light-headedness and headaches.     Physical Exam Triage Vital Signs ED Triage Vitals  Enc Vitals Group     BP 06/05/23 1013 125/72     Pulse Rate 06/05/23 1013 63     Resp 06/05/23 1013 16     Temp 06/05/23 1013 98 F (36.7 C)     Temp Source 06/05/23 1013 Oral     SpO2 06/05/23 1013 96 %     Weight --      Height --      Head Circumference --      Peak Flow --      Pain Score 06/05/23 1016 0     Pain Loc --      Pain Edu? --      Excl. in GC? --    No data found.  Updated Vital Signs BP 125/72 (BP Location: Right Arm)   Pulse 63   Temp 98 F (36.7 C) (Oral)   Resp 16   SpO2 96%   Visual Acuity Right Eye Distance:   Left Eye Distance:   Bilateral Distance:    Right Eye Near:   Left Eye Near:    Bilateral Near:     Physical Exam Vitals reviewed.  Constitutional:  General: He is awake.     Appearance: Normal appearance. He is well-developed. He  is not ill-appearing.     Comments: Very pleasant male appears stated age no acute distress sitting comfortably in exam room  HENT:     Head: Normocephalic and atraumatic.     Right Ear: Tympanic membrane, ear canal and external ear normal. Tympanic membrane is not erythematous or bulging.     Left Ear: Tympanic membrane, ear canal and external ear normal. Tympanic membrane is not erythematous or bulging.     Nose:     Right Sinus: Maxillary sinus tenderness present. No frontal sinus tenderness.     Left Sinus: Maxillary sinus tenderness present. No frontal sinus tenderness.     Mouth/Throat:     Pharynx: Uvula midline. Posterior oropharyngeal erythema present. No oropharyngeal exudate.     Comments: Significant erythema and drainage in posterior oropharynx Cardiovascular:     Rate and Rhythm: Normal rate and regular rhythm.     Heart sounds: Normal heart sounds, S1 normal and S2 normal. No murmur heard. Pulmonary:     Effort: Pulmonary effort is normal. No accessory muscle usage or respiratory distress.     Breath sounds: Normal breath sounds. No stridor. No wheezing, rhonchi or rales.     Comments: Clear to auscultation bilaterally Neurological:     Mental Status: He is alert.  Psychiatric:        Behavior: Behavior is cooperative.      UC Treatments / Results  Labs (all labs ordered are listed, but only abnormal results are displayed) Labs Reviewed  SARS CORONAVIRUS 2 (TAT 6-24 HRS)    EKG   Radiology No results found.  Procedures Procedures (including critical care time)  Medications Ordered in UC Medications - No data to display  Initial Impression / Assessment and Plan / UC Course  I have reviewed the triage vital signs and the nursing notes.  Pertinent labs & imaging results that were available during my care of the patient were reviewed by me and considered in my medical decision making (see chart for details).     Patient is well-appearing, afebrile,  nontoxic, nontachycardic.  Chest x-ray was deferred as patient had no adventitious lung sounds on exam and oxygen saturation was 96%.  Concern for secondary bacterial infection causing sinus symptoms given prolonged and worsening symptoms.  He was started on Augmentin 875/125 twice daily for 7 days.  No indication for dose adjustment based on creatinine from 12/07/2022 with creatinine of 1.11 and calculated creatinine clearance of 84.59 mL/min.  Encouraged him to use over-the-counter medications for additional symptom relief.  He is to use nasal saline and sinus rinses for additional symptom management.  He requested COVID-19 testing.  We discussed that this would not change our management and there is a possibility that it could be positive if he has recently been infected with COVID even if he is not actively shedding virus.  He expressed understanding but stated that his wife is requesting a PCR COVID test and so this was obtained.  He will monitor his MyChart for results.  Recommend close follow-up with his primary care.  Discussed that if he has any worsening or changing symptoms he needs to be seen immediately.  Strict return precautions given.  All questions answered to patient satisfaction.  Final Clinical Impressions(s) / UC Diagnoses   Final diagnoses:  Nasal congestion  Acute non-recurrent maxillary sinusitis  Encounter for screening for COVID-19     Discharge Instructions  We are treating you for sinus infection.  Please take Augmentin twice daily for 7 days.  Use over-the-counter medication including Mucinex, Flonase, Tylenol for additional symptom relief.  We have tested you for COVID.  Please monitor your MyChart for these results.  As we discussed, it is possible that you could have a positive result even though you are not considered infectious because this is a very sensitive test that looks for virus particles and cannot tell the difference between a live virus and dead virus.   If you have any worsening or changing symptoms please return for reevaluation.  Follow closely with your primary care.     ED Prescriptions     Medication Sig Dispense Auth. Provider   amoxicillin-clavulanate (AUGMENTIN) 875-125 MG tablet Take 1 tablet by mouth every 12 (twelve) hours. 14 tablet Loran Auguste, Noberto Retort, PA-C      PDMP not reviewed this encounter.   Jeani Hawking, PA-C 06/05/23 1034

## 2023-06-05 NOTE — Discharge Instructions (Signed)
We are treating you for sinus infection.  Please take Augmentin twice daily for 7 days.  Use over-the-counter medication including Mucinex, Flonase, Tylenol for additional symptom relief.  We have tested you for COVID.  Please monitor your MyChart for these results.  As we discussed, it is possible that you could have a positive result even though you are not considered infectious because this is a very sensitive test that looks for virus particles and cannot tell the difference between a live virus and dead virus.  If you have any worsening or changing symptoms please return for reevaluation.  Follow closely with your primary care.

## 2023-06-05 NOTE — ED Triage Notes (Signed)
Pt reports he has nasal congestion and throat drainage x 1 week. Wants coivd test due to son being immunosuppressed. Took a few covid test at home but they were negative. Took allergy meds but no relief.  Pts right ear feels clogged. Seen ENT but says nothing is wrong would like it checked.

## 2023-06-08 LAB — SARS CORONAVIRUS 2 (TAT 6-24 HRS): SARS Coronavirus 2: NEGATIVE

## 2023-06-21 ENCOUNTER — Ambulatory Visit: Payer: Medicare PPO | Admitting: Internal Medicine

## 2023-06-21 ENCOUNTER — Encounter: Payer: Self-pay | Admitting: Internal Medicine

## 2023-06-21 VITALS — BP 132/73 | HR 50 | Temp 97.5°F | Ht 68.5 in | Wt 226.2 lb

## 2023-06-21 DIAGNOSIS — K219 Gastro-esophageal reflux disease without esophagitis: Secondary | ICD-10-CM

## 2023-06-21 DIAGNOSIS — R151 Fecal smearing: Secondary | ICD-10-CM | POA: Diagnosis not present

## 2023-06-21 DIAGNOSIS — Z85038 Personal history of other malignant neoplasm of large intestine: Secondary | ICD-10-CM | POA: Diagnosis not present

## 2023-06-21 NOTE — Patient Instructions (Signed)
It was good to see you again today!  Continue Protonix 40 mg daily before breakfast  Continue with Metamucil every day.  You are doing very well with it.  As discussed we will plan to bring you back mid summer next year for a surveillance colonoscopy.  ASA 2  If you have any interim concerns or problems, please do not hesitate to call me

## 2023-06-21 NOTE — Progress Notes (Unsigned)
Primary Care Physician:  Joeseph Amor Primary Gastroenterologist:  Dr. Jena Gauss  Pre-Procedure History & Physical: HPI:  [name] is a 72 y.o. male here for follow-up of GERD and fecal seepage.  stage II colon cancer - status post right hemicolectomy 2007.  Recent sinusitis went to the ED.  GERD well-controlled on Protonix 40 mg once daily takes it before breakfast.  Had some episodes of fecal seepage previously takes Metamucil every day and has great bowel function as he reports at this time.  History of GI bleed secondary to small anastomotic ulcer.  He is due for a surveillance colonoscopy 1 year from now.  Past Medical History:  Diagnosis Date   Anemia    hx of    Arthritis    fingers    BPH (benign prostatic hyperplasia)    Colon cancer (HCC)    stage II, diagnosed 2007   Complication of anesthesia    vagal response after umbilcal hernia at [facility]   Depression    Diabetes mellitus    no as of 09/08/16 due to weight loss    GERD (gastroesophageal reflux disease)    Glaucoma 2012   H. pylori infection 10/2015   Documented eradication March 2017   History of kidney stones    Sleep apnea    wears CiPaP at night   Staph infection 2008   left side of face    Past Surgical History:  Procedure Laterality Date   AGILE CAPSULE N/A 10/29/2019   Procedure: AGILE CAPSULE;  Surgeon: Corbin Ade, MD;  [facility];  Service: Endoscopy;  Laterality: N/A;  7:30am   APPENDECTOMY     BIOPSY  10/11/2019   Procedure: BIOPSY;  Surgeon: Corbin Ade, MD;  [facility];  Service: Endoscopy;;  colon   CARDIAC CATHETERIZATION     COLECTOMY  2007   right hemicolectomy   COLONOSCOPY  05/11/10   normal rectum,normal residual colon with some angry appearring anastomtic mucosa with some erosions and oozing, bx unremarkable   COLONOSCOPY  05/16/2012   Dr. Jena Gauss- normal rectum, sigmoid diverticulosis, anastomotic ulcers. bx= acute ulcer and benign  colonic mucosa with intramucosal lympoid aggregates   COLONOSCOPY N/A 11/17/2016   Dr. Jena Gauss: Ulcerated mucosa were present at the anastomosis. benign bx. next TCS in 10/2021.   COLONOSCOPY WITH PROPOFOL N/A 10/11/2019   Dr. Jena Gauss: Diverticulosis in the sigmoid colon and descending colon.  Focal ulcerations distal neoterminal ileum uncertain significance.  Biopsies were benign with no evidence of malignancy.  Givens capsule completed after colonoscopy.  5-year surveillance colonoscopy advised.   ESOPHAGEAL DILATION N/A 10/28/2015   Procedure: ESOPHAGEAL DILATION;  Surgeon: Corbin Ade, MD;  [facility];  Service: Endoscopy;  Laterality: N/A;   ESOPHAGOGASTRODUODENOSCOPY N/A 10/28/2015   RMR: ulcerated proximal esophageal mass ad described status post biopsy. Abnormal distal esophagus biopsy. status post biopsy. Hiatal hernia . abnormal gastric mucosa doubt clinical significance status post biopsy   ESOPHAGOGASTRODUODENOSCOPY (EGD) WITH PROPOFOL N/A 11/07/2017   Dr. Jena Gauss: Normal study.     EUS N/A 11/20/2015   Procedure: UPPER ENDOSCOPIC ULTRASOUND (EUS) LINEAR;  Surgeon: Rachael Fee, MD;  Location: [facility];  Service: Endoscopy;  Laterality: N/A;   GIVENS CAPSULE STUDY N/A 11/12/2019   Scattered erosions throughout the small bowel.  Ulceration again seen at the anastomosis.  No active bleeding.  Question ischemia   HEMORRHOID SURGERY  12/31/2011   Procedure: HEMORRHOIDECTOMY;  Surgeon: Loraine Leriche  A Jenkins;  Location: [facility];  Service: General;  Laterality: N/A;   HERNIA REPAIR     umbilical   KIDNEY STONE SURGERY     PILONIDAL CYST / SINUS EXCISION  1976   SKIN LESION EXCISION     left shoulder/pre cancerous   TRANSURETHRAL RESECTION OF PROSTATE N/A 09/09/2016   Procedure: TRANSURETHRAL RESECTION OF THE PROSTATE (TURP);  Surgeon: Bjorn Pippin, MD;  Location: [facility];  Service: Urology;  Laterality: N/A;    Prior to Admission medications   Medication Sig Start Date End Date  Taking? Authorizing Provider  ALPRAZolam (XANAX) 0.5 MG tablet TAKE 1 TABLET(0.5 MG) BY MOUTH TWICE DAILY AS NEEDED FOR ANXIETY 03/14/23  Yes Cottle, Steva Ready., MD  aspirin EC 81 MG tablet Take 81 mg by mouth daily. Swallow whole.   Yes [provider]  atorvastatin (LIPITOR) 10 MG tablet Take 10 mg by mouth daily. 08/05/20  Yes [provider]  brimonidine (ALPHAGAN) 0.2 % ophthalmic solution Place 1 drop into both eyes 2 (two) times daily.    Yes [provider]  donepezil (ARICEPT) 10 MG tablet TAKE 1 TABLET(10 MG) BY MOUTH AT BEDTIME 01/25/23  Yes Cottle, Steva Ready., MD  dorzolamide-timolol (COSOPT) 22.3-6.8 MG/ML ophthalmic solution Place 1 drop into both eyes 2 (two) times daily.   Yes [provider]  fluticasone (FLONASE) 50 MCG/ACT nasal spray Place 2 sprays into both nostrils daily as needed for allergies.  09/21/18  Yes [provider]  latanoprost (XALATAN) 0.005 % ophthalmic solution 1 drop at bedtime. 05/15/22  Yes [provider]  Multiple Vitamin (MULTIVITAMIN) tablet Take 1 tablet by mouth daily.   Yes [provider]  pantoprazole (PROTONIX) 40 MG tablet Take 1 tablet (40 mg total) by mouth daily. 10/04/22  Yes Corbin Ade, MD  traZODone (DESYREL) 150 MG tablet TAKE 1 TABLET(150 MG) BY MOUTH AT BEDTIME 05/29/23  Yes Cottle, Steva Ready., MD  vitamin B-12 (CYANOCOBALAMIN) 500 MCG tablet Take 500 mcg by mouth every evening.   Yes [provider]  vortioxetine HBr (TRINTELLIX) 10 MG TABS tablet Take 1 tablet (10 mg total) by mouth daily. 04/25/23  Yes Cottle, Steva Ready., MD    Allergies as of 06/21/2023 - Review Complete 06/21/2023  Allergen Reaction Noted   Ambien [zolpidem tartrate]  09/12/2018   Bactrim [sulfamethoxazole-trimethoprim] Hives and Swelling 09/08/2016   Dilaudid [hydromorphone hcl] Other (See Comments) 09/08/2016   Morphine and codeine Nausea And Vomiting 09/08/2016    Family History  Problem  Relation Age of Onset   Lung cancer Father    Diabetes Mother    Anesthesia problems Neg Hx    Hypotension Neg Hx    Malignant hyperthermia Neg Hx    Pseudochol deficiency Neg Hx    Colon cancer Neg Hx     Social History   Socioeconomic History   Marital status: Married    Spouse name: Not on file   Number of children: 1   Years of education: Not on file   Highest education level: Not on file  Occupational History   Occupation: school system    Employer: [employer]  Tobacco Use   Smoking status: Never   Smokeless tobacco: Never   Tobacco comments:    Never smoked  Vaping Use   Vaping Use: Never used  Substance and Sexual Activity   Alcohol use: No    Alcohol/week: 0.0 standard drinks of alcohol   Drug use: No  Sexual activity: Yes    Birth control/protection: None  Other Topics Concern   Not on file  Social History Narrative   Not on file   Social Determinants of Health   Financial Resource Strain: Not on file  Food Insecurity: Not on file  Transportation Needs: Not on file  Physical Activity: Not on file  Stress: Not on file  Social Connections: Not on file  Intimate Partner Violence: Not on file    Review of Systems: See HPI, otherwise negative ROS  Physical Exam: BP 132/73 (BP Location: Right Arm, Patient Position: Sitting, Cuff Size: Large)   Pulse (!) 50   Temp (!) 97.5 F (36.4 C) (Oral)   Ht 5' 8.5" (1.74 m)   Wt 226 lb 3.2 oz (102.6 kg)   SpO2 95%   BMI 33.89 kg/m  General:   Alert,  Well-developed, well-nourished, pleasant and cooperative in NAD Lungs:  Clear throughout to auscultation.   No wheezes, crackles, or rhonchi. No acute distress. Heart:  Regular rate and rhythm; no murmurs, clicks, rubs,  or gallops. Abdomen: Non-distended, normal bowel sounds.  Soft and nontender without appreciable mass or hepatosplenomegaly.   Impression/Plan: 72 year old gentleman GERD well-controlled on once daily PPI  History of fecal seepage  abolished with daily Metamucil.  History of stage II colorectal cancer  - status post right hemicolectomy 2007; due for surveillance colonoscopy in 1 year.  Recommendations:  Continue Protonix 40 mg daily before breakfast  Continue with Metamucil every day.  You are doing very well with it.  As discussed, surveillance colonoscopy mid summer next year (ASA 3).  If  any interim concerns or problems, patient to call.    Notice: This dictation was prepared with Dragon dictation along with smaller phrase technology. Any transcriptional errors that result from this process are unintentional and may not be corrected upon review.

## 2023-07-16 ENCOUNTER — Other Ambulatory Visit: Payer: Self-pay | Admitting: Psychiatry

## 2023-07-16 DIAGNOSIS — F411 Generalized anxiety disorder: Secondary | ICD-10-CM

## 2023-07-25 ENCOUNTER — Ambulatory Visit (INDEPENDENT_AMBULATORY_CARE_PROVIDER_SITE_OTHER): Payer: Medicare PPO | Admitting: Psychiatry

## 2023-07-25 ENCOUNTER — Encounter: Payer: Self-pay | Admitting: Psychiatry

## 2023-07-25 DIAGNOSIS — F331 Major depressive disorder, recurrent, moderate: Secondary | ICD-10-CM

## 2023-07-25 DIAGNOSIS — F341 Dysthymic disorder: Secondary | ICD-10-CM | POA: Diagnosis not present

## 2023-07-25 DIAGNOSIS — F902 Attention-deficit hyperactivity disorder, combined type: Secondary | ICD-10-CM | POA: Diagnosis not present

## 2023-07-25 DIAGNOSIS — F5105 Insomnia due to other mental disorder: Secondary | ICD-10-CM

## 2023-07-25 DIAGNOSIS — F411 Generalized anxiety disorder: Secondary | ICD-10-CM

## 2023-07-25 MED ORDER — DONEPEZIL HCL 10 MG PO TABS: 10.0000 mg | ORAL_TABLET | Freq: Every day | ORAL | 1 refills | Status: DC

## 2023-07-25 MED ORDER — TRAZODONE HCL 150 MG PO TABS: 150.0000 mg | ORAL_TABLET | Freq: Every day | ORAL | 1 refills | Status: DC

## 2023-07-25 MED ORDER — ALPRAZOLAM 0.5 MG PO TABS
0.5000 mg | ORAL_TABLET | Freq: Three times a day (TID) | ORAL | 1 refills | Status: DC | PRN
Start: 2023-07-25 — End: 2023-11-25

## 2023-07-25 MED ORDER — VORTIOXETINE HBR 20 MG PO TABS
20.0000 mg | ORAL_TABLET | Freq: Every day | ORAL | 2 refills | Status: DC
Start: 2023-07-25 — End: 2023-10-25
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
## 2023-08-01 DIAGNOSIS — X32XXXD Exposure to sunlight, subsequent encounter: Secondary | ICD-10-CM | POA: Diagnosis not present

## 2023-08-01 DIAGNOSIS — L570 Actinic keratosis: Secondary | ICD-10-CM | POA: Diagnosis not present

## 2023-08-10 ENCOUNTER — Telehealth: Payer: Self-pay | Admitting: Psychiatry

## 2023-08-10 NOTE — Telephone Encounter (Signed)
[name] called at 10:09 having to cancel his appt for 8/26. He was coming in because you had him double up on his medication and it messed  his stomach up and he can't tolerate it.  He needs to discuss other options.  Please call.  Appt 11/5

## 2023-08-15 ENCOUNTER — Ambulatory Visit: Payer: Medicare PPO | Admitting: Psychiatry

## 2023-09-01 DIAGNOSIS — H9202 Otalgia, left ear: Secondary | ICD-10-CM | POA: Diagnosis not present

## 2023-09-01 DIAGNOSIS — H6122 Impacted cerumen, left ear: Secondary | ICD-10-CM | POA: Diagnosis not present

## 2023-09-01 DIAGNOSIS — H903 Sensorineural hearing loss, bilateral: Secondary | ICD-10-CM | POA: Diagnosis not present

## 2023-09-09 DIAGNOSIS — H401131 Primary open-angle glaucoma, bilateral, mild stage: Secondary | ICD-10-CM | POA: Diagnosis not present

## 2023-09-12 DIAGNOSIS — N200 Calculus of kidney: Secondary | ICD-10-CM | POA: Insufficient documentation

## 2023-09-12 NOTE — Progress Notes (Unsigned)
Name: [name] DOB: [birth year] MRN: [id number]  History of Present Illness: Mr. [name] is a 72 y.o. male who presents today for return visit at [facility]. - GU history: 1. BPH with LUTS (nocturia, terminal dribbling). - 09/09/2016: Underwent TURP by Dr. Annabell [name]. - PSA has been normal (most recently was 1.1 on 01/31/2023).  2. Kidney stones.   At last visit with Dr. Annabell [name] on 02/10/2023: - Doing well with minimal LUTS and normal PSA.  - The plan was f/u with urology PRN and continue PSA f/u with PCP.  Today: He reports that over the past 4-5 months he has had mild perineal pain which is described as sharp ("like a tack") which is intermittent but present frequently. Denies fevers, bladder pain, flank pain, dysuria, gross hematuria, hesitancy, straining to void, or sensations of incomplete emptying.  At baseline he reports urinary urgency and somewhat weak urinary stream with terminal dribbling. Denies urge or stress incontinence.   Fall Screening: Do you usually have a device to assist in your mobility? No   Medications: Current Outpatient Medications  Medication Sig Dispense Refill   baclofen (LIORESAL) 10 MG tablet Take 1 tablet (10 mg total) by mouth at bedtime. 30 tablet 1   ALPRAZolam (XANAX) 0.5 MG tablet Take 1 tablet (0.5 mg total) by mouth 3 (three) times daily as needed for anxiety. 60 tablet 1   aspirin EC 81 MG tablet Take 81 mg by mouth daily. Swallow whole.     atorvastatin (LIPITOR) 10 MG tablet Take 10 mg by mouth daily.     brimonidine (ALPHAGAN) 0.2 % ophthalmic solution Place 1 drop into both eyes 2 (two) times daily.      donepezil (ARICEPT) 10 MG tablet Take 1 tablet (10 mg total) by mouth at bedtime. 90 tablet 1   dorzolamide-timolol (COSOPT) 22.3-6.8 MG/ML ophthalmic solution Place 1 drop into both eyes 2 (two) times daily.     fluticasone (FLONASE) 50 MCG/ACT nasal spray Place 2 sprays into both nostrils daily as needed for allergies.    2   latanoprost (XALATAN) 0.005 % ophthalmic solution 1 drop at bedtime.     Multiple Vitamin (MULTIVITAMIN) tablet Take 1 tablet by mouth daily.     pantoprazole (PROTONIX) 40 MG tablet Take 1 tablet (40 mg total) by mouth daily. 90 tablet 3   traZODone (DESYREL) 150 MG tablet Take 1 tablet (150 mg total) by mouth at bedtime. 90 tablet 1   vitamin B-12 (CYANOCOBALAMIN) 500 MCG tablet Take 500 mcg by mouth every evening.     vortioxetine HBr (TRINTELLIX) 20 MG TABS tablet Take 1 tablet (20 mg total) by mouth daily. 30 tablet 2   No current facility-administered medications for this visit.    Allergies: Allergies  Allergen Reactions   Ambien [Zolpidem Tartrate]     Sleep walks   Bactrim [Sulfamethoxazole-Trimethoprim] Hives and Swelling   Dilaudid [Hydromorphone Hcl] Other (See Comments)    Made blood pressure drop with colon resection surgery in 2007    Morphine And Codeine Nausea And Vomiting    Past Medical History:  Diagnosis Date   Anemia    hx of    Arthritis    fingers    BPH (benign prostatic hyperplasia)    Colon cancer (HCC)    stage II, diagnosed 2007   Complication of anesthesia    vagal response after umbilcal hernia at [place]   Depression    Diabetes mellitus    no as of  09/08/16 due to weight loss    GERD (gastroesophageal reflux disease)    Glaucoma 2012   H. pylori infection 10/2015   Documented eradication March 2017   History of kidney stones    Sleep apnea    wears CiPaP at night   Staph infection 2008   left side of face   Past Surgical History:  Procedure Laterality Date   AGILE CAPSULE N/A 10/29/2019   Procedure: AGILE CAPSULE;  Surgeon: Corbin Ade, MD;  Location: [facility];  Service: Endoscopy;  Laterality: N/A;  7:30am   APPENDECTOMY     BIOPSY  10/11/2019   Procedure: BIOPSY;  Surgeon: Corbin Ade, MD;  Location: [facility];  Service: Endoscopy;;  colon   CARDIAC CATHETERIZATION     COLECTOMY  2007   right hemicolectomy    COLONOSCOPY  05/11/10   normal rectum,normal residual colon with some angry appearring anastomtic mucosa with some erosions and oozing, bx unremarkable   COLONOSCOPY  05/16/2012   Dr. Jena Gauss- normal rectum, sigmoid diverticulosis, anastomotic ulcers. bx= acute ulcer and benign colonic mucosa with intramucosal lympoid aggregates   COLONOSCOPY N/A 11/17/2016   Dr. Jena Gauss: Ulcerated mucosa were present at the anastomosis. benign bx. next TCS in 10/2021.   COLONOSCOPY WITH PROPOFOL N/A 10/11/2019   Dr. Jena Gauss: Diverticulosis in the sigmoid colon and descending colon.  Focal ulcerations distal neoterminal ileum uncertain significance.  Biopsies were benign with no evidence of malignancy.  Givens capsule completed after colonoscopy.  5-year surveillance colonoscopy advised.   ESOPHAGEAL DILATION N/A 10/28/2015   Procedure: ESOPHAGEAL DILATION;  Surgeon: Corbin Ade, MD;  Location: [facility];  Service: Endoscopy;  Laterality: N/A;   ESOPHAGOGASTRODUODENOSCOPY N/A 10/28/2015   RMR: ulcerated proximal esophageal mass ad described status post biopsy. Abnormal distal esophagus biopsy. status post biopsy. Hiatal hernia . abnormal gastric mucosa doubt clinical significance status post biopsy   ESOPHAGOGASTRODUODENOSCOPY (EGD) WITH PROPOFOL N/A 11/07/2017   Dr. Jena Gauss: Normal study.     EUS N/A 11/20/2015   Procedure: UPPER ENDOSCOPIC ULTRASOUND (EUS) LINEAR;  Surgeon: Rachael Fee, MD;  Location: [facility];  Service: Endoscopy;  Laterality: N/A;   GIVENS CAPSULE STUDY N/A 11/12/2019   Scattered erosions throughout the small bowel.  Ulceration again seen at the anastomosis.  No active bleeding.  Question ischemia   HEMORRHOID SURGERY  12/31/2011   Procedure: HEMORRHOIDECTOMY;  Surgeon: [name];  Location: [facility];  Service: General;  Laterality: N/A;   HERNIA REPAIR     umbilical   KIDNEY STONE SURGERY     PILONIDAL CYST / SINUS EXCISION  1976   SKIN LESION EXCISION     left shoulder/pre  cancerous   TRANSURETHRAL RESECTION OF PROSTATE N/A 09/09/2016   Procedure: TRANSURETHRAL RESECTION OF THE PROSTATE (TURP);  Surgeon: Bjorn Pippin, MD;  Location: [facility];  Service: Urology;  Laterality: N/A;   Family History  Problem Relation Age of Onset   Lung cancer Father    Diabetes Mother    Anesthesia problems Neg Hx    Hypotension Neg Hx    Malignant hyperthermia Neg Hx    Pseudochol deficiency Neg Hx    Colon cancer Neg Hx    Social History   Socioeconomic History   Marital status: Married    Spouse name: Not on file   Number of children: 1   Years of education: Not on file   Highest education level: Not on file  Occupational History   Occupation: school  system    Employer: [employer]  Tobacco Use   Smoking status: Never   Smokeless tobacco: Never   Tobacco comments:    Never smoked  Vaping Use   Vaping status: Never Used  Substance and Sexual Activity   Alcohol use: No    Alcohol/week: 0.0 standard drinks of alcohol   Drug use: No   Sexual activity: Yes    Birth control/protection: None  Other Topics Concern   Not on file  Social History Narrative   Not on file   Social Determinants of Health   Financial Resource Strain: Not on file  Food Insecurity: Not on file  Transportation Needs: Not on file  Physical Activity: Not on file  Stress: Not on file  Social Connections: Not on file  Intimate Partner Violence: Not on file    Review of Systems Constitutional: Patient denies any unintentional weight loss or change in strength lntegumentary: Patient denies any rashes or pruritus Cardiovascular: Patient denies chest pain or syncope Respiratory: Patient denies shortness of breath Gastrointestinal: Patient denies nausea, vomiting, constipation, or diarrhea. Reports fecal urgency. Musculoskeletal: Patient denies muscle cramps or weakness Neurologic: Patient denies convulsions or seizures Psychiatric: Patient denies memory  problems Allergic/Immunologic: Patient denies recent allergic reaction(s) Hematologic/Lymphatic: Patient denies bleeding tendencies Endocrine: Patient denies heat/cold intolerance  GU: As per HPI.  OBJECTIVE Vitals:   09/13/23 1411  BP: (!) 167/74  Pulse: 64  Temp: 98.3 F (36.8 C)   There is no height or weight on file to calculate BMI.  Physical Examination Constitutional: No obvious distress; patient is non-toxic appearing  Cardiovascular: No visible lower extremity edema.  Respiratory: The patient does not have audible wheezing/stridor; respirations do not appear labored  Gastrointestinal: Abdomen non-distended Musculoskeletal: Normal ROM of UEs  Skin: No obvious rashes/open sores  Neurologic: CN 2-12 grossly intact Psychiatric: Answered questions appropriately with normal affect  Hematologic/Lymphatic/Immunologic: No obvious bruises or sites of spontaneous bleeding  Genitourinary: Perineum appears normal with no erythema, warmth, rash, lesions, or tenderness to palpation. Anal sphincter has normal tone. Prostate is approximately 25 grams and is symmetric and non-nodular without palpable masses, tenderness, warmth, or bogginess.  Rectal vault is without lesions or masses. No hemorrhoids.  Chaperone offered for pelvic exam; patient declined.  UA: negative  PVR: 0 ml  ASSESSMENT Benign prostatic hyperplasia with nocturia - Plan: Urinalysis, Routine w reflex microscopic, BLADDER SCAN AMB NON-IMAGING  Kidney stones - Plan: US RENAL  Post-void dribbling  Perineal pain - Plan: baclofen (LIORESAL) 10 MG tablet  History of colon cancer  No acute findings on exam. We agreed to trial Baclofen 10 mg nightly for pelvic floor muscle relaxation as a possible etiology for his perineal pain. He plans to follow up within the next few months for surveillance colonoscopy due to colon cancer history; if pain is not improved at follow up in 6 weeks he may contact that provider to  inquire about scheduling colonoscopy sooner for further evaluation.   Asymptomatic for stones at this time. We reviewed prior relevant imaging and agreed to obtain RUS to assess current stone burden.  Pt verbalized understanding and agreement. All questions were answered.  PLAN Advised the following: 1. RUS. 2. Baclofen 10 mg nightly. 3. Return in about 6 weeks (around 10/25/2023) for UA, PVR, & f/u with Evette Georges NP with RUS prior .  Orders Placed This Encounter  Procedures   US RENAL    Standing Status:   Future    Standing Expiration Date:  09/12/2024    Order Specific Question:   Reason for Exam (SYMPTOM  OR DIAGNOSIS REQUIRED)    Answer:   kidney stone known or suspected    Order Specific Question:   Preferred imaging location?    Answer:   [facility]   Urinalysis, Routine w reflex microscopic   BLADDER SCAN AMB NON-IMAGING   It has been explained that the patient is to follow regularly with their PCP in addition to all other providers involved in their care and to follow instructions provided by these respective offices. Patient advised to contact urology clinic if any urologic-pertaining questions, concerns, new symptoms or problems arise in the interim period.  There are no Patient Instructions on file for this visit.  Electronically signed by:  Donnita Falls, FNP   09/13/23    3:16 PM

## 2023-09-13 ENCOUNTER — Ambulatory Visit: Payer: Medicare PPO | Admitting: Urology

## 2023-09-13 ENCOUNTER — Encounter: Payer: Self-pay | Admitting: Urology

## 2023-09-13 VITALS — BP 167/74 | HR 64 | Temp 98.3°F

## 2023-09-13 DIAGNOSIS — N200 Calculus of kidney: Secondary | ICD-10-CM

## 2023-09-13 DIAGNOSIS — R102 Pelvic and perineal pain: Secondary | ICD-10-CM

## 2023-09-13 DIAGNOSIS — N3943 Post-void dribbling: Secondary | ICD-10-CM | POA: Diagnosis not present

## 2023-09-13 DIAGNOSIS — R351 Nocturia: Secondary | ICD-10-CM | POA: Diagnosis not present

## 2023-09-13 DIAGNOSIS — N401 Enlarged prostate with lower urinary tract symptoms: Secondary | ICD-10-CM | POA: Diagnosis not present

## 2023-09-13 DIAGNOSIS — Z85038 Personal history of other malignant neoplasm of large intestine: Secondary | ICD-10-CM

## 2023-09-13 MED ORDER — BACLOFEN 10 MG PO TABS
10.0000 mg | ORAL_TABLET | Freq: Every evening | ORAL | 1 refills | Status: DC
Start: 2023-09-13 — End: 2023-12-02

## 2023-09-13 NOTE — Progress Notes (Signed)
post void residual=0 ?

## 2023-09-14 LAB — URINALYSIS, ROUTINE W REFLEX MICROSCOPIC
Bilirubin, UA: NEGATIVE
Glucose, UA: NEGATIVE
Ketones, UA: NEGATIVE
Leukocytes,UA: NEGATIVE
Nitrite, UA: NEGATIVE
Protein,UA: NEGATIVE
RBC, UA: NEGATIVE
Specific Gravity, UA: 1.03 (ref 1.005–1.030)
Urobilinogen, Ur: 0.2 mg/dL (ref 0.2–1.0)
pH, UA: 6 (ref 5.0–7.5)

## 2023-09-28 ENCOUNTER — Ambulatory Visit (HOSPITAL_COMMUNITY)
Admission: RE | Admit: 2023-09-28 | Discharge: 2023-09-28 | Disposition: A | Discharge status: Home / Self Care | Payer: Medicare PPO | Source: Ambulatory Visit | Attending: Urology | Admitting: Urology

## 2023-09-28 DIAGNOSIS — N281 Cyst of kidney, acquired: Secondary | ICD-10-CM | POA: Diagnosis not present

## 2023-09-28 DIAGNOSIS — N200 Calculus of kidney: Secondary | ICD-10-CM | POA: Insufficient documentation

## 2023-10-11 ENCOUNTER — Encounter (INDEPENDENT_AMBULATORY_CARE_PROVIDER_SITE_OTHER): Payer: Self-pay

## 2023-10-11 ENCOUNTER — Ambulatory Visit (INDEPENDENT_AMBULATORY_CARE_PROVIDER_SITE_OTHER): Payer: Medicare PPO

## 2023-10-11 VITALS — Ht 69.0 in | Wt 229.0 lb

## 2023-10-11 DIAGNOSIS — H9012 Conductive hearing loss, unilateral, left ear, with unrestricted hearing on the contralateral side: Secondary | ICD-10-CM

## 2023-10-11 DIAGNOSIS — H6122 Impacted cerumen, left ear: Secondary | ICD-10-CM | POA: Diagnosis not present

## 2023-10-13 DIAGNOSIS — H9012 Conductive hearing loss, unilateral, left ear, with unrestricted hearing on the contralateral side: Secondary | ICD-10-CM | POA: Insufficient documentation

## 2023-10-13 DIAGNOSIS — H6122 Impacted cerumen, left ear: Secondary | ICD-10-CM | POA: Insufficient documentation

## 2023-10-13 NOTE — Progress Notes (Signed)
Patient ID: [name], male   DOB: January 07, 1951, 72 y.o.   MRN: [id number]  Cc: Left ear hearing loss  HPI: The patient is a 72 year old male who presents today complaining of decreased left ear hearing for the past week.  The patient has a history of bilateral high-frequency sensorineural hearing loss.  According to the patient, his left ear hearing decreased significantly over the past week.  He denies any otalgia, otorrhea, or vertigo.   Exam: General: Communicates without difficulty, well nourished, no acute distress. Head: Normocephalic, no evidence injury, no tenderness, facial buttresses intact without stepoff. Face/sinus: No tenderness to palpation and percussion. Facial movement is normal and symmetric. Eyes: PERRL, EOMI. No scleral icterus, conjunctivae clear. Neuro: CN II exam reveals vision grossly intact.  No nystagmus at any point of gaze. Ears: Auricles well formed without lesions.  Left ear cerumen impaction.  The right ear canal and tympanic membrane are normal.  Nose: External evaluation reveals normal support and skin without lesions.  Dorsum is intact.  Anterior rhinoscopy reveals congested mucosa over anterior aspect of inferior turbinates and intact septum.  No purulence noted. Oral:  Oral cavity and oropharynx are intact, symmetric, without erythema or edema.  Mucosa is moist without lesions. Neck: Full range of motion without pain.  There is no significant lymphadenopathy.  No masses palpable.  Thyroid bed within normal limits to palpation.  Parotid glands and submandibular glands equal bilaterally without mass.  Trachea is midline. Neuro:  CN 2-12 grossly intact.   Procedure: Left ear cerumen disimpaction Anesthesia: None Description: Under the operating microscope, the cerumen is carefully removed with a combination of cerumen currette, alligator forceps, and suction catheters.  After the cerumen is removed, the TMs are noted to be normal.  No mass, erythema, or lesions. The  patient tolerated the procedure well.   Assessment: 1.  Left ear cerumen impaction.  After the cerumen disimpaction procedure, both tympanic membranes and middle ear spaces are noted to be normal. 2.  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  The patient reports that his hearing returns to baseline level after the disimpaction procedure.  Plan: 1.  Otomicroscopy with left ear cerumen disimpaction. 2.  The physical exam findings are reviewed with the patient. 3.  The patient is instructed not to use Q-tips to clean his ear canals. 4.  The patient will return for reevaluation in 3 months.

## 2023-10-24 DIAGNOSIS — R7301 Impaired fasting glucose: Secondary | ICD-10-CM | POA: Diagnosis not present

## 2023-10-24 DIAGNOSIS — E7849 Other hyperlipidemia: Secondary | ICD-10-CM | POA: Diagnosis not present

## 2023-10-24 DIAGNOSIS — I10 Essential (primary) hypertension: Secondary | ICD-10-CM | POA: Diagnosis not present

## 2023-10-24 DIAGNOSIS — E119 Type 2 diabetes mellitus without complications: Secondary | ICD-10-CM | POA: Diagnosis not present

## 2023-10-24 DIAGNOSIS — E039 Hypothyroidism, unspecified: Secondary | ICD-10-CM | POA: Diagnosis not present

## 2023-10-24 DIAGNOSIS — E538 Deficiency of other specified B group vitamins: Secondary | ICD-10-CM | POA: Diagnosis not present

## 2023-10-24 DIAGNOSIS — Z0001 Encounter for general adult medical examination with abnormal findings: Secondary | ICD-10-CM | POA: Diagnosis not present

## 2023-10-25 ENCOUNTER — Encounter: Payer: Self-pay | Admitting: Psychiatry

## 2023-10-25 ENCOUNTER — Ambulatory Visit: Payer: Medicare PPO | Admitting: Urology

## 2023-10-25 ENCOUNTER — Ambulatory Visit (INDEPENDENT_AMBULATORY_CARE_PROVIDER_SITE_OTHER): Payer: Medicare PPO | Admitting: Psychiatry

## 2023-10-25 DIAGNOSIS — F341 Dysthymic disorder: Secondary | ICD-10-CM

## 2023-10-25 DIAGNOSIS — F331 Major depressive disorder, recurrent, moderate: Secondary | ICD-10-CM | POA: Diagnosis not present

## 2023-10-25 DIAGNOSIS — F902 Attention-deficit hyperactivity disorder, combined type: Secondary | ICD-10-CM

## 2023-10-25 DIAGNOSIS — F411 Generalized anxiety disorder: Secondary | ICD-10-CM | POA: Diagnosis not present

## 2023-10-25 DIAGNOSIS — F5105 Insomnia due to other mental disorder: Secondary | ICD-10-CM

## 2023-10-25 MED ORDER — OLANZAPINE 5 MG PO TABS
5.0000 mg | ORAL_TABLET | Freq: Every day | ORAL | 1 refills | Status: DC
Start: 2023-10-25 — End: 2023-11-25
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
## 2023-10-25 NOTE — Patient Instructions (Signed)
Stop trazodone Take olanzapine 1 tablet 1 and 1/2 hour before bedtime

## 2023-10-26 ENCOUNTER — Telehealth: Payer: Self-pay

## 2023-10-26 NOTE — Telephone Encounter (Signed)
Prior Authorization submitted and approved with [facility] for Olanzapine 5 mg effective through 12/19/2024, PA# [id number]

## 2023-10-31 DIAGNOSIS — G4733 Obstructive sleep apnea (adult) (pediatric): Secondary | ICD-10-CM | POA: Diagnosis not present

## 2023-11-01 DIAGNOSIS — E7849 Other hyperlipidemia: Secondary | ICD-10-CM | POA: Diagnosis not present

## 2023-11-01 DIAGNOSIS — F419 Anxiety disorder, unspecified: Secondary | ICD-10-CM | POA: Diagnosis not present

## 2023-11-01 DIAGNOSIS — R739 Hyperglycemia, unspecified: Secondary | ICD-10-CM | POA: Diagnosis not present

## 2023-11-01 DIAGNOSIS — Z0001 Encounter for general adult medical examination with abnormal findings: Secondary | ICD-10-CM | POA: Diagnosis not present

## 2023-11-01 DIAGNOSIS — N401 Enlarged prostate with lower urinary tract symptoms: Secondary | ICD-10-CM | POA: Diagnosis not present

## 2023-11-01 DIAGNOSIS — G4733 Obstructive sleep apnea (adult) (pediatric): Secondary | ICD-10-CM | POA: Diagnosis not present

## 2023-11-01 DIAGNOSIS — I10 Essential (primary) hypertension: Secondary | ICD-10-CM | POA: Diagnosis not present

## 2023-11-01 DIAGNOSIS — E782 Mixed hyperlipidemia: Secondary | ICD-10-CM | POA: Diagnosis not present

## 2023-11-01 DIAGNOSIS — H4050X0 Glaucoma secondary to other eye disorders, unspecified eye, stage unspecified: Secondary | ICD-10-CM | POA: Diagnosis not present

## 2023-11-01 DIAGNOSIS — Z85038 Personal history of other malignant neoplasm of large intestine: Secondary | ICD-10-CM | POA: Diagnosis not present

## 2023-11-03 DIAGNOSIS — M5442 Lumbago with sciatica, left side: Secondary | ICD-10-CM | POA: Diagnosis not present

## 2023-11-03 DIAGNOSIS — M47816 Spondylosis without myelopathy or radiculopathy, lumbar region: Secondary | ICD-10-CM | POA: Diagnosis not present

## 2023-11-03 DIAGNOSIS — M9901 Segmental and somatic dysfunction of cervical region: Secondary | ICD-10-CM | POA: Diagnosis not present

## 2023-11-03 DIAGNOSIS — M9903 Segmental and somatic dysfunction of lumbar region: Secondary | ICD-10-CM | POA: Diagnosis not present

## 2023-11-03 DIAGNOSIS — M47812 Spondylosis without myelopathy or radiculopathy, cervical region: Secondary | ICD-10-CM | POA: Diagnosis not present

## 2023-11-03 DIAGNOSIS — M9902 Segmental and somatic dysfunction of thoracic region: Secondary | ICD-10-CM | POA: Diagnosis not present

## 2023-11-03 DIAGNOSIS — S233XXA Sprain of ligaments of thoracic spine, initial encounter: Secondary | ICD-10-CM | POA: Diagnosis not present

## 2023-11-07 DIAGNOSIS — M47816 Spondylosis without myelopathy or radiculopathy, lumbar region: Secondary | ICD-10-CM | POA: Diagnosis not present

## 2023-11-07 DIAGNOSIS — S233XXA Sprain of ligaments of thoracic spine, initial encounter: Secondary | ICD-10-CM | POA: Diagnosis not present

## 2023-11-07 DIAGNOSIS — M9901 Segmental and somatic dysfunction of cervical region: Secondary | ICD-10-CM | POA: Diagnosis not present

## 2023-11-07 DIAGNOSIS — M9902 Segmental and somatic dysfunction of thoracic region: Secondary | ICD-10-CM | POA: Diagnosis not present

## 2023-11-07 DIAGNOSIS — M9903 Segmental and somatic dysfunction of lumbar region: Secondary | ICD-10-CM | POA: Diagnosis not present

## 2023-11-07 DIAGNOSIS — M47812 Spondylosis without myelopathy or radiculopathy, cervical region: Secondary | ICD-10-CM | POA: Diagnosis not present

## 2023-11-07 DIAGNOSIS — M5442 Lumbago with sciatica, left side: Secondary | ICD-10-CM | POA: Diagnosis not present

## 2023-11-11 DIAGNOSIS — E039 Hypothyroidism, unspecified: Secondary | ICD-10-CM | POA: Diagnosis not present

## 2023-11-11 DIAGNOSIS — E119 Type 2 diabetes mellitus without complications: Secondary | ICD-10-CM | POA: Diagnosis not present

## 2023-11-11 DIAGNOSIS — Z0001 Encounter for general adult medical examination with abnormal findings: Secondary | ICD-10-CM | POA: Diagnosis not present

## 2023-11-11 DIAGNOSIS — E7849 Other hyperlipidemia: Secondary | ICD-10-CM | POA: Diagnosis not present

## 2023-11-11 DIAGNOSIS — I10 Essential (primary) hypertension: Secondary | ICD-10-CM | POA: Diagnosis not present

## 2023-11-11 DIAGNOSIS — I700 Atherosclerosis of aorta: Secondary | ICD-10-CM | POA: Diagnosis not present

## 2023-11-11 DIAGNOSIS — Z6832 Body mass index (BMI) 32.0-32.9, adult: Secondary | ICD-10-CM | POA: Diagnosis not present

## 2023-11-15 DIAGNOSIS — M47816 Spondylosis without myelopathy or radiculopathy, lumbar region: Secondary | ICD-10-CM | POA: Diagnosis not present

## 2023-11-15 DIAGNOSIS — M9902 Segmental and somatic dysfunction of thoracic region: Secondary | ICD-10-CM | POA: Diagnosis not present

## 2023-11-15 DIAGNOSIS — M9903 Segmental and somatic dysfunction of lumbar region: Secondary | ICD-10-CM | POA: Diagnosis not present

## 2023-11-15 DIAGNOSIS — M9901 Segmental and somatic dysfunction of cervical region: Secondary | ICD-10-CM | POA: Diagnosis not present

## 2023-11-15 DIAGNOSIS — S233XXA Sprain of ligaments of thoracic spine, initial encounter: Secondary | ICD-10-CM | POA: Diagnosis not present

## 2023-11-15 DIAGNOSIS — M5442 Lumbago with sciatica, left side: Secondary | ICD-10-CM | POA: Diagnosis not present

## 2023-11-15 DIAGNOSIS — M47812 Spondylosis without myelopathy or radiculopathy, cervical region: Secondary | ICD-10-CM | POA: Diagnosis not present

## 2023-11-23 DIAGNOSIS — M9902 Segmental and somatic dysfunction of thoracic region: Secondary | ICD-10-CM | POA: Diagnosis not present

## 2023-11-23 DIAGNOSIS — M47816 Spondylosis without myelopathy or radiculopathy, lumbar region: Secondary | ICD-10-CM | POA: Diagnosis not present

## 2023-11-23 DIAGNOSIS — M9901 Segmental and somatic dysfunction of cervical region: Secondary | ICD-10-CM | POA: Diagnosis not present

## 2023-11-23 DIAGNOSIS — S233XXA Sprain of ligaments of thoracic spine, initial encounter: Secondary | ICD-10-CM | POA: Diagnosis not present

## 2023-11-23 DIAGNOSIS — M9903 Segmental and somatic dysfunction of lumbar region: Secondary | ICD-10-CM | POA: Diagnosis not present

## 2023-11-23 DIAGNOSIS — M47812 Spondylosis without myelopathy or radiculopathy, cervical region: Secondary | ICD-10-CM | POA: Diagnosis not present

## 2023-11-23 DIAGNOSIS — M5442 Lumbago with sciatica, left side: Secondary | ICD-10-CM | POA: Diagnosis not present

## 2023-11-25 ENCOUNTER — Encounter: Payer: Self-pay | Admitting: Psychiatry

## 2023-11-25 ENCOUNTER — Ambulatory Visit (INDEPENDENT_AMBULATORY_CARE_PROVIDER_SITE_OTHER): Payer: Medicare PPO | Admitting: Psychiatry

## 2023-11-25 DIAGNOSIS — F411 Generalized anxiety disorder: Secondary | ICD-10-CM

## 2023-11-25 DIAGNOSIS — F5105 Insomnia due to other mental disorder: Secondary | ICD-10-CM | POA: Diagnosis not present

## 2023-11-25 DIAGNOSIS — F331 Major depressive disorder, recurrent, moderate: Secondary | ICD-10-CM

## 2023-11-25 DIAGNOSIS — F902 Attention-deficit hyperactivity disorder, combined type: Secondary | ICD-10-CM

## 2023-11-25 DIAGNOSIS — F341 Dysthymic disorder: Secondary | ICD-10-CM

## 2023-11-25 MED ORDER — OLANZAPINE 10 MG PO TABS
10.0000 mg | ORAL_TABLET | Freq: Every day | ORAL | 0 refills | Status: DC
Start: 2023-11-25 — End: 2024-02-24

## 2023-11-25 MED ORDER — ALPRAZOLAM 0.5 MG PO TABS
0.5000 mg | ORAL_TABLET | Freq: Three times a day (TID) | ORAL | 1 refills | Status: DC | PRN
Start: 2023-11-25 — End: 2024-02-02

## 2023-11-25 MED ORDER — DONEPEZIL HCL 10 MG PO TABS
10.0000 mg | ORAL_TABLET | Freq: Every day | ORAL | 1 refills | Status: DC
Start: 2023-11-25 — End: 2024-01-18
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
## 2023-11-29 ENCOUNTER — Telehealth: Payer: Self-pay | Admitting: Internal Medicine

## 2023-11-29 NOTE — Telephone Encounter (Signed)
Patient left a message that he would like to make an appointment, he is having anal pain.  I left on the message that Dr. Jena Gauss didn't have anything until the first of 2025 but could schedule him with Verlon Au on 12/11 if he preferred to see her.

## 2023-12-02 ENCOUNTER — Encounter (INDEPENDENT_AMBULATORY_CARE_PROVIDER_SITE_OTHER): Payer: Self-pay

## 2023-12-02 ENCOUNTER — Encounter: Payer: Self-pay | Admitting: Gastroenterology

## 2023-12-02 ENCOUNTER — Ambulatory Visit (INDEPENDENT_AMBULATORY_CARE_PROVIDER_SITE_OTHER): Payer: Self-pay | Admitting: Audiology

## 2023-12-02 ENCOUNTER — Ambulatory Visit (INDEPENDENT_AMBULATORY_CARE_PROVIDER_SITE_OTHER): Payer: Medicare PPO | Admitting: Otolaryngology

## 2023-12-02 ENCOUNTER — Ambulatory Visit: Payer: Medicare PPO | Admitting: Gastroenterology

## 2023-12-02 VITALS — Ht 69.0 in | Wt 226.0 lb

## 2023-12-02 VITALS — BP 145/76 | HR 75 | Temp 98.6°F | Ht 69.0 in | Wt 232.4 lb

## 2023-12-02 DIAGNOSIS — K219 Gastro-esophageal reflux disease without esophagitis: Secondary | ICD-10-CM

## 2023-12-02 DIAGNOSIS — K6289 Other specified diseases of anus and rectum: Secondary | ICD-10-CM

## 2023-12-02 DIAGNOSIS — H9202 Otalgia, left ear: Secondary | ICD-10-CM

## 2023-12-02 DIAGNOSIS — H608X2 Other otitis externa, left ear: Secondary | ICD-10-CM

## 2023-12-02 DIAGNOSIS — R1319 Other dysphagia: Secondary | ICD-10-CM

## 2023-12-02 DIAGNOSIS — R1310 Dysphagia, unspecified: Secondary | ICD-10-CM | POA: Diagnosis not present

## 2023-12-02 DIAGNOSIS — Z85038 Personal history of other malignant neoplasm of large intestine: Secondary | ICD-10-CM | POA: Diagnosis not present

## 2023-12-02 DIAGNOSIS — H6122 Impacted cerumen, left ear: Secondary | ICD-10-CM | POA: Diagnosis not present

## 2023-12-02 NOTE — Patient Instructions (Signed)
We will get you scheduled for an upper endoscopy with esophageal stretching and colonoscopy. Please call with any questions or concerns in the meantime! Continue miralax daily to avoid straining and hard stools. Continue pantoprazole 40mg  daily.

## 2023-12-02 NOTE — Progress Notes (Signed)
GI Office Note    Referring Provider: Joeseph Amor Primary Care Physician:  Joeseph Amor  Primary Gastroenterologist: Roetta Sessions, MD   Chief Complaint   Chief Complaint  Patient presents with   Follow-up    Pt is here for intermittent anal pain. Pt is not hurting right now    History of Present Illness   [name] is a 72 y.o. male presenting today for anal pain.  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  History of GI bleed secondary to small anastomotic ulcer in the past.  Due for colonoscopy in 2025.  Labs November 2024: B12 409, CEA 2.  Today: One year history of intermittent rectal pain.  More frequent and alarming.  Initially went to see his urologist.  Prescribed baclofen for pelvic floor muscle relaxation as possible etiology for his perineal pain.  Advised to follow-up with GI to consider earlier colonoscopy if ongoing symptoms.  At times it feels like he sitting on a tack.  Pain can be as very sharp and stabbing.  Sometimes dull and throbbing.  Not necessarily related to bowel movements.  Denies any blood in the stool or melena.  Takes MiraLAX or metamucil daily to keep his bowel movements soft and regular.  He has had some breakthrough heartburn symptoms on pantoprazole daily.  Complains of dysphagia to dry/hard foods, large pills.  Denies any significant abdominal pain.  Occasionally has some left-sided discomfort.     Wt Readings from Last 5 Encounters:  12/02/23 232 lb 6.4 oz (105.4 kg)  10/11/23 229 lb (103.9 kg)  06/21/23 226 lb 3.2 oz (102.6 kg)  02/10/23 216 lb (98 kg)  10/04/22 216 lb 12.8 oz (98.3 kg)   Pertinent GI work-up:    EGD November 2016 with 2 to 3 cm submucosal, hard mass with an 8-9 millimeters area of central ulceration in the proximal esophagus. Salmon-colored epithelium also noted suspicious for Barrett's. No esophagitis. Gastric biopsy with chronic active gastritis with H  pylori, subsequently treated (documented eradication in March 2017), esophagogastric junction biopsy gastric-type mucosa with mixed inflammation and lymphoid follicles. Esophageal mass biopsy benign, it was felt that biopsy may not be sufficient due to the approach.    EUS with Dr. Rob Bunting on 11/20/2015. At this time the EGD findings were entirely normal. EUS findings of the esophagus normal. He had a CT of the chest which was normal. Questionable mass related to GERD, edema resolved after starting PPI therapy?   EGD in November 2018 for nausea, completely normal.   Small bowel obstruction in August 2020.   Colonoscopy October 2020: Diverticulosis in the sigmoid colon and descending colon, focal ulcerations in the distal neoterminal ileum s/p biopsy.  Recommended repeat colonoscopy in 5 years for surveillance.  Pathology revealed focal acute inflammation and granulation tissue consistent with ulcer.  No granulomas, adenomatous change, or malignancy.  Recommended Givens capsule to evaluate small bowel.    Givens capsule 11/12/2019: Scattered erosions throughout the small bowel.  Ulceration again seen at the anastomosis.  No active bleeding.  Suspected possible ischemia.  NSAIDs are possibility but patient denied.  Otherwise, nonspecific.  Could be early inflammatory bowel disease but findings are subtle.  Additionally, there was a questionable lesion found within the small bowel with recommendations to pursue CT for further evaluation.  Otherwise, recommended 41-month follow-up.   CTE 01/10/2020: No concerning lesions identified.  No small bowel  mass, inflammation, obstruction, or stricturing.  Possible small bowel diverticulum without inflammation.  Dr. Jena Gauss also reviewed study.  Stated we may have just simply seen a "incidentaloma" on Givens.  Could consider repeating the scan in 1 year.  No strong case for IBD.   CTA abdomen pelvis with contrast December 2023: No acute intra-abdominal or  intrapelvic abnormality.  Hepatic steatosis.  Medications   Current Outpatient Medications  Medication Sig Dispense Refill   ALPRAZolam (XANAX) 0.5 MG tablet Take 1 tablet (0.5 mg total) by mouth 3 (three) times daily as needed for anxiety. 60 tablet 1   aspirin EC 81 MG tablet Take 81 mg by mouth daily. Swallow whole.     atorvastatin (LIPITOR) 10 MG tablet Take 10 mg by mouth daily.     brimonidine (ALPHAGAN) 0.2 % ophthalmic solution Place 1 drop into both eyes 2 (two) times daily.      donepezil (ARICEPT) 10 MG tablet Take 1 tablet (10 mg total) by mouth at bedtime. 90 tablet 1   dorzolamide-timolol (COSOPT) 22.3-6.8 MG/ML ophthalmic solution Place 1 drop into both eyes 2 (two) times daily.     fluticasone (FLONASE) 50 MCG/ACT nasal spray Place 2 sprays into both nostrils daily as needed for allergies.   2   latanoprost (XALATAN) 0.005 % ophthalmic solution 1 drop at bedtime.     Multiple Vitamin (MULTIVITAMIN) tablet Take 1 tablet by mouth daily.     OLANZapine (ZYPREXA) 10 MG tablet Take 1 tablet (10 mg total) by mouth at bedtime. 90 tablet 0   pantoprazole (PROTONIX) 40 MG tablet Take 1 tablet (40 mg total) by mouth daily. 90 tablet 3   vitamin B-12 (CYANOCOBALAMIN) 500 MCG tablet Take 500 mcg by mouth every evening.     No current facility-administered medications for this visit.    Allergies   Allergies as of 12/02/2023 - Review Complete 12/02/2023  Allergen Reaction Noted   Ambien [zolpidem tartrate]  09/12/2018   Bactrim [sulfamethoxazole-trimethoprim] Hives and Swelling 09/08/2016   Dilaudid [hydromorphone hcl] Other (See Comments) 09/08/2016   Morphine and codeine Nausea And Vomiting 09/08/2016     Past Medical History   Past Medical History:  Diagnosis Date   Anemia    hx of    Arthritis    fingers    BPH (benign prostatic hyperplasia)    Colon cancer (HCC)    stage II, diagnosed 2007   Complication of anesthesia    vagal response after umbilcal hernia at  [facility]   Depression    Diabetes mellitus    no as of 09/08/16 due to weight loss    GERD (gastroesophageal reflux disease)    Glaucoma 2012   H. pylori infection 10/2015   Documented eradication March 2017   History of kidney stones    Sleep apnea    wears CiPaP at night   Staph infection 2008   left side of face    Past Surgical History   Past Surgical History:  Procedure Laterality Date   AGILE CAPSULE N/A 10/29/2019   Procedure: AGILE CAPSULE;  Surgeon: Corbin Ade, MD;  Location: [facility];  Service: Endoscopy;  Laterality: N/A;  7:30am   APPENDECTOMY     BIOPSY  10/11/2019   Procedure: BIOPSY;  Surgeon: Corbin Ade, MD;  Location: [facility];  Service: Endoscopy;;  colon   CARDIAC CATHETERIZATION     COLECTOMY  2007   right hemicolectomy   COLONOSCOPY  05/11/10   normal rectum,normal residual  colon with some angry appearring anastomtic mucosa with some erosions and oozing, bx unremarkable   COLONOSCOPY  05/16/2012   Dr. Jena Gauss- normal rectum, sigmoid diverticulosis, anastomotic ulcers. bx= acute ulcer and benign colonic mucosa with intramucosal lympoid aggregates   COLONOSCOPY N/A 11/17/2016   Dr. Jena Gauss: Ulcerated mucosa were present at the anastomosis. benign bx. next TCS in 10/2021.   COLONOSCOPY WITH PROPOFOL N/A 10/11/2019   Dr. Jena Gauss: Diverticulosis in the sigmoid colon and descending colon.  Focal ulcerations distal neoterminal ileum uncertain significance.  Biopsies were benign with no evidence of malignancy.  Givens capsule completed after colonoscopy.  5-year surveillance colonoscopy advised.   ESOPHAGEAL DILATION N/A 10/28/2015   Procedure: ESOPHAGEAL DILATION;  Surgeon: Corbin Ade, MD;  [facility]: [facility];  Service: Endoscopy;  Laterality: N/A;   ESOPHAGOGASTRODUODENOSCOPY N/A 10/28/2015   RMR: ulcerated proximal esophageal mass ad described status post biopsy. Abnormal distal esophagus biopsy. status post biopsy. Hiatal hernia . abnormal  gastric mucosa doubt clinical significance status post biopsy   ESOPHAGOGASTRODUODENOSCOPY (EGD) WITH PROPOFOL N/A 11/07/2017   Dr. Jena Gauss: Normal study.     EUS N/A 11/20/2015   Procedure: UPPER ENDOSCOPIC ULTRASOUND (EUS) LINEAR;  Surgeon: Rachael Fee, MD;  Location: [facility];  Service: Endoscopy;  Laterality: N/A;   GIVENS CAPSULE STUDY N/A 11/12/2019   Scattered erosions throughout the small bowel.  Ulceration again seen at the anastomosis.  No active bleeding.  Question ischemia   HEMORRHOID SURGERY  12/31/2011   Procedure: HEMORRHOIDECTOMY;  Surgeon: [name];  Location: [facility];  Service: General;  Laterality: N/A;   HERNIA REPAIR     umbilical   KIDNEY STONE SURGERY     PILONIDAL CYST / SINUS EXCISION  1976   SKIN LESION EXCISION     left shoulder/pre cancerous   TRANSURETHRAL RESECTION OF PROSTATE N/A 09/09/2016   Procedure: TRANSURETHRAL RESECTION OF THE PROSTATE (TURP);  Surgeon: Bjorn Pippin, MD;  Location: [facility];  Service: Urology;  Laterality: N/A;    Past Family History   Family History  Problem Relation Age of Onset   Lung cancer Father    Diabetes Mother    Anesthesia problems Neg Hx    Hypotension Neg Hx    Malignant hyperthermia Neg Hx    Pseudochol deficiency Neg Hx    Colon cancer Neg Hx     Past Social History   Social History   Socioeconomic History   Marital status: Married    Spouse name: Not on file   Number of children: 1   Years of education: Not on file   Highest education level: Not on file  Occupational History   Occupation: school system    Employer: [employer]  Tobacco Use   Smoking status: Never   Smokeless tobacco: Never   Tobacco comments:    Never smoked  Vaping Use   Vaping status: Never Used  Substance and Sexual Activity   Alcohol use: No    Alcohol/week: 0.0 standard drinks of alcohol   Drug use: No   Sexual activity: Yes    Birth control/protection: None  Other Topics Concern   Not on file  Social  History Narrative   Not on file   Social Drivers of Health   Financial Resource Strain: Not on file  Food Insecurity: Not on file  Transportation Needs: Not on file  Physical Activity: Not on file  Stress: Not on file  Social Connections: Not on file  Intimate Partner Violence: Not  on file    Review of Systems   General: Negative for anorexia, weight loss, fever, chills, fatigue, weakness. ENT: Negative for hoarseness, difficulty swallowing , nasal congestion. CV: Negative for chest pain, angina, palpitations, dyspnea on exertion, peripheral edema.  Respiratory: Negative for dyspnea at rest, dyspnea on exertion, cough, sputum, wheezing.  GI: See history of present illness. GU:  Negative for dysuria, hematuria, urinary incontinence, urinary frequency, nocturnal urination.  Endo: Negative for unusual weight change.     Physical Exam   BP (!) 145/76   Pulse 75   Temp 98.6 F (37 C)   Ht 5\' 9"  (1.753 m)   Wt 232 lb 6.4 oz (105.4 kg)   BMI 34.32 kg/m    General: Well-nourished, well-developed in no acute distress.  Eyes: No icterus. Mouth: Oropharyngeal mucosa moist and pink , no lesions erythema or exudate. Lungs: Clear to auscultation bilaterally.  Heart: Regular rate and rhythm, no murmurs rubs or gallops.  Abdomen: Bowel sounds are normal, nontender, nondistended, no hepatosplenomegaly or masses,  no abdominal bruits or hernia , no rebound or guarding.  Rectal: deferred till colonoscopy  Extremities: No lower extremity edema. No clubbing or deformities. Neuro: Alert and oriented x 4   Skin: Warm and dry, no jaundice.   Psych: Alert and cooperative, normal mood and affect.  Labs   See hpi  Imaging Studies   No results found.  Assessment/Plan:   GERD/esophageal dysphagia: complains of breakthrough heartburn and solid food dysphagia. May have esophagitis or stricture.  -continue pantoprazole 40mg  daily. -EGD/ED with Dr. Jena Gauss. ASA 2.  I have discussed the  risks, alternatives, benefits with regards to but not limited to the risk of reaction to medication, bleeding, infection, perforation and the patient is agreeable to proceed. Written consent to be obtained.   Rectal pain/history of remote colon cancer: Symptoms could be due to spasms, proctalgia fugax, benign anorectal disease such as hemorrhoids, or malignancy. -move forward with colonoscopy at this time.  ASA 2.  I have discussed the risks, alternatives, benefits with regards to but not limited to the risk of reaction to medication, bleeding, infection, perforation and the patient is agreeable to proceed. Written consent to be obtained.    Leanna Battles. Melvyn Neth, MHS, PA-C [facility]

## 2023-12-04 DIAGNOSIS — H9202 Otalgia, left ear: Secondary | ICD-10-CM | POA: Insufficient documentation

## 2023-12-04 DIAGNOSIS — H608X2 Other otitis externa, left ear: Secondary | ICD-10-CM | POA: Insufficient documentation

## 2023-12-04 NOTE — Progress Notes (Signed)
Patient ID: [name], male   DOB: 1951/10/02, 72 y.o.   MRN: [id number]  CC: Left ear pain  HPI: The patient is a 72 year old male who presents today complaining of left ear pain for the past 2 days.  The patient was previously seen for recurrent left cerumen impaction and chronic otitis externa.  According to the patient, he was doing well until 2 days ago, when he started experiencing left ear pain.  He denies any otorrhea, significant change in his hearing, or vertigo.  He is not on any otologic medication at this time.  Exam: General: Communicates without difficulty, well nourished, no acute distress. Head: Normocephalic, no evidence injury, no tenderness, facial buttresses intact without stepoff. Face/sinus: No tenderness to palpation and percussion. Facial movement is normal and symmetric. Eyes: PERRL, EOMI. No scleral icterus, conjunctivae clear. Neuro: CN II exam reveals vision grossly intact.  No nystagmus at any point of gaze. Ears: Auricles well formed without lesions.  Left ear cerumen impaction.  The right ear canal and tympanic membrane are normal.  Nose: External evaluation reveals normal support and skin without lesions.  Dorsum is intact.  Anterior rhinoscopy reveals congested mucosa over anterior aspect of inferior turbinates and intact septum.  No purulence noted. Oral:  Oral cavity and oropharynx are intact, symmetric, without erythema or edema.  Mucosa is moist without lesions. Neck: Full range of motion without pain.  There is no significant lymphadenopathy.  No masses palpable.  Thyroid bed within normal limits to palpation.  Parotid glands and submandibular glands equal bilaterally without mass.  Trachea is midline. Neuro:  CN 2-12 grossly intact.   Procedure: Left ear cerumen disimpaction Anesthesia: None Description: Under the operating microscope, the cerumen is carefully removed with a combination of cerumen currette, alligator forceps, and suction catheters.  After the  cerumen is removed, the left ear canal and tympanic membrane noted to be edematous and erythematous.  No mass, erythema, or lesions. The patient tolerated the procedure well.   Assessment: 1.  Left ear cerumen impaction.  After the disimpaction procedure, the left ear canal and tympanic membrane are noted to be edematous and erythematous. 2.  The right ear canal and tympanic membrane are normal.  Plan: 1.  Otomicroscopy with left ear cerumen disimpaction. 2.  The physical exam findings are reviewed with the patient. 3.  Ciprodex eardrops 4 drops left ear twice daily for 1 week. 4.  Dry ear precautions on the left side. 5.  The patient will return for reevaluation in 2 months, sooner if needed.

## 2023-12-05 ENCOUNTER — Telehealth: Payer: Self-pay | Admitting: *Deleted

## 2023-12-05 ENCOUNTER — Telehealth (INDEPENDENT_AMBULATORY_CARE_PROVIDER_SITE_OTHER): Payer: Self-pay | Admitting: Otolaryngology

## 2023-12-05 MED ORDER — PEG 3350-KCL-NA BICARB-NACL 420 G PO SOLR
4000.0000 mL | Freq: Once | ORAL | 0 refills | Status: AC
Start: 2023-12-05 — End: 2023-12-05

## 2023-12-05 NOTE — Telephone Encounter (Signed)
Patient called in regarding prescription you wrote last week.  Tried to get filled and pharmacy would not fill it due to missing information on prescription slip.  Patient uses [facility] on [address] in [place], [place].  Please call patient.

## 2023-12-05 NOTE — Telephone Encounter (Signed)
LMOVM to call back to schedule TCS/EGD/ED with Dr. Jena Gauss, ASA 2

## 2023-12-05 NOTE — Telephone Encounter (Signed)
Spoke with pt. He has been scheduled for 1/9. Aware will send instructions to him. Rx to be sent to pharmacy. Insurance will be same 2025.   PA approved via [employer]here Authorization #[id number]  DOS:  12/29/2023 - 02/26/2024

## 2023-12-12 ENCOUNTER — Telehealth: Payer: Self-pay | Admitting: *Deleted

## 2023-12-12 ENCOUNTER — Encounter: Payer: Self-pay | Admitting: *Deleted

## 2023-12-12 DIAGNOSIS — R739 Hyperglycemia, unspecified: Secondary | ICD-10-CM | POA: Diagnosis not present

## 2023-12-12 DIAGNOSIS — R030 Elevated blood-pressure reading, without diagnosis of hypertension: Secondary | ICD-10-CM | POA: Diagnosis not present

## 2023-12-12 DIAGNOSIS — E119 Type 2 diabetes mellitus without complications: Secondary | ICD-10-CM | POA: Diagnosis not present

## 2023-12-12 DIAGNOSIS — I10 Essential (primary) hypertension: Secondary | ICD-10-CM | POA: Diagnosis not present

## 2023-12-12 DIAGNOSIS — R5383 Other fatigue: Secondary | ICD-10-CM | POA: Diagnosis not present

## 2023-12-12 DIAGNOSIS — Z6832 Body mass index (BMI) 32.0-32.9, adult: Secondary | ICD-10-CM | POA: Diagnosis not present

## 2023-12-12 NOTE — Telephone Encounter (Signed)
Pt states he was started on Metformin once at night. He has an upcoming procedure on 12/29/23.   Advised pt that he will hold the Metformin the night prior to procedure. Pt verbalized understanding.

## 2023-12-22 ENCOUNTER — Other Ambulatory Visit: Payer: Self-pay | Admitting: Psychiatry

## 2023-12-22 DIAGNOSIS — F411 Generalized anxiety disorder: Secondary | ICD-10-CM

## 2023-12-22 DIAGNOSIS — F341 Dysthymic disorder: Secondary | ICD-10-CM

## 2023-12-22 DIAGNOSIS — F331 Major depressive disorder, recurrent, moderate: Secondary | ICD-10-CM

## 2023-12-22 DIAGNOSIS — F5105 Insomnia due to other mental disorder: Secondary | ICD-10-CM

## 2023-12-23 ENCOUNTER — Ambulatory Visit: Payer: Medicare PPO | Admitting: Psychiatry

## 2023-12-23 ENCOUNTER — Encounter: Payer: Self-pay | Admitting: Psychiatry

## 2023-12-23 DIAGNOSIS — F331 Major depressive disorder, recurrent, moderate: Secondary | ICD-10-CM | POA: Diagnosis not present

## 2023-12-23 DIAGNOSIS — F341 Dysthymic disorder: Secondary | ICD-10-CM

## 2023-12-23 DIAGNOSIS — F902 Attention-deficit hyperactivity disorder, combined type: Secondary | ICD-10-CM | POA: Diagnosis not present

## 2023-12-23 DIAGNOSIS — F5105 Insomnia due to other mental disorder: Secondary | ICD-10-CM | POA: Diagnosis not present

## 2023-12-23 DIAGNOSIS — F411 Generalized anxiety disorder: Secondary | ICD-10-CM | POA: Diagnosis not present
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
## 2023-12-29 ENCOUNTER — Other Ambulatory Visit: Payer: Self-pay

## 2023-12-29 ENCOUNTER — Ambulatory Visit (HOSPITAL_COMMUNITY)
Admission: RE | Admit: 2023-12-29 | Discharge: 2023-12-29 | Disposition: A | Discharge status: Home / Self Care | Payer: Medicare PPO | Attending: Internal Medicine | Admitting: Internal Medicine

## 2023-12-29 ENCOUNTER — Ambulatory Visit (HOSPITAL_COMMUNITY): Payer: Medicare PPO | Admitting: Anesthesiology

## 2023-12-29 ENCOUNTER — Ambulatory Visit (HOSPITAL_BASED_OUTPATIENT_CLINIC_OR_DEPARTMENT_OTHER): Payer: Medicare PPO | Admitting: Anesthesiology

## 2023-12-29 ENCOUNTER — Telehealth: Payer: Self-pay

## 2023-12-29 ENCOUNTER — Encounter (HOSPITAL_COMMUNITY): Payer: Self-pay | Admitting: Internal Medicine

## 2023-12-29 ENCOUNTER — Encounter (HOSPITAL_COMMUNITY)
Admission: RE | Disposition: A | Discharge status: Home / Self Care | Payer: Self-pay | Source: Home / Self Care | Attending: Internal Medicine

## 2023-12-29 DIAGNOSIS — Z800 Family history of malignant neoplasm of digestive organs: Secondary | ICD-10-CM | POA: Diagnosis not present

## 2023-12-29 DIAGNOSIS — K289 Gastrojejunal ulcer, unspecified as acute or chronic, without hemorrhage or perforation: Secondary | ICD-10-CM | POA: Diagnosis not present

## 2023-12-29 DIAGNOSIS — K3189 Other diseases of stomach and duodenum: Secondary | ICD-10-CM

## 2023-12-29 DIAGNOSIS — Z980 Intestinal bypass and anastomosis status: Secondary | ICD-10-CM | POA: Diagnosis not present

## 2023-12-29 DIAGNOSIS — Z9049 Acquired absence of other specified parts of digestive tract: Secondary | ICD-10-CM | POA: Diagnosis not present

## 2023-12-29 DIAGNOSIS — K2980 Duodenitis without bleeding: Secondary | ICD-10-CM | POA: Diagnosis not present

## 2023-12-29 DIAGNOSIS — K219 Gastro-esophageal reflux disease without esophagitis: Secondary | ICD-10-CM | POA: Diagnosis not present

## 2023-12-29 DIAGNOSIS — K6389 Other specified diseases of intestine: Secondary | ICD-10-CM | POA: Diagnosis not present

## 2023-12-29 DIAGNOSIS — I10 Essential (primary) hypertension: Secondary | ICD-10-CM | POA: Diagnosis not present

## 2023-12-29 DIAGNOSIS — K6289 Other specified diseases of anus and rectum: Secondary | ICD-10-CM | POA: Diagnosis not present

## 2023-12-29 DIAGNOSIS — G4730 Sleep apnea, unspecified: Secondary | ICD-10-CM | POA: Insufficient documentation

## 2023-12-29 DIAGNOSIS — K5790 Diverticulosis of intestine, part unspecified, without perforation or abscess without bleeding: Secondary | ICD-10-CM

## 2023-12-29 DIAGNOSIS — K5730 Diverticulosis of large intestine without perforation or abscess without bleeding: Secondary | ICD-10-CM | POA: Insufficient documentation

## 2023-12-29 DIAGNOSIS — E119 Type 2 diabetes mellitus without complications: Secondary | ICD-10-CM | POA: Diagnosis not present

## 2023-12-29 DIAGNOSIS — R1310 Dysphagia, unspecified: Secondary | ICD-10-CM | POA: Diagnosis not present

## 2023-12-29 DIAGNOSIS — Z85038 Personal history of other malignant neoplasm of large intestine: Secondary | ICD-10-CM | POA: Insufficient documentation

## 2023-12-29 HISTORY — PX: MALONEY DILATION: SHX5535

## 2023-12-29 HISTORY — PX: BIOPSY: SHX5522

## 2023-12-29 HISTORY — PX: ESOPHAGOGASTRODUODENOSCOPY (EGD) WITH PROPOFOL: SHX5813

## 2023-12-29 HISTORY — PX: COLONOSCOPY WITH PROPOFOL: SHX5780

## 2023-12-29 LAB — GLUCOSE, CAPILLARY: Glucose-Capillary: 126 mg/dL — ABNORMAL HIGH (ref 70–99)

## 2023-12-29 SURGERY — COLONOSCOPY WITH PROPOFOL
Anesthesia: General

## 2023-12-29 MED ORDER — LIDOCAINE HCL (PF) 2 % IJ SOLN
INTRAMUSCULAR | Status: DC | PRN
  Administered 2023-12-29: 100 mg via INTRADERMAL

## 2023-12-29 MED ORDER — GLYCOPYRROLATE PF 0.2 MG/ML IJ SOSY
PREFILLED_SYRINGE | INTRAMUSCULAR | Status: AC
Start: 2023-12-29 — End: ?
  Filled 2023-12-29: qty 1

## 2023-12-29 MED ORDER — PROPOFOL 10 MG/ML IV BOLUS
INTRAVENOUS | Status: AC
Start: 2023-12-29 — End: ?
  Filled 2023-12-29: qty 20

## 2023-12-29 MED ORDER — GLYCOPYRROLATE PF 0.2 MG/ML IJ SOSY
PREFILLED_SYRINGE | INTRAMUSCULAR | Status: DC | PRN
  Administered 2023-12-29: .2 mg via INTRAVENOUS

## 2023-12-29 MED ORDER — PROPOFOL 500 MG/50ML IV EMUL
INTRAVENOUS | Status: DC | PRN
  Administered 2023-12-29: 50 mg via INTRAVENOUS
  Administered 2023-12-29: 150 ug/kg/min via INTRAVENOUS
  Administered 2023-12-29: 30 mg via INTRAVENOUS

## 2023-12-29 MED ORDER — LACTATED RINGERS IV SOLN: INTRAVENOUS | Status: DC

## 2023-12-29 MED ORDER — PANTOPRAZOLE SODIUM 40 MG PO TBEC: 40.0000 mg | DELAYED_RELEASE_TABLET | Freq: Two times a day (BID) | ORAL | 11 refills | Status: DC

## 2023-12-29 NOTE — Op Note (Signed)
 [facility] Patient Name: [name] Procedure Date: 12/29/2023 12:04 PM MRN: [id number] Date of Birth: 07-Mar-1951 Attending MD: Lamar Ozell Hollingshead , MD, [id number] CSN: [id number] Age: 73 Admit Type: Outpatient Procedure:                Colonoscopy Indications:              High risk colon cancer surveillance: Personal                            history of colon cancer; recent proctalgia?"improved Providers:                Lamar Ozell Hollingshead, MD, Jon LABOR. Gerome RN, RN,                            Daphne Mulch Technician, Pensions Consultant Referring MD:              Medicines:                Propofol  per Anesthesia Complications:            No immediate complications. Estimated Blood Loss:     Estimated blood loss was minimal. Estimated blood                            loss was minimal. Procedure:                Pre-Anesthesia Assessment:                           - Prior to the procedure, a History and Physical                            was performed, and patient medications and                            allergies were reviewed. The patient's tolerance of                            previous anesthesia was also reviewed. The risks                            and benefits of the procedure and the sedation                            options and risks were discussed with the patient.                            All questions were answered, and informed consent                            was obtained. Prior Anticoagulants: The patient has                            taken no anticoagulant or antiplatelet agents. ASA  Grade Assessment: II - A patient with mild systemic                            disease. After reviewing the risks and benefits,                            the patient was deemed in satisfactory condition to                            undergo the procedure.                           After obtaining informed consent, the colonoscope                             was passed under direct vision. Throughout the                            procedure, the patient's blood pressure, pulse, and                            oxygen saturations were monitored continuously. The                            [id number]) scope was introduced through the                            anus and advanced to the the ileocolonic                            anastomosis. The colonoscopy was performed without                            difficulty. The patient tolerated the procedure                            well. The quality of the bowel preparation was                            adequate. The rectum was photographed. Scope In: 12:34:59 PM Scope Out: 12:44:17 PM Scope Withdrawal Time: 0 hours 6 minutes 25 seconds  Total Procedure Duration: 0 hours 9 minutes 18 seconds  Findings:      The perianal and digital rectal examinations were normal.      Scattered medium-mouthed diverticula were found in the sigmoid colon and       descending colon.      Ileocolonic anastomosis identified. Again, patient had friability and       ulceration localized to the colonic side of the anastomosis. Please see       photos. This appeared to be benign. This area was biopsied.      The exam was otherwise without abnormality on direct and retroflexion       views. Impression:               - Diverticulosis in the sigmoid colon and in the  descending colon.                           -Prior right hemicolectomy. Chronic anastomotic                            ulcer. Status post biopsy. Normal-appearing rectum. Moderate Sedation:      Moderate (conscious) sedation was personally administered by an       anesthesia professional. The following parameters were monitored: oxygen       saturation, heart rate, blood pressure, respiratory rate, EKG, adequacy       of pulmonary ventilation, and response to care. Recommendation:           - Patient has a contact  number available for                            emergencies. The signs and symptoms of potential                            delayed complications were discussed with the                            patient. Return to normal activities tomorrow.                            Written discharge instructions were provided to the                            patient.                           - Advance diet as tolerated.                           - Continue present medications.                           - Return to my office in 4 months. See EGD report. Procedure Code(s):        --- Professional ---                           [phone], Colonoscopy, flexible; diagnostic, including                            collection of specimen(s) by brushing or washing,                            when performed (separate procedure) Diagnosis Code(s):        --- Professional ---                           S14.961, Personal history of other malignant                            neoplasm of large intestine  K57.30, Diverticulosis of large intestine without                            perforation or abscess without bleeding CPT copyright 2022 American [employer] Association. All rights reserved. The codes documented in this report are preliminary and upon coder review may  be revised to meet current compliance requirements. Lamar HERO. Whit Bruni, MD Lamar Ozell Hollingshead, MD 12/29/2023 1:43:40 PM This report has been signed electronically. Number of Addenda: 0

## 2023-12-29 NOTE — OR Nursing (Signed)
 At anastomosis at 1237

## 2023-12-29 NOTE — Interval H&P Note (Signed)
 History and Physical Interval Note:  12/29/2023 12:06 PM  [name]  has presented today for surgery, with the diagnosis of rectal pain, history colon cancer, gerd, dysphagia.  The various methods of treatment have been discussed with the patient and family. After consideration of risks, benefits and other options for treatment, the patient has consented to  Procedure(s) with comments: COLONOSCOPY WITH PROPOFOL  (N/A) - 1215pm, asa 2 ESOPHAGOGASTRODUODENOSCOPY (EGD) WITH PROPOFOL  (N/A) MALONEY DILATION (N/A) as a surgical intervention.  The patient's history has been reviewed, patient examined, no change in status, stable for surgery.  I have reviewed the patient's chart and labs.  Questions were answered to the patient's satisfaction.     Carney Saxton    no change.  Rectal pain has improved.  Intermittent esophageal dysphagia reflux controlled on PPI once daily.  EGD with possible esophageal dilation as well as a surveillance colonoscopy per plan. The risks, benefits, limitations, imponderables and alternatives regarding both EGD and colonoscopy have been reviewed with the patient. Questions have been answered. All parties agreeable.

## 2023-12-29 NOTE — Op Note (Signed)
 [facility] Patient Name: [name] Procedure Date: 12/29/2023 12:04 PM MRN: [id number] Date of Birth: 28-Oct-1951 Attending MD: Lamar Ozell Hollingshead , MD, [id number] CSN: [id number] Age: 73 Admit Type: Outpatient Procedure:                Upper GI endoscopy Indications:              Dysphagia Providers:                Lamar Ozell Hollingshead, MD, Jon LABOR. Gerome RN, RN,                            Daphne Mulch Technician, Pensions Consultant Referring MD:              Medicines:                Propofol  per Anesthesia Complications:            No immediate complications. Estimated Blood Loss:     Estimated blood loss was minimal. Procedure:                Pre-Anesthesia Assessment:                           - Prior to the procedure, a History and Physical                            was performed, and patient medications and                            allergies were reviewed. The patient's tolerance of                            previous anesthesia was also reviewed. The risks                            and benefits of the procedure and the sedation                            options and risks were discussed with the patient.                            All questions were answered, and informed consent                            was obtained. Prior Anticoagulants: The patient has                            taken no anticoagulant or antiplatelet agents. ASA                            Grade Assessment: II - A patient with mild systemic                            disease. After reviewing the risks and benefits,  the patient was deemed in satisfactory condition to                            undergo the procedure.                           After obtaining informed consent, the endoscope was                            passed under direct vision. Throughout the                            procedure, the patient's blood pressure, pulse, and                            oxygen  saturations were monitored continuously. The                            [id number]-[id number] ([id number]) scope was introduced through the                            mouth, and advanced to the second part of duodenum.                            The upper GI endoscopy was accomplished without                            difficulty. The patient tolerated the procedure                            well. Scope In: 12:20:55 PM Scope Out: 12:29:35 PM Total Procedure Duration: 0 hours 8 minutes 40 seconds  Findings:      The entire examined stomach was normal.      The examined esophagus was normal. The scope was withdrawn. Dilation was       performed with a Maloney dilator with mild resistance at 56 Fr. The       dilation site was examined following endoscope reinsertion and showed no       change. Estimated blood loss: none.      Examination of the bulb and second portion revealed a sessile       adenomatous appearing polypoid lesion approximately 1.5 cm wide. Please       see photos. This lesion was found at the junction of the bulb and second       portion. This was biopsied following esophageal dilation. Impression:               - Normal stomach.                           - Normal esophagus. Dilated.                           -Duodenal nodule as described above?"status post                            biopsy Moderate  Sedation:      Moderate (conscious) sedation was personally administered by an       anesthesia professional. The following parameters were monitored: oxygen       saturation, heart rate, blood pressure, respiratory rate, EKG, adequacy       of pulmonary ventilation, and response to care. Recommendation:           - Patient has a contact number available for                            emergencies. The signs and symptoms of potential                            delayed complications were discussed with the                            patient. Return to normal activities tomorrow.                             Written discharge instructions were provided to the                            patient.                           - Advance diet as tolerated.                           - Return to my office in 4 months. Follow-up on                            pathology. Procedure Code(s):        --- Professional ---                           [phone], Esophagogastroduodenoscopy, flexible,                            transoral; diagnostic, including collection of                            specimen(s) by brushing or washing, when performed                            (separate procedure)                           43450, Dilation of esophagus, by unguided sound or                            bougie, single or multiple passes Diagnosis Code(s):        --- Professional ---                           R13.10, Dysphagia, unspecified CPT copyright 2022 American Medical Association. All rights reserved. The codes documented in this report are preliminary and upon coder review may  be  revised to meet current compliance requirements. Lamar HERO. Nahima Ales, MD Lamar Ozell Hollingshead, MD 12/29/2023 1:37:49 PM This report has been signed electronically. Number of Addenda: 0

## 2023-12-29 NOTE — Telephone Encounter (Signed)
-----   Message from Eula Listen sent at 12/29/2023 12:54 PM EST -----  new prescription Protonix 40 mg pill dispense 60 with 11 refills take (1)40 mg pill 30 minutes before breakfast and supper every day.  Thanks.

## 2023-12-29 NOTE — Discharge Instructions (Addendum)
  Colonoscopy Discharge Instructions  Read the instructions outlined below and refer to this sheet in the next few weeks. These discharge instructions provide you with general information on caring for yourself after you leave the hospital. Your doctor may also give you specific instructions. While your treatment has been planned according to the most current medical practices available, unavoidable complications occasionally occur. If you have any problems or questions after discharge, call Dr. Shaaron at [phone]. ACTIVITY You may resume your regular activity, but move at a slower pace for the next 24 hours.  Take frequent rest periods for the next 24 hours.  Walking will help get rid of the air and reduce the bloated feeling in your belly (abdomen).  No driving for 24 hours (because of the medicine (anesthesia) used during the test).   Do not sign any important legal documents or operate any machinery for 24 hours (because of the anesthesia used during the test).  NUTRITION Drink plenty of fluids.  You may resume your normal diet as instructed by your doctor.  Begin with a light meal and progress to your normal diet. Heavy or fried foods are harder to digest and may make you feel sick to your stomach (nauseated).  Avoid alcoholic beverages for 24 hours or as instructed.  MEDICATIONS You may resume your normal medications unless your doctor tells you otherwise.  WHAT YOU CAN EXPECT TODAY Some feelings of bloating in the abdomen.  Passage of more gas than usual.  Spotting of blood in your stool or on the toilet paper.  IF YOU HAD POLYPS REMOVED DURING THE COLONOSCOPY: No aspirin  products for 7 days or as instructed.  No alcohol  for 7 days or as instructed.  Eat a soft diet for the next 24 hours.  FINDING OUT THE RESULTS OF YOUR TEST Not all test results are available during your visit. If your test results are not back during the visit, make an appointment with your caregiver to find out the  results. Do not assume everything is normal if you have not heard from your caregiver or the medical facility. It is important for you to follow up on all of your test results.  SEEK IMMEDIATE MEDICAL ATTENTION IF: You have more than a spotting of blood in your stool.  Your belly is swollen (abdominal distention).  You are nauseated or vomiting.  You have a temperature over 101.  You have abdominal pain or discomfort that is severe or gets worse throughout the day.        Diverticulosis found in your colon.  You continue to have an area of benign appearing inflammation where your colon is connected to your small intestine.  Biopsies taken.    Otherwise, your colon and rectum look fine   you have a nodule in your duodenum (small intestine) found on upper endoscopy your esophagus appeared normal.  Your esophagus was dilated.  The nodule in your duodenum was biopsied   increase Protonix  to 40 mg twice daily best taken 30 minutes before breakfast and supper.  A new prescription has been sent to your pharmacy through the office  Further recommendations to follow pending review of biopsy report  Office visit with us  in 4 months.  Office will notify you.  At patient request, I called Sonny at [phone] -  reviewed findings and recommendations  Diverticulosis and colon polyp and

## 2023-12-29 NOTE — Anesthesia Preprocedure Evaluation (Signed)
 Anesthesia Evaluation  Patient identified by MRN, date of birth, ID band Patient awake    Reviewed: Allergy & Precautions, H&P , NPO status , Patient's Chart, lab work & pertinent test results, reviewed documented beta blocker date and time   History of Anesthesia Complications (+) history of anesthetic complications  Airway Mallampati: II  TM Distance: >3 FB Neck ROM: full    Dental no notable dental hx.    Pulmonary neg pulmonary ROS, sleep apnea    Pulmonary exam normal breath sounds clear to auscultation       Cardiovascular Exercise Tolerance: Good hypertension, negative cardio ROS  Rhythm:regular Rate:Normal     Neuro/Psych  PSYCHIATRIC DISORDERS Anxiety Depression Bipolar Disorder    Neuromuscular disease negative neurological ROS  negative psych ROS   GI/Hepatic negative GI ROS, Neg liver ROS,GERD  ,,  Endo/Other  negative endocrine ROSdiabetes    Renal/GU Renal diseasenegative Renal ROS  negative genitourinary   Musculoskeletal   Abdominal   Peds  Hematology negative hematology ROS (+) Blood dyscrasia, anemia   Anesthesia Other Findings   Reproductive/Obstetrics negative OB ROS                             Anesthesia Physical Anesthesia Plan  ASA: 2  Anesthesia Plan: General   Post-op Pain Management:    Induction:   PONV Risk Score and Plan: Propofol  infusion  Airway Management Planned:   Additional Equipment:   Intra-op Plan:   Post-operative Plan:   Informed Consent: I have reviewed the patients History and Physical, chart, labs and discussed the procedure including the risks, benefits and alternatives for the proposed anesthesia with the patient or authorized representative who has indicated his/her understanding and acceptance.     Dental Advisory Given  Plan Discussed with: CRNA  Anesthesia Plan Comments:        Anesthesia Quick Evaluation

## 2023-12-29 NOTE — Transfer of Care (Signed)
 Immediate Anesthesia Transfer of Care Note  Patient: [name]  Procedure(s) Performed: COLONOSCOPY WITH PROPOFOL  ESOPHAGOGASTRODUODENOSCOPY (EGD) WITH PROPOFOL  MALONEY DILATION BIOPSY  Patient Location: Endoscopy Unit  Anesthesia Type:General  Level of Consciousness: drowsy  Airway & Oxygen Therapy: Patient Spontanous Breathing  Post-op Assessment: Report given to RN and Post -op Vital signs reviewed and stable  Post vital signs: Reviewed and stable  Last Vitals:  Vitals Value Taken Time  BP 90/57 12/29/23 1248  Temp 36.5 C 12/29/23 1248  Pulse 65 12/29/23 1248  Resp 13 12/29/23 1248  SpO2 94 % 12/29/23 1248    Last Pain:  Vitals:   12/29/23 1248  TempSrc: Oral  PainSc: 0-No pain      Patients Stated Pain Goal: 5 (12/29/23 1058)  Complications: No notable events documented.

## 2023-12-29 NOTE — Anesthesia Procedure Notes (Signed)
 Date/Time: 12/29/2023 12:27 PM  Performed by: Eliodoro Deward FALCON, CRNAPre-anesthesia Checklist: Patient identified, Emergency Drugs available, Suction available and Patient being monitored Patient Re-evaluated:Patient Re-evaluated prior to induction Oxygen Delivery Method: Nasal cannula Induction Type: IV induction Placement Confirmation: positive ETCO2

## 2023-12-30 ENCOUNTER — Encounter (HOSPITAL_COMMUNITY): Payer: Self-pay | Admitting: Internal Medicine

## 2023-12-30 LAB — SURGICAL PATHOLOGY

## 2023-12-30 NOTE — Anesthesia Postprocedure Evaluation (Signed)
 Anesthesia Post Note  Patient: [name]  Procedure(s) Performed: COLONOSCOPY WITH PROPOFOL  ESOPHAGOGASTRODUODENOSCOPY (EGD) WITH PROPOFOL  MALONEY DILATION BIOPSY  Patient location during evaluation: Phase II Anesthesia Type: General Level of consciousness: awake Pain management: pain level controlled Vital Signs Assessment: post-procedure vital signs reviewed and stable Respiratory status: spontaneous breathing and respiratory function stable Cardiovascular status: blood pressure returned to baseline and stable Postop Assessment: no headache and no apparent nausea or vomiting Anesthetic complications: no Comments: Late entry   No notable events documented.   Last Vitals:  Vitals:   12/29/23 1248 12/29/23 1251  BP: (!) 90/57 (!) 96/55  Pulse: 65   Resp: 13   Temp: 36.5 C   SpO2: 94%     Last Pain:  Vitals:   12/29/23 1248  TempSrc: Oral  PainSc: 0-No pain                 Yvonna JINNY Bosworth

## 2024-01-03 ENCOUNTER — Encounter: Payer: Self-pay | Admitting: Internal Medicine

## 2024-01-09 DIAGNOSIS — H26493 Other secondary cataract, bilateral: Secondary | ICD-10-CM | POA: Diagnosis not present

## 2024-01-09 DIAGNOSIS — H401131 Primary open-angle glaucoma, bilateral, mild stage: Secondary | ICD-10-CM | POA: Diagnosis not present

## 2024-01-16 DIAGNOSIS — R739 Hyperglycemia, unspecified: Secondary | ICD-10-CM | POA: Diagnosis not present

## 2024-01-18 ENCOUNTER — Other Ambulatory Visit: Payer: Self-pay | Admitting: Psychiatry

## 2024-01-18 DIAGNOSIS — F5105 Insomnia due to other mental disorder: Secondary | ICD-10-CM

## 2024-01-25 ENCOUNTER — Ambulatory Visit: Payer: Medicare PPO | Admitting: Psychiatry

## 2024-01-27 ENCOUNTER — Telehealth: Payer: Self-pay | Admitting: Psychiatry

## 2024-01-27 NOTE — Telephone Encounter (Signed)
 Patient notified. He said he would stay on the olanzapine  for now and try mag citrate.

## 2024-01-27 NOTE — Telephone Encounter (Signed)
 Patient reports extreme constipation with olanzapine . He said he is taking Miralax and psyillum without benefit. I asked him if he felt he was getting any benefit and he said he probably is getting some, but doesn't think it is enough to deal with the constipation. I told him his wife reported at last visit that he had improvement. He said sometimes she thinks he has had and sometimes she doesn't.    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 His wife thinks he's further in control of reactivity with higher dose.  He's watching BS.

## 2024-01-27 NOTE — Telephone Encounter (Signed)
 Patient lvm stating since taken Alprazolam  he has encountered extreme constipation. Please advise.  Appointment 02/22/24

## 2024-01-27 NOTE — Telephone Encounter (Signed)
 If he believes the olanzapine  is helpful we can probably figure out a solution to the constipation.  However if it is not markedly helpful, which is apparently what he is suggesting now, then he can just stop it.  There is no withdrawal.  To give him more immediate relief from the constipation, in addition to stool softeners, he could use a bottle of magnesium  citrate that the pharmacy sells.

## 2024-01-30 DIAGNOSIS — L570 Actinic keratosis: Secondary | ICD-10-CM | POA: Diagnosis not present

## 2024-01-30 DIAGNOSIS — Z1283 Encounter for screening for malignant neoplasm of skin: Secondary | ICD-10-CM | POA: Diagnosis not present

## 2024-01-30 DIAGNOSIS — X32XXXD Exposure to sunlight, subsequent encounter: Secondary | ICD-10-CM | POA: Diagnosis not present

## 2024-01-30 DIAGNOSIS — D225 Melanocytic nevi of trunk: Secondary | ICD-10-CM | POA: Diagnosis not present

## 2024-02-01 ENCOUNTER — Other Ambulatory Visit: Payer: Self-pay | Admitting: Psychiatry

## 2024-02-01 DIAGNOSIS — F411 Generalized anxiety disorder: Secondary | ICD-10-CM

## 2024-02-02 ENCOUNTER — Telehealth (INDEPENDENT_AMBULATORY_CARE_PROVIDER_SITE_OTHER): Payer: Self-pay | Admitting: Otolaryngology

## 2024-02-02 NOTE — Telephone Encounter (Signed)
LVM to confirm appt & location [phone] afm

## 2024-02-02 NOTE — Telephone Encounter (Signed)
LF 1/10 LV 1/3

## 2024-02-03 ENCOUNTER — Ambulatory Visit (INDEPENDENT_AMBULATORY_CARE_PROVIDER_SITE_OTHER): Payer: Medicare PPO

## 2024-02-07 DIAGNOSIS — R5383 Other fatigue: Secondary | ICD-10-CM | POA: Diagnosis not present

## 2024-02-07 DIAGNOSIS — E7849 Other hyperlipidemia: Secondary | ICD-10-CM | POA: Diagnosis not present

## 2024-02-07 DIAGNOSIS — E119 Type 2 diabetes mellitus without complications: Secondary | ICD-10-CM | POA: Diagnosis not present

## 2024-02-07 DIAGNOSIS — I10 Essential (primary) hypertension: Secondary | ICD-10-CM | POA: Diagnosis not present

## 2024-02-07 DIAGNOSIS — E782 Mixed hyperlipidemia: Secondary | ICD-10-CM | POA: Diagnosis not present

## 2024-02-14 ENCOUNTER — Ambulatory Visit: Payer: Medicare PPO | Admitting: Gastroenterology

## 2024-02-14 ENCOUNTER — Encounter: Payer: Self-pay | Admitting: Gastroenterology

## 2024-02-14 VITALS — BP 125/74 | HR 49 | Temp 97.9°F | Ht 69.0 in | Wt 211.6 lb

## 2024-02-14 DIAGNOSIS — R7989 Other specified abnormal findings of blood chemistry: Secondary | ICD-10-CM

## 2024-02-14 DIAGNOSIS — K5900 Constipation, unspecified: Secondary | ICD-10-CM | POA: Diagnosis not present

## 2024-02-14 DIAGNOSIS — K219 Gastro-esophageal reflux disease without esophagitis: Secondary | ICD-10-CM | POA: Diagnosis not present

## 2024-02-14 NOTE — Progress Notes (Signed)
 GI Office Note    Referring Provider: Joeseph Amor Primary Care Physician:  Joeseph Amor  Primary Gastroenterologist: Roetta Sessions, MD   Chief Complaint   Chief Complaint  Patient presents with   Constipation    Pt is having to take OTC laxatives for relief    History of Present Illness   [name] is a 73 y.o. male presenting today for follow-up.  Last seen in December.  At that time he was seen for 1 year history of intermittent anal pain and solid food dysphagia.  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  History of GI bleed secondary to small anastomotic ulcer in the past.  Today: He completed colonoscopy as outlined below. He noted issues with constipation, started 3 weeks ago. Noted sure if related to Zyprexa. Started  5mg  in 10/2023, dose increased to 10mg  in 11/2023. Can go days without BM. Stools hard. Getting urge to have BM but then not able to go. Has been using enemas and bisacodyl suppositories on occasion. Recently started senna two daily, took several days to work. He contacted Dr. Jennelle Human, behavioral health, gave permission to stop Zyprexa if he desired to.  Encouraged him to try magnesium citrate to relieve constipation.  Patient states that Zyprexa was helping him sleep at night but he does not want to have constipation. Notably, patient states his one year long history of intermittent rectal pain resolved. No abdominal pain. No UGI symptoms.   Labs from December 2024: Total bilirubin 0.7, alkaline phosphatase 157, AST 51, ALT 58, albumin 4.2.  Labs from 2022: LFTs normal.  Colonoscopy January 2025: -Diverticulosis in the sigmoid colon and descending colon -Prior right hemicolectomy.  Chronic anastomotic ulcer.  Status post biopsy, colonic mucosa with nonspecific inflammatory architectural changes, consistent with anastomotic site, no malignancy normal-appearing rectum. -Recommend surveillance  colonoscopy in 5 years  EGD January 2025: -Normal stomach -Normal esophagus status post dilation -Duodenal nodule status post biopsy, findings suggestive of nodular peptic duodenitis  Pertinent GI work-up:    EGD November 2016 with 2 to 3 cm submucosal, hard mass with an 8-9 millimeters area of central ulceration in the proximal esophagus. Salmon-colored epithelium also noted suspicious for Barrett's. No esophagitis. Gastric biopsy with chronic active gastritis with H pylori, subsequently treated (documented eradication in March 2017), esophagogastric junction biopsy gastric-type mucosa with mixed inflammation and lymphoid follicles. Esophageal mass biopsy benign, it was felt that biopsy may not be sufficient due to the approach.    EUS with Dr. Rob Bunting on 11/20/2015. At this time the EGD findings were entirely normal. EUS findings of the esophagus normal. He had a CT of the chest which was normal. Questionable mass related to GERD, edema resolved after starting PPI therapy?   EGD in November 2018 for nausea, completely normal.   Small bowel obstruction in August 2020.   Colonoscopy October 2020: Diverticulosis in the sigmoid colon and descending colon, focal ulcerations in the distal neoterminal ileum s/p biopsy.  Recommended repeat colonoscopy in 5 years for surveillance.  Pathology revealed focal acute inflammation and granulation tissue consistent with ulcer.  No granulomas, adenomatous change, or malignancy.  Recommended Givens capsule to evaluate small bowel.    Givens capsule 11/12/2019: Scattered erosions throughout the small bowel.  Ulceration again seen at the anastomosis.  No active bleeding.  Suspected possible ischemia.  NSAIDs are possibility but patient denied.  Otherwise, nonspecific.  Could  be early inflammatory bowel disease but findings are subtle.  Additionally, there was a questionable lesion found within the small bowel with recommendations to pursue CT for further  evaluation.  Otherwise, recommended 45-month follow-up.   CTE 01/10/2020: No concerning lesions identified.  No small bowel mass, inflammation, obstruction, or stricturing.  Possible small bowel diverticulum without inflammation.  Dr. Jena Gauss also reviewed study.  Stated we may have just simply seen a "incidentaloma" on Givens.  Could consider repeating the scan in 1 year.  No strong case for IBD.   CTA abdomen pelvis with contrast December 2023: No acute intra-abdominal or intrapelvic abnormality.  Hepatic steatosis.      Medications   Current Outpatient Medications  Medication Sig Dispense Refill   ALPRAZolam (XANAX) 0.5 MG tablet TAKE 1 TABLET(0.5 MG) BY MOUTH THREE TIMES DAILY AS NEEDED FOR ANXIETY 60 tablet 1   aspirin EC 81 MG tablet Take 81 mg by mouth daily. Swallow whole.     atorvastatin (LIPITOR) 10 MG tablet Take 10 mg by mouth daily.     brimonidine (ALPHAGAN) 0.2 % ophthalmic solution Place 1 drop into both eyes 2 (two) times daily.      donepezil (ARICEPT) 10 MG tablet TAKE 1 TABLET(10 MG) BY MOUTH AT BEDTIME 30 tablet 0   dorzolamide-timolol (COSOPT) 22.3-6.8 MG/ML ophthalmic solution Place 1 drop into both eyes 2 (two) times daily.     fluticasone (FLONASE) 50 MCG/ACT nasal spray Place 2 sprays into both nostrils daily as needed for allergies.   2   latanoprost (XALATAN) 0.005 % ophthalmic solution 1 drop at bedtime.     metFORMIN (GLUCOPHAGE-XR) 500 MG 24 hr tablet Take 500 mg by mouth daily.     Multiple Vitamin (MULTIVITAMIN) tablet Take 1 tablet by mouth daily.     pantoprazole (PROTONIX) 40 MG tablet Take 1 tablet (40 mg total) by mouth 2 (two) times daily before a meal. 60 tablet 11   traZODone (DESYREL) 150 MG tablet Take 150 mg by mouth at bedtime.     vitamin B-12 (CYANOCOBALAMIN) 500 MCG tablet Take 500 mcg by mouth every evening.     OLANZapine (ZYPREXA) 10 MG tablet Take 1 tablet (10 mg total) by mouth at bedtime. (Patient not taking: Reported on 02/14/2024) 90  tablet 0   No current facility-administered medications for this visit.    Allergies   Allergies as of 02/14/2024 - Review Complete 02/14/2024  Allergen Reaction Noted   Ambien [zolpidem tartrate]  09/12/2018   Bactrim [sulfamethoxazole-trimethoprim] Hives and Swelling 09/08/2016   Dilaudid [hydromorphone hcl] Other (See Comments) 09/08/2016   Morphine and codeine Nausea And Vomiting 09/08/2016      Review of Systems   General: Negative for anorexia, weight loss, fever, chills, fatigue, weakness. ENT: Negative for hoarseness, difficulty swallowing , nasal congestion. CV: Negative for chest pain, angina, palpitations, dyspnea on exertion, peripheral edema.  Respiratory: Negative for dyspnea at rest, dyspnea on exertion, cough, sputum, wheezing.  GI: See history of present illness. GU:  Negative for dysuria, hematuria, urinary incontinence, urinary frequency, nocturnal urination.  Endo: Negative for unusual weight change.     Physical Exam   BP 125/74 (BP Location: Right Arm, Patient Position: Sitting, Cuff Size: Large)   Pulse (!) 49   Temp 97.9 F (36.6 C) (Oral)   Ht 5\' 9"  (1.753 m)   Wt 211 lb 9.6 oz (96 kg)   SpO2 97%   BMI 31.25 kg/m    General: Well-nourished, well-developed in no acute distress.  Eyes: No icterus. Mouth: Oropharyngeal mucosa moist and pink   Abdomen: Bowel sounds are normal, nontender, nondistended, no hepatosplenomegaly or masses,  no abdominal bruits or hernia , no rebound or guarding.  Rectal: not performed  Extremities: No lower extremity edema. No clubbing or deformities. Neuro: Alert and oriented x 4   Skin: Warm and dry, no jaundice.   Psych: Alert and cooperative, normal mood and affect.  Labs   See hpi  Imaging Studies   No results found.  Assessment/Plan:   GERD: -doing well at this time -Continue pantoprazole 40 mg twice daily for now.  Was recently increased at time of endoscopy.  Constipation: -Start MiraLAX 1 capful 3  times daily until soft stool, then continue once daily. -Increase dietary fiber -Consume 96 ounces of water daily -Patient is to reach out if constipation remains an issue -TSH, free T4  Elevated LFTs: -Recheck LFTs, viral markers, serologies.    Leanna Battles. Melvyn Neth, MHS, PA-C [facility]

## 2024-02-14 NOTE — Patient Instructions (Signed)
 Please complete labs at your convenience. I am checking your thyroid function and following up on your elevated liver enzymes.  Start miralax one capful mixed in 6 ounces of liquid, three times a day until you have a soft stool. Then continue one capful daily. Increase dietary fiber and drink at least 96 ounces of water daily. Please call if you continue to have issues with constipation.  We will be in touch with lab results.

## 2024-02-15 DIAGNOSIS — K5900 Constipation, unspecified: Secondary | ICD-10-CM | POA: Diagnosis not present

## 2024-02-15 DIAGNOSIS — R7989 Other specified abnormal findings of blood chemistry: Secondary | ICD-10-CM | POA: Diagnosis not present

## 2024-02-16 ENCOUNTER — Encounter (HOSPITAL_COMMUNITY): Payer: Self-pay

## 2024-02-16 ENCOUNTER — Emergency Department (HOSPITAL_COMMUNITY)
Admission: EM | Admit: 2024-02-16 | Discharge: 2024-02-16 | Disposition: A | Discharge status: Home / Self Care | Payer: Medicare PPO | Attending: Emergency Medicine | Admitting: Emergency Medicine

## 2024-02-16 ENCOUNTER — Emergency Department (HOSPITAL_COMMUNITY): Payer: Medicare PPO

## 2024-02-16 ENCOUNTER — Other Ambulatory Visit: Payer: Self-pay

## 2024-02-16 DIAGNOSIS — R112 Nausea with vomiting, unspecified: Secondary | ICD-10-CM | POA: Diagnosis not present

## 2024-02-16 DIAGNOSIS — N3289 Other specified disorders of bladder: Secondary | ICD-10-CM | POA: Diagnosis not present

## 2024-02-16 DIAGNOSIS — R1084 Generalized abdominal pain: Secondary | ICD-10-CM | POA: Diagnosis not present

## 2024-02-16 DIAGNOSIS — Z7982 Long term (current) use of aspirin: Secondary | ICD-10-CM | POA: Diagnosis not present

## 2024-02-16 DIAGNOSIS — R109 Unspecified abdominal pain: Secondary | ICD-10-CM | POA: Diagnosis present

## 2024-02-16 DIAGNOSIS — Z85038 Personal history of other malignant neoplasm of large intestine: Secondary | ICD-10-CM | POA: Insufficient documentation

## 2024-02-16 DIAGNOSIS — R140 Abdominal distension (gaseous): Secondary | ICD-10-CM | POA: Diagnosis not present

## 2024-02-16 DIAGNOSIS — Z7984 Long term (current) use of oral hypoglycemic drugs: Secondary | ICD-10-CM | POA: Diagnosis not present

## 2024-02-16 DIAGNOSIS — N281 Cyst of kidney, acquired: Secondary | ICD-10-CM | POA: Diagnosis not present

## 2024-02-16 LAB — CBC WITH DIFFERENTIAL/PLATELET
Abs Immature Granulocytes: 0.02 10*3/uL (ref 0.00–0.07)
Basophils Absolute: 0 10*3/uL (ref 0.0–0.1)
Basophils Relative: 0 %
Eosinophils Absolute: 0 10*3/uL (ref 0.0–0.5)
Eosinophils Relative: 1 %
HCT: 46.5 % (ref 39.0–52.0)
Hemoglobin: 16.8 g/dL (ref 13.0–17.0)
Immature Granulocytes: 0 %
Lymphocytes Relative: 5 %
Lymphs Abs: 0.4 10*3/uL — ABNORMAL LOW (ref 0.7–4.0)
MCH: 30.7 pg (ref 26.0–34.0)
MCHC: 36.1 g/dL — ABNORMAL HIGH (ref 30.0–36.0)
MCV: 85 fL (ref 80.0–100.0)
Monocytes Absolute: 0.6 10*3/uL (ref 0.1–1.0)
Monocytes Relative: 8 %
Neutro Abs: 7.3 10*3/uL (ref 1.7–7.7)
Neutrophils Relative %: 86 %
Platelets: 141 10*3/uL — ABNORMAL LOW (ref 150–400)
RBC: 5.47 MIL/uL (ref 4.22–5.81)
RDW: 12.7 % (ref 11.5–15.5)
WBC: 8.4 10*3/uL (ref 4.0–10.5)
nRBC: 0 % (ref 0.0–0.2)

## 2024-02-16 LAB — BASIC METABOLIC PANEL
Anion gap: 12 (ref 5–15)
BUN: 19 mg/dL (ref 8–23)
CO2: 25 mmol/L (ref 22–32)
Calcium: 9.5 mg/dL (ref 8.9–10.3)
Chloride: 101 mmol/L (ref 98–111)
Creatinine, Ser: 1.04 mg/dL (ref 0.61–1.24)
GFR, Estimated: >60 mL/min (ref >60)
Glucose, Bld: 135 mg/dL — ABNORMAL HIGH (ref 70–99)
Potassium: 4.2 mmol/L (ref 3.5–5.1)
Sodium: 138 mmol/L (ref 135–145)

## 2024-02-16 MED ORDER — ONDANSETRON HCL 4 MG/2ML IJ SOLN
4.0000 mg | Freq: Once | INTRAMUSCULAR | Status: AC
  Administered 2024-02-16: 4 mg via INTRAVENOUS

## 2024-02-16 MED ORDER — FENTANYL CITRATE PF 50 MCG/ML IJ SOSY
PREFILLED_SYRINGE | INTRAMUSCULAR | Status: AC
  Filled 2024-02-16: qty 1

## 2024-02-16 MED ORDER — ONDANSETRON HCL 4 MG/2ML IJ SOLN
INTRAMUSCULAR | Status: AC
  Filled 2024-02-16: qty 2

## 2024-02-16 MED ORDER — IOHEXOL 300 MG/ML  SOLN
100.0000 mL | Freq: Once | INTRAMUSCULAR | Status: AC | PRN
  Administered 2024-02-16: 100 mL via INTRAVENOUS

## 2024-02-16 MED ORDER — FENTANYL CITRATE PF 50 MCG/ML IJ SOSY
50.0000 ug | PREFILLED_SYRINGE | Freq: Once | INTRAMUSCULAR | Status: AC
  Administered 2024-02-16: 50 ug via INTRAVENOUS

## 2024-02-16 NOTE — ED Triage Notes (Signed)
 Pov form home. Cc of no bowel movement since Monday.  Tried miralax 3x a day. And enema as well.  When he ate dinner tonight it caused him to have abdominal pain.

## 2024-02-16 NOTE — ED Notes (Signed)
 Ginger ale given

## 2024-02-16 NOTE — ED Provider Notes (Signed)
 [place] EMERGENCY DEPARTMENT AT [facility] Provider Note   CSN: [id number] Arrival date & time: 02/16/24  0112     History  Chief Complaint  Patient presents with   Constipation    Monday    [name] is a 73 y.o. male.  The history is provided by the patient.  Patient with history of colon cancer s/p right hemicolectomy presents with constipation He reports no bowel movement for over 48 hours.  He was seen by his gastroenterologist in February 25 and instructed to use MiraLAX x 3.  He has used it without any improvement.  Tonight after eating dinner he had the feeling that the food was stuck in his abdomen with mild abdominal discomfort.  No fevers or vomiting but he feels that he may need to force himself to throw up No bloody or dark stools.  No urinary symptoms.    Home Medications Prior to Admission medications   Medication Sig Start Date End Date Taking? Authorizing Provider  ALPRAZolam Prudy Feeler) 0.5 MG tablet TAKE 1 TABLET(0.5 MG) BY MOUTH THREE TIMES DAILY AS NEEDED FOR ANXIETY 02/02/24   Cottle, Steva Ready., MD  aspirin EC 81 MG tablet Take 81 mg by mouth daily. Swallow whole.    [provider]  atorvastatin (LIPITOR) 10 MG tablet Take 10 mg by mouth daily. 08/05/20   [provider]  brimonidine (ALPHAGAN) 0.2 % ophthalmic solution Place 1 drop into both eyes 2 (two) times daily.     [provider]  donepezil (ARICEPT) 10 MG tablet TAKE 1 TABLET(10 MG) BY MOUTH AT BEDTIME 01/18/24   Cottle, Steva Ready., MD  dorzolamide-timolol (COSOPT) 22.3-6.8 MG/ML ophthalmic solution Place 1 drop into both eyes 2 (two) times daily.    [provider]  fluticasone (FLONASE) 50 MCG/ACT nasal spray Place 2 sprays into both nostrils daily as needed for allergies.  09/21/18   [provider]  latanoprost (XALATAN) 0.005 % ophthalmic solution 1 drop at bedtime. 05/15/22   [provider]  metFORMIN (GLUCOPHAGE-XR) 500 MG 24  hr tablet Take 500 mg by mouth daily. 02/12/24   [provider]  Multiple Vitamin (MULTIVITAMIN) tablet Take 1 tablet by mouth daily.    [provider]  OLANZapine (ZYPREXA) 10 MG tablet Take 1 tablet (10 mg total) by mouth at bedtime. Patient not taking: Reported on 02/14/2024 11/25/23   Lauraine Rinne., MD  pantoprazole (PROTONIX) 40 MG tablet Take 1 tablet (40 mg total) by mouth 2 (two) times daily before a meal. 12/29/23   Rourk, Gerrit Friends, MD  traZODone (DESYREL) 150 MG tablet Take 150 mg by mouth at bedtime. 11/26/23   [provider]  vitamin B-12 (CYANOCOBALAMIN) 500 MCG tablet Take 500 mcg by mouth every evening.    [provider]      Allergies    Ambien [zolpidem tartrate], Bactrim [sulfamethoxazole-trimethoprim], Dilaudid [hydromorphone hcl], and Morphine and codeine    Review of Systems   Review of Systems  Constitutional:  Negative for fever.  Respiratory:  Negative for shortness of breath.   Cardiovascular:  Negative for chest pain.  Gastrointestinal:  Positive for abdominal pain and constipation.    Physical Exam Updated Vital Signs BP 138/86   Pulse 100   Temp 98.8 F (37.1 C)   Resp 18   Ht 1.753 m (5\' 9" )   Wt 96 kg   SpO2 91%   BMI 31.25 kg/m  Physical Exam CONSTITUTIONAL: Elderly, no  acute distress HEAD: Normocephalic/atraumatic ENMT: Mucous membranes moist NECK: supple no meningeal signs CV: S1/S2 noted, no murmurs/rubs/gallops noted LUNGS: Lungs are clear to auscultation bilaterally, no apparent distress ABDOMEN: soft, mild diffuse tenderness, no rebound or guarding, bowel sounds noted throughout abdomen GU: Nurse present for exam, no inguinal hernia, no scrotal tenderness Rectal-no blood or melena, no stool impaction, no rectal mass or abscess, chaperone present NEURO: Pt is awake/alert/appropriate, moves all extremitiesx4.  No facial droop.   SKIN: warm, color normal PSYCH: no abnormalities of mood noted, alert  and oriented to situation  ED Results / Procedures / Treatments   Labs (all labs ordered are listed, but only abnormal results are displayed) Labs Reviewed  CBC WITH DIFFERENTIAL/PLATELET - Abnormal; Notable for the following components:      Result Value   MCHC 36.1 (*)    Platelets 141 (*)    Lymphs Abs 0.4 (*)    All other components within normal limits  BASIC METABOLIC PANEL - Abnormal; Notable for the following components:   Glucose, Bld 135 (*)    All other components within normal limits    EKG None  Radiology CT ABDOMEN PELVIS W CONTRAST Result Date: 02/16/2024 CLINICAL DATA:  No bowel movements for 3 days, initial encounter EXAM: CT ABDOMEN AND PELVIS WITH CONTRAST TECHNIQUE: Multidetector CT imaging of the abdomen and pelvis was performed using the standard protocol following bolus administration of intravenous contrast. RADIATION DOSE REDUCTION: This exam was performed according to the departmental dose-optimization program which includes automated exposure control, adjustment of the mA and/or kV according to patient size and/or use of iterative reconstruction technique. CONTRAST:  OMNIPAQUE IOHEXOL 300 MG/ML  SOLN COMPARISON:  01/28/2021 FINDINGS: Lower chest: No acute abnormality. Hepatobiliary: No focal liver abnormality is seen. No gallstones, gallbladder wall thickening, or biliary dilatation. Pancreas: Unremarkable. No pancreatic ductal dilatation or surrounding inflammatory changes. Spleen: Normal in size without focal abnormality. Adrenals/Urinary Tract: Adrenal glands are within normal limits. Kidneys are well visualized bilaterally. No renal calculi or obstructive changes are noted. Simple cysts are noted within the left kidney. No follow-up is recommended. The bladder is partially distended. Stomach/Bowel: Postsurgical changes are noted in the right colon. The appendix is not visualized and likely surgically removed. No obstructive changes or findings of  constipation are seen. Small bowel shows no obstructive change. Stomach is well distended with ingested food stuffs. Vascular/Lymphatic: Aortic atherosclerosis. No enlarged abdominal or pelvic lymph nodes. Reproductive: Prostate is unremarkable. Other: No abdominal wall hernia or abnormality. No abdominopelvic ascites. Musculoskeletal: No acute or significant osseous findings. IMPRESSION: No acute abnormality to correspond with the given clinical history. Electronically Signed   By: Alcide Clever M.D.   On: 02/16/2024 03:25    Procedures Procedures    Medications Ordered in ED Medications  ondansetron (ZOFRAN) injection 4 mg (4 mg Intravenous Given 02/16/24 0244)  fentaNYL (SUBLIMAZE) injection 50 mcg (50 mcg Intravenous Given 02/16/24 0244)  iohexol (OMNIPAQUE) 300 MG/ML solution 100 mL (100 mLs Intravenous Contrast Given 02/16/24 0258)    ED Course/ Medical Decision Making/ A&P Clinical Course as of 02/16/24 0600  Thu Feb 16, 2024  0344 Patient presented for concerns for constipation and then started having vomiting in the ED.  Complicated history including previous hemicolectomy.  Overall CT imaging does not show any acute findings.  Labs are reassuring.  Will trial p.o. and reassess  Patient did drive to the hospital, will need to be monitored after being given narcotics [DW]  0600 Patient continues  to improve, watching videos, drinking ginger ale.  Patient will be safe for discharge He can f/u with GI [DW]    Clinical Course User Index [DW] Zadie Rhine, MD                                 Medical Decision Making Amount and/or Complexity of Data Reviewed Labs: ordered. Radiology: ordered.  Risk Prescription drug management.   This patient presents to the ED for concern of abdominal pain and constipation, this involves an extensive number of treatment options, and is a complaint that carries with it a high risk of complications and morbidity.  The differential diagnosis  includes but is not limited to cholecystitis, cholelithiasis, pancreatitis, gastritis, peptic ulcer disease, bowel obstruction, bowel perforation, AAA, ischemic bowel   Comorbidities that complicate the patient evaluation: Patient's presentation is complicated by their history of colon cancer  Additional history obtained: Records reviewed  GI notes reviewed  Lab Tests: I Ordered, and personally interpreted labs.  The pertinent results include: Labs overall unremarkable  Imaging Studies ordered: I ordered imaging studies including CT scan abdomen pelvis   I independently visualized and interpreted imaging which showed no acute findings I agree with the radiologist interpretation   Medicines ordered and prescription drug management: I ordered medication including fentanyl for analgesia Reevaluation of the patient after these medicines showed that the patient    improved  Reevaluation: After the interventions noted above, I reevaluated the patient and found that they have :improved  Complexity of problems addressed: Patient's presentation is most consistent with  acute presentation with potential threat to life or bodily function  Disposition: After consideration of the diagnostic results and the patient's response to treatment,  I feel that the patent would benefit from discharge   .           Final Clinical Impression(s) / ED Diagnoses Final diagnoses:  Generalized abdominal pain  Nausea and vomiting, unspecified vomiting type    Rx / DC Orders ED Discharge Orders     None         Zadie Rhine, MD 02/16/24 [phone]

## 2024-02-16 NOTE — ED Notes (Signed)
 Pt care taken, has gone to ct scan.

## 2024-02-16 NOTE — Discharge Instructions (Signed)
   SEEK IMMEDIATE MEDICAL ATTENTION IF: The pain does not go away or becomes severe, particularly over the next 8-12 hours.  A temperature above 100.2F develops.  Repeated vomiting occurs (multiple episodes).   Blood is being passed in stools or vomit (bright red or black tarry stools).  Return also if you develop chest pain, difficulty breathing, dizziness or fainting, or become confused, poorly responsive, or inconsolable.

## 2024-02-19 LAB — COMPREHENSIVE METABOLIC PANEL
ALT: 31 IU/L (ref 0–44)
AST: 31 IU/L (ref 0–40)
Albumin: 4.8 g/dL (ref 3.8–4.8)
Alkaline Phosphatase: 136 IU/L — ABNORMAL HIGH (ref 44–121)
BUN/Creatinine Ratio: 16 (ref 10–24)
BUN: 16 mg/dL (ref 8–27)
Bilirubin Total: 0.8 mg/dL (ref 0.0–1.2)
CO2: 24 mmol/L (ref 20–29)
Calcium: 10 mg/dL (ref 8.6–10.2)
Chloride: 99 mmol/L (ref 96–106)
Creatinine, Ser: 1.01 mg/dL (ref 0.76–1.27)
Globulin, Total: 2.6 g/dL (ref 1.5–4.5)
Glucose: 82 mg/dL (ref 70–99)
Potassium: 4.6 mmol/L (ref 3.5–5.2)
Sodium: 142 mmol/L (ref 134–144)
Total Protein: 7.4 g/dL (ref 6.0–8.5)
eGFR: 80 mL/min/{1.73_m2} (ref >59)

## 2024-02-19 LAB — CBC WITH DIFFERENTIAL/PLATELET
Basophils Absolute: 0 10*3/uL (ref 0.0–0.2)
Basos: 0 %
EOS (ABSOLUTE): 0.1 10*3/uL (ref 0.0–0.4)
Eos: 2 %
Hematocrit: 47.1 % (ref 37.5–51.0)
Hemoglobin: 15.9 g/dL (ref 13.0–17.7)
Immature Grans (Abs): 0 10*3/uL (ref 0.0–0.1)
Immature Granulocytes: 0 %
Lymphocytes Absolute: 1.3 10*3/uL (ref 0.7–3.1)
Lymphs: 24 %
MCH: 30.6 pg (ref 26.6–33.0)
MCHC: 33.8 g/dL (ref 31.5–35.7)
MCV: 91 fL (ref 79–97)
Monocytes Absolute: 0.7 10*3/uL (ref 0.1–0.9)
Monocytes: 12 %
Neutrophils Absolute: 3.3 10*3/uL (ref 1.4–7.0)
Neutrophils: 62 %
Platelets: 151 10*3/uL (ref 150–450)
RBC: 5.2 x10E6/uL (ref 4.14–5.80)
RDW: 13.2 % (ref 11.6–15.4)
WBC: 5.4 10*3/uL (ref 3.4–10.8)

## 2024-02-19 LAB — HEPATITIS B CORE ANTIBODY, TOTAL: Hep B Core Total Ab: NEGATIVE

## 2024-02-19 LAB — TSH+FREE T4
Free T4: 1.13 ng/dL (ref 0.82–1.77)
TSH: 3.35 u[IU]/mL (ref 0.450–4.500)

## 2024-02-19 LAB — HEPATITIS C ANTIBODY: Hep C Virus Ab: NONREACTIVE

## 2024-02-19 LAB — IRON,TIBC AND FERRITIN PANEL
Ferritin: 356 ng/mL (ref 30–400)
Iron Saturation: 39 % (ref 15–55)
Iron: 116 ug/dL (ref 38–169)
Total Iron Binding Capacity: 299 ug/dL (ref 250–450)
UIBC: 183 ug/dL (ref 111–343)

## 2024-02-19 LAB — HEPATITIS B SURFACE ANTIGEN: Hepatitis B Surface Ag: NEGATIVE

## 2024-02-19 LAB — IGG, IGA, IGM
IgA/Immunoglobulin A, Serum: 440 mg/dL — ABNORMAL HIGH (ref 61–437)
IgG (Immunoglobin G), Serum: 1023 mg/dL (ref 603–1613)
IgM (Immunoglobulin M), Srm: 99 mg/dL (ref 15–143)

## 2024-02-19 LAB — ANA: Anti Nuclear Antibody (ANA): NEGATIVE

## 2024-02-19 LAB — MITOCHONDRIAL/SMOOTH MUSCLE AB PNL
Mitochondrial Ab: <20 U (ref 0.0–20.0)
Smooth Muscle Ab: 46 U — ABNORMAL HIGH (ref 0–19)

## 2024-02-19 LAB — TISSUE TRANSGLUTAMINASE, IGA: Transglutaminase IgA: 5 U/mL — ABNORMAL HIGH (ref 0–3)

## 2024-02-22 ENCOUNTER — Ambulatory Visit: Payer: Medicare PPO | Admitting: Psychiatry

## 2024-02-22 ENCOUNTER — Encounter: Payer: Self-pay | Admitting: Psychiatry

## 2024-02-22 DIAGNOSIS — F331 Major depressive disorder, recurrent, moderate: Secondary | ICD-10-CM | POA: Diagnosis not present

## 2024-02-22 DIAGNOSIS — Z538 Procedure and treatment not carried out for other reasons: Secondary | ICD-10-CM

## 2024-02-22 DIAGNOSIS — Z85038 Personal history of other malignant neoplasm of large intestine: Secondary | ICD-10-CM | POA: Diagnosis not present

## 2024-02-22 DIAGNOSIS — I10 Essential (primary) hypertension: Secondary | ICD-10-CM | POA: Diagnosis not present

## 2024-02-22 DIAGNOSIS — Z6830 Body mass index (BMI) 30.0-30.9, adult: Secondary | ICD-10-CM | POA: Diagnosis not present

## 2024-02-22 DIAGNOSIS — E782 Mixed hyperlipidemia: Secondary | ICD-10-CM | POA: Diagnosis not present

## 2024-02-22 DIAGNOSIS — E7849 Other hyperlipidemia: Secondary | ICD-10-CM | POA: Diagnosis not present

## 2024-02-22 NOTE — Progress Notes (Signed)
 Appt cancelled by office at pt request bc MD running late

## 2024-02-23 ENCOUNTER — Other Ambulatory Visit: Payer: Self-pay | Admitting: Psychiatry

## 2024-02-23 DIAGNOSIS — F341 Dysthymic disorder: Secondary | ICD-10-CM

## 2024-02-23 DIAGNOSIS — F411 Generalized anxiety disorder: Secondary | ICD-10-CM

## 2024-02-23 DIAGNOSIS — F5105 Insomnia due to other mental disorder: Secondary | ICD-10-CM

## 2024-02-23 DIAGNOSIS — F331 Major depressive disorder, recurrent, moderate: Secondary | ICD-10-CM

## 2024-02-24 ENCOUNTER — Encounter: Payer: Self-pay | Admitting: Psychiatry

## 2024-02-24 ENCOUNTER — Ambulatory Visit (INDEPENDENT_AMBULATORY_CARE_PROVIDER_SITE_OTHER): Admitting: Psychiatry

## 2024-02-24 DIAGNOSIS — F341 Dysthymic disorder: Secondary | ICD-10-CM

## 2024-02-24 DIAGNOSIS — F902 Attention-deficit hyperactivity disorder, combined type: Secondary | ICD-10-CM | POA: Diagnosis not present

## 2024-02-24 DIAGNOSIS — F331 Major depressive disorder, recurrent, moderate: Secondary | ICD-10-CM | POA: Diagnosis not present

## 2024-02-24 DIAGNOSIS — K5903 Drug induced constipation: Secondary | ICD-10-CM | POA: Diagnosis not present

## 2024-02-24 DIAGNOSIS — Z630 Problems in relationship with spouse or partner: Secondary | ICD-10-CM | POA: Diagnosis not present

## 2024-02-24 DIAGNOSIS — F5105 Insomnia due to other mental disorder: Secondary | ICD-10-CM

## 2024-02-24 DIAGNOSIS — F411 Generalized anxiety disorder: Secondary | ICD-10-CM

## 2024-02-24 MED ORDER — QUETIAPINE FUMARATE 100 MG PO TABS: 100.0000 mg | ORAL_TABLET | Freq: Every day | ORAL | 1 refills | Status: DC

## 2024-02-24 NOTE — Patient Instructions (Addendum)
 Constipation management options 1.  Lots of water 2.  continue MiraLAX 3 times daily, preferably with a meal 3.  2 stool softeners once or twice a day 4.  Milk of magnesia or magnesium citrate  if needed 5. 2 senna tablets daily  Stop olanzapine and trazodone Start quetiapine 100 mg tablet

## 2024-02-24 NOTE — Telephone Encounter (Signed)
 Appt today
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
## 2024-02-27 ENCOUNTER — Other Ambulatory Visit: Payer: Self-pay

## 2024-02-27 DIAGNOSIS — R7989 Other specified abnormal findings of blood chemistry: Secondary | ICD-10-CM

## 2024-02-27 DIAGNOSIS — R768 Other specified abnormal immunological findings in serum: Secondary | ICD-10-CM

## 2024-03-02 ENCOUNTER — Telehealth: Payer: Self-pay | Admitting: Psychiatry

## 2024-03-02 NOTE — Telephone Encounter (Signed)
 LVM to [facility]

## 2024-03-02 NOTE — Telephone Encounter (Signed)
 Patient lvm stating the Seroquel 100 mg is not helping him sleep. It has been two nights he hasn't closed his eyes until 6 am. Please advise.  Appointment 05/03/24

## 2024-03-02 NOTE — Telephone Encounter (Signed)
 Unusual for quetiapine not to work.  Maybe the dose is too low.  Ok to increase quetiapine to 200-300 mg nightly.

## 2024-03-02 NOTE — Telephone Encounter (Signed)
 Patient notified of recommendation. Asked him to touch base next week and update [place] on how he is sleeping.

## 2024-03-02 NOTE — Telephone Encounter (Signed)
 Pt was seen on 3/7.   alprazolam 0.5 mg twice daily as needed anxiety,  Continue donepezil 10 daily, and B12 Stop olanzapine and trazodone Start quetiapine 100 mg tablet   The last 2 nights he has taken Seroquel and doesn't get to sleep until 6 AM. Reports he is able to stay asleep once he gets to sleep, but only getting 4 hours of sleep. He said last night he took trazodone in addition to the Seroquel with no benefit.  He reports his eyes are closed but his brain won't shut off.  Reviewed sleep hygiene and no areas of concern.  Lots of stress as documented in your note.

## 2024-03-16 DIAGNOSIS — J301 Allergic rhinitis due to pollen: Secondary | ICD-10-CM | POA: Diagnosis not present

## 2024-03-16 DIAGNOSIS — R0982 Postnasal drip: Secondary | ICD-10-CM | POA: Diagnosis not present

## 2024-03-16 DIAGNOSIS — R490 Dysphonia: Secondary | ICD-10-CM | POA: Diagnosis not present

## 2024-03-26 ENCOUNTER — Other Ambulatory Visit: Payer: Self-pay | Admitting: Psychiatry

## 2024-03-26 DIAGNOSIS — F411 Generalized anxiety disorder: Secondary | ICD-10-CM

## 2024-03-27 ENCOUNTER — Other Ambulatory Visit: Payer: Self-pay

## 2024-03-27 DIAGNOSIS — R768 Other specified abnormal immunological findings in serum: Secondary | ICD-10-CM

## 2024-03-27 DIAGNOSIS — R7989 Other specified abnormal findings of blood chemistry: Secondary | ICD-10-CM

## 2024-03-30 NOTE — Telephone Encounter (Signed)
 Pt Lvm @ 9:41a stating he has not been sleeping well.  He would like refill of Alprazolam sent to   [facility] [phone] - [place], [place] - [address] AT [facility] OF [address], [place] [address] Phone: [phone]  Fax: [phone]   Next appt 5/15

## 2024-03-30 NOTE — Telephone Encounter (Signed)
 This was sent to Dr. Jennelle Human to approve early this morning.

## 2024-04-04 ENCOUNTER — Encounter: Payer: Self-pay | Admitting: Internal Medicine

## 2024-04-10 DIAGNOSIS — R768 Other specified abnormal immunological findings in serum: Secondary | ICD-10-CM | POA: Diagnosis not present

## 2024-04-10 DIAGNOSIS — R7989 Other specified abnormal findings of blood chemistry: Secondary | ICD-10-CM | POA: Diagnosis not present

## 2024-04-16 ENCOUNTER — Ambulatory Visit: Admitting: Gastroenterology

## 2024-04-16 ENCOUNTER — Encounter: Payer: Self-pay | Admitting: Gastroenterology

## 2024-04-16 VITALS — BP 130/78 | HR 55 | Temp 98.1°F | Ht 68.5 in | Wt 199.4 lb

## 2024-04-16 DIAGNOSIS — K219 Gastro-esophageal reflux disease without esophagitis: Secondary | ICD-10-CM | POA: Diagnosis not present

## 2024-04-16 DIAGNOSIS — R7989 Other specified abnormal findings of blood chemistry: Secondary | ICD-10-CM | POA: Diagnosis not present

## 2024-04-16 DIAGNOSIS — K5900 Constipation, unspecified: Secondary | ICD-10-CM

## 2024-04-16 NOTE — Patient Instructions (Signed)
 We will be in touch in the next few days when your final celiac labs return!  Further instructions to follow!

## 2024-04-16 NOTE — Progress Notes (Signed)
 GI Office Note    Referring Provider: Lova Rua Primary Care Physician:  Lova Rua  Primary Gastroenterologist: Rheba Cedar, MD   Chief Complaint   Chief Complaint  Patient presents with   Follow-up    Needs to discuss test results     History of Present Illness   [name] is a 73 y.o. male presenting today for concerns about a test. Last seen 01/2024. 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 History of GI bleed secondary to small anastomotic ulcer in the past.   Since last ov, work up initiated for elevated alkphos and AST/ALT. Weakly positive celiac marker, waiting for celiac disease HLA labs, still pending. Positive smooth muscle ab and mildly elevated AP.  Labs April 10 2024: Albumin 4.4, total bilirubin 0.5, alk phos 128H, AST 26, ALT 26  Labs from February 2025: Platelets 141,000L, iron 116, iron sat 39%, ferritin 356, TIBC 299, LFTs normal except for alk phos 136H, IgG 1023, IgA slightly elevated at 440, IgM 99, smooth muscle antibody elevated at 46, AMA less than 20, HCV antibody negative, TTG IgA slightly elevated at 5, ANA negative, hepatitis B surface antigen negative, hepatitis B core antibody total negative, hepatitis C antibody nonreactive.  Labs from December 2024: Total bilirubin 0.7, alkaline phosphatase 157, AST 51, ALT 58, albumin 4.2.   Today: BMs more regular on Miralax every other day. No abdominal pain. No ugi symptoms.    CT A/P with contrast 01/2024: IMPRESSION: No acute abnormality to correspond with the given clinical history.   Labs from 2022: LFTs normal.   Colonoscopy January 2025: -Diverticulosis in the sigmoid colon and descending colon -Prior right hemicolectomy.  Chronic anastomotic ulcer.  Status post biopsy, colonic mucosa with nonspecific inflammatory architectural changes, consistent with anastomotic site, no malignancy normal-appearing rectum. -Recommend  surveillance colonoscopy in 5 years   EGD January 2025: -Normal stomach -Normal esophagus status post dilation -Duodenal nodule status post biopsy, findings suggestive of nodular peptic duodenitis  Pertinent GI work-up:    EGD November 2016 with 2 to 3 cm submucosal, hard mass with an 8-9 millimeters area of central ulceration in the proximal esophagus. Salmon-colored epithelium also noted suspicious for Barrett's. No esophagitis. Gastric biopsy with chronic active gastritis with H pylori, subsequently treated (documented eradication in March 2017), esophagogastric junction biopsy gastric-type mucosa with mixed inflammation and lymphoid follicles. Esophageal mass biopsy benign, it was felt that biopsy may not be sufficient due to the approach.    EUS with Dr. Hoyt Macleod on 11/20/2015. At this time the EGD findings were entirely normal. EUS findings of the esophagus normal. He had a CT of the chest which was normal. Questionable mass related to GERD, edema resolved after starting PPI therapy?   EGD in November 2018 for nausea, completely normal.   Small bowel obstruction in August 2020.   Colonoscopy October 2020: Diverticulosis in the sigmoid colon and descending colon, focal ulcerations in the distal neoterminal ileum s/p biopsy.  Recommended repeat colonoscopy in 5 years for surveillance.  Pathology revealed focal acute inflammation and granulation tissue consistent with ulcer.  No granulomas, adenomatous change, or malignancy.  Recommended Givens capsule to evaluate small bowel.    Givens capsule 11/12/2019: Scattered erosions throughout the small bowel.  Ulceration again seen at the anastomosis.  No active bleeding.  Suspected possible ischemia.  NSAIDs are possibility but patient denied.  Otherwise, nonspecific.  Could be  early inflammatory bowel disease but findings are subtle.  Additionally, there was a questionable lesion found within the small bowel with recommendations to pursue CT  for further evaluation.  Otherwise, recommended 54-month follow-up.   CTE 01/10/2020: No concerning lesions identified.  No small bowel mass, inflammation, obstruction, or stricturing.  Possible small bowel diverticulum without inflammation.  Dr. Riley Cheadle also reviewed study.  Stated we may have just simply seen a "incidentaloma" on Givens.  Could consider repeating the scan in 1 year.  No strong case for IBD.   CTA abdomen pelvis with contrast December 2023: No acute intra-abdominal or intrapelvic abnormality.  Hepatic steatosis.     Medications   Current Outpatient Medications  Medication Sig Dispense Refill   ALPRAZolam  (XANAX ) 0.5 MG tablet TAKE 1 TABLET(0.5 MG) BY MOUTH THREE TIMES DAILY AS NEEDED FOR ANXIETY 60 tablet 1   aspirin  EC 81 MG tablet Take 81 mg by mouth daily. Swallow whole.     atorvastatin (LIPITOR) 10 MG tablet Take 10 mg by mouth daily.     brimonidine  (ALPHAGAN ) 0.2 % ophthalmic solution Place 1 drop into both eyes 2 (two) times daily.      donepezil  (ARICEPT ) 10 MG tablet TAKE 1 TABLET(10 MG) BY MOUTH AT BEDTIME 30 tablet 0   dorzolamide -timolol  (COSOPT ) 22.3-6.8 MG/ML ophthalmic solution Place 1 drop into both eyes 2 (two) times daily.     fluticasone (FLONASE) 50 MCG/ACT nasal spray Place 2 sprays into both nostrils daily as needed for allergies.   2   latanoprost  (XALATAN ) 0.005 % ophthalmic solution 1 drop at bedtime.     metFORMIN (GLUCOPHAGE-XR) 500 MG 24 hr tablet Take 500 mg by mouth daily.     Multiple Vitamin (MULTIVITAMIN) tablet Take 1 tablet by mouth daily.     pantoprazole  (PROTONIX ) 40 MG tablet Take 1 tablet (40 mg total) by mouth 2 (two) times daily before a meal. 60 tablet 11   QUEtiapine  (SEROQUEL ) 100 MG tablet Take 1 tablet (100 mg total) by mouth at bedtime. 30 tablet 1   vitamin B-12 (CYANOCOBALAMIN ) 500 MCG tablet Take 500 mcg by mouth every evening.     No current facility-administered medications for this visit.    Allergies   Allergies as  of 04/16/2024 - Review Complete 04/16/2024  Allergen Reaction Noted   Ambien [zolpidem tartrate]  09/12/2018   Bactrim [sulfamethoxazole-trimethoprim] Hives and Swelling 09/08/2016   Dilaudid [hydromorphone hcl] Other (See Comments) 09/08/2016   Morphine  and codeine Nausea And Vomiting 09/08/2016     Review of Systems   General: Negative for anorexia, weight loss, fever, chills, fatigue, weakness. Chronic insomnia ENT: Negative for hoarseness, difficulty swallowing , nasal congestion. CV: Negative for chest pain, angina, palpitations, dyspnea on exertion, peripheral edema.  Respiratory: Negative for dyspnea at rest, dyspnea on exertion, cough, sputum, wheezing.  GI: See history of present illness. GU:  Negative for dysuria, hematuria, urinary incontinence, urinary frequency, nocturnal urination.  Endo: Negative for unusual weight change.     Physical Exam   BP 130/78 (BP Location: Right Arm, Patient Position: Sitting, Cuff Size: Large)   Pulse (!) 55   Temp 98.1 F (36.7 C) (Temporal)   Ht 5' 8.5" (1.74 m)   Wt 199 lb 6.4 oz (90.4 kg)   BMI 29.88 kg/m    General: Well-nourished, well-developed in no acute distress.  Eyes: No icterus. Mouth: Oropharyngeal mucosa moist and pink  Abdomen: Bowel sounds are normal, nontender, nondistended, no hepatosplenomegaly or masses,  no abdominal bruits or  hernia , no rebound or guarding.  Rectal: not performed  Extremities: No lower extremity edema. No clubbing or deformities. Neuro: Alert and oriented x 4   Skin: Warm and dry, no jaundice.   Psych: Alert and cooperative, normal mood and affect.  Labs   See hpi  Imaging Studies   No results found.  Assessment/Plan:   GERD: doing well -continue pantoprazole  once to twice daily  Constipation: doing well -continue miralax once capful once daily as needed -consume 96 ounces of water  daily  Elevated LFTs: initially mildly elevated AST/ALT (51/58) and AP (157). Platelets in 140K  range. Work up with weakly positive TTG IgA (5), smooth muscle ab positive. AST/ALT now normal. AP near normal at 128. -await celiac disease HLA testing. If HLA negative, no additional work up needed. If he has one gene for celiac, consider monitoring TTG IgA given weakly positive. If two genes, then consider EGD with small bowel biopsy in the future -will continue to monitor LFTs, further recommendations to follow celiac disease HLA testing results.       Trudie Fuse. Harles Lied, MHS, PA-C [facility]

## 2024-04-19 ENCOUNTER — Other Ambulatory Visit: Payer: Self-pay

## 2024-04-19 DIAGNOSIS — R7989 Other specified abnormal findings of blood chemistry: Secondary | ICD-10-CM

## 2024-04-19 LAB — HEPATIC FUNCTION PANEL
ALT: 26 IU/L (ref 0–44)
AST: 26 IU/L (ref 0–40)
Albumin: 4.4 g/dL (ref 3.8–4.8)
Alkaline Phosphatase: 128 IU/L — ABNORMAL HIGH (ref 44–121)
Bilirubin Total: 0.5 mg/dL (ref 0.0–1.2)
Bilirubin, Direct: 0.19 mg/dL (ref 0.00–0.40)
Total Protein: 6.7 g/dL (ref 6.0–8.5)

## 2024-04-19 LAB — CELIAC DISEASE HLA DQ ASSOC.
DQ2 (DQA1 0501/0505,DQB1 02XX): NEGATIVE
DQ8 (DQA1 03XX, DQB1 0302): NEGATIVE

## 2024-04-22 ENCOUNTER — Other Ambulatory Visit: Payer: Self-pay | Admitting: Psychiatry

## 2024-04-22 DIAGNOSIS — F5105 Insomnia due to other mental disorder: Secondary | ICD-10-CM

## 2024-04-22 DIAGNOSIS — F331 Major depressive disorder, recurrent, moderate: Secondary | ICD-10-CM

## 2024-04-23 ENCOUNTER — Other Ambulatory Visit: Payer: Self-pay | Admitting: Psychiatry

## 2024-04-23 DIAGNOSIS — F331 Major depressive disorder, recurrent, moderate: Secondary | ICD-10-CM

## 2024-04-23 DIAGNOSIS — F5105 Insomnia due to other mental disorder: Secondary | ICD-10-CM

## 2024-04-24 ENCOUNTER — Other Ambulatory Visit: Payer: Self-pay | Admitting: Psychiatry

## 2024-04-24 DIAGNOSIS — F331 Major depressive disorder, recurrent, moderate: Secondary | ICD-10-CM

## 2024-04-24 DIAGNOSIS — F5105 Insomnia due to other mental disorder: Secondary | ICD-10-CM

## 2024-04-27 ENCOUNTER — Telehealth: Payer: Self-pay | Admitting: Psychiatry

## 2024-04-27 NOTE — Telephone Encounter (Signed)
 Pt reports not sleeping with Seroquel . He said he takes it at 8 PM and is awake until at least midnight. He said he knows he sleeps because he dreams, but doesn't feel rested. Reviewed sleep hygiene. The only positive is he watches 3 programs with his disabled son and then goes to bed. He said he is under a lot of stress, but no new stressors. I saw that [birth year] is his birthday and wished him an early HBD. He said he was going to a friend's funeral. He is asking if he can try trazodone  again. It looks like he was last on it earlier this year at 150 mg.

## 2024-04-27 NOTE — Telephone Encounter (Signed)
 Pt called 9:17 am reporting the Quetiapine  not helping sleep. Takes  a while to fall a sleep. Then, he wakes up. Asking if Trazodone  possible. Contact [phone]  Apt 6/2

## 2024-04-30 ENCOUNTER — Other Ambulatory Visit: Payer: Self-pay

## 2024-04-30 MED ORDER — TRAZODONE HCL 150 MG PO TABS: 150.0000 mg | ORAL_TABLET | Freq: Every day | ORAL | 0 refills | Status: DC

## 2024-04-30 NOTE — Telephone Encounter (Signed)
 Patient notified to stop Seroquel  and could take trazodone . Sent in Rx for trazodone .

## 2024-04-30 NOTE — Telephone Encounter (Signed)
 Ok.  DC quetiapine  and return to trazodone  150 mg HS.  Send RX for 90 day if needed.

## 2024-05-03 ENCOUNTER — Ambulatory Visit: Admitting: Psychiatry

## 2024-05-08 DIAGNOSIS — H401131 Primary open-angle glaucoma, bilateral, mild stage: Secondary | ICD-10-CM | POA: Diagnosis not present

## 2024-05-08 DIAGNOSIS — H26493 Other secondary cataract, bilateral: Secondary | ICD-10-CM | POA: Diagnosis not present

## 2024-05-16 DIAGNOSIS — E538 Deficiency of other specified B group vitamins: Secondary | ICD-10-CM | POA: Diagnosis not present

## 2024-05-16 DIAGNOSIS — E7849 Other hyperlipidemia: Secondary | ICD-10-CM | POA: Diagnosis not present

## 2024-05-16 DIAGNOSIS — R739 Hyperglycemia, unspecified: Secondary | ICD-10-CM | POA: Diagnosis not present

## 2024-05-16 DIAGNOSIS — Z0001 Encounter for general adult medical examination with abnormal findings: Secondary | ICD-10-CM | POA: Diagnosis not present

## 2024-05-16 DIAGNOSIS — I10 Essential (primary) hypertension: Secondary | ICD-10-CM | POA: Diagnosis not present

## 2024-05-16 DIAGNOSIS — E119 Type 2 diabetes mellitus without complications: Secondary | ICD-10-CM | POA: Diagnosis not present

## 2024-05-16 DIAGNOSIS — R7989 Other specified abnormal findings of blood chemistry: Secondary | ICD-10-CM | POA: Diagnosis not present

## 2024-05-16 DIAGNOSIS — R5383 Other fatigue: Secondary | ICD-10-CM | POA: Diagnosis not present

## 2024-05-21 ENCOUNTER — Encounter: Payer: Self-pay | Admitting: Psychiatry

## 2024-05-21 ENCOUNTER — Ambulatory Visit (INDEPENDENT_AMBULATORY_CARE_PROVIDER_SITE_OTHER): Admitting: Psychiatry

## 2024-05-21 DIAGNOSIS — F411 Generalized anxiety disorder: Secondary | ICD-10-CM

## 2024-05-21 DIAGNOSIS — F341 Dysthymic disorder: Secondary | ICD-10-CM | POA: Diagnosis not present

## 2024-05-21 DIAGNOSIS — F5105 Insomnia due to other mental disorder: Secondary | ICD-10-CM | POA: Diagnosis not present

## 2024-05-21 DIAGNOSIS — F331 Major depressive disorder, recurrent, moderate: Secondary | ICD-10-CM

## 2024-05-21 MED ORDER — ZALEPLON 10 MG PO CAPS
10.0000 mg | ORAL_CAPSULE | Freq: Every evening | ORAL | 0 refills | Status: DC | PRN
Start: 2024-05-21 — End: 2024-07-26
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
## 2024-05-29 DIAGNOSIS — L820 Inflamed seborrheic keratosis: Secondary | ICD-10-CM | POA: Diagnosis not present

## 2024-05-29 DIAGNOSIS — L570 Actinic keratosis: Secondary | ICD-10-CM | POA: Diagnosis not present

## 2024-05-29 DIAGNOSIS — X32XXXD Exposure to sunlight, subsequent encounter: Secondary | ICD-10-CM | POA: Diagnosis not present

## 2024-05-30 DIAGNOSIS — I10 Essential (primary) hypertension: Secondary | ICD-10-CM | POA: Diagnosis not present

## 2024-05-30 DIAGNOSIS — Z6828 Body mass index (BMI) 28.0-28.9, adult: Secondary | ICD-10-CM | POA: Diagnosis not present

## 2024-05-30 DIAGNOSIS — Z85038 Personal history of other malignant neoplasm of large intestine: Secondary | ICD-10-CM | POA: Diagnosis not present

## 2024-05-30 DIAGNOSIS — F331 Major depressive disorder, recurrent, moderate: Secondary | ICD-10-CM | POA: Diagnosis not present

## 2024-05-30 DIAGNOSIS — E782 Mixed hyperlipidemia: Secondary | ICD-10-CM | POA: Diagnosis not present

## 2024-05-30 DIAGNOSIS — E7849 Other hyperlipidemia: Secondary | ICD-10-CM | POA: Diagnosis not present

## 2024-06-01 ENCOUNTER — Telehealth: Payer: Self-pay | Admitting: Psychiatry

## 2024-06-01 ENCOUNTER — Other Ambulatory Visit: Payer: Self-pay

## 2024-06-01 DIAGNOSIS — F411 Generalized anxiety disorder: Secondary | ICD-10-CM

## 2024-06-01 NOTE — Telephone Encounter (Signed)
 Pt called to states he needs a refill on alprazolam  0.5 mg. Also states the meds are not helping him sleep including Trazodone .

## 2024-06-01 NOTE — Telephone Encounter (Signed)
 Rx for alprazolam  pended and sleeping issues noted in RF request.

## 2024-06-04 ENCOUNTER — Other Ambulatory Visit: Payer: Self-pay | Admitting: Psychiatry

## 2024-06-04 ENCOUNTER — Telehealth: Payer: Self-pay | Admitting: Psychiatry

## 2024-06-04 DIAGNOSIS — F411 Generalized anxiety disorder: Secondary | ICD-10-CM

## 2024-06-04 MED ORDER — ALPRAZOLAM 0.5 MG PO TABS: ORAL_TABLET | ORAL | 0 refills | Status: DC

## 2024-06-04 NOTE — Telephone Encounter (Signed)
 Sent RF request with message about increasing alprazolam .  RF still pending.

## 2024-06-04 NOTE — Telephone Encounter (Signed)
 He did not pick up the last prescription.  The last prescription for alprazolam  was picked up a month ago so it is due. Tell him we can try alprazolam  0.5 mg tablets 1 twice a day as needed for anxiety and 2 tablets at night.  I am only going to send in a 2-week supply because if that does not work then we will need to switch to a different medication.  If that works then I will give him a month's supply.

## 2024-06-04 NOTE — Telephone Encounter (Signed)
 I sent a RF request for alprazolam  last week with message. Patient said he sleeps with two alprazolam , but that only leaves him one for the rest of the day. He is asking for more.

## 2024-06-04 NOTE — Telephone Encounter (Signed)
 Pt lvm stating hasn't slept in 2 nights. Brain will not cut off. Asking if Alprazolam  would help with sleep. RTC [phone]

## 2024-06-05 NOTE — Telephone Encounter (Signed)
 Pt reported sleeping well last night with 2 alprazolam . Told him to touch base next week and let us  know how it is working.

## 2024-06-11 ENCOUNTER — Encounter: Payer: Self-pay | Admitting: Gastroenterology

## 2024-06-25 ENCOUNTER — Telehealth: Payer: Self-pay | Admitting: Psychiatry

## 2024-06-25 ENCOUNTER — Other Ambulatory Visit: Payer: Self-pay

## 2024-06-25 DIAGNOSIS — F411 Generalized anxiety disorder: Secondary | ICD-10-CM

## 2024-06-25 MED ORDER — ALPRAZOLAM 0.5 MG PO TABS: ORAL_TABLET | ORAL | 0 refills | Status: DC

## 2024-06-25 NOTE — Telephone Encounter (Signed)
 Pended alprazolam  RF to Dr. Geoffry for his review.

## 2024-06-25 NOTE — Telephone Encounter (Signed)
 Patient lvm at 10:31 stating that Aprazloam 0.5mg  is working and his sleeping through the night. He needs a refill for Alprazolam  sent to [facility] [address] [place], [place] Ph: [phone] Appt 8/7

## 2024-07-26 ENCOUNTER — Encounter: Payer: Self-pay | Admitting: Psychiatry

## 2024-07-26 ENCOUNTER — Ambulatory Visit (INDEPENDENT_AMBULATORY_CARE_PROVIDER_SITE_OTHER): Admitting: Psychiatry

## 2024-07-26 DIAGNOSIS — F411 Generalized anxiety disorder: Secondary | ICD-10-CM

## 2024-07-26 DIAGNOSIS — F902 Attention-deficit hyperactivity disorder, combined type: Secondary | ICD-10-CM

## 2024-07-26 DIAGNOSIS — F5105 Insomnia due to other mental disorder: Secondary | ICD-10-CM | POA: Diagnosis not present

## 2024-07-26 DIAGNOSIS — F341 Dysthymic disorder: Secondary | ICD-10-CM

## 2024-07-26 DIAGNOSIS — F331 Major depressive disorder, recurrent, moderate: Secondary | ICD-10-CM | POA: Diagnosis not present

## 2024-07-26 MED ORDER — TRAZODONE HCL 150 MG PO TABS: 150.0000 mg | ORAL_TABLET | Freq: Every day | ORAL | 1 refills | Status: DC

## 2024-07-26 MED ORDER — ALPRAZOLAM 0.5 MG PO TABS: ORAL_TABLET | ORAL | 3 refills | Status: DC

## 2024-07-26 MED ORDER — DONEPEZIL HCL 10 MG PO TABS: 10.0000 mg | ORAL_TABLET | Freq: Every day | ORAL | 3 refills | Status: DC

## 2024-07-26 MED ORDER — SERTRALINE HCL 100 MG PO TABS: ORAL_TABLET | ORAL | 0 refills | Status: DC

## 2024-07-26 NOTE — Progress Notes (Addendum)
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 not support an association with dementia.  Plan: continue Med:  trazodone  150HS & 2 tylenol  pm HS, alprazolam  0.5 mg or 1 mg HS and 0.5 mg prn anxiety, donepezil  10 HS, no Seroquel  Retry sertraline  at higher dose 50-100 mg daily  This appt was 30 mins.  FU 8 weeks  Lorene Macintosh, MD, DFAPA   Please see After Visit Summary for patient specific instructions.  Future Appointments  Date Time Provider Department Center  09/27/2024 10:00 AM Cottle, Lorene KANDICE Raddle., MD [facility] None         No orders of the defined types were placed in this encounter.      -------------------------------

## 2024-07-27 ENCOUNTER — Other Ambulatory Visit: Payer: Self-pay | Admitting: Psychiatry

## 2024-07-27 DIAGNOSIS — F5105 Insomnia due to other mental disorder: Secondary | ICD-10-CM

## 2024-08-06 ENCOUNTER — Other Ambulatory Visit: Payer: Self-pay

## 2024-08-06 DIAGNOSIS — R7989 Other specified abnormal findings of blood chemistry: Secondary | ICD-10-CM

## 2024-08-23 ENCOUNTER — Other Ambulatory Visit: Payer: Self-pay | Admitting: Psychiatry

## 2024-08-23 DIAGNOSIS — F5105 Insomnia due to other mental disorder: Secondary | ICD-10-CM

## 2024-08-27 DIAGNOSIS — R7989 Other specified abnormal findings of blood chemistry: Secondary | ICD-10-CM | POA: Diagnosis not present

## 2024-08-28 ENCOUNTER — Ambulatory Visit: Admitting: Psychiatry

## 2024-08-29 ENCOUNTER — Encounter (INDEPENDENT_AMBULATORY_CARE_PROVIDER_SITE_OTHER): Payer: Self-pay | Admitting: *Deleted

## 2024-08-29 DIAGNOSIS — G4733 Obstructive sleep apnea (adult) (pediatric): Secondary | ICD-10-CM | POA: Diagnosis not present

## 2024-08-30 DIAGNOSIS — Z0001 Encounter for general adult medical examination with abnormal findings: Secondary | ICD-10-CM | POA: Diagnosis not present

## 2024-08-30 DIAGNOSIS — I10 Essential (primary) hypertension: Secondary | ICD-10-CM | POA: Diagnosis not present

## 2024-08-30 DIAGNOSIS — E7849 Other hyperlipidemia: Secondary | ICD-10-CM | POA: Diagnosis not present

## 2024-08-30 DIAGNOSIS — R7989 Other specified abnormal findings of blood chemistry: Secondary | ICD-10-CM | POA: Diagnosis not present

## 2024-08-30 DIAGNOSIS — E119 Type 2 diabetes mellitus without complications: Secondary | ICD-10-CM | POA: Diagnosis not present

## 2024-08-30 DIAGNOSIS — R739 Hyperglycemia, unspecified: Secondary | ICD-10-CM | POA: Diagnosis not present

## 2024-08-30 LAB — ALKALINE PHOSPHATASE, ISOENZYMES
BONE FRACTION: 36 (ref 12–68)
INTESTINAL FRAC.: 22 — AB (ref 0–18)
LIVER FRACTION: 42 (ref 13–88)

## 2024-08-30 LAB — CBC WITH DIFFERENTIAL/PLATELET
Basophils Absolute: 0 x10E3/uL (ref 0.0–0.2)
Basos: 0 %
EOS (ABSOLUTE): 0.1 x10E3/uL (ref 0.0–0.4)
Eos: 2 %
Hematocrit: 45.3 % (ref 37.5–51.0)
Hemoglobin: 15.1 g/dL (ref 13.0–17.7)
Immature Grans (Abs): 0 x10E3/uL (ref 0.0–0.1)
Immature Granulocytes: 0 %
Lymphocytes Absolute: 1.1 x10E3/uL (ref 0.7–3.1)
Lymphs: 19 %
MCH: 31.3 pg (ref 26.6–33.0)
MCHC: 33.3 g/dL (ref 31.5–35.7)
MCV: 94 fL (ref 79–97)
Monocytes Absolute: 0.8 x10E3/uL (ref 0.1–0.9)
Monocytes: 13 %
Neutrophils Absolute: 3.8 x10E3/uL (ref 1.4–7.0)
Neutrophils: 66 %
Platelets: 136 x10E3/uL — ABNORMAL LOW (ref 150–450)
RBC: 4.83 x10E6/uL (ref 4.14–5.80)
RDW: 13.1 % (ref 11.6–15.4)
WBC: 5.8 x10E3/uL (ref 3.4–10.8)

## 2024-08-30 LAB — HEPATIC FUNCTION PANEL
ALT: 20 IU/L (ref 0–44)
AST: 20 IU/L (ref 0–40)
Albumin: 4.2 g/dL (ref 3.8–4.8)
Alkaline Phosphatase: 123 IU/L — ABNORMAL HIGH (ref 44–121)
Bilirubin Total: 0.4 mg/dL (ref 0.0–1.2)
Bilirubin, Direct: 0.12 mg/dL (ref 0.00–0.40)
Total Protein: 6.5 g/dL (ref 6.0–8.5)

## 2024-08-30 LAB — ANTI-SMOOTH MUSCLE ANTIBODY, IGG: Smooth Muscle Ab: 33 U — ABNORMAL HIGH (ref 0–19)

## 2024-08-30 LAB — GAMMA GT: GGT: 17 IU/L (ref 0–65)

## 2024-09-04 DIAGNOSIS — Z23 Encounter for immunization: Secondary | ICD-10-CM | POA: Diagnosis not present

## 2024-09-04 DIAGNOSIS — E782 Mixed hyperlipidemia: Secondary | ICD-10-CM | POA: Diagnosis not present

## 2024-09-04 DIAGNOSIS — Z85038 Personal history of other malignant neoplasm of large intestine: Secondary | ICD-10-CM | POA: Diagnosis not present

## 2024-09-04 DIAGNOSIS — F331 Major depressive disorder, recurrent, moderate: Secondary | ICD-10-CM | POA: Diagnosis not present

## 2024-09-04 DIAGNOSIS — E7849 Other hyperlipidemia: Secondary | ICD-10-CM | POA: Diagnosis not present

## 2024-09-04 DIAGNOSIS — I10 Essential (primary) hypertension: Secondary | ICD-10-CM | POA: Diagnosis not present

## 2024-09-04 DIAGNOSIS — Z6830 Body mass index (BMI) 30.0-30.9, adult: Secondary | ICD-10-CM | POA: Diagnosis not present

## 2024-09-07 ENCOUNTER — Ambulatory Visit: Payer: Self-pay | Admitting: Gastroenterology

## 2024-09-10 DIAGNOSIS — H401131 Primary open-angle glaucoma, bilateral, mild stage: Secondary | ICD-10-CM | POA: Diagnosis not present

## 2024-09-24 ENCOUNTER — Encounter: Payer: Self-pay | Admitting: Internal Medicine

## 2024-09-24 ENCOUNTER — Other Ambulatory Visit: Payer: Self-pay | Admitting: *Deleted

## 2024-09-24 ENCOUNTER — Ambulatory Visit: Admitting: Internal Medicine

## 2024-09-24 VITALS — BP 134/74 | HR 60 | Temp 97.6°F | Ht 69.0 in | Wt 212.2 lb

## 2024-09-24 DIAGNOSIS — R748 Abnormal levels of other serum enzymes: Secondary | ICD-10-CM

## 2024-09-24 DIAGNOSIS — K219 Gastro-esophageal reflux disease without esophagitis: Secondary | ICD-10-CM | POA: Diagnosis not present

## 2024-09-24 DIAGNOSIS — R7989 Other specified abnormal findings of blood chemistry: Secondary | ICD-10-CM

## 2024-09-24 NOTE — Patient Instructions (Signed)
 In 2 months c-Met, CBC INR and ELF   Go ahead and proceed with a FibroScan to look for occult fibrosis   Surveillance colonoscopy in 5 years   Continue omeprazole  daily for GERD   Possible hematology evaluation in the future.   Telephone follow-up.

## 2024-09-24 NOTE — Progress Notes (Signed)
 Gastroenterology Progress Note    Primary Care Physician:  Jolee Greig VEAR DEVONNA Primary Gastroenterologist:  Dr. Shaaron  Pre-Procedure History & Physical: HPI:  [name] is a 73 y.o. male here for here for follow-up of isolated elevated alkaline phosphatase (fractionation suggests intestinal origin).  Last assay only couple of points above normal LFTs normal anti-smooth muscle antibody positive antimitochondrial antibody negative extensive serologies negative.  Slightly positive TTG but DQ alleles both negative which essentially rules out celiac disease.  Platelet count repeatedly below just below the lower limit of normal lastly in the 140K range.  No evidence of portal hypertension on cross-sectional imaging or suggestion of steatossis.  This gentleman has long standing history of glucose intolerance/type 2 diabetes mellitus.  Significant obesity; did weigh in the 230s: Now 212. Drinks a sip of rum at night to help him sleep he states this is very infrequent.  History of anastomotic colonic ulcer from prior colon cancer surgery biopsies negative.  History of esophageal nodule biopsy/EUS negative.  Anal nodule biopsies benign.  Surveillance colonoscopy 5 years from now due to history of colon cancer   Past Medical History:  Diagnosis Date   Anemia    hx of    Arthritis    fingers    BPH (benign prostatic hyperplasia)    Colon cancer (HCC)    stage II, diagnosed 2007   Complication of anesthesia    vagal response after umbilcal hernia at [facility]   Depression    Diabetes mellitus    no as of 09/08/16 due to weight loss    GERD (gastroesophageal reflux disease)    Glaucoma 2012   H. pylori infection 10/2015   Documented eradication March 2017   History of kidney stones    Sleep apnea    wears CiPaP at night   Staph infection 2008   left side of face    Past Surgical History:  Procedure Laterality Date   AGILE CAPSULE N/A 10/29/2019   Procedure: AGILE CAPSULE;   Surgeon: Shaaron Lamar HERO, MD;  Location: [facility];  Service: Endoscopy;  Laterality: N/A;  7:30am   APPENDECTOMY     BIOPSY  10/11/2019   Procedure: BIOPSY;  Surgeon: Shaaron Lamar HERO, MD;  [facility]: [facility];  Service: Endoscopy;;  colon   BIOPSY  12/29/2023   Procedure: BIOPSY;  Surgeon: Shaaron Lamar HERO, MD;  [facility]: [facility];  Service: Endoscopy;;   CARDIAC CATHETERIZATION     COLECTOMY  2007   right hemicolectomy   COLONOSCOPY  05/11/10   normal rectum,normal residual colon with some angry appearring anastomtic mucosa with some erosions and oozing, bx unremarkable   COLONOSCOPY  05/16/2012   Dr. Shaaron- normal rectum, sigmoid diverticulosis, anastomotic ulcers. bx= acute ulcer and benign colonic mucosa with intramucosal lympoid aggregates   COLONOSCOPY N/A 11/17/2016   Dr. Shaaron: Ulcerated mucosa were present at the anastomosis. benign bx. next TCS in 10/2021.   COLONOSCOPY WITH PROPOFOL  N/A 10/11/2019   Dr. Shaaron: Diverticulosis in the sigmoid colon and descending colon.  Focal ulcerations distal neoterminal ileum uncertain significance.  Biopsies were benign with no evidence of malignancy.  Givens capsule completed after colonoscopy.  5-year surveillance colonoscopy advised.   COLONOSCOPY WITH PROPOFOL  N/A 12/29/2023   Procedure: COLONOSCOPY WITH PROPOFOL ;  Surgeon: Shaaron Lamar HERO, MD;  [facility]: [facility];  Service: Endoscopy;  Laterality: N/A;  1215pm, asa 2   ESOPHAGEAL DILATION N/A 10/28/2015   Procedure: ESOPHAGEAL DILATION;  Surgeon:  Lamar CHRISTELLA Hollingshead, MD;  [facility]: [facility];  Service: Endoscopy;  Laterality: N/A;   ESOPHAGOGASTRODUODENOSCOPY N/A 10/28/2015   RMR: ulcerated proximal esophageal mass ad described status post biopsy. Abnormal distal esophagus biopsy. status post biopsy. Hiatal hernia . abnormal gastric mucosa doubt clinical significance status post biopsy   ESOPHAGOGASTRODUODENOSCOPY (EGD) WITH PROPOFOL  N/A 11/07/2017   Dr. Hollingshead: Normal study.      ESOPHAGOGASTRODUODENOSCOPY (EGD) WITH PROPOFOL  N/A 12/29/2023   Procedure: ESOPHAGOGASTRODUODENOSCOPY (EGD) WITH PROPOFOL ;  Surgeon: Hollingshead Lamar CHRISTELLA, MD;  [facility];  Service: Endoscopy;  Laterality: N/A;   EUS N/A 11/20/2015   Procedure: UPPER ENDOSCOPIC ULTRASOUND (EUS) LINEAR;  Surgeon: Toribio SHAUNNA Cedar, MD;  Location: [facility];  Service: Endoscopy;  Laterality: N/A;   GIVENS CAPSULE STUDY N/A 11/12/2019   Scattered erosions throughout the small bowel.  Ulceration again seen at the anastomosis.  No active bleeding.  Question ischemia   HEMORRHOID SURGERY  12/31/2011   Procedure: HEMORRHOIDECTOMY;  Surgeon: Oneil DELENA Budge;  Location: [facility];  Service: General;  Laterality: N/A;   HERNIA REPAIR     umbilical   KIDNEY STONE SURGERY     MALONEY DILATION N/A 12/29/2023   Procedure: MALONEY DILATION;  Surgeon: Hollingshead Lamar CHRISTELLA, MD;  [facility]: [facility];  Service: Endoscopy;  Laterality: N/A;   PILONIDAL CYST / SINUS EXCISION  1976   SKIN LESION EXCISION     left shoulder/pre cancerous   TRANSURETHRAL RESECTION OF PROSTATE N/A 09/09/2016   Procedure: TRANSURETHRAL RESECTION OF THE PROSTATE (TURP);  Surgeon: Norleen Seltzer, MD;  Location: [facility];  Service: Urology;  Laterality: N/A;    Prior to Admission medications   Medication Sig Start Date End Date Taking? Authorizing Provider  ALPRAZolam  (XANAX ) 0.5 MG tablet 1 tablet twice daily as needed for anxiety and 2 tablets at night 07/26/24  Yes Cottle, Lorene KANDICE Raddle., MD  aspirin  EC 81 MG tablet Take 81 mg by mouth daily. Swallow whole.   Yes [provider]  atorvastatin (LIPITOR) 10 MG tablet Take 10 mg by mouth daily. 08/05/20  Yes [provider]  brimonidine  (ALPHAGAN ) 0.2 % ophthalmic solution Place 1 drop into both eyes 2 (two) times daily.    Yes [provider]  donepezil  (ARICEPT ) 10 MG tablet TAKE 1 TABLET(10 MG) BY MOUTH AT BEDTIME 08/23/24  Yes Cottle, Lorene KANDICE Raddle., MD  dorzolamide -timolol  (COSOPT ) 22.3-6.8  MG/ML ophthalmic solution Place 1 drop into both eyes 2 (two) times daily.   Yes [provider]  latanoprost  (XALATAN ) 0.005 % ophthalmic solution 1 drop at bedtime. 05/15/22  Yes [provider]  metFORMIN (GLUCOPHAGE-XR) 500 MG 24 hr tablet Take 500 mg by mouth daily. 02/12/24  Yes [provider]  Multiple Vitamin (MULTIVITAMIN) tablet Take 1 tablet by mouth daily.   Yes [provider]  pantoprazole  (PROTONIX ) 40 MG tablet Take 1 tablet (40 mg total) by mouth 2 (two) times daily before a meal. 12/29/23  Yes Jennalee Greaves, Lamar CHRISTELLA, MD  sertraline  (ZOLOFT ) 100 MG tablet 1/2 tablet daily for a week then 1 daily 07/26/24  Yes Cottle, Lorene KANDICE Raddle., MD  traZODone  (DESYREL ) 150 MG tablet Take 1 tablet (150 mg total) by mouth at bedtime. 07/26/24  Yes Cottle, Lorene KANDICE Raddle., MD  vitamin B-12 (CYANOCOBALAMIN ) 500 MCG tablet Take 500 mcg by mouth every evening.   Yes [provider]    Allergies as of 09/24/2024 - Review Complete 09/24/2024  Allergen Reaction Noted   Ambien [  zolpidem tartrate]  09/12/2018   Bactrim [sulfamethoxazole-trimethoprim] Hives and Swelling 09/08/2016   Dilaudid [hydromorphone hcl] Other (See Comments) 09/08/2016   Morphine  and codeine Nausea And Vomiting 09/08/2016    Family History  Problem Relation Age of Onset   Lung cancer Father    Diabetes Mother    Anesthesia problems Neg Hx    Hypotension Neg Hx    Malignant hyperthermia Neg Hx    Pseudochol deficiency Neg Hx    Colon cancer Neg Hx     Social History   Socioeconomic History   Marital status: Married    Spouse name: Not on file   Number of children: 1   Years of education: Not on file   Highest education level: Not on file  Occupational History   Occupation: school system    Employer: [employer]  Tobacco Use   Smoking status: Never   Smokeless tobacco: Never   Tobacco comments:    Never smoked  Vaping Use   Vaping status: Never Used  Substance and Sexual  Activity   Alcohol  use: No    Alcohol /week: 0.0 standard drinks of alcohol    Drug use: No   Sexual activity: Yes    Birth control/protection: None  Other Topics Concern   Not on file  Social History Narrative   Not on file   Social Drivers of Health   Financial Resource Strain: Not on file  Food Insecurity: Not on file  Transportation Needs: Not on file  Physical Activity: Not on file  Stress: Not on file  Social Connections: Not on file  Intimate Partner Violence: Not on file    Review of Systems   See HPI, otherwise negative ROS  Physical Exam: BP 134/74 (BP Location: Left Arm, Patient Position: Sitting, Cuff Size: Large)   Pulse 60   Temp 97.6 F (36.4 C) (Oral)   Ht 5' 9 (1.753 m)   Wt 212 lb 3.2 oz (96.3 kg)   SpO2 97%   BMI 31.34 kg/m  General:   Alert,  Well-developed, well-nourished, pleasant and cooperative in NAD Neck:  Supple; no masses or thyromegaly. No significant cervical adenopathy. Lungs:  Clear throughout to auscultation.   No wheezes, crackles, or rhonchi. No acute distress. Heart:  Regular rate and rhythm; no murmurs, clicks, rubs,  or gallops. Abdomen: Non-distended, normal bowel sounds.  Soft and nontender without appreciable mass or hepatosplenomegaly.    Impression/Plan:   Very pleasant 73 year old gentleman with a rather extensive GI history in the past with his active issues being now GERD-well-controlled, mildly depressed platelet count, history mildly elevated transaminases resolved and persistently elevated alkaline phosphatase which is almost normal now fractionation suggested intestinal origin which I feel is somewhat nonspecific.  He has repeatedly noted to have a mildly low platelet count.  Etiology is not clear.  From a hepatology standpoint, no evidence of portal hypertension on cross-sectional imaging.  He historically has been significantly obese, has type 2 diabetes mellitus and takes an occasional sip of rum.  I told him  ultimately, he may need to see the hematologist.  Recommendations  In 2 months c-Met, CBC INR and ELF  Go ahead and proceed with a FibroScan to look for occult fibrosis  Surveillance colonoscopy in 5 years  Continue omeprazole  daily for GERD  Possible hematology evaluation in the future.  Telephone follow-up.    Notice: This dictation was prepared with Dragon dictation along with smaller phrase technology. Any transcriptional errors that result from this process are  unintentional and may not be corrected upon review.

## 2024-09-26 DIAGNOSIS — H6122 Impacted cerumen, left ear: Secondary | ICD-10-CM | POA: Diagnosis not present

## 2024-09-26 DIAGNOSIS — J302 Other seasonal allergic rhinitis: Secondary | ICD-10-CM | POA: Diagnosis not present

## 2024-09-27 ENCOUNTER — Ambulatory Visit (INDEPENDENT_AMBULATORY_CARE_PROVIDER_SITE_OTHER): Admitting: Psychiatry

## 2024-09-27 ENCOUNTER — Encounter: Payer: Self-pay | Admitting: Psychiatry

## 2024-09-27 DIAGNOSIS — Z630 Problems in relationship with spouse or partner: Secondary | ICD-10-CM

## 2024-09-27 DIAGNOSIS — F331 Major depressive disorder, recurrent, moderate: Secondary | ICD-10-CM

## 2024-09-27 DIAGNOSIS — F411 Generalized anxiety disorder: Secondary | ICD-10-CM

## 2024-09-27 DIAGNOSIS — F902 Attention-deficit hyperactivity disorder, combined type: Secondary | ICD-10-CM | POA: Diagnosis not present

## 2024-09-27 DIAGNOSIS — F5105 Insomnia due to other mental disorder: Secondary | ICD-10-CM

## 2024-09-27 DIAGNOSIS — F341 Dysthymic disorder: Secondary | ICD-10-CM

## 2024-09-27 NOTE — Patient Instructions (Addendum)
 Reduce sertraline  to 1/2 tablet daily for 10 days and stop it.   If you get vertigo symptoms after stopping let us  know and we'll add back 1/4 tablet  for a week and stop it again.  Start Caplyta 1 at night.

## 2024-09-27 NOTE — Progress Notes (Signed)
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 withdrawal symptoms, and the potential eventual dose-related risk of dementia.  But recent studies from 2020 dispute this association between benzodiazepines and dementia risk. Newer studies in 2020 do not support an association with dementia.  Options Latuda at 40 mg or above, Caplyta  Plan: continue Med:  trazodone  150HS & 2 tylenol  pm HS, alprazolam  0.5 mg or 1 mg HS and 0.5 mg prn anxiety, donepezil  10 HS, no Seroquel   Failed resp sertraline  100 so wean off.   Reduce sertraline  to 1/2 tablet daily for 10 days and stop it.   If you get vertigo symptoms after stopping let us  know and we'll add back 1/4 tablet  for a week and stop it again.  Trial Caplyta off label bc [facility] of alternatives.  This appt was 30 mins.  FU 8 weeks  Lorene Macintosh, MD, DFAPA   Please see After Visit Summary for patient specific instructions.  No future appointments.        No orders of the defined  types were placed in this encounter.      -------------------------------

## 2024-10-22 ENCOUNTER — Other Ambulatory Visit: Payer: Self-pay | Admitting: Psychiatry

## 2024-10-22 DIAGNOSIS — F411 Generalized anxiety disorder: Secondary | ICD-10-CM

## 2024-10-22 DIAGNOSIS — F331 Major depressive disorder, recurrent, moderate: Secondary | ICD-10-CM

## 2024-10-23 NOTE — Telephone Encounter (Signed)
NR

## 2024-10-23 NOTE — Telephone Encounter (Signed)
 Last note says to wean off and start Caplyta. Shows last RF as 10/28. Is he still taking?

## 2024-10-25 ENCOUNTER — Other Ambulatory Visit: Payer: Self-pay | Admitting: Psychiatry

## 2024-10-25 DIAGNOSIS — F5105 Insomnia due to other mental disorder: Secondary | ICD-10-CM

## 2024-10-26 ENCOUNTER — Ambulatory Visit (INDEPENDENT_AMBULATORY_CARE_PROVIDER_SITE_OTHER): Admitting: Psychiatry

## 2024-10-26 ENCOUNTER — Encounter: Payer: Self-pay | Admitting: Psychiatry

## 2024-10-26 DIAGNOSIS — F411 Generalized anxiety disorder: Secondary | ICD-10-CM | POA: Diagnosis not present

## 2024-10-26 DIAGNOSIS — F09 Unspecified mental disorder due to known physiological condition: Secondary | ICD-10-CM | POA: Diagnosis not present

## 2024-10-26 DIAGNOSIS — F341 Dysthymic disorder: Secondary | ICD-10-CM

## 2024-10-26 DIAGNOSIS — F331 Major depressive disorder, recurrent, moderate: Secondary | ICD-10-CM

## 2024-10-26 DIAGNOSIS — F902 Attention-deficit hyperactivity disorder, combined type: Secondary | ICD-10-CM | POA: Diagnosis not present

## 2024-10-26 DIAGNOSIS — F5105 Insomnia due to other mental disorder: Secondary | ICD-10-CM | POA: Diagnosis not present

## 2024-10-26 MED ORDER — MEMANTINE HCL 10 MG PO TABS: ORAL_TABLET | ORAL | 1 refills | Status: DC

## 2024-10-26 NOTE — Progress Notes (Signed)
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 multiple AD.     Ongoing dep  Xanax  0.5 mg bid prn anxiety and 1.0 mg HS.   Chronic attention problems remain and he is failed multiple ADD meds including off label things such as modafinil and Nuvigil .  His wife complains of his ADD symptoms as well.    Consider Namenda off label  continue donepezil  10 mg nightly.  Disc risk with Xanax  and separate doses. We discussed the short-term risks associated with benzodiazepines including sedation and increased fall risk among others.  Discussed long-term side effect risk including dependence, potential withdrawal symptoms, and the potential eventual dose-related risk of dementia.  But recent studies from 2020 dispute this association between benzodiazepines and dementia risk. Newer studies in 2020 do not support an association with dementia.  Options Latuda at 40 mg or above, off label memantine.  Plan: continue Med:  trazodone  150 HS & 2 tylenol  pm HS, alprazolam  0.5 mg or 1 mg HS and 0.5 mg prn anxiety, donepezil  10 HS, no Seroquel   DC Caplyta DT NR  Trial Namenda off label for cognitive concerns and off label ADD  Encourage getting out of house as  much as possible.  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  But caring for [name] is full time commitment.    FU 8 weeks  Lorene Macintosh, MD, DFAPA   Please see After Visit Summary for patient specific instructions.  Future Appointments  Date Time Provider Department Center  12/03/2024  9:30 AM Cottle, Lorene KANDICE Raddle., MD [facility] None          No orders of the defined types were placed in this encounter.      -------------------------------

## 2024-10-31 DIAGNOSIS — Z0001 Encounter for general adult medical examination with abnormal findings: Secondary | ICD-10-CM | POA: Diagnosis not present

## 2024-10-31 DIAGNOSIS — E119 Type 2 diabetes mellitus without complications: Secondary | ICD-10-CM | POA: Diagnosis not present

## 2024-10-31 DIAGNOSIS — E538 Deficiency of other specified B group vitamins: Secondary | ICD-10-CM | POA: Diagnosis not present

## 2024-10-31 DIAGNOSIS — E7849 Other hyperlipidemia: Secondary | ICD-10-CM | POA: Diagnosis not present

## 2024-10-31 DIAGNOSIS — I10 Essential (primary) hypertension: Secondary | ICD-10-CM | POA: Diagnosis not present

## 2024-10-31 DIAGNOSIS — E039 Hypothyroidism, unspecified: Secondary | ICD-10-CM | POA: Diagnosis not present

## 2024-10-31 DIAGNOSIS — D519 Vitamin B12 deficiency anemia, unspecified: Secondary | ICD-10-CM | POA: Diagnosis not present

## 2024-10-31 DIAGNOSIS — R5383 Other fatigue: Secondary | ICD-10-CM | POA: Diagnosis not present

## 2024-10-31 DIAGNOSIS — R7989 Other specified abnormal findings of blood chemistry: Secondary | ICD-10-CM | POA: Diagnosis not present

## 2024-11-07 ENCOUNTER — Other Ambulatory Visit: Payer: Self-pay

## 2024-11-07 DIAGNOSIS — R4582 Worries: Secondary | ICD-10-CM | POA: Diagnosis not present

## 2024-11-07 DIAGNOSIS — I10 Essential (primary) hypertension: Secondary | ICD-10-CM | POA: Diagnosis not present

## 2024-11-07 DIAGNOSIS — D519 Vitamin B12 deficiency anemia, unspecified: Secondary | ICD-10-CM | POA: Diagnosis not present

## 2024-11-07 DIAGNOSIS — K219 Gastro-esophageal reflux disease without esophagitis: Secondary | ICD-10-CM | POA: Diagnosis not present

## 2024-11-07 DIAGNOSIS — M19042 Primary osteoarthritis, left hand: Secondary | ICD-10-CM | POA: Diagnosis not present

## 2024-11-07 DIAGNOSIS — M5431 Sciatica, right side: Secondary | ICD-10-CM | POA: Diagnosis not present

## 2024-11-07 DIAGNOSIS — N3943 Post-void dribbling: Secondary | ICD-10-CM | POA: Diagnosis not present

## 2024-11-07 DIAGNOSIS — R7989 Other specified abnormal findings of blood chemistry: Secondary | ICD-10-CM

## 2024-11-07 DIAGNOSIS — M19041 Primary osteoarthritis, right hand: Secondary | ICD-10-CM | POA: Diagnosis not present

## 2024-11-07 DIAGNOSIS — I700 Atherosclerosis of aorta: Secondary | ICD-10-CM | POA: Diagnosis not present

## 2024-11-13 DIAGNOSIS — R748 Abnormal levels of other serum enzymes: Secondary | ICD-10-CM | POA: Diagnosis not present

## 2024-11-19 DIAGNOSIS — H9212 Otorrhea, left ear: Secondary | ICD-10-CM | POA: Diagnosis not present

## 2024-11-19 DIAGNOSIS — H60332 Swimmer's ear, left ear: Secondary | ICD-10-CM | POA: Diagnosis not present

## 2024-11-19 DIAGNOSIS — J300 Vasomotor rhinitis: Secondary | ICD-10-CM | POA: Diagnosis not present

## 2024-11-22 LAB — CBC WITH DIFFERENTIAL/PLATELET
Basophils Absolute: 0 x10E3/uL (ref 0.0–0.2)
Basos: 0 %
EOS (ABSOLUTE): 0.1 x10E3/uL (ref 0.0–0.4)
Eos: 2 %
Hematocrit: 48.3 % (ref 37.5–51.0)
Hemoglobin: 15.9 g/dL (ref 13.0–17.7)
Immature Grans (Abs): 0 x10E3/uL (ref 0.0–0.1)
Immature Granulocytes: 0 %
Lymphocytes Absolute: 1.3 x10E3/uL (ref 0.7–3.1)
Lymphs: 22 %
MCH: 30.5 pg (ref 26.6–33.0)
MCHC: 32.9 g/dL (ref 31.5–35.7)
MCV: 93 fL (ref 79–97)
Monocytes Absolute: 0.6 x10E3/uL (ref 0.1–0.9)
Monocytes: 9 %
Neutrophils Absolute: 4 x10E3/uL (ref 1.4–7.0)
Neutrophils: 67 %
Platelets: 174 x10E3/uL (ref 150–450)
RBC: 5.22 x10E6/uL (ref 4.14–5.80)
RDW: 13.1 % (ref 11.6–15.4)
WBC: 6 x10E3/uL (ref 3.4–10.8)

## 2024-11-22 LAB — PROTIME-INR
INR: 1 (ref 0.9–1.2)
Prothrombin Time: 10.6 s (ref 9.1–12.0)

## 2024-11-22 LAB — COMPREHENSIVE METABOLIC PANEL WITH GFR
ALT: 24 IU/L (ref 0–44)
AST: 23 IU/L (ref 0–40)
Albumin: 4.3 g/dL (ref 3.8–4.8)
Alkaline Phosphatase: 115 IU/L (ref 47–123)
BUN/Creatinine Ratio: 18 (ref 10–24)
BUN: 17 mg/dL (ref 8–27)
Bilirubin Total: 0.5 mg/dL (ref 0.0–1.2)
CO2: 25 mmol/L (ref 20–29)
Calcium: 9.2 mg/dL (ref 8.6–10.2)
Chloride: 102 mmol/L (ref 96–106)
Creatinine, Ser: 0.94 mg/dL (ref 0.76–1.27)
Globulin, Total: 2.4 g/dL (ref 1.5–4.5)
Glucose: 180 mg/dL — ABNORMAL HIGH (ref 70–99)
Potassium: 4.3 mmol/L (ref 3.5–5.2)
Sodium: 142 mmol/L (ref 134–144)
Total Protein: 6.7 g/dL (ref 6.0–8.5)
eGFR: 86 mL/min/1.73 (ref >59)

## 2024-11-22 LAB — ENHANCED LIVER FIBROSIS (ELF): ELF(TM) Score: 10.88 — AB (ref <9.80)

## 2024-11-23 ENCOUNTER — Ambulatory Visit: Payer: Self-pay | Admitting: Internal Medicine

## 2024-11-27 DIAGNOSIS — L304 Erythema intertrigo: Secondary | ICD-10-CM | POA: Diagnosis not present

## 2024-11-27 DIAGNOSIS — X32XXXD Exposure to sunlight, subsequent encounter: Secondary | ICD-10-CM | POA: Diagnosis not present

## 2024-11-27 DIAGNOSIS — L308 Other specified dermatitis: Secondary | ICD-10-CM | POA: Diagnosis not present

## 2024-11-27 DIAGNOSIS — L570 Actinic keratosis: Secondary | ICD-10-CM | POA: Diagnosis not present

## 2024-11-28 ENCOUNTER — Other Ambulatory Visit: Payer: Self-pay | Admitting: Psychiatry

## 2024-11-28 DIAGNOSIS — F5105 Insomnia due to other mental disorder: Secondary | ICD-10-CM

## 2024-12-03 ENCOUNTER — Ambulatory Visit (INDEPENDENT_AMBULATORY_CARE_PROVIDER_SITE_OTHER): Admitting: Psychiatry

## 2024-12-03 ENCOUNTER — Encounter: Payer: Self-pay | Admitting: Psychiatry

## 2024-12-03 DIAGNOSIS — F5105 Insomnia due to other mental disorder: Secondary | ICD-10-CM | POA: Diagnosis not present

## 2024-12-03 DIAGNOSIS — F09 Unspecified mental disorder due to known physiological condition: Secondary | ICD-10-CM

## 2024-12-03 DIAGNOSIS — F341 Dysthymic disorder: Secondary | ICD-10-CM

## 2024-12-03 DIAGNOSIS — F902 Attention-deficit hyperactivity disorder, combined type: Secondary | ICD-10-CM

## 2024-12-03 DIAGNOSIS — F331 Major depressive disorder, recurrent, moderate: Secondary | ICD-10-CM

## 2024-12-03 DIAGNOSIS — F411 Generalized anxiety disorder: Secondary | ICD-10-CM | POA: Diagnosis not present

## 2024-12-03 MED ORDER — MEMANTINE HCL 10 MG PO TABS: 10.0000 mg | ORAL_TABLET | Freq: Two times a day (BID) | ORAL | 1 refills | Status: AC

## 2024-12-03 MED ORDER — ALPRAZOLAM 0.5 MG PO TABS: ORAL_TABLET | ORAL | 3 refills | Status: AC
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
## 2024-12-06 ENCOUNTER — Other Ambulatory Visit: Payer: Self-pay | Admitting: Internal Medicine

## 2024-12-09 NOTE — Telephone Encounter (Signed)
 Fibroscn f1-f2 - low risk of advanced liver diseas. S3 steaotosis - generous fat in liver; rec :  wt loss, good glucose control, regular exercise, curtail alcohol ; repeat fibroscan in 1 year

## 2025-03-05 ENCOUNTER — Ambulatory Visit: Admitting: Psychiatry
